# Patient Record
Sex: Female | Born: 1965
Health system: Southern US, Community
[De-identification: ages and names within clinical notes are randomized; demographics above are authoritative.]

## PROBLEM LIST (undated history)

## (undated) DIAGNOSIS — G709 Myoneural disorder, unspecified: Secondary | ICD-10-CM

## (undated) DIAGNOSIS — R87619 Unspecified abnormal cytological findings in specimens from cervix uteri: Secondary | ICD-10-CM

## (undated) DIAGNOSIS — IMO0002 Reserved for concepts with insufficient information to code with codable children: Secondary | ICD-10-CM

## (undated) DIAGNOSIS — Z8619 Personal history of other infectious and parasitic diseases: Secondary | ICD-10-CM

## (undated) DIAGNOSIS — B379 Candidiasis, unspecified: Secondary | ICD-10-CM

## (undated) DIAGNOSIS — A749 Chlamydial infection, unspecified: Secondary | ICD-10-CM

## (undated) DIAGNOSIS — B009 Herpesviral infection, unspecified: Secondary | ICD-10-CM

## (undated) DIAGNOSIS — M1990 Unspecified osteoarthritis, unspecified site: Secondary | ICD-10-CM

## (undated) DIAGNOSIS — G35 Multiple sclerosis: Secondary | ICD-10-CM

## (undated) HISTORY — DX: Myoneural disorder, unspecified: G70.9

## (undated) HISTORY — PX: INCISION AND DRAINAGE ABSCESS: SHX5864

## (undated) HISTORY — PX: TUBAL LIGATION: SHX77

## (undated) HISTORY — DX: Personal history of other infectious and parasitic diseases: Z86.19

## (undated) HISTORY — PX: CARPAL TUNNEL RELEASE: SHX101

## (undated) HISTORY — DX: Herpesviral infection, unspecified: B00.9

## (undated) HISTORY — DX: Candidiasis, unspecified: B37.9

## (undated) HISTORY — DX: Reserved for concepts with insufficient information to code with codable children: IMO0002

## (undated) HISTORY — DX: Chlamydial infection, unspecified: A74.9

## (undated) HISTORY — DX: Multiple sclerosis: G35

## (undated) HISTORY — DX: Unspecified abnormal cytological findings in specimens from cervix uteri: R87.619

---

## 1997-07-19 ENCOUNTER — Ambulatory Visit (HOSPITAL_COMMUNITY): Admission: RE | Admit: 1997-07-19 | Discharge: 1997-07-19 | Payer: Self-pay | Admitting: *Deleted

## 1998-01-11 ENCOUNTER — Ambulatory Visit (HOSPITAL_COMMUNITY): Admission: RE | Admit: 1998-01-11 | Discharge: 1998-01-11 | Payer: Self-pay | Admitting: *Deleted

## 1998-08-18 ENCOUNTER — Other Ambulatory Visit: Admission: RE | Admit: 1998-08-18 | Discharge: 1998-08-18 | Payer: Self-pay | Admitting: *Deleted

## 1999-08-21 ENCOUNTER — Other Ambulatory Visit: Admission: RE | Admit: 1999-08-21 | Discharge: 1999-08-21 | Payer: Self-pay | Admitting: *Deleted

## 2000-09-16 ENCOUNTER — Other Ambulatory Visit: Admission: RE | Admit: 2000-09-16 | Discharge: 2000-09-16 | Payer: Self-pay | Admitting: *Deleted

## 2000-11-07 ENCOUNTER — Other Ambulatory Visit: Admission: RE | Admit: 2000-11-07 | Discharge: 2000-11-07 | Payer: Self-pay | Admitting: *Deleted

## 2000-12-18 ENCOUNTER — Other Ambulatory Visit: Admission: RE | Admit: 2000-12-18 | Discharge: 2000-12-18 | Payer: Self-pay | Admitting: *Deleted

## 2003-10-27 ENCOUNTER — Inpatient Hospital Stay (HOSPITAL_COMMUNITY): Admission: RE | Admit: 2003-10-27 | Discharge: 2003-10-27 | Payer: Self-pay | Admitting: Obstetrics and Gynecology

## 2003-11-18 ENCOUNTER — Ambulatory Visit (HOSPITAL_COMMUNITY): Admission: RE | Admit: 2003-11-18 | Discharge: 2003-11-18 | Payer: Self-pay | Admitting: Obstetrics and Gynecology

## 2004-01-04 ENCOUNTER — Ambulatory Visit (HOSPITAL_COMMUNITY): Admission: RE | Admit: 2004-01-04 | Discharge: 2004-01-04 | Payer: Self-pay | Admitting: Obstetrics and Gynecology

## 2004-01-21 ENCOUNTER — Inpatient Hospital Stay (HOSPITAL_COMMUNITY): Admission: RE | Admit: 2004-01-21 | Discharge: 2004-01-21 | Payer: Self-pay | Admitting: Obstetrics and Gynecology

## 2004-01-23 ENCOUNTER — Inpatient Hospital Stay (HOSPITAL_COMMUNITY): Admission: RE | Admit: 2004-01-23 | Discharge: 2004-01-27 | Payer: Self-pay | Admitting: Obstetrics and Gynecology

## 2004-02-17 ENCOUNTER — Ambulatory Visit (HOSPITAL_COMMUNITY): Admission: RE | Admit: 2004-02-17 | Discharge: 2004-02-17 | Payer: Self-pay | Admitting: Obstetrics and Gynecology

## 2004-02-24 ENCOUNTER — Ambulatory Visit (HOSPITAL_COMMUNITY): Admission: RE | Admit: 2004-02-24 | Discharge: 2004-02-24 | Payer: Self-pay | Admitting: Obstetrics and Gynecology

## 2004-03-02 ENCOUNTER — Ambulatory Visit (HOSPITAL_COMMUNITY): Admission: RE | Admit: 2004-03-02 | Discharge: 2004-03-02 | Payer: Self-pay | Admitting: Obstetrics and Gynecology

## 2004-03-16 ENCOUNTER — Ambulatory Visit (HOSPITAL_COMMUNITY): Admission: RE | Admit: 2004-03-16 | Discharge: 2004-03-16 | Payer: Self-pay | Admitting: Obstetrics and Gynecology

## 2004-04-01 ENCOUNTER — Inpatient Hospital Stay (HOSPITAL_COMMUNITY): Admission: AD | Admit: 2004-04-01 | Discharge: 2004-04-04 | Payer: Self-pay | Admitting: Obstetrics and Gynecology

## 2004-05-15 ENCOUNTER — Other Ambulatory Visit: Admission: RE | Admit: 2004-05-15 | Discharge: 2004-05-15 | Payer: Self-pay | Admitting: Obstetrics and Gynecology

## 2004-08-06 ENCOUNTER — Encounter: Admission: RE | Admit: 2004-08-06 | Discharge: 2004-08-06 | Payer: Self-pay | Admitting: Neurology

## 2004-11-11 ENCOUNTER — Emergency Department (HOSPITAL_COMMUNITY): Admission: EM | Admit: 2004-11-11 | Discharge: 2004-11-11 | Payer: Self-pay | Admitting: Emergency Medicine

## 2004-11-24 ENCOUNTER — Encounter: Admission: RE | Admit: 2004-11-24 | Discharge: 2004-12-19 | Payer: Self-pay | Admitting: Orthopedic Surgery

## 2004-12-26 ENCOUNTER — Encounter: Admission: RE | Admit: 2004-12-26 | Discharge: 2005-02-09 | Payer: Self-pay | Admitting: Orthopedic Surgery

## 2005-05-29 ENCOUNTER — Other Ambulatory Visit: Admission: RE | Admit: 2005-05-29 | Discharge: 2005-05-29 | Payer: Self-pay | Admitting: Obstetrics and Gynecology

## 2005-06-12 ENCOUNTER — Encounter: Admission: RE | Admit: 2005-06-12 | Discharge: 2005-06-12 | Payer: Self-pay | Admitting: Internal Medicine

## 2005-10-10 IMAGING — US US FETAL BPP W/O NONSTRESS
1 series · 13 of 28 positions shown · non-contrast
Comparison: none

CLINICAL DATA: Assess growth and fetal well-being.

[Series 1: us fetal bpp w/o nonstress · 0.33mm/px · 13 of 55 slices shown]
[im 3/55]
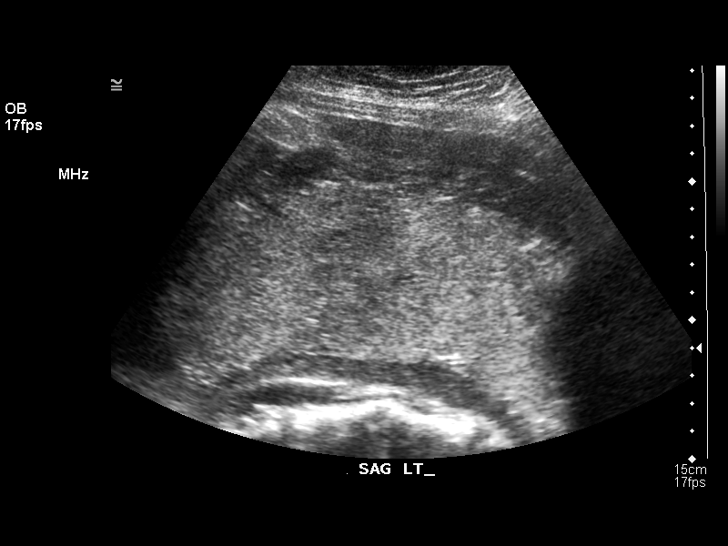
[im 7/55]
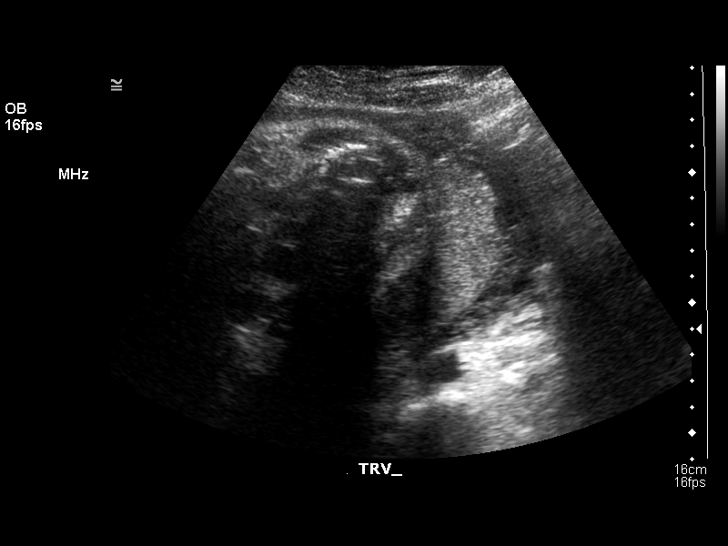
[im 11/55]
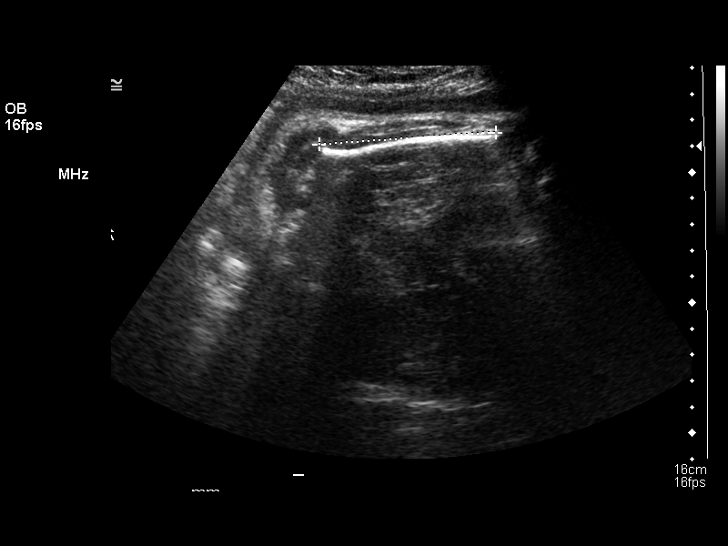
[im 15/55]
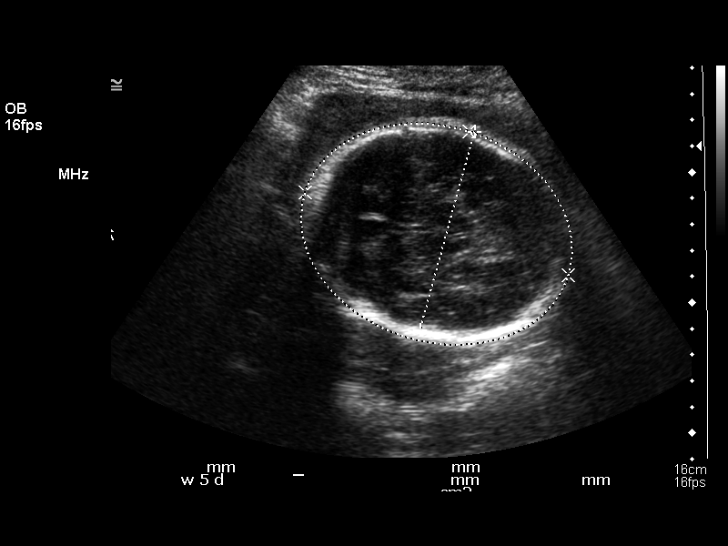
[im 19/55]
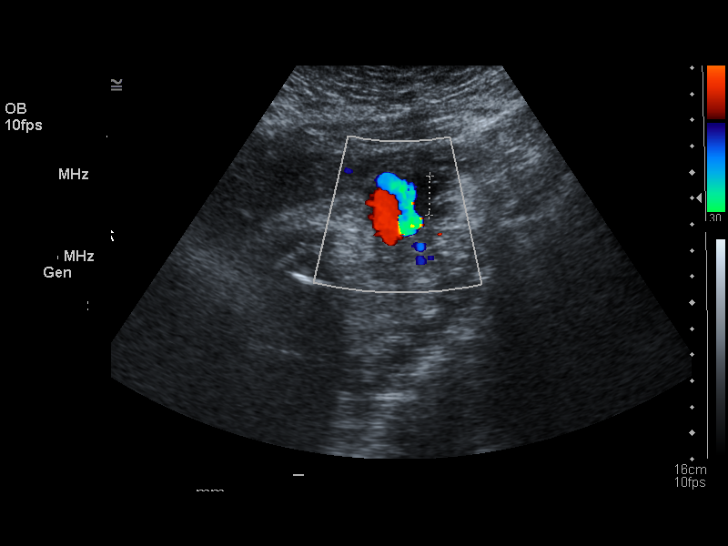
[im 23/55]
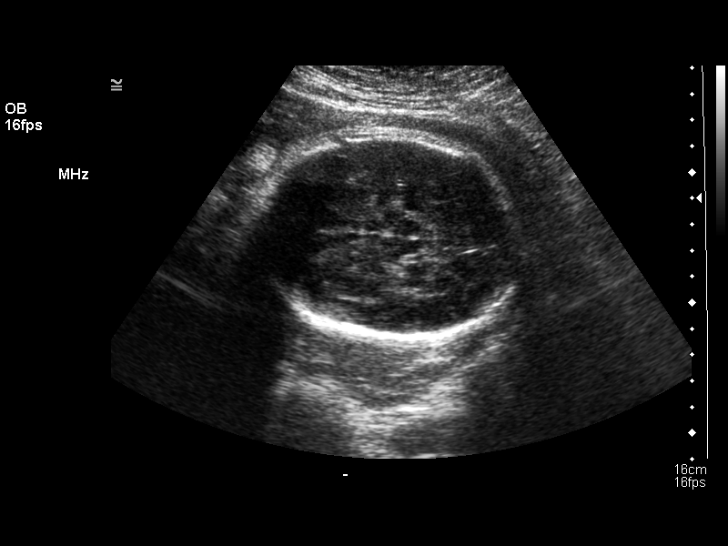
[im 29/55]
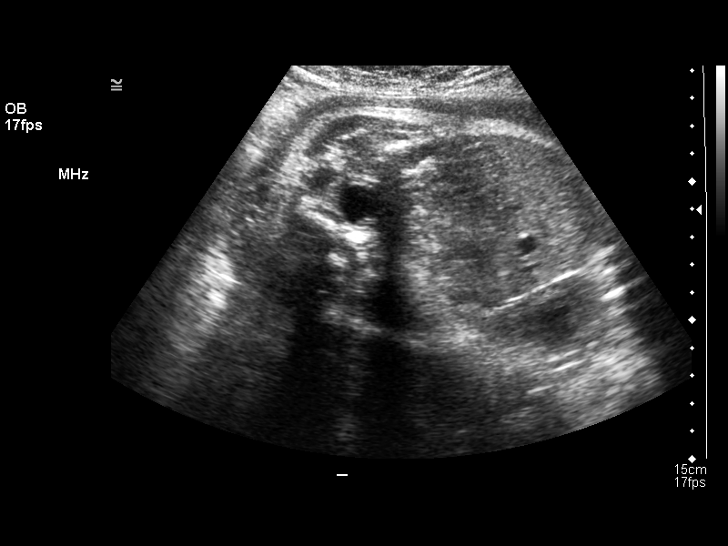
[im 33/55]
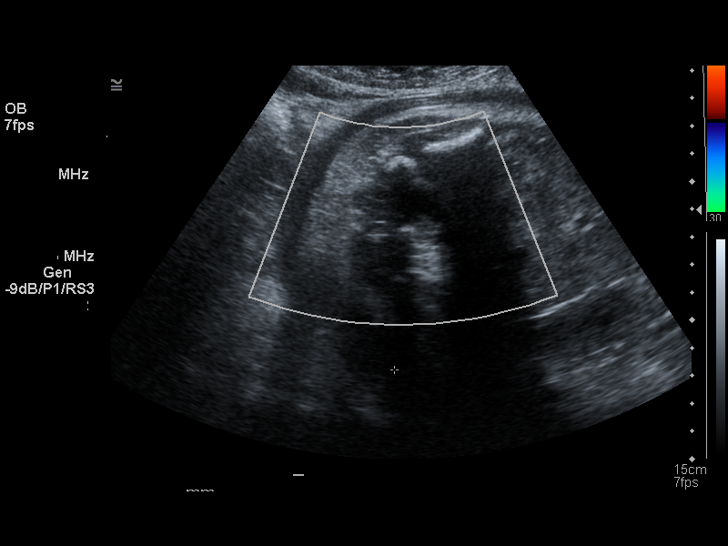
[im 37/55]
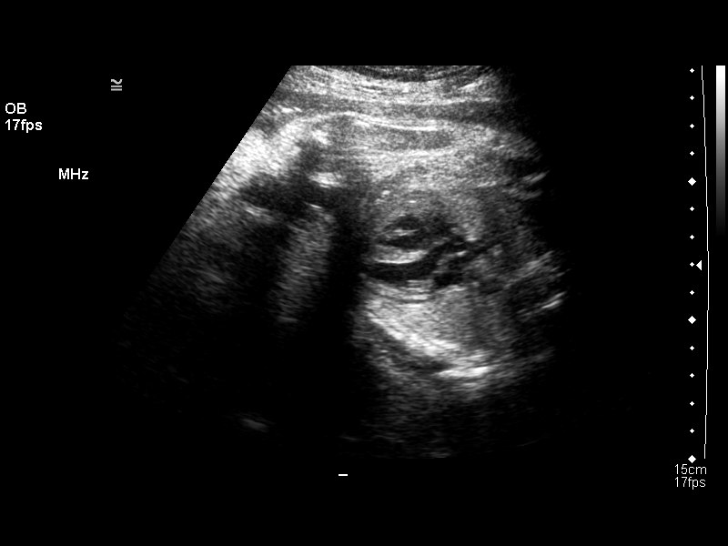
[im 41/55]
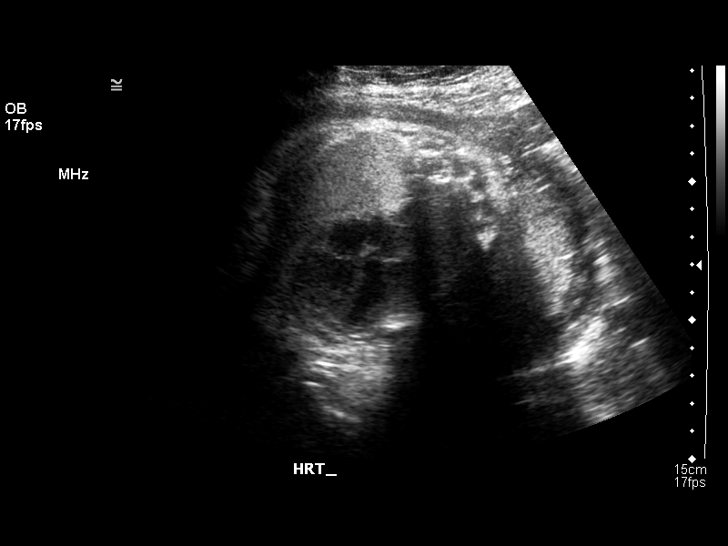
[im 45/55]
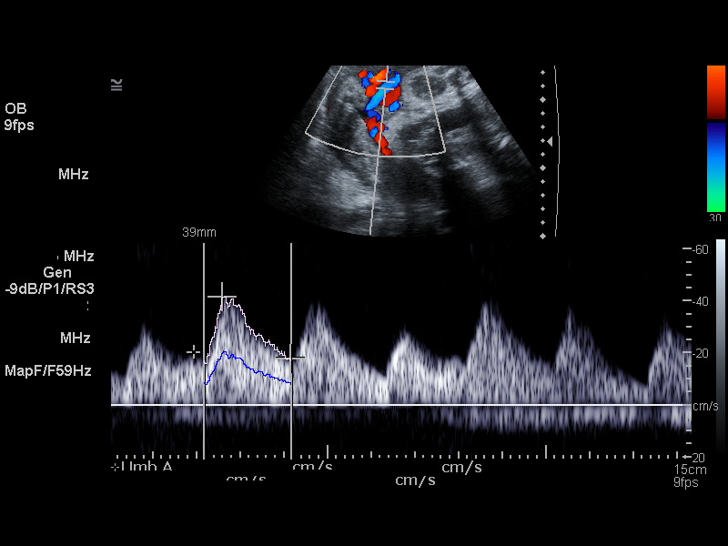
[im 49/55]
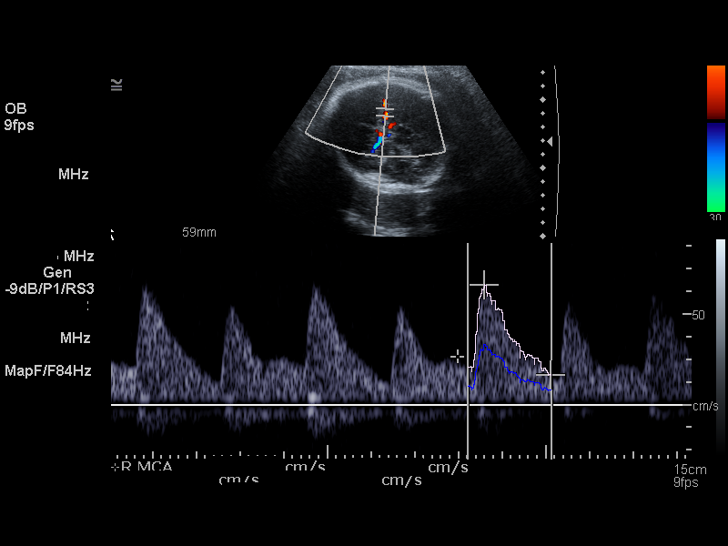
[im 53/55]
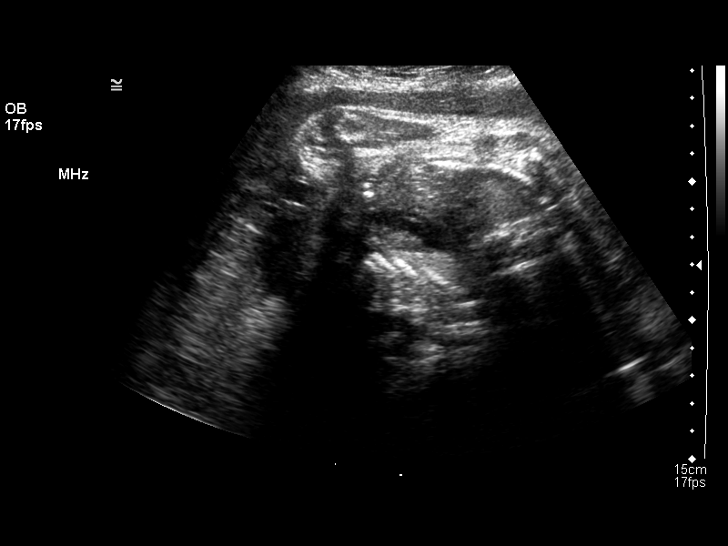

[13 of 28 positions shown; findings below may reference images not displayed]

OBSTETRICAL ULTRASOUND RE-EVALUATION:
Number of Fetuses: 1
Heart Rate:  141
Movement:  Yes
Breathing:  Yes
Presentation:  Cephalic
Placental Location:  Posterior
Grade:  II
Previa:  No
Amniotic Fluid (subjective):  Decreased
Amniotic Fluid (objective):  4.2 cm AFI (5th -95th%ile = 8.5 ? 24.5 cm for 33 wks)

FETAL BIOMETRY
BPD:  7.8 cm   31 w 4 d
HC:  29.6 cm   32 w 6 d
AC:  28.9 cm  32 w 6 d
FL:  6.7 cm   34 w 6 d

Mean GA:  33 w 0 d
Assigned GA:  32 w 4 d
Fetal indices are within normal limits.
EFW:  2262 g (H) 75th ? 90th%ile (7673 ? 4147 g) for 33 wks

FETAL ANATOMY
Lateral Ventricles:  Visualized 
Thalami/CSP:  Visualized 
Posterior Fossa:  Visualized 
Nuchal Region:  Previously seen  
Spine:  Previously seen 
4 Chamber Heart on Left:  Visualized 
Stomach on Left:  Visualized 
3 Vessel Cord:  Previously seen 
Cord Insertion Site:  Previously seen 
Kidneys:  Visualized 
Bladder:  Visualized 
Extremities:  Previously seen 

MATERNAL UTERINE AND ADNEXAL FINDINGS
Cervix:  3.5 cm Transabdominally

BIOPHYSICAL PROFILE

Movement:  2    Time:  25 minutes
Breathing:  2
Tone:  
Amniotic Fluid:  2

Total Score:  8

DOPPLER ULTRASOUND OF FETUS:

Umbilical Artery S/D Ratio:  2.26    (NL< 3.70)

Middle Cerebral Artery PI: 1.61     (NL> 1.40)
IMPRESSION: 1.  Single intrauterine pregnancy demonstrating an estimated gestational age by ultrasound 33 weeks 0 days.  Correlation with expected estimated gestational age by LMP of 32 weeks and 4 days suggests appropriate growth.  Currently the estimated fetal weight is between the 75th and 90th percentile for a 33 week gestation.
2.  Subjectively and quantitatively decreased amniotic fluid volume with an amniotic fluid index today measured at 4.2 cm (8.5 ? 24.5 cm).  Because of the low amniotic fluid volume with no clinical suspicion of ruptured membranes, Doppler evaluation was performed.
3.  Biophysical profile score [DATE].  
4.  Normal umbilical artery and middle cerebral artery Doppler assessment.  No absence or reversal of end diastolic flow was noted on any of the interrogated strips.  
5.  Normal cervical length.
6.    No late developing fetal anatomic abnormalities are seen associated with the lateral ventricles, four chamber heart, stomach, kidneys or bladder.

## 2005-10-17 IMAGING — US US FETAL BPP W/O NONSTRESS
1 series · 13 of 21 positions shown · non-contrast
Comparison: none

CLINICAL DATA: Oligohydramnios of unknown etiology.  Assess fetal well being and amniotic fluid volume.

[Series 1: us fetal bpp w/o nonstress · 0.47mm/px · 13 of 21 slices shown]
[im 1/21]
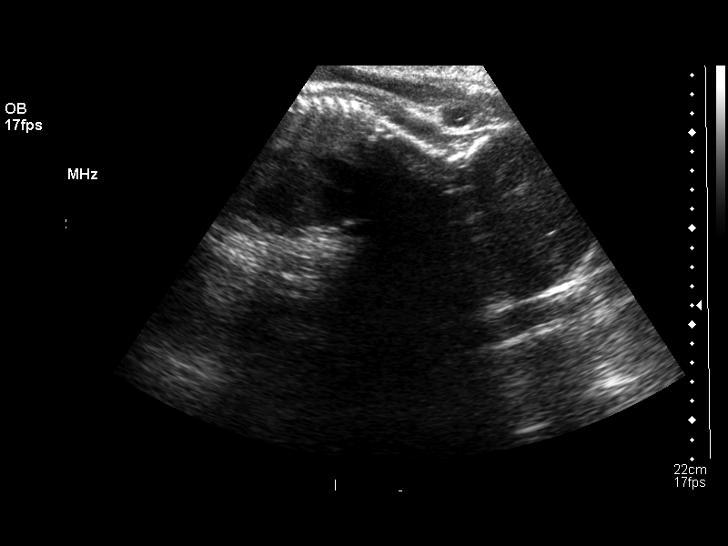
[im 3/21]
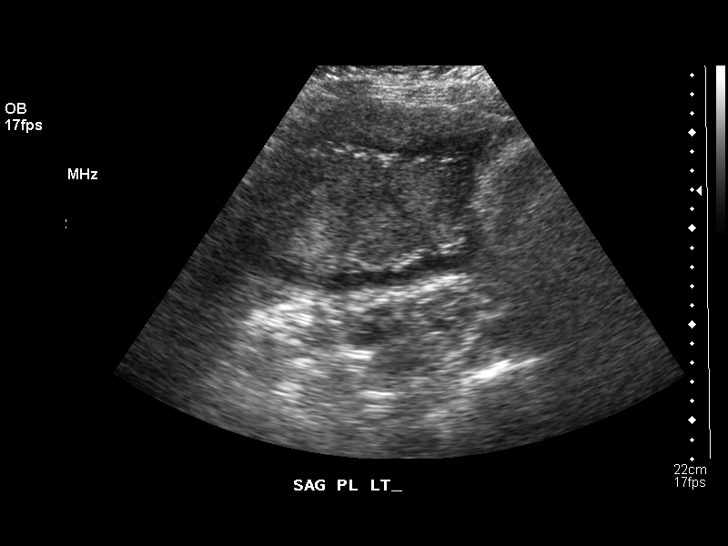
[im 5/21]
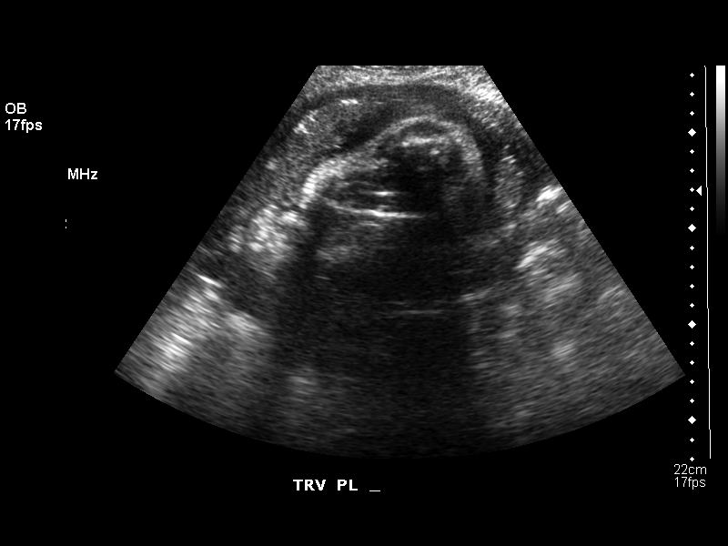
[im 6/21]
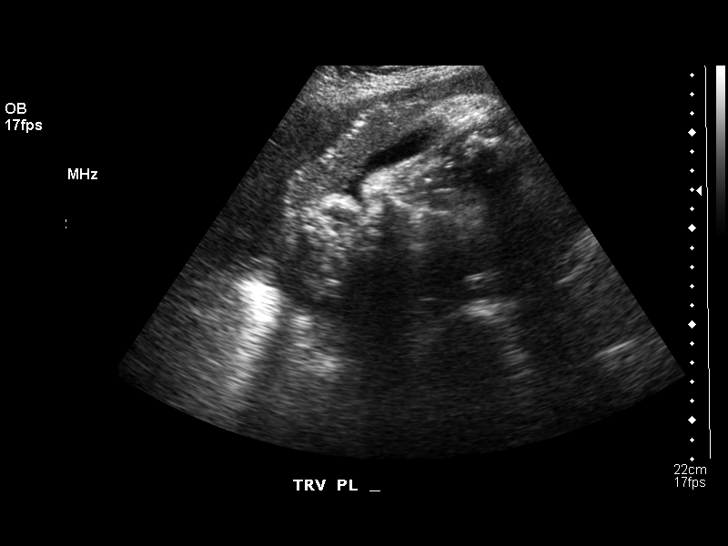
[im 8/21]
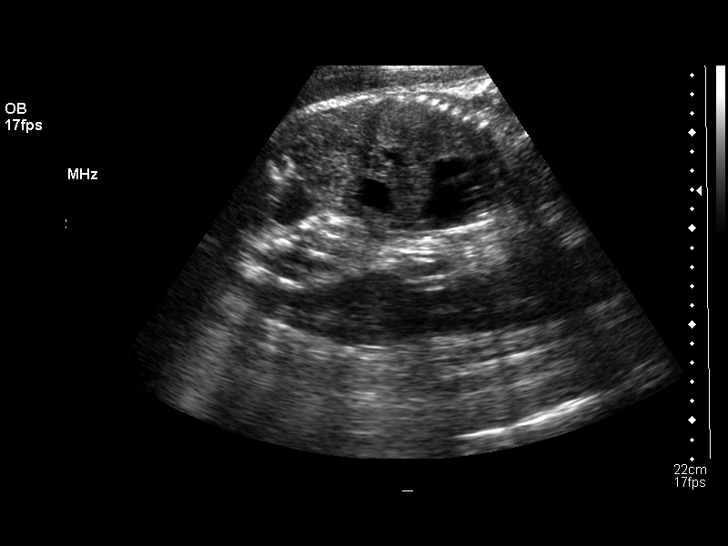
[im 9/21]
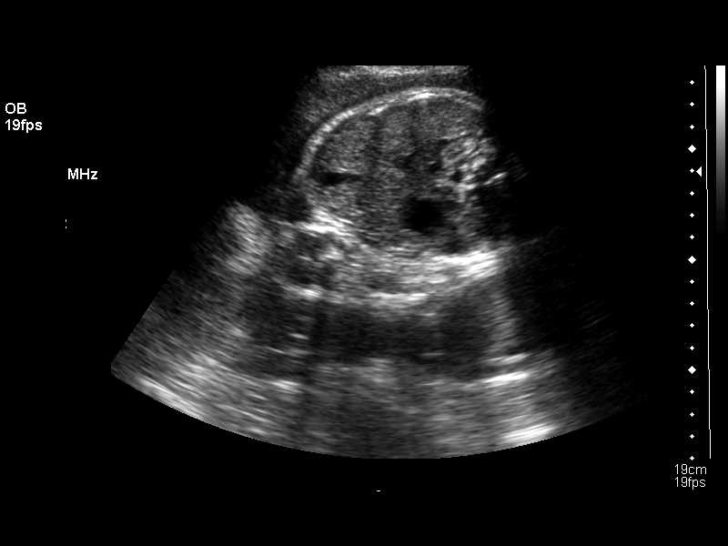
[im 11/21]
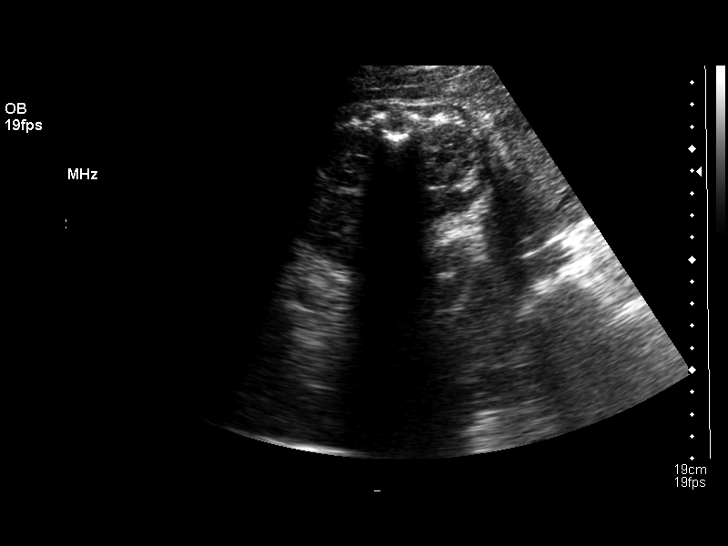
[im 13/21]
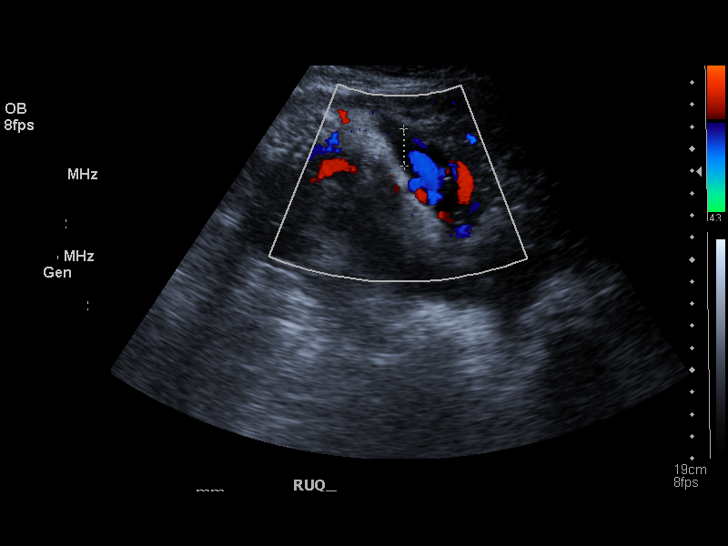
[im 14/21]
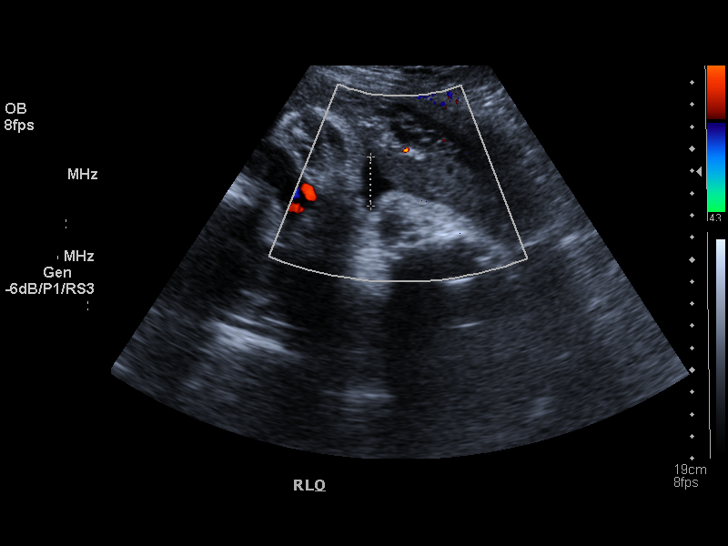
[im 16/21]
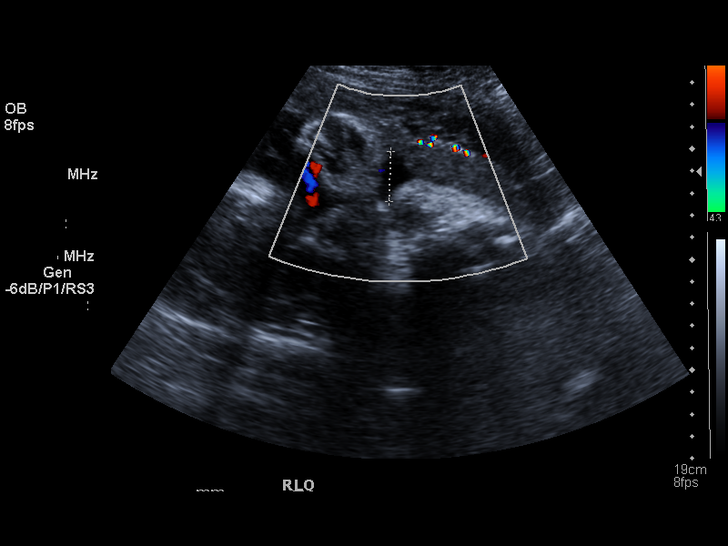
[im 17/21]
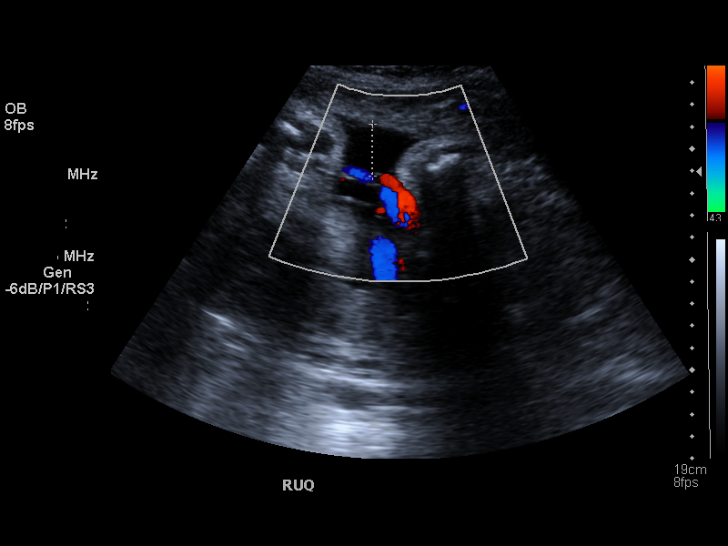
[im 19/21]
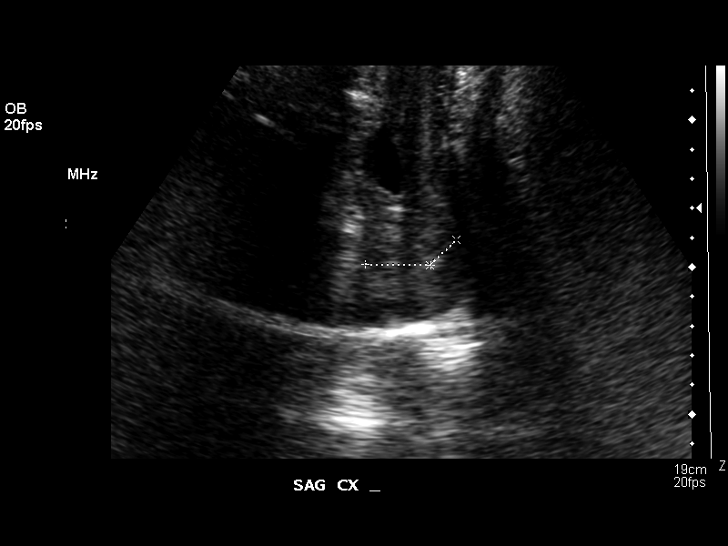
[im 21/21]
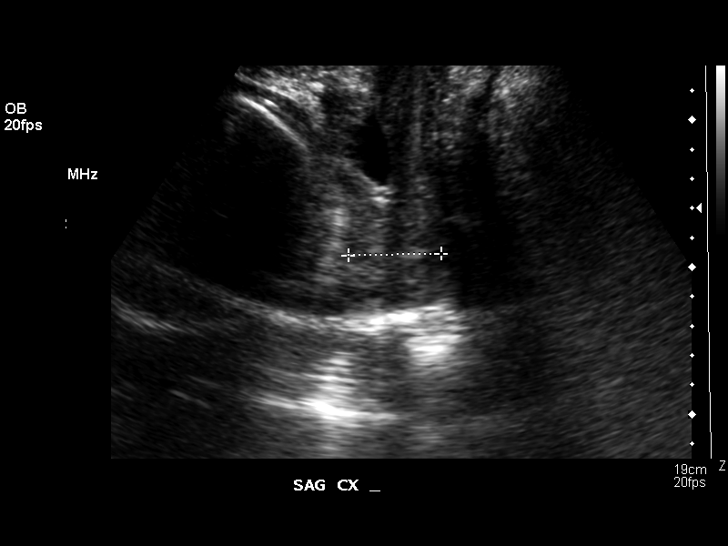

[13 of 21 positions shown; findings below may reference images not displayed]

BIOPHYSICAL PROFILE

Number of Fetuses: 1
Heart rate: 153 bpm
Movement: yes
Breathing: yes
Presentation: vertex
Placental Location: anterior, posterior. Noted is an anterior succurinate lobe.
Grade: II
Previa: no
Amniotic Fluid (Subjective): decreased
Amniotic Fluid (Objective): AFI 7.0 cm (2th-72th %ile = 7.9 to 24.9 cm for 35 weeks) 

Fetal measurements and complete anatomic evaluation were not requested.  The following fetal anatomy was visualized on this exam: stomach, kidneys, bladder, and diaphragm.

BPP SCORING
Movements:  2  Time:  15 minutes
Breathing:  2
Tone:  2
Amniotic Fluid:  2
Total Score:  8

MATERNAL UTERINE AND ADNEXAL FINDINGS
Cervix: 3.3 cm translabially
IMPRESSION: 1.  Subjectively and quantitatively decreased amniotic fluid volume.  Today, the amniotic fluid index measures 7 cm and this represents an interval improvement in overall fluid volume.  On the previous exam of 02/24/04, the amniotic fluid volume measured 4.2 cm.  
2.  Biophysical profile score [DATE].
3.  Normal cervical length.
4.  No late-developing fetal anatomic abnormalities are seen associated with the stomach, kidneys, or bladder.  Four chamber heart view and lateral ventricles could not be assessed due to positioning.

5. Anterior succurinate placental lobe.

## 2005-10-31 IMAGING — US US OB FOLLOW-UP
1 series · 13 of 21 positions shown · non-contrast
Comparison: none

CLINICAL DATA: 36 week 6 day assigned gestational age.  Follow-up oligohydramnios.  Evaluate fetal growth and biophysical profile.

[Series 1: us ob follow-up · 0.43mm/px · 13 of 21 slices shown]
[im 1/21]
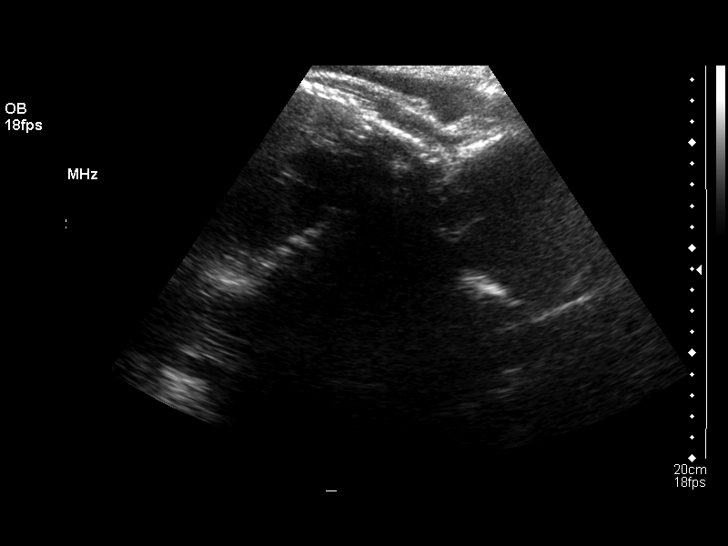
[im 3/21]
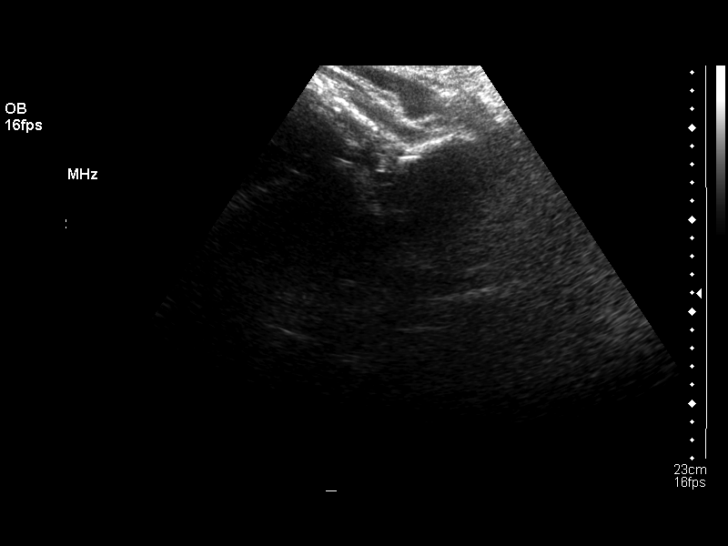
[im 5/21]
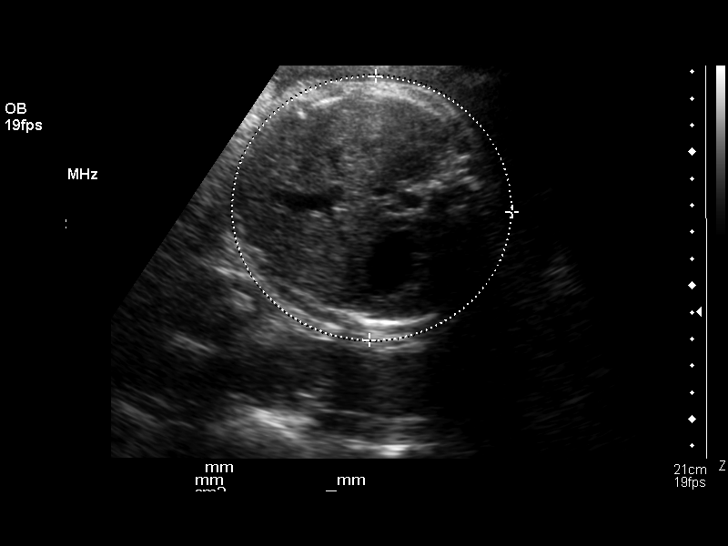
[im 6/21]
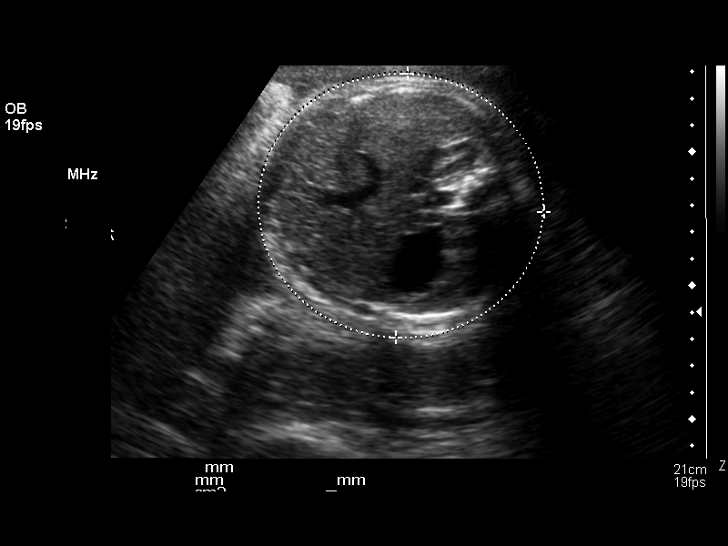
[im 8/21]
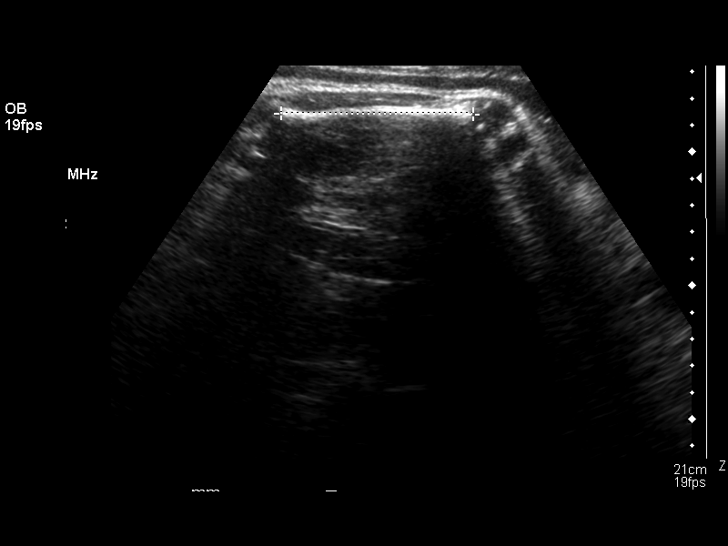
[im 9/21]
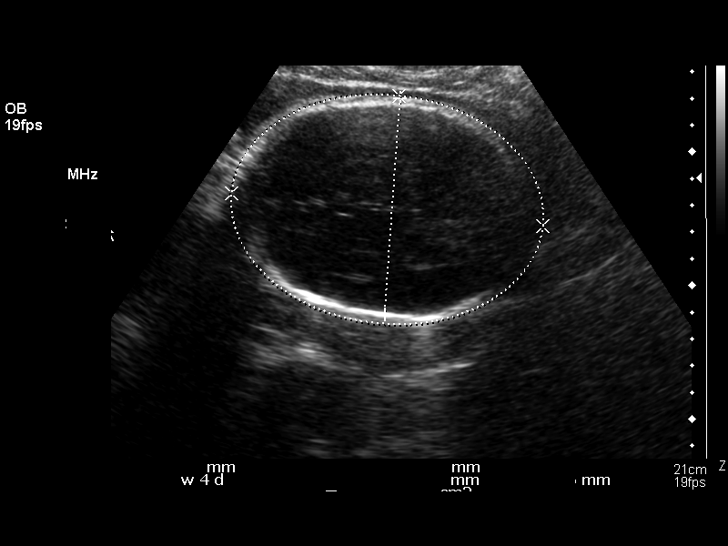
[im 11/21]
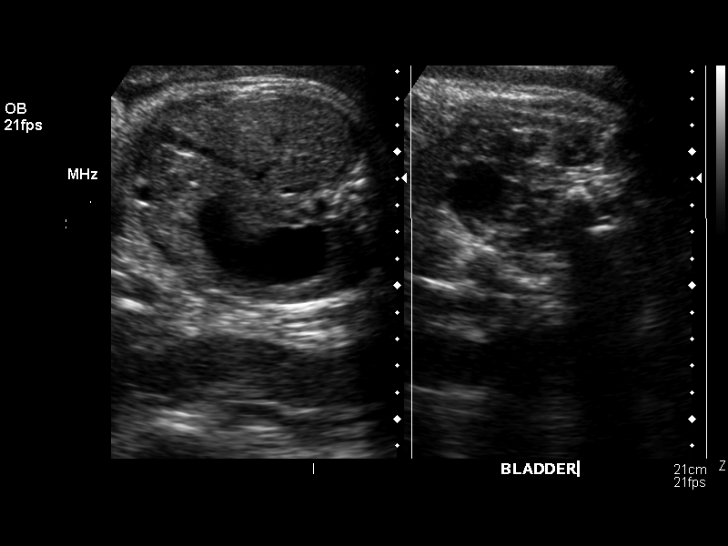
[im 13/21]
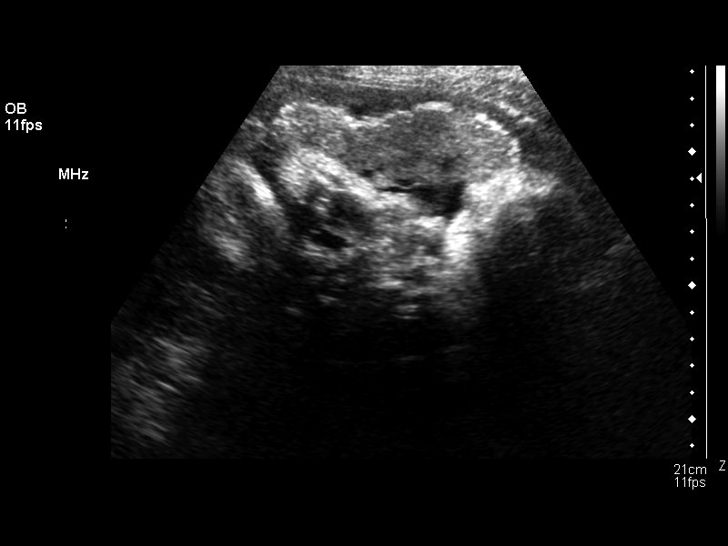
[im 14/21]
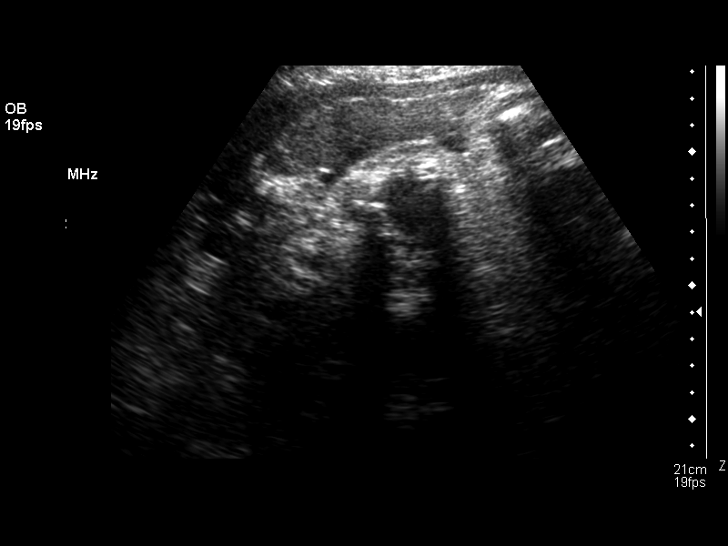
[im 16/21]
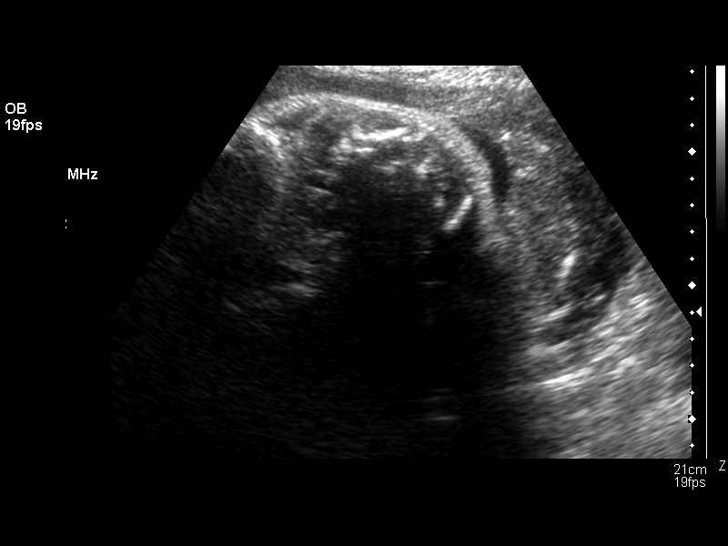
[im 17/21]
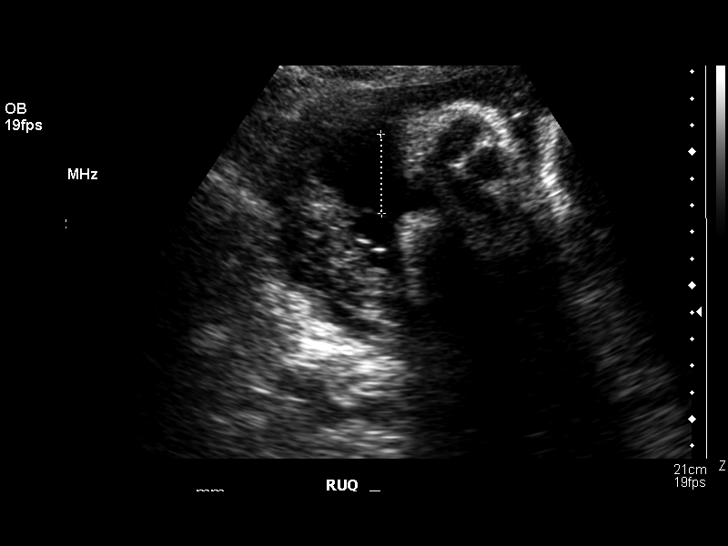
[im 19/21]
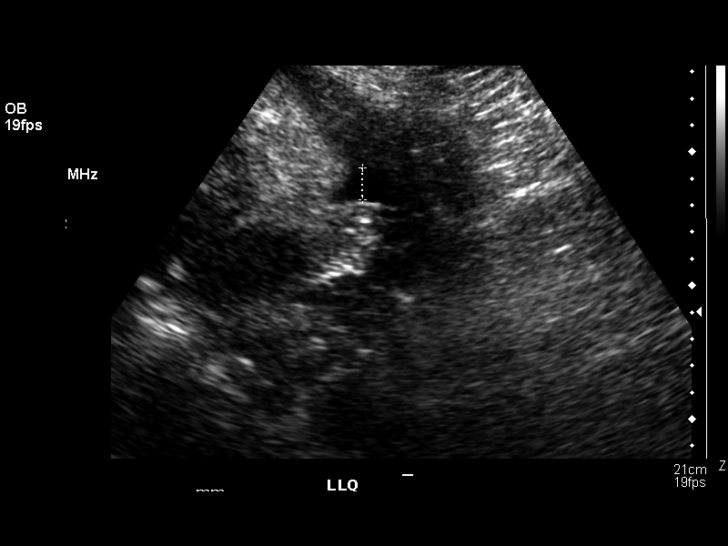
[im 21/21]
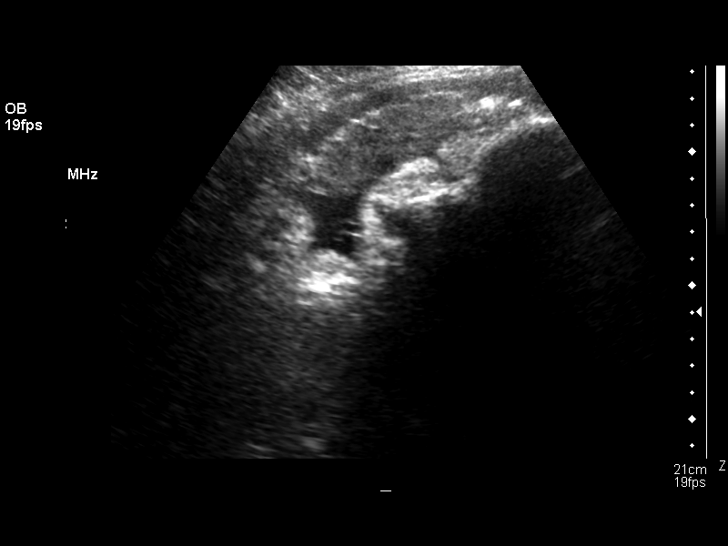

[13 of 21 positions shown; findings below may reference images not displayed]

OBSTETRICAL ULTRASOUND RE-EVALUATION:
 Number of Fetuses: 1
 Heart Rate:  153
 Movement:  Yes
 Breathing:  Yes
 Presentation:  Cephalic
 Placental Location:  Anterior/posterior
 Grade:  II
 Previa:  No
 Amniotic Fluid (subjective):  Decreased
 Amniotic Fluid (objective):  8.8 cm AFI (5th -95th%ile =   7.5 – 24.4 cm for 37 wks)

 FETAL BIOMETRY
 BPD:  8.1 cm   32 w 3 d
 HC:  32.0 cm   36 w 1 d
 AC:  32.4 cm   36 w 2 d
 FL:  7.2 cm   36 w 5 d

 Mean GA:  35 w 3 d
 Assigned GA:  36 w 6 d

 EFW:  3393 g (H) 50th – 75th%ile (9645 = 0544 g) For 37 wks

 FETAL ANATOMY
 Lateral Ventricles:  Previously seen 
 Thalami/CSP:  Previously seen 
 Posterior Fossa:  Previously seen 
 Nuchal Region:  Previously seen 
 Spine:  Previously seen 
 4 Chamber Heart on Left:  Previously seen 
 Stomach on Left:  Visualized 
 3 Vessel Cord:  Previously seen 
 Cord Insertion Site:  Previously seen 
 Kidneys:  Visualized 
 Bladder:  Visualized 
 Extremities:  Previously seen 

 MATERNAL UTERINE AND ADNEXAL FINDINGS
 Cervix:  Not evaluated 
 Again noted is an succenturiate lobe of the placenta along the anterior uterine wall.
IMPRESSION: 1.  Assigned gestational age is currently 36 weeks 6 days.  Appropriate fetal growth, with EFW slightly greater than 50th percentile.  
 2. Amniotic fluid volume remains subjectively decreased, with AFI of 8.8 cm on today’s study.  This shows little change since prior study on 03/02/04.
 3.  Anterior succenturiate lobe of the placenta again noted.

 BIOPHYSICAL PROFILE

 Movement:  2    Time:  15 minutes
 Breathing:  2
 Tone:  2
 Amniotic Fluid:  2

 Total Score:  8
IMPRESSION: Biophysical profile score [DATE].

## 2005-11-17 ENCOUNTER — Emergency Department (HOSPITAL_COMMUNITY): Admission: EM | Admit: 2005-11-17 | Discharge: 2005-11-17 | Payer: Self-pay | Admitting: Emergency Medicine

## 2010-02-11 ENCOUNTER — Encounter: Payer: Self-pay | Admitting: Family Medicine

## 2010-02-11 ENCOUNTER — Encounter: Payer: Self-pay | Admitting: Obstetrics and Gynecology

## 2010-06-26 ENCOUNTER — Other Ambulatory Visit: Payer: Self-pay | Admitting: Internal Medicine

## 2010-06-26 ENCOUNTER — Ambulatory Visit
Admission: RE | Admit: 2010-06-26 | Discharge: 2010-06-26 | Disposition: A | Discharge status: Home / Self Care | Payer: Medicaid Other | Source: Ambulatory Visit | Attending: Internal Medicine | Admitting: Internal Medicine

## 2010-06-26 DIAGNOSIS — R52 Pain, unspecified: Secondary | ICD-10-CM

## 2011-06-22 DIAGNOSIS — IMO0002 Reserved for concepts with insufficient information to code with codable children: Secondary | ICD-10-CM

## 2011-06-22 DIAGNOSIS — B009 Herpesviral infection, unspecified: Secondary | ICD-10-CM | POA: Insufficient documentation

## 2011-06-22 DIAGNOSIS — R87619 Unspecified abnormal cytological findings in specimens from cervix uteri: Secondary | ICD-10-CM | POA: Insufficient documentation

## 2011-06-25 ENCOUNTER — Encounter: Payer: Self-pay | Admitting: Obstetrics and Gynecology

## 2011-06-25 ENCOUNTER — Ambulatory Visit (INDEPENDENT_AMBULATORY_CARE_PROVIDER_SITE_OTHER): Payer: Medicaid Other | Admitting: Obstetrics and Gynecology

## 2011-06-25 VITALS — BP 118/64 | Ht 64.0 in | Wt 139.0 lb

## 2011-06-25 DIAGNOSIS — Z0000 Encounter for general adult medical examination without abnormal findings: Secondary | ICD-10-CM

## 2011-06-25 DIAGNOSIS — Z01419 Encounter for gynecological examination (general) (routine) without abnormal findings: Secondary | ICD-10-CM

## 2011-06-25 DIAGNOSIS — Z124 Encounter for screening for malignant neoplasm of cervix: Secondary | ICD-10-CM

## 2011-06-25 DIAGNOSIS — R350 Frequency of micturition: Secondary | ICD-10-CM

## 2011-06-25 MED ORDER — MEDROXYPROGESTERONE ACETATE 10 MG PO TABS: 10.0000 mg | ORAL_TABLET | Freq: Every day | ORAL | Status: DC

## 2011-06-25 MED ORDER — VALACYCLOVIR HCL 500 MG PO TABS: 500.0000 mg | ORAL_TABLET | Freq: Every day | ORAL | Status: DC

## 2011-06-25 MED ORDER — ESTRADIOL 0.1 MG/GM VA CREA: 1.0000 g | TOPICAL_CREAM | Freq: Every day | VAGINAL | Status: DC

## 2011-06-25 NOTE — Progress Notes (Signed)
The patient is not taking hormone replacement therapy The patient  is taking a Calcium supplement. Post-menopausal bleeding:no  Last Pap: approximate date 05/2010 and was normal  Last mammogram: approximate date 2013 and was normal  Last DEXA scan : N/A   Urinary symptoms: none Normal bowel movements: Yes Reports abuse at home: No:   C/o urinary urgency and frequency.  Also complains of vaginal dryness.  She liked the estrogen vaginal cream when she used it previously.  Filed Vitals:   06/25/11 1501  BP: 118/64   ROS: noncontributory  Physical Examination: General appearance - alert, well appearing, and in no distress Neck - supple, no significant adenopathy Chest - clear to auscultation, no wheezes, rales or rhonchi, symmetric air entry Heart - normal rate and regular rhythm Abdomen - soft, nontender, nondistended, no masses or organomegaly Breasts - breasts appear normal, no suspicious masses, no skin or nipple changes or axillary nodes Pelvic - normal external genitalia, vulva, vagina, cervix, uterus and adnexa Back exam - no CVAT Extremities - no edema, redness or tenderness in the calves or thighs  Assessment and plan Clean-catch urine culture Vaginal estrogen cream and Provera (q63mths) Kegel Exercises Pap today Return to office in 6 weeks

## 2011-06-28 LAB — PAP IG W/ RFLX HPV ASCU

## 2011-06-28 LAB — URINE CULTURE: Colony Count: >=100000

## 2011-07-17 ENCOUNTER — Telehealth: Payer: Self-pay

## 2011-07-17 DIAGNOSIS — N390 Urinary tract infection, site not specified: Secondary | ICD-10-CM | POA: Insufficient documentation

## 2011-07-17 MED ORDER — CIPROFLOXACIN HCL 250 MG PO TABS: 250.0000 mg | ORAL_TABLET | Freq: Two times a day (BID) | ORAL | Status: AC

## 2011-07-17 NOTE — Telephone Encounter (Signed)
LM for pt to cb re: test results. Naijah Lacek, Jacqueline A  

## 2011-07-17 NOTE — Telephone Encounter (Signed)
Spoke to pt to let her know about wnl pap and UTI +. Needs treatment with Ciprofloxacin 250 mg q 12 hrs x 3 days. Rx will be E- prescribed to [facility] [address]. Pt voiced understanding.  Melody Comas A

## 2011-08-06 ENCOUNTER — Ambulatory Visit (INDEPENDENT_AMBULATORY_CARE_PROVIDER_SITE_OTHER): Payer: Medicaid Other | Admitting: Obstetrics and Gynecology

## 2011-08-06 ENCOUNTER — Encounter: Payer: Self-pay | Admitting: Obstetrics and Gynecology

## 2011-08-06 VITALS — BP 120/70 | Resp 14 | Ht 65.0 in | Wt 137.0 lb

## 2011-08-06 DIAGNOSIS — R6889 Other general symptoms and signs: Secondary | ICD-10-CM

## 2011-08-06 DIAGNOSIS — IMO0002 Reserved for concepts with insufficient information to code with codable children: Secondary | ICD-10-CM

## 2011-08-06 DIAGNOSIS — N390 Urinary tract infection, site not specified: Secondary | ICD-10-CM

## 2011-08-06 LAB — POCT URINALYSIS DIPSTICK
Bilirubin, UA: NEGATIVE
Blood, UA: NEGATIVE
Glucose, UA: NEGATIVE
Leukocytes, UA: NEGATIVE
Nitrite, UA: NEGATIVE
Spec Grav, UA: 1.02
Urobilinogen, UA: 1
pH, UA: 5

## 2011-08-06 NOTE — Progress Notes (Signed)
Pt doing better since tx for UTI Wants to cont using vaginal estrogen and progesterone q18mths Will also cont Kegel exercises Will cont to observe H/o CIN I with ASC-H - neg pap 06/2011 - repeat pap at [facility]

## 2012-03-04 ENCOUNTER — Emergency Department (HOSPITAL_COMMUNITY): Payer: Worker's Compensation

## 2012-03-04 ENCOUNTER — Encounter (HOSPITAL_COMMUNITY): Payer: Self-pay | Admitting: Emergency Medicine

## 2012-03-04 ENCOUNTER — Emergency Department (HOSPITAL_COMMUNITY)
Admission: EM | Admit: 2012-03-04 | Discharge: 2012-03-05 | Disposition: A | Discharge status: Home / Self Care | Payer: Worker's Compensation | Attending: Emergency Medicine | Admitting: Emergency Medicine

## 2012-03-04 DIAGNOSIS — S8990XA Unspecified injury of unspecified lower leg, initial encounter: Secondary | ICD-10-CM | POA: Insufficient documentation

## 2012-03-04 DIAGNOSIS — Y990 Civilian activity done for income or pay: Secondary | ICD-10-CM | POA: Insufficient documentation

## 2012-03-04 DIAGNOSIS — S8992XA Unspecified injury of left lower leg, initial encounter: Secondary | ICD-10-CM

## 2012-03-04 DIAGNOSIS — Z8619 Personal history of other infectious and parasitic diseases: Secondary | ICD-10-CM | POA: Insufficient documentation

## 2012-03-04 DIAGNOSIS — S0180XA Unspecified open wound of other part of head, initial encounter: Secondary | ICD-10-CM | POA: Insufficient documentation

## 2012-03-04 DIAGNOSIS — Y9269 Other specified industrial and construction area as the place of occurrence of the external cause: Secondary | ICD-10-CM | POA: Insufficient documentation

## 2012-03-04 DIAGNOSIS — S0181XA Laceration without foreign body of other part of head, initial encounter: Secondary | ICD-10-CM

## 2012-03-04 DIAGNOSIS — W010XXA Fall on same level from slipping, tripping and stumbling without subsequent striking against object, initial encounter: Secondary | ICD-10-CM | POA: Insufficient documentation

## 2012-03-04 DIAGNOSIS — Z79899 Other long term (current) drug therapy: Secondary | ICD-10-CM | POA: Insufficient documentation

## 2012-03-04 DIAGNOSIS — S99929A Unspecified injury of unspecified foot, initial encounter: Secondary | ICD-10-CM | POA: Insufficient documentation

## 2012-03-04 MED ORDER — HYDROCODONE-ACETAMINOPHEN 5-325 MG PO TABS
1.0000 | ORAL_TABLET | Freq: Once | ORAL | Status: AC
  Administered 2012-03-04: 1 via ORAL
  Filled 2012-03-04: qty 1

## 2012-03-04 MED ORDER — LIDOCAINE-EPINEPHRINE 1 %-1:100000 IJ SOLN: 20.0000 mL | Freq: Once | INTRAMUSCULAR | Status: DC

## 2012-03-04 MED ORDER — TETANUS-DIPHTH-ACELL PERTUSSIS 5-2.5-18.5 LF-MCG/0.5 IM SUSP
0.5000 mL | Freq: Once | INTRAMUSCULAR | Status: AC
  Administered 2012-03-04: 0.5 mL via INTRAMUSCULAR
  Filled 2012-03-04: qty 0.5

## 2012-03-04 NOTE — ED Provider Notes (Signed)
History     CSN: [id number]  Arrival date & time 03/04/12  2146   First MD Initiated Contact with Patient 03/04/12 2248      Chief Complaint  Patient presents with  . Fall  . Laceration    (Consider location/radiation/quality/duration/timing/severity/associated sxs/prior treatment) HPI  47 year old female presents for evaluations of facial injury. 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 She denies any loss of consciousness but complaining of a laceration of the right side of her face, and pain in her left knee. Describe pain as a sharp throbbing sensation, 8/10, nonradiating. Incident happens 2 hours ago. She was able to ambulate afterward. She denies double vision, change in vision, pain with eye movement, neck pain, numbness or weakness. She does not recall her last tetanus shot. She is not on any blood thinner medication. She denies any other injury except of face and the left knee. No specific treatment tried.  Past Medical History  Diagnosis Date  . Abnormal Pap smear     ASC-H  . HSV infection   . Yeast infection   . Chlamydia   . History of chicken pox     Past Surgical History  Procedure Laterality Date  . Tubal ligation    . Cesarean section    . Carpal tunnel release      No family history on file.  History  Substance Use Topics  . Smoking status: Never Smoker   . Smokeless tobacco: Never Used  . Alcohol Use: Yes     Comment: occasional    OB History   Grav Para Term Preterm Abortions TAB SAB Ect Mult Living   3 2              Review of Systems  Constitutional:       10 Systems reviewed and all are negative for acute change except as noted in the HPI.     Allergies  Macrobid  Home Medications   Current Outpatient Rx  Name  Route  Sig  Dispense  Refill  . estradiol (ESTRACE VAGINAL) 0.1 MG/GM vaginal cream   Vaginal   Place 0.25  Applicatorfuls vaginally daily. For 2wks then 3x/wk for 2wks then 1x/wk.   42.5 g   3   . Fingolimod HCl (GILENYA PO)   Oral   Take by mouth.         . medroxyPROGESTERone (PROVERA) 10 MG tablet   Oral   Take 1 tablet (10 mg total) by mouth daily. For 2wks every .  May start every 1st day of the month every .   14 tablet   3   . Multiple Vitamin (MULTIVITAMIN) tablet   Oral   Take 1 tablet by mouth daily.         . valACYclovir (VALTREX) 500 MG tablet   Oral   Take 1 tablet (500 mg total) by mouth daily.   30 tablet   12     BP 126/73  Pulse 88  Temp(Src) 98.3 F (36.8 C) (Oral)  Resp 16  SpO2 96%  Physical Exam  Nursing note and vitals reviewed. Constitutional: She is oriented to person, place, and time. She appears well-developed and well-nourished. No distress.  HENT:  Head: Normocephalic.  Right Ear: External ear normal.  Left Ear: External ear normal.  Nose: Nose normal.  Tenderness to right zygomatic arch with mild edema without deformity noted. A 2  cm superficial laceration to right upper eyelid laterally without any eye involvement. No foreign object noted.  No racoon eyes, no hemotympanum, no septal hematoma, no malocclusion.  Eyes: Conjunctivae and EOM are normal. Pupils are equal, round, and reactive to light.  Extraocular movement is intact. No ecchymosis, no injection.  Neck: Normal range of motion. Neck supple.  Pulmonary/Chest: She exhibits no tenderness.  Abdominal: There is no tenderness.  Musculoskeletal: She exhibits tenderness (Tenderness to medial aspects of left knee on palpation with mild bruising noted. Normal flexion and extension. No deformity noted.). She exhibits no edema.  Neurological: She is alert and oriented to person, place, and time.  Skin: Skin is warm. No rash noted.  Psychiatric: She has a normal mood and affect.    ED Course  Procedures (including critical care time)  Results for orders placed in visit on  08/06/11  POCT URINALYSIS DIPSTICK      Result Value Range   Color, UA yellow     Clarity, UA clear     Glucose, UA neg     Bilirubin, UA neg     Ketones, UA trace     Spec Grav, UA 1.020     Blood, UA neg     pH, UA 5.0     Protein, UA trace     Urobilinogen, UA 1.0     Nitrite, UA neg     Leukocytes, UA Negative     Dg Knee Complete 4 Views Left  03/04/2012  *RADIOLOGY REPORT*  Clinical Data: Status post fall; medial left knee pain.  LEFT KNEE - COMPLETE 4+ VIEW  Comparison: None.  Findings: There is no evidence of fracture or dislocation.  The joint spaces are preserved.  No significant degenerative change is seen; the patellofemoral joint is grossly unremarkable in appearance.  No significant joint effusion is seen.  The visualized soft tissues are normal in appearance.  IMPRESSION: No evidence of fracture or dislocation.   Original Report Authenticated By: Tonia Ghent, M.D.    Ct Maxillofacial Wo Cm  03/04/2012  *RADIOLOGY REPORT*  Clinical Data: Status post fall; laceration above right eye. Concern for facial injury.  CT MAXILLOFACIAL WITHOUT CONTRAST  Technique:  Multidetector CT imaging of the maxillofacial structures was performed. Multiplanar CT image reconstructions were also generated.  Comparison: CT of the head performed 11/11/2004, and MRI of the brain performed 08/06/2004  Findings: There is no evidence of fracture or dislocation.  The maxilla and mandible appear intact.  The nasal bone is unremarkable in appearance.  The visualized dentition demonstrates no acute abnormality.  The orbits are intact bilaterally.  The visualized paranasal sinuses and mastoid air cells are well-aerated.  Soft tissue swelling is noted overlying the right maxilla, with a soft tissue laceration lateral to the right orbit.  The parapharyngeal fat planes are preserved.  The nasopharynx, oropharynx and hypopharynx are unremarkable in appearance.  The visualized portions of the valleculae and piriform  sinuses are grossly unremarkable.  The parotid and submandibular glands are within normal limits.  No cervical lymphadenopathy is seen.  IMPRESSION:  1.  No evidence of fracture or dislocation. 2.  Soft tissue swelling overlying the right maxilla, with a soft tissue laceration seen lateral to the right orbit.   Original Report Authenticated By: Tonia Ghent, M.D.    LACERATION REPAIR Performed by: Fayrene Helper Authorized by: Fayrene Helper Consent: Verbal consent obtained. Risks and benefits: risks, benefits and alternatives were discussed Consent given by: patient Patient identity confirmed: provided demographic data  Prepped and Draped in normal sterile fashion Wound explored  Laceration Location: R upper eyelid  Laceration Length: 2cm  No Foreign Bodies seen or palpated  Anesthesia: local infiltration  Local anesthetic: lidocaine 2% w epinephrine  Anesthetic total: 2 ml  Irrigation method: syringe Amount of cleaning: standard  Skin closure: 6.0  Number of sutures: 4  Technique: simple interrupted  Patient tolerance: Patient tolerated the procedure well with no immediate complications.   11:28 PM Patient had a mechanical fall when she tripped over a power cord. It appears that she has a superficial laceration overlying her right upper eyelid amenable for suture. She also had a small abrasion noted to her left knee. X-ray L knee was unremarkable. Patient able to ambulate. Tdap update here in ER.  Pain medication given.    12:17 AM Maxillofacial CT shows no evidence of fracture or dislocation. Laceration overlying right orbit has been successfully sutured by me. Patient recommended to return in 5 days for suture removal. Wound care instruction discussed. Otherwise patient stable for discharge. Work note offered as pt requested.    BP 126/73  Pulse 88  Temp(Src) 98.3 F (36.8 C) (Oral)  Resp 16  SpO2 96%  I have reviewed nursing notes and vital signs. I personally reviewed  the imaging tests through PACS system  I reviewed available ER/hospitalization records thought the EMR\  1. Fall 2. Facial laceration 3. Left knee injury MDM          Fayrene Helper, PA-C 03/05/12 0020

## 2012-03-04 NOTE — ED Notes (Signed)
PT. TRIPPED ON A CORD WHILE AT WORK ( [employer] )  AND FELL THIS EVENING , NO LOC , PRESENTS WITH LACERATION APPROX. 1 INCH AT RIGHT EYELID , NO LOC , AMBULATORY , PAIN AT RIGHT SIDE OF FACE AND LEFT KNEE PAIN .

## 2012-03-05 MED ORDER — HYDROCODONE-ACETAMINOPHEN 5-325 MG PO TABS: 1.0000 | ORAL_TABLET | ORAL | Status: DC | PRN

## 2012-03-05 NOTE — ED Provider Notes (Signed)
Medical screening examination/treatment/procedure(s) were performed by non-physician practitioner and as supervising physician I was immediately available for consultation/collaboration.  Hamda Klutts, MD 03/05/12 0527 

## 2012-03-09 ENCOUNTER — Emergency Department (HOSPITAL_COMMUNITY)
Admission: EM | Admit: 2012-03-09 | Discharge: 2012-03-09 | Disposition: A | Discharge status: Home / Self Care | Payer: Worker's Compensation | Attending: Emergency Medicine | Admitting: Emergency Medicine

## 2012-03-09 ENCOUNTER — Encounter (HOSPITAL_COMMUNITY): Payer: Self-pay | Admitting: Nurse Practitioner

## 2012-03-09 DIAGNOSIS — Z79899 Other long term (current) drug therapy: Secondary | ICD-10-CM | POA: Insufficient documentation

## 2012-03-09 DIAGNOSIS — Z4802 Encounter for removal of sutures: Secondary | ICD-10-CM

## 2012-03-09 DIAGNOSIS — Z8619 Personal history of other infectious and parasitic diseases: Secondary | ICD-10-CM | POA: Insufficient documentation

## 2012-03-09 NOTE — ED Provider Notes (Signed)
Medical screening examination/treatment/procedure(s) were performed by non-physician practitioner and as supervising physician I was immediately available for consultation/collaboration.  Vaiden Adames T Damariz Paganelli, MD 03/09/12 1511 

## 2012-03-09 NOTE — ED Notes (Signed)
Pt here for removal of 4 sutures placed to R eyebrow here Tuesday. States healing well and no complaints.

## 2012-03-09 NOTE — ED Notes (Signed)
Patient here for stable removal.  Right facial laceration.  Bruised otherwise CDI.

## 2012-03-09 NOTE — ED Provider Notes (Signed)
History     CSN: [id number]  Arrival date & time 03/09/12  1125   First MD Initiated Contact with Patient 03/09/12 1137      Chief Complaint  Patient presents with  . Suture / Staple Removal    (Consider location/radiation/quality/duration/timing/severity/associated sxs/prior treatment) Patient is a 47 y.o. female presenting with suture removal. The history is provided by the patient.  Suture / Staple Removal  The sutures were placed 3 to 6 days ago. Treatments since wound repair include antibiotic ointment use. There has been no drainage from the wound. There is no redness present. There is no swelling present. The pain has no pain.    Past Medical History  Diagnosis Date  . Abnormal Pap smear     ASC-H  . HSV infection   . Yeast infection   . Chlamydia   . History of chicken pox     Past Surgical History  Procedure Laterality Date  . Tubal ligation    . Cesarean section    . Carpal tunnel release      History reviewed. No pertinent family history.  History  Substance Use Topics  . Smoking status: Never Smoker   . Smokeless tobacco: Never Used  . Alcohol Use: Yes     Comment: occasional    OB History   Grav Para Term Preterm Abortions TAB SAB Ect Mult Living   3 2              Review of Systems  Constitutional: Negative for fever and chills.  HENT: Negative for facial swelling.   Respiratory: Negative for shortness of breath and wheezing.   Skin: Positive for wound.  Neurological: Negative for numbness.    Allergies  Macrobid  Home Medications   Current Outpatient Rx  Name  Route  Sig  Dispense  Refill  . Calcium Carbonate-Vitamin D (CALCIUM 600 + D PO)   Oral   Take 1 tablet by mouth daily.         Marland Kitchen estradiol (ESTRACE) 0.1 MG/GM vaginal cream   Vaginal   Place 0.25 g vaginally once a week. Saturday or Sunday         . Fingolimod HCl (GILENYA) 0.5 MG CAPS   Oral   Take 0.5 mg by mouth at bedtime.         Marland Kitchen  HYDROcodone-acetaminophen (NORCO/VICODIN) 5-325 MG per tablet   Oral   Take 1 tablet by mouth every 4 (four) hours as needed for pain.   10 tablet   0   . lidocaine (LIDODERM) 5 %   Transdermal   Place 1 patch onto the skin daily as needed. Remove & Discard patch within 12 hours or as directed by MD           BP 130/83  Pulse 73  Temp(Src) 98.2 F (36.8 C) (Oral)  Resp 15  SpO2 100%  Physical Exam  Constitutional: She is oriented to person, place, and time. She appears well-developed and well-nourished.  HENT:  Head: Normocephalic.  Cardiovascular: Normal rate.   Pulmonary/Chest: Effort normal.  Neurological: She is alert and oriented to person, place, and time. No sensory deficit.  Skin: Laceration noted.  Well healing laceration over eyebrow right.  Right subconjunctival hematoma.    ED Course  Procedures (including critical care time)  Labs Reviewed - No data to display No results found.   1. Visit for suture removal       MDM  Prn f/u anticipated.   SUTURE  REMOVAL Performed by: Burgess Amor  Consent: Verbal consent obtained. Patient identity confirmed: provided demographic data Time out: Immediately prior to procedure a "time out" was called to verify the correct patient, procedure, equipment, support staff and site/side marked as required.  Location details: Right eyebrow  Wound Appearance: clean  Sutures/Staples Removed: #4 sutures  Facility: sutures placed in this facility Patient tolerance: Patient tolerated the procedure well with no immediate complications.           Burgess Amor, [place] 03/09/12 1207

## 2012-04-25 ENCOUNTER — Other Ambulatory Visit: Payer: Self-pay

## 2012-04-25 ENCOUNTER — Telehealth: Payer: Self-pay

## 2012-05-08 ENCOUNTER — Encounter: Payer: Self-pay | Admitting: Nurse Practitioner

## 2012-05-08 ENCOUNTER — Ambulatory Visit (INDEPENDENT_AMBULATORY_CARE_PROVIDER_SITE_OTHER): Payer: Medicaid Other | Admitting: Nurse Practitioner

## 2012-05-08 VITALS — BP 112/66 | HR 71 | Ht 64.5 in | Wt 152.0 lb

## 2012-05-08 DIAGNOSIS — G35 Multiple sclerosis: Secondary | ICD-10-CM

## 2012-05-08 LAB — CBC WITH DIFFERENTIAL/PLATELET
Basophils Absolute: 0 10*3/uL (ref 0.0–0.2)
Basos: 0 % (ref 0–3)
Eos: 1 % (ref 0–5)
Eosinophils Absolute: 0 10*3/uL (ref 0.0–0.4)
HCT: 38.5 % (ref 34.0–46.6)
Hemoglobin: 13.4 g/dL (ref 11.1–15.9)
Lymphocytes Absolute: 0.6 10*3/uL — ABNORMAL LOW (ref 0.7–3.1)
Lymphs: 17 % (ref 14–46)
MCH: 32.7 pg (ref 26.6–33.0)
MCHC: 34.8 g/dL (ref 31.5–35.7)
MCV: 94 fL (ref 79–97)
Monocytes Absolute: 0.4 10*3/uL (ref 0.1–0.9)
Monocytes: 14 % — ABNORMAL HIGH (ref 4–12)
Neutrophils Absolute: 2.2 10*3/uL (ref 1.4–7.0)
Neutrophils Relative %: 68 % (ref 40–74)
RBC: 4.1 x10E6/uL (ref 3.77–5.28)
RDW: 13.2 % (ref 12.3–15.4)
WBC: 3.2 10*3/uL — ABNORMAL LOW (ref 3.4–10.8)

## 2012-05-08 NOTE — Patient Instructions (Addendum)
Continue Gilenya as ordered  CBC today will have results back tomorrow Followup with pharmacy for discrepancy in refills Followup in 6 months

## 2012-05-08 NOTE — Progress Notes (Signed)
HPI: Patient returns for followup after last visit 07/06/2011. She has a history of multiple sclerosis relapsing remitting. Initial symptoms were right optic neuritis in the 1980s then in May 2006 a left optic neuritis. MRI of the brain at that time showed typical MS lesions. She is currently on Gilenya  tolerating the medication without difficulty. She denies  spasms, focal weakness, sensory changes, visual changes, speech or swallowing problems, no problems with bowel or bladder function. No exacerbation since last seen, and no new neurologic complaints   ROS:  Negative  Physical Exam General: well developed, well nourished, seated, in no evident distress Head: head normocephalic and atraumatic. Oropharynx benign Neck: supple with no carotid or supraclavicular bruits Cardiovascular: regular rate and rhythm, no murmurs  Neurologic Exam Mental Status: Awake and fully alert. Oriented to place and time. Recent and remote memory intact. Attention span, concentration and fund of knowledge appropriate. Mood and affect appropriate.  Cranial Nerves: Pupils equal, briskly reactive to light. Extraocular movements full without nystagmus. Visual fields full to confrontation. Hearing intact and symmetric to finger snap. Facial sensation intact. Face, tongue, palate move normally and symmetrically. Neck flexion and extension normal.  Motor: Normal bulk and tone. Normal strength in all tested extremity muscles. Sensory.: intact to touch and pinprick and vibratory.  Coordination: Rapid alternating movements normal in all extremities. Finger-to-nose and heel-to-shin performed accurately bilaterally. Gait and Station: Arises from chair without difficulty. Stance is normal. Gait demonstrates normal stride length and balance . Able to heel, toe and tandem walk without difficulty.  Reflexes: 2+ and symmetric. Toes downgoing.     ASSESSMENT: Relapsing remitting multiple sclerosis currently on Gilenya without side  effects. MRI of the brain 08/29/2009 with numerous chronic demyelinating plaques nothing acute, no significant interval change from 08/06/2004     PLAN: Continue Gilenya, get CBC today. Followup in 6 months Continue walking for exercise and overall general health  Nilda Riggs, GNP-BC APRN

## 2012-06-20 ENCOUNTER — Telehealth: Payer: Self-pay | Admitting: Nurse Practitioner

## 2012-06-20 MED ORDER — FINGOLIMOD HCL 0.5 MG PO CAPS: 0.5000 mg | ORAL_CAPSULE | Freq: Every day | ORAL | Status: DC

## 2012-06-20 NOTE — Telephone Encounter (Signed)
Rx has been sent  

## 2012-07-28 ENCOUNTER — Telehealth: Payer: Self-pay | Admitting: Neurology

## 2012-07-28 NOTE — Telephone Encounter (Signed)
Gilenya is not covered under the patients insurance plan.  We have completed paperwork trying to get an exception.  Pending their response.  Called patient back, got no answer.

## 2012-07-30 ENCOUNTER — Telehealth: Payer: Self-pay | Admitting: Nurse Practitioner

## 2012-07-30 NOTE — Telephone Encounter (Signed)
I called.  Spoke with patient.  Advised her we are pending ins response.  I will leave samples at the front desk for her to pick up.  Dispensed Gilenya 0.5mg  #14 Lot [id number] Exp 09/21/2012.

## 2012-08-25 ENCOUNTER — Other Ambulatory Visit: Payer: Self-pay | Admitting: Obstetrics and Gynecology

## 2012-11-07 ENCOUNTER — Encounter: Payer: Self-pay | Admitting: Nurse Practitioner

## 2012-11-07 ENCOUNTER — Encounter (INDEPENDENT_AMBULATORY_CARE_PROVIDER_SITE_OTHER): Payer: Self-pay

## 2012-11-07 ENCOUNTER — Ambulatory Visit (INDEPENDENT_AMBULATORY_CARE_PROVIDER_SITE_OTHER): Payer: Medicaid Other | Admitting: Nurse Practitioner

## 2012-11-07 VITALS — BP 110/70 | HR 66 | Ht 65.0 in | Wt 155.0 lb

## 2012-11-07 DIAGNOSIS — Z79899 Other long term (current) drug therapy: Secondary | ICD-10-CM

## 2012-11-07 DIAGNOSIS — G35 Multiple sclerosis: Secondary | ICD-10-CM

## 2012-11-07 MED ORDER — FINGOLIMOD HCL 0.5 MG PO CAPS: 0.5000 mg | ORAL_CAPSULE | Freq: Every day | ORAL | Status: DC

## 2012-11-07 NOTE — Patient Instructions (Signed)
Patient to continue gilenya Check labs today MRI of the brain Followup in 6 months

## 2012-11-07 NOTE — Progress Notes (Signed)
[facility]  PATIENT: Kelly Grant DOB: 1965/11/12   REASON FOR VISIT: Followup for multiple sclerosis   HISTORY OF PRESENT ILLNESS: Kelly Grant, 47 year old female returns for followup. She was last seen 05/08/2012. She has a history of multiple sclerosis relapsing remitting. Initial symptoms were right optic neuritis in the 1980s then in May 2006 a left optic neuritis. MRI of the brain at that time showed typical Kelly lesions. She is currently on Gilenya tolerating the medication without difficulty. She denies spasms, focal weakness, sensory changes, visual changes, speech or swallowing problems, no problems with bowel or bladder function. No exacerbation since last seen, and no new neurologic complaints.    REVIEW OF SYSTEMS: Full 14 system review of systems performed and notable only for:  Constitutional: N/A  Cardiovascular: N/A  Ear/Nose/Throat: N/A  Skin: N/A  Eyes: N/A  Respiratory: N/A  Gastroitestinal: N/A  Hematology/Lymphatic: N/A  Endocrine: N/A Musculoskeletal:N/A  Allergy/Immunology: N/A  Neurological: N/A Psychiatric: N/A   ALLERGIES: Allergies  Allergen Reactions  . Macrobid [Nitrofurantoin Macrocrystal] Hives    HOME MEDICATIONS: Outpatient Prescriptions Prior to Visit  Medication Sig Dispense Refill  . Calcium Carbonate-Vitamin D (CALCIUM 600 + D PO) Take 1 tablet by mouth daily.      . Fingolimod HCl (GILENYA) 0.5 MG CAPS Take 1 capsule (0.5 mg total) by mouth daily.  30 capsule  6  . lidocaine (LIDODERM) 5 % Place 1 patch onto the skin daily as needed. Remove & Discard patch within 12 hours or as directed by MD      . ValACYclovir HCl (VALTREX PO) Take by mouth daily.      Marland Kitchen estradiol (ESTRACE) 0.1 MG/GM vaginal cream Place 0.25 g vaginally once a week. Saturday or Sunday      . PROGESTERONE MICRONIZED PO Take by mouth.       No facility-administered medications prior to visit.    PAST MEDICAL HISTORY: Past Medical History    Diagnosis Date  . Abnormal Pap smear     ASC-H  . HSV infection   . Yeast infection   . Chlamydia   . History of chicken pox   . Multiple sclerosis     PAST SURGICAL HISTORY: Past Surgical History  Procedure Laterality Date  . Tubal ligation    . Cesarean section    . Carpal tunnel release      FAMILY HISTORY: Family History  Problem Relation Age of Onset  . Kidney failure Mother   . Diabetes Mother     SOCIAL HISTORY: History   Social History  . Marital Status: Single    Spouse Name: N/A    Number of Children: N/A  . Years of Education: N/A   Occupational History  . Not on file.   Social History Main Topics  . Smoking status: Former Smoker    Quit date: 01/23/2000  . Smokeless tobacco: Never Used  . Alcohol Use: Yes     Comment: occasional  . Drug Use: No  . Sexual Activity: Not on file   Other Topics Concern  . Not on file   Social History Narrative  . No narrative on file     PHYSICAL EXAM  Filed Vitals:   11/07/12 0944  BP: 110/70  Pulse: 66  Height: 5\' 5"  (1.651 m)  Weight: 155 lb (70.308 kg)   Body mass index is 25.79 kg/(m^2).  Generalized: Well developed, in no acute distress  Head: normocephalic and atraumatic,. Oropharynx benign  Neck: Supple, no  carotid bruits  Cardiac: Regular rate rhythm, no murmur    Neurological examination   Mentation: Alert oriented to time, place, history taking. Follows all commands speech and language fluent  Cranial nerve II-XII: Pupils were equal round reactive to light extraocular movements were full, visual field were full on confrontational test. Visual acuity 20/70 right 20/100 left.  Facial sensation and strength were normal. hearing was intact to finger rubbing bilaterally. Uvula tongue midline. head turning and shoulder shrug and were normal and symmetric.Tongue protrusion into cheek strength was normal. Motor: normal bulk and tone, full strength in the BUE, BLE, fine finger movements normal,  no pronator drift. No focal weakness Sensory: normal and symmetric to light touch, pinprick, and  vibration  Coordination: finger-nose-finger, heel-to-shin bilaterally, no dysmetria Reflexes: Brachioradialis 2/2, biceps 2/2, triceps 2/2, patellar 2/2, Achilles 2/2, plantar responses were flexor bilaterally. Gait and Station: Rising up from seated position without assistance, normal stance, without trunk ataxia, moderate stride, good arm swing, smooth turning, able to perform tiptoe, and heel walking without difficulty.   DIAGNOSTIC DATA (LABS, IMAGING, TESTING) - I reviewed patient records, labs, notes, testing and imaging myself where available.  Lab Results  Component Value Date   WBC 3.2* 05/08/2012   HGB 13.4 05/08/2012   HCT 38.5 05/08/2012   MCV 94 05/08/2012     ASSESSMENT AND PLAN  47 y.o. year old female  has a past medical history of Multiple sclerosis. here for followup. Patient is currently on gilenya tolerating the medication without side effects.  Patient to continue gilenya Check labs today, CBC, CMP MRI of the brain and compare to 2011 Followup in 6 months Nilda Riggs, [facility], [facility], APRN  [facility] [address] [place], [place] [address] [phone]

## 2012-11-08 LAB — CBC WITH DIFFERENTIAL/PLATELET
Basophils Absolute: 0 10*3/uL (ref 0.0–0.2)
Basos: 0 %
Eos: 1 %
Eosinophils Absolute: 0 10*3/uL (ref 0.0–0.4)
HCT: 38.6 % (ref 34.0–46.6)
Hemoglobin: 13.5 g/dL (ref 11.1–15.9)
Lymphocytes Absolute: 0.4 10*3/uL — ABNORMAL LOW (ref 0.7–3.1)
Lymphs: 14 %
MCH: 32.8 pg (ref 26.6–33.0)
MCHC: 35 g/dL (ref 31.5–35.7)
MCV: 94 fL (ref 79–97)
Monocytes Absolute: 0.4 10*3/uL (ref 0.1–0.9)
Monocytes: 14 %
Neutrophils Absolute: 2.1 10*3/uL (ref 1.4–7.0)
Neutrophils Relative %: 71 %
RBC: 4.12 x10E6/uL (ref 3.77–5.28)
RDW: 13.2 % (ref 12.3–15.4)
WBC: 3 10*3/uL — ABNORMAL LOW (ref 3.4–10.8)

## 2012-11-08 LAB — COMPREHENSIVE METABOLIC PANEL
ALT: 30 IU/L (ref 0–32)
AST: 31 IU/L (ref 0–40)
Albumin/Globulin Ratio: 1.9 (ref 1.1–2.5)
Albumin: 4.2 g/dL (ref 3.5–5.5)
Alkaline Phosphatase: 76 IU/L (ref 39–117)
BUN/Creatinine Ratio: 15 (ref 9–23)
BUN: 12 mg/dL (ref 6–24)
CO2: 31 mmol/L — ABNORMAL HIGH (ref 18–29)
Calcium: 10.4 mg/dL — ABNORMAL HIGH (ref 8.7–10.2)
Chloride: 106 mmol/L (ref 96–108)
Creatinine, Ser: 0.82 mg/dL (ref 0.57–1.00)
GFR calc Af Amer: 98 mL/min/{1.73_m2} (ref >59)
GFR calc non Af Amer: 85 mL/min/{1.73_m2} (ref >59)
Globulin, Total: 2.2 g/dL (ref 1.5–4.5)
Glucose: 91 mg/dL (ref 65–99)
Potassium: 4.6 mmol/L (ref 3.5–5.2)
Sodium: 144 mmol/L (ref 134–144)
Total Bilirubin: 0.4 mg/dL (ref 0.0–1.2)
Total Protein: 6.4 g/dL (ref 6.0–8.5)

## 2012-11-10 NOTE — Progress Notes (Signed)
Quick Note:  I called and LMVM for pt that labs stable. She is to call back if questions. ______

## 2012-11-27 ENCOUNTER — Ambulatory Visit
Admission: RE | Admit: 2012-11-27 | Discharge: 2012-11-27 | Disposition: A | Discharge status: Home / Self Care | Payer: Medicaid Other | Source: Ambulatory Visit | Attending: Nurse Practitioner | Admitting: Nurse Practitioner

## 2012-11-27 DIAGNOSIS — G35 Multiple sclerosis: Secondary | ICD-10-CM

## 2012-11-27 MED ORDER — GADOBENATE DIMEGLUMINE 529 MG/ML IV SOLN
14.0000 mL | Freq: Once | INTRAVENOUS | Status: AC | PRN
  Administered 2012-11-27: 14 mL via INTRAVENOUS

## 2013-02-05 ENCOUNTER — Other Ambulatory Visit: Payer: Self-pay | Admitting: Neurology

## 2013-05-08 ENCOUNTER — Encounter (INDEPENDENT_AMBULATORY_CARE_PROVIDER_SITE_OTHER): Payer: Self-pay

## 2013-05-08 ENCOUNTER — Ambulatory Visit (INDEPENDENT_AMBULATORY_CARE_PROVIDER_SITE_OTHER): Payer: Medicaid Other | Admitting: Nurse Practitioner

## 2013-05-08 ENCOUNTER — Encounter: Payer: Self-pay | Admitting: Nurse Practitioner

## 2013-05-08 VITALS — BP 119/72 | HR 64 | Ht 65.0 in | Wt 150.0 lb

## 2013-05-08 DIAGNOSIS — Z5181 Encounter for therapeutic drug level monitoring: Secondary | ICD-10-CM

## 2013-05-08 DIAGNOSIS — G35 Multiple sclerosis: Secondary | ICD-10-CM

## 2013-05-08 LAB — CBC WITH DIFFERENTIAL/PLATELET
Basophils Absolute: 0 10*3/uL (ref 0.0–0.2)
Basos: 0 %
Eos: 1 %
Eosinophils Absolute: 0 10*3/uL (ref 0.0–0.4)
HCT: 37.9 % (ref 34.0–46.6)
Hemoglobin: 13.4 g/dL (ref 11.1–15.9)
Lymphocytes Absolute: 0.6 10*3/uL — ABNORMAL LOW (ref 0.7–3.1)
Lymphs: 20 %
MCH: 32.7 pg (ref 26.6–33.0)
MCHC: 35.4 g/dL (ref 31.5–35.7)
MCV: 92 fL (ref 79–97)
Monocytes Absolute: 0.4 10*3/uL (ref 0.1–0.9)
Monocytes: 16 %
Neutrophils Absolute: 1.8 10*3/uL (ref 1.4–7.0)
Neutrophils Relative %: 63 %
RBC: 4.1 x10E6/uL (ref 3.77–5.28)
RDW: 13.2 % (ref 12.3–15.4)
WBC: 2.8 10*3/uL — ABNORMAL LOW (ref 3.4–10.8)

## 2013-05-08 LAB — COMPREHENSIVE METABOLIC PANEL
ALT: 28 IU/L (ref 0–32)
AST: 34 IU/L (ref 0–40)
Albumin/Globulin Ratio: 2 (ref 1.1–2.5)
Albumin: 4.1 g/dL (ref 3.5–5.5)
Alkaline Phosphatase: 76 IU/L (ref 39–117)
BUN/Creatinine Ratio: 20 (ref 9–23)
BUN: 14 mg/dL (ref 6–24)
CO2: 30 mmol/L — ABNORMAL HIGH (ref 18–29)
Calcium: 9.9 mg/dL (ref 8.7–10.2)
Chloride: 105 mmol/L (ref 96–108)
Creatinine, Ser: 0.71 mg/dL (ref 0.57–1.00)
GFR calc Af Amer: 116 mL/min/{1.73_m2} (ref >59)
GFR calc non Af Amer: 100 mL/min/{1.73_m2} (ref >59)
Globulin, Total: 2.1 g/dL (ref 1.5–4.5)
Glucose: 87 mg/dL (ref 65–99)
Potassium: 4.3 mmol/L (ref 3.5–5.2)
Sodium: 141 mmol/L (ref 134–144)
Total Bilirubin: 0.2 mg/dL (ref 0.0–1.2)
Total Protein: 6.2 g/dL (ref 6.0–8.5)

## 2013-05-08 NOTE — Patient Instructions (Signed)
Continue gilenya at current dose Will check labs today Moderate exercise Followup in 6 months

## 2013-05-08 NOTE — Progress Notes (Signed)
[facility]  PATIENT: Kelly Grant DOB: February 05, 1965   REASON FOR VISIT: Followup for relapsing remitting multiple sclerosis.    HISTORY OF PRESENT ILLNESS: Ms. Heinemann, 48 year old female returns for followup. She was last seen in this office in 10/17/ 2014.  She has a history of multiple sclerosis relapsing remitting. Initial symptoms were right optic neuritis in the 1980s then in May 2006 a left optic neuritis. MRI of the brain at that time showed typical MS lesions. She is currently on Gilenya tolerating the medication without difficulty. She denies spasms, focal weakness, sensory changes, visual changes, speech or swallowing problems, no problems with bowel or bladder function. No exacerbation since last seen, and no new neurologic complaints. Most recent MRI of the brain performed 11/27/2012 was abnormal showing extensive bilateral subcortical and ventricular white matter hyperintensities compatible with chronic demyelinating plaques. No enhancing lesions are seen, presence of atrophy of the corpus colossal and multiple T1 black holes indicate chronic disease. Compared to the MRI July 2006 white matter disease burden is slightly progressed while atrophy and T1 black holes are much more advanced. She returns for reevaluation.     REVIEW OF SYSTEMS: Full 14 system review of systems performed and notable only for those listed, all others are neg:  Constitutional: N/A  Cardiovascular: N/A  Ear/Nose/Throat: N/A  Skin: N/A  Eyes: N/A  Respiratory: N/A  Gastroitestinal: N/A  Hematology/Lymphatic: N/A  Endocrine: N/A Musculoskeletal:N/A  Allergy/Immunology: N/A  Neurological: N/A Psychiatric: N/A   ALLERGIES: Allergies  Allergen Reactions  . Macrobid [Nitrofurantoin Macrocrystal] Hives    HOME MEDICATIONS: Outpatient Prescriptions Prior to Visit  Medication Sig Dispense Refill  . Calcium Carbonate-Vitamin D (CALCIUM 600 + D PO) Take 1 tablet by mouth daily.       Marland Kitchen GILENYA 0.5 MG CAPS TAKE ONE CAPSULE (0.5 MG) BY MOUTH EACH DAY. MAY TAKE WITH OR WITHOUTFOOD.  30 capsule  6  . lidocaine (LIDODERM) 5 % Place 1 patch onto the skin daily as needed (as needed). Remove & Discard patch within 12 hours or as directed by MD      . ValACYclovir HCl (VALTREX PO) Take by mouth daily.       No facility-administered medications prior to visit.    PAST MEDICAL HISTORY: Past Medical History  Diagnosis Date  . Abnormal Pap smear     ASC-H  . HSV infection   . Yeast infection   . Chlamydia   . History of chicken pox   . Multiple sclerosis     PAST SURGICAL HISTORY: Past Surgical History  Procedure Laterality Date  . Tubal ligation    . Cesarean section    . Carpal tunnel release      FAMILY HISTORY: Family History  Problem Relation Age of Onset  . Kidney failure Mother   . Diabetes Mother     SOCIAL HISTORY: History   Social History  . Marital Status: Single    Spouse Name: N/A    Number of Children: 2  . Years of Education: College    Occupational History  . HOUSEKEEPING    Social History Main Topics  . Smoking status: Former Smoker    Quit date: 01/23/2000  . Smokeless tobacco: Never Used  . Alcohol Use: Yes     Comment: occasional  . Drug Use: No  . Sexual Activity: Not on file   Other Topics Concern  . Not on file   Social History Narrative   Patient lives at home with  children.    Patient has 2 children.    Patient has a college degree.    Patient is single.    Patient is right handed.    Patient is currently employed.      PHYSICAL EXAM  Filed Vitals:   05/08/13 0959  BP: 119/72  Pulse: 64  Height: 5\' 5"  (1.651 m)  Weight: 150 lb (68.04 kg)   Body mass index is 24.96 kg/(m^2).  Generalized: Well developed, in no acute distress  Head: normocephalic and atraumatic,. Oropharynx benign  Neck: Supple, no carotid bruits  Cardiac: Regular rate rhythm, no murmur  Musculoskeletal: No deformity    Neurological examination   Mentation: Alert oriented to time, place, history taking. Follows all commands speech and language fluent  Cranial nerve II-XII: Visual acuity 20/70 right 20/50 left. Pupils were equal round reactive to light extraocular movements were full, visual field were full on confrontational test. Facial sensation and strength were normal. hearing was intact to finger rubbing bilaterally. Uvula tongue midline. head turning and shoulder shrug were normal and symmetric.Tongue protrusion into cheek strength was normal. Motor: normal bulk and tone, full strength in the BUE, BLE, fine finger movements normal, no pronator drift. No focal weakness Sensory: normal and symmetric to light touch, pinprick, and  vibration  Coordination: finger-nose-finger, heel-to-shin bilaterally, no dysmetria Reflexes: Brachioradialis 2/2, biceps 2/2, triceps 2/2, patellar 2/2, Achilles 2/2, plantar responses were flexor bilaterally. Gait and Station: Rising up from seated position without assistance, normal stance,  moderate stride, good arm swing, smooth turning, able to perform tiptoe, and heel walking without difficulty. Tandem gait is steady  DIAGNOSTIC DATA (LABS, IMAGING, TESTING) - I reviewed patient records, labs, notes, testing and imaging myself where available.  Lab Results  Component Value Date   WBC 3.0* 11/07/2012   HGB 13.5 11/07/2012   HCT 38.6 11/07/2012   MCV 94 11/07/2012      Component Value Date/Time   NA 144 11/07/2012 1023   K 4.6 11/07/2012 1023   CL 106 11/07/2012 1023   CO2 31* 11/07/2012 1023   GLUCOSE 91 11/07/2012 1023   BUN 12 11/07/2012 1023   CREATININE 0.82 11/07/2012 1023   CALCIUM 10.4* 11/07/2012 1023   PROT 6.4 11/07/2012 1023   AST 31 11/07/2012 1023   ALT 30 11/07/2012 1023   ALKPHOS 76 11/07/2012 1023   BILITOT 0.4 11/07/2012 1023   GFRNONAA 85 11/07/2012 1023   GFRAA 98 11/07/2012 1023    ASSESSMENT AND PLAN  48 y.o. year old female  has  a past medical history of Multiple sclerosis here for followup. She is currently on gilenya without side effects  Continue gilenya at current dose Will check labs today Moderate exercise Followup in 6 months Dennie Bible, [facility], [facility], APRN  [facility] [address] [place], [place] [address] [phone]

## 2013-07-23 ENCOUNTER — Other Ambulatory Visit: Payer: Self-pay | Admitting: Neurology

## 2013-07-29 ENCOUNTER — Telehealth: Payer: Self-pay | Admitting: Neurology

## 2013-07-29 NOTE — Telephone Encounter (Signed)
This Rx was already sent to [facility] on 07/02.  I called back.  Spoke with Deanne.  She transferred me to [facility].  They will fill Rx and contact patient to schedule shipment.

## 2013-07-29 NOTE — Telephone Encounter (Signed)
Sonia Baller from [facility] calling to get a verbal order for patient's Gilenya, please return call and advise.

## 2013-07-29 NOTE — Telephone Encounter (Signed)
Please advise 

## 2013-08-05 ENCOUNTER — Encounter: Payer: Self-pay | Admitting: Family Medicine

## 2013-08-05 ENCOUNTER — Ambulatory Visit (INDEPENDENT_AMBULATORY_CARE_PROVIDER_SITE_OTHER): Payer: Medicaid Other | Admitting: Family Medicine

## 2013-08-05 VITALS — BP 141/77 | HR 64 | Temp 98.3°F | Ht 64.0 in | Wt 147.0 lb

## 2013-08-05 DIAGNOSIS — Z1231 Encounter for screening mammogram for malignant neoplasm of breast: Secondary | ICD-10-CM

## 2013-08-05 DIAGNOSIS — B009 Herpesviral infection, unspecified: Secondary | ICD-10-CM

## 2013-08-05 DIAGNOSIS — R85611 Atypical squamous cells cannot exclude high grade squamous intraepithelial lesion on cytologic smear of anus (ASC-H): Secondary | ICD-10-CM

## 2013-08-05 DIAGNOSIS — G35 Multiple sclerosis: Secondary | ICD-10-CM

## 2013-08-05 DIAGNOSIS — Z23 Encounter for immunization: Secondary | ICD-10-CM

## 2013-08-05 DIAGNOSIS — R87629 Unspecified abnormal cytological findings in specimens from vagina: Secondary | ICD-10-CM

## 2013-08-05 NOTE — Progress Notes (Signed)
   Subjective:    Patient ID: Kelly Grant, female    DOB: [birth year], 48 y.o.   MRN: [id number]  HPI  Patient presents to establish care. She states that she was previously followed by Dr. Riley Churches for all primary needs.   Patient has a history of multiple sclerosis. Patient was originally diagnosed in 2006 following symptoms of ataxic gain and slurred speech . She is followed by [facility]. She was last seen in April and  to return for evaluation in October. Symptoms suggestive of MS have been mild since starting Gilenya. She denies numbness, tingling, fatigue, pain, or muscle spasms. Symptoms are currently of mild severity. Previous evaluation has included MRI on last April.   Review of Systems  Constitutional: Negative for fatigue.  Eyes: Negative.   Cardiovascular: Negative.   Endocrine: Negative.   Genitourinary: Negative.  Negative for vaginal bleeding, vaginal discharge and vaginal pain.  Skin: Negative.   Allergic/Immunologic: Negative.   Neurological: Negative.  Negative for seizures, light-headedness, numbness and headaches.  Hematological: Negative.   Psychiatric/Behavioral: Negative.        Objective:   Physical Exam  Vitals reviewed. Constitutional: She is oriented to person, place, and time. She appears well-developed and well-nourished.  HENT:  Head: Normocephalic and atraumatic.  Right Ear: External ear normal.  Eyes: Conjunctivae are normal. Pupils are equal, round, and reactive to light.  Neck: Normal range of motion. Neck supple.  Cardiovascular: Normal rate, regular rhythm and normal heart sounds.   Pulmonary/Chest: Effort normal and breath sounds normal.  Abdominal: Soft. Bowel sounds are normal.  Musculoskeletal: Normal range of motion.  Lymphadenopathy:       Head (right side): No submental and no submandibular adenopathy present.       Head (left side): No submental and no submandibular adenopathy present.    She has no  cervical adenopathy.  Neurological: She is alert and oriented to person, place, and time. She has normal strength and normal reflexes. No cranial nerve deficit or sensory deficit.  Skin: Skin is warm and dry.  Psychiatric: She has a normal mood and affect. Her behavior is normal. Judgment and thought content normal.     BP 141/77  Pulse 64  Temp(Src) 98.3 F (36.8 C) (Oral)  Ht 5\' 4"  (1.626 m)  Wt 147 lb (66.679 kg)  BMI 25.22 kg/m2      Assessment & Plan:  1. Multiple sclerosis: Patient is followed by Dr. Evelena Leyden at [facility]. She states that she is seen every 6 months   2. Abnormal pap smear: She reports a history of abnormal pap smears. Her last menstrual period was in 2010. She reports periodic hotflashes and vaginal dryness. She states that she is followed by Dr. Everett Graff at [facility]. Will send a referral.   3. HSV: Controlled on Valtrex.    Referral for gynecology: [facility]; Everett Graff Mammogram: Greater than 1 year/will send a referral  Tdap: Given without complication RTC: Return in 6 months for a complete physical examination  with Dr. Zigmund Daniel Will review medical records as they become available  Dorena Dew, FNP

## 2013-10-23 ENCOUNTER — Other Ambulatory Visit: Payer: Self-pay

## 2013-10-26 ENCOUNTER — Other Ambulatory Visit: Payer: Self-pay | Admitting: Family Medicine

## 2013-10-26 ENCOUNTER — Telehealth: Payer: Self-pay

## 2013-10-26 DIAGNOSIS — B009 Herpesviral infection, unspecified: Secondary | ICD-10-CM

## 2013-10-26 NOTE — Telephone Encounter (Signed)
GAVE PATIENT APPT TIME WITH DR. HARPER 12/09/13 AT 2:30PM

## 2013-10-26 NOTE — Telephone Encounter (Signed)
[place], Patient is requesting a refill on valtrex, This was put in as a historical medication, can you refill this for patient? She is down to 2 tablets. She was suppose to be referred to [facility] in July 2015, I called their office and they do not accept her insurance so she was never scheduled. I am working on finding a different ob/gyn to refer her to. Please advise. Thanks!

## 2013-10-30 MED ORDER — VALACYCLOVIR HCL 500 MG PO TABS: 500.0000 mg | ORAL_TABLET | Freq: Every day | ORAL | Status: DC

## 2013-11-05 ENCOUNTER — Ambulatory Visit (INDEPENDENT_AMBULATORY_CARE_PROVIDER_SITE_OTHER): Payer: Medicaid Other

## 2013-11-05 DIAGNOSIS — Z23 Encounter for immunization: Secondary | ICD-10-CM

## 2013-11-09 ENCOUNTER — Encounter: Payer: Self-pay | Admitting: Nurse Practitioner

## 2013-11-09 ENCOUNTER — Ambulatory Visit (INDEPENDENT_AMBULATORY_CARE_PROVIDER_SITE_OTHER): Payer: Medicaid Other | Admitting: Nurse Practitioner

## 2013-11-09 ENCOUNTER — Encounter (INDEPENDENT_AMBULATORY_CARE_PROVIDER_SITE_OTHER): Payer: Self-pay

## 2013-11-09 VITALS — BP 115/67 | HR 69 | Ht 65.0 in | Wt 148.8 lb

## 2013-11-09 DIAGNOSIS — G35 Multiple sclerosis: Secondary | ICD-10-CM

## 2013-11-09 DIAGNOSIS — Z5181 Encounter for therapeutic drug level monitoring: Secondary | ICD-10-CM

## 2013-11-09 LAB — CBC WITH DIFFERENTIAL/PLATELET
Basophils Absolute: 0 10*3/uL (ref 0.0–0.2)
Basos: 0 %
Eos: 1 %
Eosinophils Absolute: 0 10*3/uL (ref 0.0–0.4)
HCT: 37.6 % (ref 34.0–46.6)
Hemoglobin: 13.4 g/dL (ref 11.1–15.9)
Lymphocytes Absolute: 0.5 10*3/uL — ABNORMAL LOW (ref 0.7–3.1)
Lymphs: 18 %
MCH: 32.4 pg (ref 26.6–33.0)
MCHC: 35.6 g/dL (ref 31.5–35.7)
MCV: 91 fL (ref 79–97)
Monocytes Absolute: 0.4 10*3/uL (ref 0.1–0.9)
Monocytes: 14 %
Neutrophils Absolute: 2 10*3/uL (ref 1.4–7.0)
Neutrophils Relative %: 67 %
RBC: 4.14 x10E6/uL (ref 3.77–5.28)
RDW: 13.7 % (ref 12.3–15.4)
WBC: 3 10*3/uL — ABNORMAL LOW (ref 3.4–10.8)

## 2013-11-09 LAB — COMPREHENSIVE METABOLIC PANEL
ALT: 30 IU/L (ref 0–32)
AST: 29 IU/L (ref 0–40)
Albumin/Globulin Ratio: 2 (ref 1.1–2.5)
Albumin: 4.1 g/dL (ref 3.5–5.5)
Alkaline Phosphatase: 79 IU/L (ref 39–117)
BUN/Creatinine Ratio: 15 (ref 9–23)
BUN: 11 mg/dL (ref 6–24)
CO2: 29 mmol/L (ref 18–29)
Calcium: 9.6 mg/dL (ref 8.7–10.2)
Chloride: 106 mmol/L (ref 96–108)
Creatinine, Ser: 0.73 mg/dL (ref 0.57–1.00)
GFR calc Af Amer: 112 mL/min/{1.73_m2} (ref >59)
GFR calc non Af Amer: 97 mL/min/{1.73_m2} (ref >59)
Globulin, Total: 2.1 g/dL (ref 1.5–4.5)
Glucose: 93 mg/dL (ref 65–99)
Potassium: 4.3 mmol/L (ref 3.5–5.2)
Sodium: 142 mmol/L (ref 134–144)
Total Bilirubin: 0.4 mg/dL (ref 0.0–1.2)
Total Protein: 6.2 g/dL (ref 6.0–8.5)

## 2013-11-09 NOTE — Progress Notes (Signed)
[facility]  PATIENT: Kelly Grant DOB: June 28, 1965   REASON FOR VISIT: for multiple sclerosis   HISTORY OF PRESENT ILLNESS:Kelly Grant, 48 year old female returns for followup. She was last seen in this office in 05/08/13. She has a history of multiple sclerosis relapsing remitting. Initial symptoms were right optic neuritis in the 1980s then in May 2006 a left optic neuritis. MRI of the brain at that time showed typical MS lesions. She is currently on Gilenya tolerating the medication without difficulty. Today she  denies spasms, focal weakness, sensory changes, visual changes, speech or swallowing problems, no problems with bowel or bladder function. No exacerbation since last seen, and no new neurologic complaints. Most recent MRI of the brain performed 11/27/2012 was abnormal showing extensive bilateral subcortical and ventricular white matter hyperintensities compatible with chronic demyelinating plaques. No enhancing lesions are seen, presence of atrophy of the corpus colossal and multiple T1 black holes indicate chronic disease. Compared to the MRI July 2006 white matter disease burden is slightly progressed while atrophy and T1 black holes are much more advanced. She returns for reevaluation.      REVIEW OF SYSTEMS: Full 14 system review of systems performed and notable only for those listed, all others are neg:  Constitutional: N/A  Cardiovascular: N/A  Ear/Nose/Throat: N/A  Skin: N/A  Eyes: N/A  Respiratory: N/A  Gastroitestinal: N/A  Hematology/Lymphatic: N/A  Endocrine: N/A Musculoskeletal:N/A  Allergy/Immunology: N/A  Neurological: N/A Psychiatric: N/A Sleep : NA   ALLERGIES: Allergies  Allergen Reactions  . Macrobid [Nitrofurantoin Macrocrystal] Hives    HOME MEDICATIONS: Outpatient Prescriptions Prior to Visit  Medication Sig Dispense Refill  . Calcium Carbonate-Vitamin D (CALCIUM 600 + D PO) Take 1 tablet by mouth daily.      .  diclofenac sodium (VOLTAREN) 1 % GEL Apply topically as needed.      Marland Kitchen GILENYA 0.5 MG CAPS TAKE ONE CAPSULE BY MOUTH DAILY.  30 capsule  6  . valACYclovir (VALTREX) 500 MG tablet Take 1 tablet (500 mg total) by mouth daily.  90 tablet  2  . lidocaine (LIDODERM) 5 % Place 1 patch onto the skin daily as needed (as needed). Remove & Discard patch within 12 hours or as directed by MD       No facility-administered medications prior to visit.    PAST MEDICAL HISTORY: Past Medical History  Diagnosis Date  . Abnormal Pap smear     ASC-H  . HSV infection   . Yeast infection   . Chlamydia   . History of chicken pox   . Multiple sclerosis     PAST SURGICAL HISTORY: Past Surgical History  Procedure Laterality Date  . Tubal ligation    . Cesarean section    . Carpal tunnel release      FAMILY HISTORY: Family History  Problem Relation Age of Onset  . Kidney failure Mother   . Diabetes Mother     SOCIAL HISTORY: History   Social History  . Marital Status: Single    Spouse Name: N/A    Number of Children: 2  . Years of Education: College    Occupational History  . HOUSEKEEPING    Social History Main Topics  . Smoking status: Former Smoker    Quit date: 01/23/2000  . Smokeless tobacco: Never Used  . Alcohol Use: Yes     Comment: occasional  . Drug Use: No  . Sexual Activity: Not on file   Other Topics Concern  . Not  on file   Social History Narrative   Patient lives at home with children.    Patient has 2 children.    Patient has a college degree.    Patient is single.    Patient is right handed.    Patient is currently employed.      PHYSICAL EXAM  Filed Vitals:   11/09/13 1339  BP: 115/67  Pulse: 69  Height: 5\' 5"  (1.651 m)  Weight: 148 lb 12.8 oz (67.495 kg)   Body mass index is 24.76 kg/(m^2). Generalized: Well developed, in no acute distress  Head: normocephalic and atraumatic,. Oropharynx benign  Neck: Supple, no carotid bruits  Cardiac: Regular  rate rhythm, no murmur  Musculoskeletal: No deformity  Neurological examination  Mentation: Alert oriented to time, place, history taking. Follows all commands speech and language fluent  Cranial nerve II-XII: Visual acuity 20/50 right 20/40 left. Pupils were equal round reactive to light extraocular movements were full, visual field were full on confrontational test. Facial sensation and strength were normal. hearing was intact to finger rubbing bilaterally. Uvula tongue midline. head turning and shoulder shrug were normal and symmetric.Tongue protrusion into cheek strength was normal.  Motor: normal bulk and tone, full strength in the BUE, BLE, fine finger movements normal, no pronator drift. No focal weakness  Sensory: normal and symmetric to light touch, pinprick, and vibration  Coordination: finger-nose-finger, heel-to-shin bilaterally, no dysmetria  Reflexes: Brachioradialis 2/2, biceps 2/2, triceps 2/2, patellar 2/2, Achilles 2/2, plantar responses were flexor bilaterally.  Gait and Station: Rising up from seated position without assistance, normal stance, moderate stride, good arm swing, smooth turning, able to perform tiptoe, and heel walking without difficulty. Tandem gait is steady  DIAGNOSTIC DATA (LABS, IMAGING, TESTING) - I reviewed patient records, labs, notes, testing and imaging myself where available.  Lab Results  Component Value Date   WBC 2.8* 05/08/2013   HGB 13.4 05/08/2013   HCT 37.9 05/08/2013   MCV 92 05/08/2013      Component Value Date/Time   NA 141 05/08/2013 1033   K 4.3 05/08/2013 1033   CL 105 05/08/2013 1033   CO2 30* 05/08/2013 1033   GLUCOSE 87 05/08/2013 1033   BUN 14 05/08/2013 1033   CREATININE 0.71 05/08/2013 1033   CALCIUM 9.9 05/08/2013 1033   PROT 6.2 05/08/2013 1033   AST 34 05/08/2013 1033   ALT 28 05/08/2013 1033   ALKPHOS 76 05/08/2013 1033   BILITOT 0.2 05/08/2013 1033   GFRNONAA 100 05/08/2013 1033   GFRAA 116 05/08/2013 1033       ASSESSMENT  AND PLAN  48 y.o. year old female  has a past medical history of Multiple sclerosis. here to followup. She is currently on  gilenya without side effects  Continue gilenya at current dose Check labs today Continue moderate exercise daily by walking Followup in 6 months Dennie Bible, [facility], [facility], APRN  [facility] [address], [place], [place] [address] [phone]

## 2013-11-09 NOTE — Patient Instructions (Signed)
Continue gilenya at current dose Check labs today Continue moderate exercise daily by walking Followup in 6 months

## 2013-11-23 ENCOUNTER — Encounter: Payer: Self-pay | Admitting: Nurse Practitioner

## 2013-12-09 ENCOUNTER — Ambulatory Visit: Payer: Medicaid Other | Admitting: Obstetrics

## 2013-12-11 ENCOUNTER — Encounter: Payer: Self-pay | Admitting: Obstetrics

## 2013-12-11 ENCOUNTER — Ambulatory Visit (INDEPENDENT_AMBULATORY_CARE_PROVIDER_SITE_OTHER): Payer: Medicaid Other | Admitting: Obstetrics

## 2013-12-11 VITALS — BP 135/88 | HR 85 | Temp 98.5°F | Ht 64.0 in | Wt 147.0 lb

## 2013-12-11 DIAGNOSIS — Z780 Asymptomatic menopausal state: Secondary | ICD-10-CM

## 2013-12-11 DIAGNOSIS — Z0000 Encounter for general adult medical examination without abnormal findings: Secondary | ICD-10-CM

## 2013-12-11 NOTE — Patient Instructions (Addendum)
Menopause Menopause is the normal time of life when menstrual periods stop completely. Menopause is complete when you have missed 12 consecutive menstrual periods. It usually occurs between the ages of 48 years and 55 years. Very rarely does a woman develop menopause before the age of 40 years. At menopause, your ovaries stop producing the female hormones estrogen and progesterone. This can cause undesirable symptoms and also affect your health. Sometimes the symptoms may occur 4-5 years before the menopause begins. There is no relationship between menopause and:  Oral contraceptives.  Number of children you had.  Race.  The age your menstrual periods started (menarche). Heavy smokers and very thin women may develop menopause earlier in life. CAUSES  The ovaries stop producing the female hormones estrogen and progesterone.  Other causes include:  Surgery to remove both ovaries.  The ovaries stop functioning for no known reason.  Tumors of the pituitary gland in the brain.  Medical disease that affects the ovaries and hormone production.  Radiation treatment to the abdomen or pelvis.  Chemotherapy that affects the ovaries. SYMPTOMS   Hot flashes.  Night sweats.  Decrease in sex drive.  Vaginal dryness and thinning of the vagina causing painful intercourse.  Dryness of the skin and developing wrinkles.  Headaches.  Tiredness.  Irritability.  Memory problems.  Weight gain.  Bladder infections.  Hair growth of the face and chest.  Infertility. More serious symptoms include:  Loss of bone (osteoporosis) causing breaks (fractures).  Depression.  Hardening and narrowing of the arteries (atherosclerosis) causing heart attacks and strokes. DIAGNOSIS   When the menstrual periods have stopped for 12 straight months.  Physical exam.  Hormone studies of the blood. TREATMENT  There are many treatment choices and nearly as many questions about them. The  decisions to treat or not to treat menopausal changes is an individual choice made with your health care provider. Your health care provider can discuss the treatments with you. Together, you can decide which treatment will work best for you. Your treatment choices may include:   Hormone therapy (estrogen and progesterone).  Non-hormonal medicines.  Treating the individual symptoms with medicine (for example antidepressants for depression).  Herbal medicines that may help specific symptoms.  Counseling by a psychiatrist or psychologist.  Group therapy.  Lifestyle changes including:  Eating healthy.  Regular exercise.  Limiting caffeine and alcohol.  Stress management and meditation.  No treatment. HOME CARE INSTRUCTIONS   Take the medicine your health care provider gives you as directed.  Get plenty of sleep and rest.  Exercise regularly.  Eat a diet that contains calcium (good for the bones) and soy products (acts like estrogen hormone).  Avoid alcoholic beverages.  Do not smoke.  If you have hot flashes, dress in layers.  Take supplements, calcium, and vitamin D to strengthen bones.  You can use over-the-counter lubricants or moisturizers for vaginal dryness.  Group therapy is sometimes very helpful.  Acupuncture may be helpful in some cases. SEEK MEDICAL CARE IF:   You are not sure you are in menopause.  You are having menopausal symptoms and need advice and treatment.  You are still having menstrual periods after age 55 years.  You have pain with intercourse.  Menopause is complete (no menstrual period for 12 months) and you develop vaginal bleeding.  You need a referral to a specialist (gynecologist, psychiatrist, or psychologist) for treatment. SEEK IMMEDIATE MEDICAL CARE IF:   You have severe depression.  You have excessive vaginal bleeding.    You fell and think you have a broken bone.  You have pain when you urinate.  You develop leg or  chest pain.  You have a fast pounding heart beat (palpitations).  You have severe headaches.  You develop vision problems.  You feel a lump in your breast.  You have abdominal pain or severe indigestion. Document Released: 03/31/2003 Document Revised: 09/10/2012 Document Reviewed: 08/07/2012 [facility] Patient Information 2015 [place], [place]. This information is not intended to replace advice given to you by your health care provider. Make sure you discuss any questions you have with your health care provider. Menopause and Herbal Products Menopause is the normal time of life when menstrual periods stop completely. Menopause is complete when you have missed 12 consecutive menstrual periods. It usually occurs between the ages of 39 to 70, with an average age of 56. Very rarely does a woman develop menopause before 49 years old. At menopause, your ovaries stop producing the female hormones, estrogen and progesterone. This can cause undesirable symptoms and also affect your health. Sometimes the symptoms can occur 4 to 5 years before the menopause begins. There is no relationship between menopause and:  Oral contraceptives.  Number of children you had.  Race.  The age your menstrual periods started (menarche). Heavy smokers and very thin women may develop menopause earlier in life. Estrogen and progesterone hormone treatment is the usual method of treating menopausal symptoms. However, there are women who should not take hormone treatment. This is true of:   Women that have breast or uterine cancer.  Women who prefer not to take hormones because of certain side effects (abnormal uterine bleeding).  Women who are afraid that hormones may cause breast cancer.  Women who have a history of liver disease, heart disease, stroke, or blood clots. For these women, there are other medications that may help treat their menopausal symptoms. These medications are found in plants and botanical  products. They can be found in the form of herbs, teas, oils, tinctures, and pills.  CAUSES:  The ovaries stop producing the female hormones estrogen and progesterone.  Other causes include:  Surgery to remove both ovaries.  The ovaries stop functioning for no know reason.  Tumors of the pituitary gland in the brain.  Medical disease that affects the ovaries and hormone production.  Radiation treatment to the abdomen or pelvis.  Chemotherapy that affects the ovaries. PHYTOESTROGENS: Phytoestrogens occur naturally in plants and plant products. They act like estrogen in the body. Herbal medications are made from these plants and botanical steroids. There are 3 types of phytoestrogens:  Isoflavones (genistein and daidzein) are found in soy, garbanzo beans, miso and tofu foods.  Ligins are found in the shell of seeds. They are used to make oils like flaxseed oil. The bacteria in your intestine act on these foods to produce the estrogen-like hormones.  Coumestans are estrogen-like. Some of the foods they are found in include sunflower seeds and bean sprouts. CONDITIONS AND THEIR POSSIBLE HERBAL TREATMENT:  Hot flashes and night sweats.  Soy, black cohosh and evening primrose.  Irritability, insomnia, depression and memory problems.  Chasteberry, ginseng, and soy.  St. John's wort may be helpful for depression. However, there is a concern of it causing cataracts of the eye and may have bad effects on other medications. St. John's wort should not be taken for long time and without your caregiver's advice.  Loss of libido and vaginal and skin dryness.  Wild yam and soy.  Prevention of  coronary heart disease and osteoporosis.  Soy and Isoflavones. Several studies have shown that some women benefit from herbal medications, but most of the studies have not consistently shown that these supplements are much better than placebo. Other forms of treatment to help women with menopausal  symptoms include a balanced diet, rest, exercise, vitamin and calcium (with vitamin D) supplements, acupuncture, and group therapy when necessary. THOSE WHO SHOULD NOT TAKE HERBAL MEDICATIONS INCLUDE:  Women who are planning on getting pregnant unless told by your caregiver.  Women who are breastfeeding unless told by your caregiver.  Women who are taking other prescription medications unless told by your caregiver.  Infants, children, and elderly women unless told by your caregiver. Different herbal medications have different and unmeasured amounts of the herbal ingredients. There are no regulations, quality control, and standardization of the ingredients in herbal medications. Therefore, the amount of the ingredient in the medication may vary from one herb, pill, tea, oil or tincture to another. Many herbal medications can cause serious problems and can even have poisonous effects if taken too much or too long. If problems develop, the medication should be stopped and recorded by your caregiver. HOME CARE INSTRUCTIONS  Do not take or give children herbal medications without your caregiver's advice.  Let your caregiver know all the medications you are taking. This includes prescription, over-the-counter, eye drops, and creams.  Do not take herbal medications longer or more than recommended.  Tell your caregiver about any side effects from the medication. SEEK MEDICAL CARE IF:  You develop a fever of 102 F (38.9 C), or as directed by your caregiver.  You feel sick to your stomach (nauseous), vomit, or have diarrhea.  You develop a rash.  You develop abdominal pain.  You develop severe headaches.  You start to have vision problems.  You feel dizzy or faint.  You start to feel numbness in any part of your body.  You start shaking (have convulsions). Document Released: 06/27/2007 Document Revised: 12/26/2011 Document Reviewed: 01/24/2010 [facility] Patient Information 2015  [place], [place]. This information is not intended to replace advice given to you by your health care provider. Make sure you discuss any questions you have with your health care provider.

## 2013-12-11 NOTE — Progress Notes (Signed)
Subjective:     Kelly Grant is a 48 y.o. female here for a routine exam.  Current complaints: none.    Personal health questionnaire:  Is [name], have a family history of breast and/or ovarian cancer: no Is there a family history of uterine cancer diagnosed at age < 71, gastrointestinal cancer, urinary tract cancer, family member who is a Field seismologist syndrome-associated carrier: no Is the patient overweight and hypertensive, family history of diabetes, personal history of gestational diabetes or PCOS: no Is patient over 75, have PCOS,  family history of premature CHD under age 27, diabetes, smoke, have hypertension or peripheral artery disease:  no At any time, has a partner hit, kicked or otherwise hurt or frightened you?: no Over the past 2 weeks, have you felt down, depressed or hopeless?: no Over the past 2 weeks, have you felt little interest or pleasure in doing things?:no   Gynecologic History No LMP recorded. Patient is not currently having periods (Reason: Perimenopausal). Contraception: post menopausal status Last Pap: 2014. Results were: normal Last mammogram: 2015. Results were: normal  Obstetric History OB History  Gravida Para Term Preterm AB SAB TAB Ectopic Multiple Living  3 2            # Outcome Date GA Lbr Len/2nd Weight Sex Delivery Anes PTL Lv  3 Para           2 Para           1 Gravida               Past Medical History  Diagnosis Date  . Abnormal Pap smear     ASC-H  . HSV infection   . Yeast infection   . Chlamydia   . History of chicken pox   . Multiple sclerosis     Past Surgical History  Procedure Laterality Date  . Tubal ligation    . Cesarean section    . Carpal tunnel release      Current outpatient prescriptions: Calcium Carbonate-Vitamin D (CALCIUM 600 + D PO), Take 1 tablet by mouth daily., Disp: , Rfl: ;  diclofenac sodium (VOLTAREN) 1 % GEL, Apply topically as needed., Disp: , Rfl: ;  GILENYA 0.5 MG CAPS, TAKE ONE  CAPSULE BY MOUTH DAILY., Disp: 30 capsule, Rfl: 6;  valACYclovir (VALTREX) 500 MG tablet, Take 1 tablet (500 mg total) by mouth daily., Disp: 90 tablet, Rfl: 2 Allergies  Allergen Reactions  . Macrobid [Nitrofurantoin Macrocrystal] Hives    History  Substance Use Topics  . Smoking status: Former Smoker    Quit date: 01/23/2000  . Smokeless tobacco: Never Used  . Alcohol Use: 0.0 oz/week    0 Not specified per week     Comment: occasional    Family History  Problem Relation Age of Onset  . Kidney failure Mother   . Diabetes Mother       Review of Systems  Constitutional: negative for fatigue and weight loss Respiratory: negative for cough and wheezing Cardiovascular: negative for chest pain, fatigue and palpitations Gastrointestinal: negative for abdominal pain and change in bowel habits Musculoskeletal:negative for myalgias Neurological: negative for gait problems and tremors Behavioral/Psych: negative for abusive relationship, depression Endocrine: negative for temperature intolerance   Genitourinary:negative for abnormal menstrual periods, genital lesions, hot flashes, sexual problems and vaginal discharge Integument/breast: negative for breast lump, breast tenderness, nipple discharge and skin lesion(s)    Objective:       BP 135/88 mmHg  Pulse 85  Temp(Src) 98.5 F (36.9 C)  Ht 5\' 4"  (1.626 m)  Wt 147 lb (66.679 kg)  BMI 25.22 kg/m2 General:   alert  Skin:   no rash or abnormalities  Lungs:   clear to auscultation bilaterally  Heart:   regular rate and rhythm, S1, S2 normal, no murmur, click, rub or gallop  Breasts:   normal without suspicious masses, skin or nipple changes or axillary nodes  Abdomen:  normal findings: no organomegaly, soft, non-tender and no hernia  Pelvis:  External genitalia: normal general appearance Urinary system: urethral meatus normal and bladder without fullness, nontender Vaginal: normal without tenderness, induration or  masses Cervix: normal appearance Adnexa: normal bimanual exam Uterus: anteverted and non-tender, normal size   Lab Review Urine pregnancy test Labs reviewed yes Radiologic studies reviewed yes   Assessment:    Healthy female exam.    Postmenopausal.  Doing well.   Plan:    Education reviewed: calcium supplements, low fat, low cholesterol diet, self breast exams and weight bearing exercise. Follow up in: 1 year.   No orders of the defined types were placed in this encounter.   Orders Placed This Encounter  Procedures  . WET PREP BY MOLECULAR PROBE

## 2013-12-12 LAB — WET PREP BY MOLECULAR PROBE
Candida species: NEGATIVE
Gardnerella vaginalis: POSITIVE — AB
Trichomonas vaginosis: NEGATIVE

## 2013-12-13 ENCOUNTER — Other Ambulatory Visit: Payer: Self-pay | Admitting: Obstetrics

## 2013-12-13 DIAGNOSIS — N760 Acute vaginitis: Principal | ICD-10-CM

## 2013-12-13 DIAGNOSIS — B9689 Other specified bacterial agents as the cause of diseases classified elsewhere: Secondary | ICD-10-CM

## 2013-12-13 MED ORDER — TINIDAZOLE 500 MG PO TABS: 1000.0000 mg | ORAL_TABLET | Freq: Every day | ORAL | Status: DC

## 2013-12-15 ENCOUNTER — Other Ambulatory Visit: Payer: Self-pay | Admitting: *Deleted

## 2013-12-15 DIAGNOSIS — B9689 Other specified bacterial agents as the cause of diseases classified elsewhere: Secondary | ICD-10-CM

## 2013-12-15 DIAGNOSIS — N760 Acute vaginitis: Principal | ICD-10-CM

## 2013-12-15 LAB — PAP IG AND HPV HIGH-RISK: HPV DNA High Risk: DETECTED — AB

## 2013-12-15 MED ORDER — METRONIDAZOLE 500 MG PO TABS: 500.0000 mg | ORAL_TABLET | Freq: Two times a day (BID) | ORAL | Status: DC

## 2013-12-15 NOTE — Progress Notes (Signed)
Pt made aware of lab results and Metronidazole sent to pharmacy.

## 2013-12-24 ENCOUNTER — Telehealth: Payer: Self-pay | Admitting: Nurse Practitioner

## 2013-12-24 NOTE — Telephone Encounter (Signed)
OK with Gilenya if she has side effects needs to call OB

## 2013-12-24 NOTE — Telephone Encounter (Signed)
Pt states that her OB GYN prescribed 2 new medications metroNIDAZOLE (FLAGYL) 500 MG tablet and valACYclovir (VALTREX) 500 MG tablet.  She needs to know if she should stop the Gilenya before taking this medicine because of the drug interaction.  Please call and advise.

## 2013-12-24 NOTE — Telephone Encounter (Signed)
I called the patient back.  Relayed Carolyn's message.  She verbalized understanding.

## 2013-12-24 NOTE — Telephone Encounter (Signed)
Patient's OBGYN prescribed Valtrex and Flagyl.  She wants to verify Kelly Grant is okay with her continuing to take Gilenya while on these medications.  It does not appear there are any direct drug interactions, but side effects may include headache, nausea/vomitting and diarrhea.  Please advise.  Thank you.  (I will be happy to call her back)

## 2014-01-22 HISTORY — PX: POLYPECTOMY: SHX149

## 2014-01-22 HISTORY — PX: COLONOSCOPY: SHX174

## 2014-02-01 ENCOUNTER — Other Ambulatory Visit: Payer: Medicaid Other

## 2014-02-01 ENCOUNTER — Other Ambulatory Visit: Payer: Self-pay | Admitting: Family Medicine

## 2014-02-01 DIAGNOSIS — Z0000 Encounter for general adult medical examination without abnormal findings: Secondary | ICD-10-CM

## 2014-02-02 LAB — LIPID PANEL
Cholesterol: 241 mg/dL — ABNORMAL HIGH (ref 0–200)
HDL: 65 mg/dL (ref >39)
LDL Cholesterol: 152 mg/dL — ABNORMAL HIGH (ref 0–99)
Total CHOL/HDL Ratio: 3.7 Ratio
Triglycerides: 118 mg/dL (ref <150)
VLDL: 24 mg/dL (ref 0–40)

## 2014-02-02 LAB — COMPLETE METABOLIC PANEL WITH GFR
ALT: 26 U/L (ref 0–35)
AST: 24 U/L (ref 0–37)
Albumin: 4 g/dL (ref 3.5–5.2)
Alkaline Phosphatase: 69 U/L (ref 39–117)
BUN: 12 mg/dL (ref 6–23)
CO2: 28 mEq/L (ref 19–32)
Calcium: 9.3 mg/dL (ref 8.4–10.5)
Chloride: 105 mEq/L (ref 96–112)
Creat: 0.72 mg/dL (ref 0.50–1.10)
GFR, Est African American: >89 mL/min
GFR, Est Non African American: >89 mL/min
Glucose, Bld: 93 mg/dL (ref 70–99)
Potassium: 3.8 mEq/L (ref 3.5–5.3)
Sodium: 143 mEq/L (ref 135–145)
Total Bilirubin: 0.6 mg/dL (ref 0.2–1.2)
Total Protein: 6.2 g/dL (ref 6.0–8.3)

## 2014-02-02 LAB — HEMOGLOBIN A1C
Hgb A1c MFr Bld: 5.9 % — ABNORMAL HIGH (ref <5.7)
Mean Plasma Glucose: 123 mg/dL — ABNORMAL HIGH (ref <117)

## 2014-02-02 LAB — CBC WITH DIFFERENTIAL/PLATELET
Basophils Absolute: 0 10*3/uL (ref 0.0–0.1)
Basophils Relative: 0 % (ref 0–1)
Eosinophils Absolute: 0 10*3/uL (ref 0.0–0.7)
Eosinophils Relative: 1 % (ref 0–5)
HCT: 39.8 % (ref 36.0–46.0)
Hemoglobin: 13.4 g/dL (ref 12.0–15.0)
Lymphocytes Relative: 27 % (ref 12–46)
Lymphs Abs: 0.7 10*3/uL (ref 0.7–4.0)
MCH: 32.2 pg (ref 26.0–34.0)
MCHC: 33.7 g/dL (ref 30.0–36.0)
MCV: 95.7 fL (ref 78.0–100.0)
MPV: 9.2 fL (ref 8.6–12.4)
Monocytes Absolute: 0.4 10*3/uL (ref 0.1–1.0)
Monocytes Relative: 17 % — ABNORMAL HIGH (ref 3–12)
Neutro Abs: 1.4 10*3/uL — ABNORMAL LOW (ref 1.7–7.7)
Neutrophils Relative %: 55 % (ref 43–77)
Platelets: 288 10*3/uL (ref 150–400)
RBC: 4.16 MIL/uL (ref 3.87–5.11)
RDW: 14.5 % (ref 11.5–15.5)
WBC: 2.5 10*3/uL — ABNORMAL LOW (ref 4.0–10.5)

## 2014-02-02 LAB — VITAMIN D 25 HYDROXY (VIT D DEFICIENCY, FRACTURES): Vit D, 25-Hydroxy: 27 ng/mL — ABNORMAL LOW (ref 30–100)

## 2014-02-08 ENCOUNTER — Ambulatory Visit (INDEPENDENT_AMBULATORY_CARE_PROVIDER_SITE_OTHER): Payer: Medicaid Other | Admitting: Family Medicine

## 2014-02-08 VITALS — BP 134/89 | HR 78 | Temp 98.1°F | Resp 16 | Ht 64.0 in | Wt 146.0 lb

## 2014-02-08 DIAGNOSIS — Z0000 Encounter for general adult medical examination without abnormal findings: Secondary | ICD-10-CM

## 2014-02-08 NOTE — Progress Notes (Signed)
ANNUAL PREVENTATIVE VISIT AND CPE  Subjective:  Kelly Grant is a 49 y.o. female who presents for Annual Wellness Visit and complete physical.  Date of last wellness visit is unknown.  She does not workout. She denies chest pain, shortness of breath, dizziness.   She is not on cholesterol medication and denies myalgias. Her cholesterol is not at goal. The cholesterol last visit was:   4.6% risk factor, patient is borderline. We will need to evaluate in 6 months.  Lab Results  Component Value Date   CHOL 241* 02/01/2014   HDL 65 02/01/2014   LDLCALC 152* 02/01/2014   TRIG 118 02/01/2014   CHOLHDL 3.7 02/01/2014    She has not been working on diet and exercise for prediabetes, and denies increased appetite, paresthesia of the feet, polydipsia, polyuria and visual disturbances. Last A1C in the office was:  Lab Results  Component Value Date   HGBA1C 5.9* 02/01/2014   Patient is on Vitamin D supplement.   Lab Results  Component Value Date   VD25OH 27* 02/01/2014       Names of Other Physician/Practitioners you currently use: 1. [facility] here for primary care 2. Eye doctor, last visit  December 2013 3. Dental visit 6 months ago Patient Care Team: Leana Gamer, MD as PCP - General (Internal Medicine)   Medication Review: Current Outpatient Prescriptions on File Prior to Visit  Medication Sig Dispense Refill  . Calcium Carbonate-Vitamin D (CALCIUM 600 + D PO) Take 1 tablet by mouth daily.    . diclofenac sodium (VOLTAREN) 1 % GEL Apply topically as needed.    Marland Kitchen GILENYA 0.5 MG CAPS TAKE ONE CAPSULE BY MOUTH DAILY. 30 capsule 6  . valACYclovir (VALTREX) 500 MG tablet Take 1 tablet (500 mg total) by mouth daily. 90 tablet 2   No current facility-administered medications on file prior to visit.    Current Problems (verified) Patient Active Problem List   Diagnosis Date Noted  . Multiple sclerosis 05/08/2012  . Urinary tract infection, site not  specified 07/17/2011  . Pap smear abnormality of cervix 06/22/2011  . HSV infection 06/22/2011    Screening Tests Health Maintenance  Topic Date Due  . INFLUENZA VACCINE  08/23/2014  . PAP SMEAR  12/11/2016  . TETANUS/TDAP  08/06/2023    Immunization History  Administered Date(s) Administered  . Influenza,inj,Quad PF,36+ Mos 11/05/2013  . Tdap 03/04/2012, 08/05/2013    Preventative care: Last colonoscopy: Never Last mammogram: October 2015 Last pap smear/pelvic exam: 12/11/2013 Prior vaccinations: TD or Tdap: July 2015      Medication List       This list is accurate as of: 02/08/14  3:06 PM.  Always use your most recent med list.               CALCIUM 600 + D PO  Take 1 tablet by mouth daily.     GILENYA 0.5 MG Caps  Generic drug:  Fingolimod HCl  TAKE ONE CAPSULE BY MOUTH DAILY.     valACYclovir 500 MG tablet  Commonly known as:  VALTREX  Take 1 tablet (500 mg total) by mouth daily.     VOLTAREN 1 % Gel  Generic drug:  diclofenac sodium  Apply topically as needed.        Past Surgical History  Procedure Laterality Date  . Tubal ligation    . Cesarean section    . Carpal tunnel release     Family History  Problem  Relation Age of Onset  . Kidney failure Mother   . Diabetes Mother     History reviewed: allergies, current medications, past family history, past medical history, past social history, past surgical history and problem list   Risk Factors: Osteoporosis/FallRisk: In the past year have you fallen or had a near fall?:No History of fracture in the past year: No  Tobacco History  Substance Use Topics  . Smoking status: Former Smoker    Quit date: 01/23/2000  . Smokeless tobacco: Never Used  . Alcohol Use: 0.0 oz/week    0 Not specified per week     Comment: occasional   She does not smoke.  Patient is not a former smoker. Are there smokers in your home (other than you)?  Yes; her mother smokes outside  Alcohol Current  alcohol use: social drinker  Caffeine Current caffeine use: denies use  Exercise Current exercise: walking  Nutrition/Diet Current diet: in general, an "unhealthy" diet  Cardiac risk factors: Patient has a history of dyslipidemia with an ASCVD score of 4.6%. Patient will make lifestyle changes for 6 months. Will re-evaluate dyslipidemia in 6 months.  Depression Screen Q1: Over the past two weeks, have you felt down, depressed or hopeless?No Over the past two weeks, have you felt little interest or pleasure in doing things? No Have you lost interest or pleasure in daily life? No Do you often feel hopeless? No Do you cry easily over simple problems? No  Activities of Daily Living In your present state of health, do you have any difficulty performing the following activities?:  Driving? No Managing money? No Getting from bed to chair? No Climbing a flight of stairs?No Preparing food and eating?: No Bathing or showering? No Getting dressed: No Getting to the toilet? No Using the toilet:No Moving around from place to place: No In the past year have you fallen or had a near fall?:No  Are you sexually active?  Yes  Do you have more than one partner? No Vision Difficulties: No  Hearing Difficulties: No Do you often ask people to speak up or repeat themselves? Sometimes on the phone Do you experience ringing or noises in your ears? No  Do you have difficulty understanding soft or whispered voice? At times  Cognition  Do you feel that you have a problem with memory?No Do you often misplace items? No  Do you feel safe at home? Yes  Advanced directives Does patient have a [place]? No Does patient have a Living Will? No  Objective:     Blood pressure 134/89, pulse 78, temperature 98.1 F (36.7 C), temperature source Oral, resp. rate 16, height 5\' 4"  (1.626 m), weight 146 lb (66.225 kg), SpO2 99 %. Body mass index is 25.05 kg/(m^2).  General  appearance: alert, no distress, WD/WN, female Cognitive Testing  Alert? Yes  Normal Appearance?Yes  Oriented to person? Yes  Place? Yes   Time? Yes  Recall of three objects?  Yes  Can perform simple calculations? Yes  Displays appropriate judgment?Yes  Can read the correct time from a watch face?Yes  HEENT: normocephalic, sclerae anicteric, TMs pearly, nares patent, no discharge or erythema, pharynx normal Oral cavity: MMM, no lesions Neck: supple, no lymphadenopathy, no thyromegaly, no masses Heart: RRR, normal S1, S2, no murmurs Lungs: CTA bilaterally, no wheezes, rhonchi, or rales Abdomen: +bs, soft, non tender, non distended, no masses, no hepatomegaly, no splenomegaly Musculoskeletal: nontender, no swelling, no obvious deformity Extremities: no edema, no cyanosis, no  clubbing Pulses: 2+ symmetric, upper and lower extremities, normal cap refill Neurological: alert, oriented x 3, CN2-12 intact, strength normal upper extremities and lower extremities, sensation normal throughout, DTRs 2+ throughout, no cerebellar signs, gait normal Vaginal:   Psychiatric: normal affect, behavior normal, pleasant   Assessment:  Patient denies any difficulties at home. No trouble with ADLs, depression or falls. No recent changes to vision or hearing. Is UTD with immunizations. Is UTD with screening.  Encouraged heart healthy diet, exercise as tolerated and adequate sleep. Declines flu shot. Pap smear done previously.     Plan:   During the course of the visit the patient was educated and counseled about appropriate screening and preventive services including:    Pneumococcal vaccine   Influenza vaccine  Td vaccine  Screening electrocardiogram  Colorectal cancer screening  Diabetes screening  Nutrition counseling   Screening recommendations, referrals: Vaccinations: Please see documentation below and orders this visit.  Nutrition assessed and recommended  Colonoscopy requested.  Will need colonoscopy in April Recommended yearly ophthalmology/optometry visit  Recommended yearly dental visit for hygiene and checkup Advanced directives - requested  Conditions/risks identified: BMI: Discussed weight loss, diet, and increase physical activity.  Increase physical activity: AHA recommends 150 minutes of physical activity a week.  Medications reviewed Pre-diabetes is not at goal.     Lifestyle modifications recommended for 6 months.  Fall risk: Low risk- discussed PT, home fall assessment, medications.    Neriyah Cercone Jerilynn Mages, FNP   02/08/2014   Annual physical exam - Visual acuity screening (in office) - EKG 12-Lead - Ambulatory referral to Gastroenterology Reviewed labs

## 2014-02-08 NOTE — Patient Instructions (Signed)
Health Maintenance Adopting a healthy lifestyle and getting preventive care can go a long way to promote health and wellness. Talk with your health care provider about what schedule of regular examinations is right for you. This is a good chance for you to check in with your provider about disease prevention and staying healthy. In between checkups, there are plenty of things you can do on your own. Experts have done a lot of research about which lifestyle changes and preventive measures are most likely to keep you healthy. Ask your health care provider for more information. WEIGHT AND DIET  Eat a healthy diet 1. Be sure to include plenty of vegetables, fruits, low-fat dairy products, and lean protein. 2. Do not eat a lot of foods high in solid fats, added sugars, or salt. 3. Get regular exercise. This is one of the most important things you can do for your health. 1. Most adults should exercise for at least 150 minutes each week. The exercise should increase your heart rate and make you sweat (moderate-intensity exercise). 2. Most adults should also do strengthening exercises at least twice a week. This is in addition to the moderate-intensity exercise.  Maintain a healthy weight 1. Body mass index (BMI) is a measurement that can be used to identify possible weight problems. It estimates body fat based on height and weight. Your health care provider can help determine your BMI and help you achieve or maintain a healthy weight. 2. For females 6 years of age and older:  1. A BMI below 18.5 is considered underweight. 2. A BMI of 18.5 to 24.9 is normal. 3. A BMI of 25 to 29.9 is considered overweight. 4. A BMI of 30 and above is considered obese.  Watch levels of cholesterol and blood lipids 1. You should start having your blood tested for lipids and cholesterol at 50 years of age, then have this test every 5 years. 2. You may need to have your cholesterol levels checked more often if: 1. Your  lipid or cholesterol levels are high. 2. You are older than 50 years of age. 3. You are at high risk for heart disease.  CANCER SCREENING   Lung Cancer 1. Lung cancer screening is recommended for adults 7-87 years old who are at high risk for lung cancer because of a history of smoking. 2. A yearly low-dose CT scan of the lungs is recommended for people who: 1. Currently smoke. 2. Have quit within the past 15 years. 3. Have at least a 30-pack-year history of smoking. A pack year is smoking an average of one pack of cigarettes a day for 1 year. 3. Yearly screening should continue until it has been 15 years since you quit. 4. Yearly screening should stop if you develop a health problem that would prevent you from having lung cancer treatment.  Breast Cancer  Practice breast self-awareness. This means understanding how your breasts normally appear and feel.  It also means doing regular breast self-exams. Let your health care provider know about any changes, no matter how small.  If you are in your 20s or 30s, you should have a clinical breast exam (CBE) by a health care provider every 1-3 years as part of a regular health exam.  If you are 30 or older, have a CBE every year. Also consider having a breast X-Madlock (mammogram) every year.  If you have a family history of breast cancer, talk to your health care provider about genetic screening.  If you are  at high risk for breast cancer, talk to your health care provider about having an MRI and a mammogram every year.  Breast cancer gene (BRCA) assessment is recommended for women who have family members with BRCA-related cancers. BRCA-related cancers include:  Breast.  Ovarian.  Tubal.  Peritoneal cancers.  Results of the assessment will determine the need for genetic counseling and BRCA1 and BRCA2 testing. Cervical Cancer Routine pelvic examinations to screen for cervical cancer are no longer recommended for nonpregnant women who  are considered low risk for cancer of the pelvic organs (ovaries, uterus, and vagina) and who do not have symptoms. A pelvic examination may be necessary if you have symptoms including those associated with pelvic infections. Ask your health care provider if a screening pelvic exam is right for you.   The Pap test is the screening test for cervical cancer for women who are considered at risk.  If you had a hysterectomy for a problem that was not cancer or a condition that could lead to cancer, then you no longer need Pap tests.  If you are older than 65 years, and you have had normal Pap tests for the past 10 years, you no longer need to have Pap tests.  If you have had past treatment for cervical cancer or a condition that could lead to cancer, you need Pap tests and screening for cancer for at least 20 years after your treatment.  If you no longer get a Pap test, assess your risk factors if they change (such as having a new sexual partner). This can affect whether you should start being screened again.  Some women have medical problems that increase their chance of getting cervical cancer. If this is the case for you, your health care provider may recommend more frequent screening and Pap tests.  The human papillomavirus (HPV) test is another test that may be used for cervical cancer screening. The HPV test looks for the virus that can cause cell changes in the cervix. The cells collected during the Pap test can be tested for HPV.  The HPV test can be used to screen women 2 years of age and older. Getting tested for HPV can extend the interval between normal Pap tests from three to five years.  An HPV test also should be used to screen women of any age who have unclear Pap test results.  After 50 years of age, women should have HPV testing as often as Pap tests.  Colorectal Cancer  This type of cancer can be detected and often prevented.  Routine colorectal cancer screening usually  begins at 50 years of age and continues through 50 years of age.  Your health care provider may recommend screening at an earlier age if you have risk factors for colon cancer.  Your health care provider may also recommend using home test kits to check for hidden blood in the stool.  A small camera at the end of a tube can be used to examine your colon directly (sigmoidoscopy or colonoscopy). This is done to check for the earliest forms of colorectal cancer.  Routine screening usually begins at age 57.  Direct examination of the colon should be repeated every 5-10 years through 50 years of age. However, you may need to be screened more often if early forms of precancerous polyps or small growths are found. Skin Cancer  Check your skin from head to toe regularly.  Tell your health care provider about any new moles or changes in  moles, especially if there is a change in a mole's shape or color.  Also tell your health care provider if you have a mole that is larger than the size of a pencil eraser.  Always use sunscreen. Apply sunscreen liberally and repeatedly throughout the day.  Protect yourself by wearing long sleeves, pants, a wide-brimmed hat, and sunglasses whenever you are outside. HEART DISEASE, DIABETES, AND HIGH BLOOD PRESSURE   Have your blood pressure checked at least every 1-2 years. High blood pressure causes heart disease and increases the risk of stroke.  If you are between 32 years and 30 years old, ask your health care provider if you should take aspirin to prevent strokes.  Have regular diabetes screenings. This involves taking a blood sample to check your fasting blood sugar level.  If you are at a normal weight and have a low risk for diabetes, have this test once every three years after 50 years of age.  If you are overweight and have a high risk for diabetes, consider being tested at a younger age or more often. PREVENTING INFECTION  Hepatitis B  If you have a  higher risk for hepatitis B, you should be screened for this virus. You are considered at high risk for hepatitis B if:  You were born in a country where hepatitis B is common. Ask your health care provider which countries are considered high risk.  Your parents were born in a high-risk country, and you have not been immunized against hepatitis B (hepatitis B vaccine).  You have HIV or AIDS.  You use needles to inject street drugs.  You live with someone who has hepatitis B.  You have had sex with someone who has hepatitis B.  You get hemodialysis treatment.  You take certain medicines for conditions, including cancer, organ transplantation, and autoimmune conditions. Hepatitis C  Blood testing is recommended for:  Everyone born from [birth year] through 1966.  Anyone with known risk factors for hepatitis C. Sexually transmitted infections (STIs)  You should be screened for sexually transmitted infections (STIs) including gonorrhea and chlamydia if:  You are sexually active and are younger than 50 years of age.  You are older than 50 years of age and your health care provider tells you that you are at risk for this type of infection.  Your sexual activity has changed since you were last screened and you are at an increased risk for chlamydia or gonorrhea. Ask your health care provider if you are at risk.  If you do not have HIV, but are at risk, it may be recommended that you take a prescription medicine daily to prevent HIV infection. This is called pre-exposure prophylaxis (PrEP). You are considered at risk if:  You are sexually active and do not regularly use condoms or know the HIV status of your partner(s).  You take drugs by injection.  You are sexually active with a partner who has HIV. Talk with your health care provider about whether you are at high risk of being infected with HIV. If you choose to begin PrEP, you should first be tested for HIV. You should then be tested  every 3 months for as long as you are taking PrEP.  PREGNANCY   If you are premenopausal and you may become pregnant, ask your health care provider about preconception counseling.  If you may become pregnant, take 400 to 800 micrograms (mcg) of folic acid every day.  If you want to prevent pregnancy, talk to your  health care provider about birth control (contraception). OSTEOPOROSIS AND MENOPAUSE   Osteoporosis is a disease in which the bones lose minerals and strength with aging. This can result in serious bone fractures. Your risk for osteoporosis can be identified using a bone density scan.  If you are 44 years of age or older, or if you are at risk for osteoporosis and fractures, ask your health care provider if you should be screened.  Ask your health care provider whether you should take a calcium or vitamin D supplement to lower your risk for osteoporosis.  Menopause may have certain physical symptoms and risks.  Hormone replacement therapy may reduce some of these symptoms and risks. Talk to your health care provider about whether hormone replacement therapy is right for you.  HOME CARE INSTRUCTIONS   Schedule regular health, dental, and eye exams.  Stay current with your immunizations.   Do not use any tobacco products including cigarettes, chewing tobacco, or electronic cigarettes.  If you are pregnant, do not drink alcohol.  If you are breastfeeding, limit how much and how often you drink alcohol.  Limit alcohol intake to no more than 1 drink per day for nonpregnant women. One drink equals 12 ounces of beer, 5 ounces of wine, or 1 ounces of hard liquor.  Do not use street drugs.  Do not share needles.  Ask your health care provider for help if you need support or information about quitting drugs.  Tell your health care provider if you often feel depressed.  Tell your health care provider if you have ever been abused or do not feel safe at home. Document  Released: 07/24/2010 Document Revised: 05/25/2013 Document Reviewed: 12/10/2012 [facility] Patient Information 2015 [place], [place]. This information is not intended to replace advice given to you by your health care provider. Make sure you discuss any questions you have with your health care provider.  Colonoscopy A colonoscopy is an exam to look at the entire large intestine (colon). This exam can help find problems such as tumors, polyps, inflammation, and areas of bleeding. The exam takes about 1 hour.  LET [facility] CARE PROVIDER KNOW ABOUT:  4. Any allergies you have. 5. All medicines you are taking, including vitamins, herbs, eye drops, creams, and over-the-counter medicines. 6. Previous problems you or members of your family have had with the use of anesthetics. 7. Any blood disorders you have. 8. Previous surgeries you have had. 9. Medical conditions you have. RISKS AND COMPLICATIONS  Generally, this is a safe procedure. However, as with any procedure, complications can occur. Possible complications include: 3. Bleeding. 4. Tearing or rupture of the colon wall. 5. Reaction to medicines given during the exam. 6. Infection (rare). BEFORE THE PROCEDURE  3. Ask your health care provider about changing or stopping your regular medicines. 4. You may be prescribed an oral bowel prep. This involves drinking a large amount of medicated liquid, starting the day before your procedure. The liquid will cause you to have multiple loose stools until your stool is almost clear or light green. This cleans out your colon in preparation for the procedure. 5. Do not eat or drink anything else once you have started the bowel prep, unless your health care provider tells you it is safe to do so. 6. Arrange for someone to drive you home after the procedure. PROCEDURE  5. You will be given medicine to help you relax (sedative). 6. You will lie on your side with your knees bent. 7. A  long, flexible tube  with a light and camera on the end (colonoscope) will be inserted through the rectum and into the colon. The camera sends video back to a computer screen as it moves through the colon. The colonoscope also releases carbon dioxide gas to inflate the colon. This helps your health care provider see the area better. 8. During the exam, your health care provider may take a small tissue sample (biopsy) to be examined under a microscope if any abnormalities are found. 9. The exam is finished when the entire colon has been viewed. AFTER THE PROCEDURE   Do not drive for 24 hours after the exam.  You may have a small amount of blood in your stool.  You may pass moderate amounts of gas and have mild abdominal cramping or bloating. This is caused by the gas used to inflate your colon during the exam.  Ask when your test results will be ready and how you will get your results. Make sure you get your test results. Document Released: 01/06/2000 Document Revised: 10/29/2012 Document Reviewed: 09/15/2012 [facility] Patient Information 2015 [place], [place]. This information is not intended to replace advice given to you by your health care provider. Make sure you discuss any questions you have with your health care provider.  Fat and Cholesterol Control Diet Fat and cholesterol levels in your blood and organs are influenced by your diet. High levels of fat and cholesterol may lead to diseases of the heart, small and large blood vessels, gallbladder, liver, and pancreas. CONTROLLING FAT AND CHOLESTEROL WITH DIET Although exercise and lifestyle factors are important, your diet is key. That is because certain foods are known to raise cholesterol and others to lower it. The goal is to balance foods for their effect on cholesterol and more importantly, to replace saturated and trans fat with other types of fat, such as monounsaturated fat, polyunsaturated fat, and omega-3 fatty acids. On average, a person should consume  no more than 15 to 17 g of saturated fat daily. Saturated and trans fats are considered "bad" fats, and they will raise LDL cholesterol. Saturated fats are primarily found in animal products such as meats, butter, and cream. However, that does not mean you need to give up all your favorite foods. Today, there are good tasting, low-fat, low-cholesterol substitutes for most of the things you like to eat. Choose low-fat or nonfat alternatives. Choose round or loin cuts of red meat. These types of cuts are lowest in fat and cholesterol. Chicken (without the skin), fish, veal, and ground [place] breast are great choices. Eliminate fatty meats, such as hot dogs and salami. Even shellfish have little or no saturated fat. Have a 3 oz (85 g) portion when you eat lean meat, poultry, or fish. Trans fats are also called "partially hydrogenated oils." They are oils that have been scientifically manipulated so that they are solid at room temperature resulting in a longer shelf life and improved taste and texture of foods in which they are added. Trans fats are found in stick margarine, some tub margarines, cookies, crackers, and baked goods.  When baking and cooking, oils are a great substitute for butter. The monounsaturated oils are especially beneficial since it is believed they lower LDL and raise HDL. The oils you should avoid entirely are saturated tropical oils, such as coconut and palm.  Remember to eat a lot from food groups that are naturally free of saturated and trans fat, including fish, fruit, vegetables, beans, grains (barley, rice, couscous,  bulgur wheat), and pasta (without cream sauces).  IDENTIFYING FOODS THAT LOWER FAT AND CHOLESTEROL  Soluble fiber may lower your cholesterol. This type of fiber is found in fruits such as apples, vegetables such as broccoli, potatoes, and carrots, legumes such as beans, peas, and lentils, and grains such as barley. Foods fortified with plant sterols (phytosterol) may also  lower cholesterol. You should eat at least 2 g per day of these foods for a cholesterol lowering effect.  Read package labels to identify low-saturated fats, trans fat free, and low-fat foods at the supermarket. Select cheeses that have only 2 to 3 g saturated fat per ounce. Use a heart-healthy tub margarine that is free of trans fats or partially hydrogenated oil. When buying baked goods (cookies, crackers), avoid partially hydrogenated oils. Breads and muffins should be made from whole grains (whole-wheat or whole oat flour, instead of "flour" or "enriched flour"). Buy non-creamy canned soups with reduced salt and no added fats.  FOOD PREPARATION TECHNIQUES  Never deep-fry. If you must fry, either stir-fry, which uses very little fat, or use non-stick cooking sprays. When possible, broil, bake, or roast meats, and steam vegetables. Instead of putting butter or margarine on vegetables, use lemon and herbs, applesauce, and cinnamon (for squash and sweet potatoes). Use nonfat yogurt, salsa, and low-fat dressings for salads.  LOW-SATURATED FAT / LOW-FAT FOOD SUBSTITUTES Meats / Saturated Fat (g) 10. Avoid: Steak, marbled (3 oz/85 g) / 11 g 11. Choose: Steak, lean (3 oz/85 g) / 4 g 7. Avoid: Hamburger (3 oz/85 g) / 7 g 8. Choose: Hamburger, lean (3 oz/85 g) / 5 g 7. Avoid: Ham (3 oz/85 g) / 6 g 8. Choose: Ham, lean cut (3 oz/85 g) / 2.4 g 10. Avoid: Chicken, with skin, dark meat (3 oz/85 g) / 4 g 11. Choose: Chicken, skin removed, dark meat (3 oz/85 g) / 2 g  Avoid: Chicken, with skin, light meat (3 oz/85 g) / 2.5 g  Choose: Chicken, skin removed, light meat (3 oz/85 g) / 1 g Dairy / Saturated Fat (g)  Avoid: Whole milk (1 cup) / 5 g  Choose: Low-fat milk, 2% (1 cup) / 3 g  Choose: Low-fat milk, 1% (1 cup) / 1.5 g  Choose: Skim milk (1 cup) / 0.3 g  Avoid: Hard cheese (1 oz/28 g) / 6 g  Choose: Skim milk cheese (1 oz/28 g) / 2 to 3 g  Avoid: Cottage cheese, 4% fat (1 cup) / 6.5  g  Choose: Low-fat cottage cheese, 1% fat (1 cup) / 1.5 g  Avoid: Ice cream (1 cup) / 9 g  Choose: Sherbet (1 cup) / 2.5 g  Choose: Nonfat frozen yogurt (1 cup) / 0.3 g  Choose: Frozen fruit bar / trace  Avoid: Whipped cream (1 tbs) / 3.5 g  Choose: Nondairy whipped topping (1 tbs) / 1 g Condiments / Saturated Fat (g)  Avoid: Mayonnaise (1 tbs) / 2 g  Choose: Low-fat mayonnaise (1 tbs) / 1 g  Avoid: Butter (1 tbs) / 7 g  Choose: Extra light margarine (1 tbs) / 1 g  Avoid: Coconut oil (1 tbs) / 11.8 g  Choose: Olive oil (1 tbs) / 1.8 g  Choose: Corn oil (1 tbs) / 1.7 g  Choose: Safflower oil (1 tbs) / 1.2 g  Choose: Sunflower oil (1 tbs) / 1.4 g  Choose: Soybean oil (1 tbs) / 2.4 g  Choose: Canola oil (1 tbs) / 1 g Document Released: 01/08/2005  Document Revised: 05/05/2012 Document Reviewed: 04/08/2013 [facility] Patient Information 2015 [place], [place]. This information is not intended to replace advice given to you by your health care provider. Make sure you discuss any questions you have with your health care provider.  American Heart Association (AHA) Exercise Recommendation  Being physically active is important to prevent heart disease and stroke, the nation's No. 1and No. 5killers. To improve overall cardiovascular health, we suggest at least 150 minutes per week of moderate exercise or 75 minutes per week of vigorous exercise (or a combination of moderate and vigorous activity). Thirty minutes a day, five times a week is an easy goal to remember. You will also experience benefits even if you divide your time into two or three segments of 10 to 15 minutes per day.  For people who would benefit from lowering their blood pressure or cholesterol, we recommend 40 minutes of aerobic exercise of moderate to vigorous intensity three to four times a week to lower the risk for heart attack and stroke.  Physical activity is anything that makes you move your body and burn  calories.  This includes things like climbing stairs or playing sports. Aerobic exercises benefit your heart, and include walking, jogging, swimming or biking. Strength and stretching exercises are best for overall stamina and flexibility.  The simplest, positive change you can make to effectively improve your heart health is to start walking. It's enjoyable, free, easy, social and great exercise. A walking program is flexible and boasts high success rates because people can stick with it. It's easy for walking to become a regular and satisfying part of life.   For Overall Cardiovascular Health:  At least 30 minutes of moderate-intensity aerobic activity at least 5 days per week for a total of 150  OR   At least 25 minutes of vigorous aerobic activity at least 3 days per week for a total of 75 minutes; or a combination of moderate- and vigorous-intensity aerobic activity  AND   Moderate- to high-intensity muscle-strengthening activity at least 2 days per week for additional health benefits.  For Lowering Blood Pressure and Cholesterol  An average 40 minutes of moderate- to vigorous-intensity aerobic activity 3 or 4 times per week  What if I can't make it to the time goal? Something is always better than nothing! And everyone has to start somewhere. Even if you've been sedentary for years, today is the day you can begin to make healthy changes in your life. If you don't think you'll make it for 30 or 40 minutes, set a reachable goal for today. You can work up toward your overall goal by increasing your time as you get stronger. Don't let all-or-nothing thinking rob you of doing what you can every day.  Source:http://www.heart.org

## 2014-02-09 ENCOUNTER — Encounter: Payer: Self-pay | Admitting: Family Medicine

## 2014-02-25 ENCOUNTER — Other Ambulatory Visit: Payer: Self-pay | Admitting: Neurology

## 2014-03-02 ENCOUNTER — Telehealth: Payer: Self-pay | Admitting: Neurology

## 2014-03-02 ENCOUNTER — Telehealth: Payer: Self-pay | Admitting: Nurse Practitioner

## 2014-03-02 NOTE — Telephone Encounter (Signed)
Rx has already been sent.  I called the pharmacy.  Spoke with Gershon Mussel.  He was not able to assist me.  Asked that I call a different branch at [phone].  I called this line.  Spoke with [employer].  She verified they do have the Rx, and are currently processing it.  They wil contact patient regarding shipment once processing is complete.  I called the patient back.  Got no answer.

## 2014-03-02 NOTE — Telephone Encounter (Signed)
Pt is calling stating that [facility] is saying they have not received Rx for GILENYA 0.5 MG CAPS.  Please call and advise.

## 2014-03-02 NOTE — Telephone Encounter (Signed)
Albina Billet with [facility], [place], [place] is calling in regard to patient's Rx for Geleya 0.5 mg caps. Fax to [phone].

## 2014-03-02 NOTE — Telephone Encounter (Signed)
This has already been taken care of.  Please see previous note.

## 2014-03-15 ENCOUNTER — Encounter: Payer: Self-pay | Admitting: Gastroenterology

## 2014-04-30 ENCOUNTER — Ambulatory Visit: Payer: Medicaid Other | Admitting: *Deleted

## 2014-04-30 VITALS — Ht 64.0 in | Wt 139.0 lb

## 2014-04-30 DIAGNOSIS — Z1211 Encounter for screening for malignant neoplasm of colon: Secondary | ICD-10-CM

## 2014-04-30 MED ORDER — MOVIPREP 100 G PO SOLR: 1.0000 | Freq: Once | ORAL | Status: DC

## 2014-04-30 NOTE — Progress Notes (Signed)
No allergies to eggs or soy No home O2 No diet pills  No problems with sedation in the past

## 2014-05-12 ENCOUNTER — Ambulatory Visit (INDEPENDENT_AMBULATORY_CARE_PROVIDER_SITE_OTHER): Payer: Medicaid Other | Admitting: Nurse Practitioner

## 2014-05-12 ENCOUNTER — Encounter: Payer: Self-pay | Admitting: Nurse Practitioner

## 2014-05-12 VITALS — BP 114/73 | HR 69 | Ht 64.0 in | Wt 137.2 lb

## 2014-05-12 DIAGNOSIS — G35 Multiple sclerosis: Secondary | ICD-10-CM

## 2014-05-12 NOTE — Patient Instructions (Signed)
Continue Gilenya at current dose Labs reviewed from January Moderate exercise daily for overall health and well-being Follow-up in 6-8 months

## 2014-05-12 NOTE — Progress Notes (Signed)
[facility]  PATIENT: Kelly Grant DOB: December 01, 1965   REASON FOR VISIT: Follow-up for relapsing remitting multiple sclerosis  HISTORY FROM: Patient    HISTORY OF PRESENT ILLNESS:Kelly Grant,49 year old female returns for followup. She was last seen in this office in 11/09/13.She has a history of multiple sclerosis relapsing remitting. Initial symptoms were right optic neuritis in the 1980s then in May 2006 a left optic neuritis. MRI of the brain at that time showed typical MS lesions. She is currently on Gilenya tolerating the medication without difficulty. Today she denies spasms, focal weakness, sensory changes, visual changes, speech or swallowing problems, no problems with bowel or bladder function. No exacerbation since last seen, and no new neurologic complaints. Most recent MRI of the brain performed 11/27/2012 was abnormal showing extensive bilateral subcortical and ventricular white matter hyperintensities compatible with chronic demyelinating plaques. No enhancing lesions are seen, presence of atrophy of the corpus colossal and multiple T1 black holes indicate chronic disease. Compared to the MRI July 2006 white matter disease burden is slightly progressed while atrophy and T1 black holes are much more advanced. She returns for reevaluation.   REVIEW OF SYSTEMS: Full 14 system review of systems performed and notable only for those listed, all others are neg:  Constitutional: neg  Cardiovascular: neg Ear/Nose/Throat: neg  Skin: neg Eyes: neg Respiratory: neg Gastroitestinal: neg  Hematology/Lymphatic: neg  Endocrine: neg Musculoskeletal:neg Allergy/Immunology: neg Neurological: neg Psychiatric: neg Sleep : neg   ALLERGIES: Allergies  Allergen Reactions  . Macrobid [Nitrofurantoin Macrocrystal] Hives    HOME MEDICATIONS: Outpatient Prescriptions Prior to Visit  Medication Sig Dispense Refill  . Calcium Carbonate-Vitamin D (CALCIUM 600 + D PO)  Take 1 tablet by mouth daily.    . diclofenac sodium (VOLTAREN) 1 % GEL Apply topically as needed.    Marland Kitchen GILENYA 0.5 MG CAPS TAKE 1 CAPSULE BY MOUTH EVERY DAY 30 capsule 6  . MOVIPREP 100 G SOLR Take 1 kit (200 g total) by mouth once. moviprep as directed. No substitutions 1 kit 0  . valACYclovir (VALTREX) 500 MG tablet Take 1 tablet (500 mg total) by mouth daily. 90 tablet 2   No facility-administered medications prior to visit.    PAST MEDICAL HISTORY: Past Medical History  Diagnosis Date  . Abnormal Pap smear     ASC-H  . HSV infection   . Yeast infection   . Chlamydia   . History of chicken pox   . Multiple sclerosis   . Neuromuscular disorder     MS    PAST SURGICAL HISTORY: Past Surgical History  Procedure Laterality Date  . Tubal ligation    . Cesarean section      x2  . Carpal tunnel release    . Incision and drainage abscess      left buttock    FAMILY HISTORY: Family History  Problem Relation Age of Onset  . Kidney failure Mother   . Diabetes Mother   . Colon cancer Neg Hx     SOCIAL HISTORY: History   Social History  . Marital Status: Single    Spouse Name: N/A  . Number of Children: 2  . Years of Education: College    Occupational History  . HOUSEKEEPING    Social History Main Topics  . Smoking status: Former Smoker    Quit date: 01/23/2000  . Smokeless tobacco: Never Used  . Alcohol Use: 0.0 oz/week    0 Standard drinks or equivalent per week  Comment: occasional  . Drug Use: No  . Sexual Activity:    Partners: Male    Birth Control/ Protection: None, Surgical   Other Topics Concern  . Not on file   Social History Narrative   Patient lives at home with children.    Patient has 2 children.    Patient has a college degree.    Patient is single.    Patient is right handed.    Patient is currently employed.      PHYSICAL EXAM  Filed Vitals:   05/12/14 0928  BP: 114/73  Pulse: 69  Height: 5' 4" (1.626 m)  Weight: 137 lb  3.2 oz (62.234 kg)   Body mass index is 23.54 kg/(m^2). Generalized: Well developed, in no acute distress  Head: normocephalic and atraumatic,. Oropharynx benign  Neck: Supple, no carotid bruits  Cardiac: Regular rate rhythm, no murmur  Musculoskeletal: No deformity  Neurological examination  Mentation: Alert oriented to time, place, history taking. Follows all commands speech and language fluent  Cranial nerve II-XII: Visual acuity 20/50 right 20/50 left. Pupils were equal round reactive to light extraocular movements were full, visual field were full on confrontational test. Facial sensation and strength were normal. hearing was intact to finger rubbing bilaterally. Uvula tongue midline. head turning and shoulder shrug were normal and symmetric.Tongue protrusion into cheek strength was normal.  Motor: normal bulk and tone, full strength in the BUE, BLE, fine finger movements normal, no pronator drift. No focal weakness  Sensory: normal and symmetric to light touch, pinprick, and vibration  Coordination: finger-nose-finger, heel-to-shin bilaterally, no dysmetria  Reflexes: Brachioradialis 2/2, biceps 2/2, triceps 2/2, patellar 2/2, Achilles 2/2, plantar responses were flexor bilaterally.  Gait and Station: Rising up from seated position without assistance, normal stance, moderate stride, good arm swing, smooth turning, able to perform tiptoe, and heel walking without difficulty. Tandem gait is steady   DIAGNOSTIC DATA (LABS, IMAGING, TESTING) - I reviewed patient records, labs, notes, testing and imaging myself where available.  Lab Results  Component Value Date   WBC 2.5* 02/01/2014   HGB 13.4 02/01/2014   HCT 39.8 02/01/2014   MCV 95.7 02/01/2014   PLT 288 02/01/2014      Component Value Date/Time   NA 143 02/01/2014 0957   NA 142 11/09/2013 1414   K 3.8 02/01/2014 0957   CL 105 02/01/2014 0957   CO2 28 02/01/2014 0957   GLUCOSE 93 02/01/2014 0957   GLUCOSE 93  11/09/2013 1414   BUN 12 02/01/2014 0957   BUN 11 11/09/2013 1414   CREATININE 0.72 02/01/2014 0957   CREATININE 0.73 11/09/2013 1414   CALCIUM 9.3 02/01/2014 0957   PROT 6.2 02/01/2014 0957   PROT 6.2 11/09/2013 1414   ALBUMIN 4.0 02/01/2014 0957   AST 24 02/01/2014 0957   ALT 26 02/01/2014 0957   ALKPHOS 69 02/01/2014 0957   BILITOT 0.6 02/01/2014 0957   GFRNONAA >89 02/01/2014 0957   GFRNONAA 97 11/09/2013 1414   GFRAA >89 02/01/2014 0957   GFRAA 112 11/09/2013 1414   Lab Results  Component Value Date   CHOL 241* 02/01/2014   HDL 65 02/01/2014   LDLCALC 152* 02/01/2014   TRIG 118 02/01/2014   CHOLHDL 3.7 02/01/2014   Lab Results  Component Value Date   HGBA1C 5.9* 02/01/2014    ASSESSMENT AND PLAN  49 y.o. year old female  has a past medical history of relapsing remitting multiple sclerosis here to follow-up. She is currently on Gilenya   without side effects, lymphocyte count is stable.  Continue Gilenya at current dose Labs reviewed from January, lymphocyte count stable Moderate exercise daily for overall health and well-being Follow-up in 6-8 months Nancy Carolyn Martin, GNP, BC, APRN  [facility] [address], [address] [place], [place] [address] [phone]  

## 2014-05-13 NOTE — Progress Notes (Signed)
I have reviewed and agreed above plan. 

## 2014-05-14 ENCOUNTER — Ambulatory Visit (AMBULATORY_SURGERY_CENTER): Payer: Medicaid Other | Admitting: Gastroenterology

## 2014-05-14 ENCOUNTER — Encounter: Payer: Self-pay | Admitting: Gastroenterology

## 2014-05-14 VITALS — BP 113/68 | HR 57 | Temp 98.5°F | Resp 18 | Ht 64.0 in | Wt 139.0 lb

## 2014-05-14 DIAGNOSIS — Z1211 Encounter for screening for malignant neoplasm of colon: Secondary | ICD-10-CM

## 2014-05-14 DIAGNOSIS — D125 Benign neoplasm of sigmoid colon: Secondary | ICD-10-CM

## 2014-05-14 DIAGNOSIS — D122 Benign neoplasm of ascending colon: Secondary | ICD-10-CM

## 2014-05-14 MED ORDER — SODIUM CHLORIDE 0.9 % IV SOLN: 500.0000 mL | INTRAVENOUS | Status: DC

## 2014-05-14 NOTE — Patient Instructions (Signed)
YOU HAD AN ENDOSCOPIC PROCEDURE TODAY AT [facility]:   Refer to the procedure report that was given to you for any specific questions about what was found during the examination.  If the procedure report does not answer your questions, please call your gastroenterologist to clarify.  If you requested that your care partner not be given the details of your procedure findings, then the procedure report has been included in a sealed envelope for you to review at your convenience later.  YOU SHOULD EXPECT: Some feelings of bloating in the abdomen. Passage of more gas than usual.  Walking can help get rid of the air that was put into your GI tract during the procedure and reduce the bloating. If you had a lower endoscopy (such as a colonoscopy or flexible sigmoidoscopy) you may notice spotting of blood in your stool or on the toilet paper. If you underwent a bowel prep for your procedure, you may not have a normal bowel movement for a few days.  Please Note:  You might notice some irritation and congestion in your nose or some drainage.  This is from the oxygen used during your procedure.  There is no need for concern and it should clear up in a day or so.  SYMPTOMS TO REPORT IMMEDIATELY:   Following lower endoscopy (colonoscopy or flexible sigmoidoscopy):  Excessive amounts of blood in the stool  Significant tenderness or worsening of abdominal pains  Swelling of the abdomen that is new, acute  Fever of 100F or higher   For urgent or emergent issues, a gastroenterologist can be reached at any hour by calling [phone].   DIET: Your first meal following the procedure should be a small meal and then it is ok to progress to your normal diet. Heavy or fried foods are harder to digest and may make you feel nauseous or bloated.  Likewise, meals heavy in dairy and vegetables can increase bloating.  Drink plenty of fluids but you should avoid alcoholic beverages for 24 hours. Try to  increase the fiber in your diet.  ACTIVITY:  You should plan to take it easy for the rest of today and you should NOT DRIVE or use heavy machinery until tomorrow (because of the sedation medicines used during the test).    FOLLOW UP: Our staff will call the number listed on your records the next business day following your procedure to check on you and address any questions or concerns that you may have regarding the information given to you following your procedure. If we do not reach you, we will leave a message.  However, if you are feeling well and you are not experiencing any problems, there is no need to return our call.  We will assume that you have returned to your regular daily activities without incident.  If any biopsies were taken you will be contacted by phone or by letter within the next 1-3 weeks.  Please call us at [phone] if you have not heard about the biopsies in 3 weeks.    SIGNATURES/CONFIDENTIALITY: You and/or your care partner have signed paperwork which will be entered into your electronic medical record.  These signatures attest to the fact that that the information above on your After Visit Summary has been reviewed and is understood.  Full responsibility of the confidentiality of this discharge information lies with you and/or your care-partner.  Read all of the handouts given to you by your recovery room nurse.

## 2014-05-14 NOTE — Op Note (Signed)
[place]  [address]. [place], [id number]   COLONOSCOPY PROCEDURE REPORT  PATIENT: Kelly Grant, Kelly Grant  MR#: [id number] BIRTHDATE: 1965/12/07 , 49  yrs. old GENDER: female ENDOSCOPIST: Milus Banister, MD REFERRED BY: Liston Alba, MD PROCEDURE DATE:  05/14/2014 PROCEDURE:   Colonoscopy with snare polypectomy and Colonoscopy, screening First Screening Colonoscopy - Avg.  risk and is 50 yrs.  old or older Yes.  Prior Negative Screening - Now for repeat screening. N/A  History of Adenoma - Now for follow-up colonoscopy & has been > or = to 3 yrs.  N/A ASA CLASS:   Class II INDICATIONS:Screening for colonic neoplasia and Colorectal Neoplasm Risk Assessment for this procedure is average risk. MEDICATIONS: Monitored anesthesia care and Propofol 200 mg IV  DESCRIPTION OF PROCEDURE:   After the risks benefits and alternatives of the procedure were thoroughly explained, informed consent was obtained.  The digital rectal exam revealed no abnormalities of the rectum.   The LB [id number]  endoscope was introduced through the anus and advanced to the cecum, which was identified by both the appendix and ileocecal valve. No adverse events experienced.   The quality of the prep was excellent.  The instrument was then slowly withdrawn as the colon was fully examined.  COLON FINDINGS: Two sessile polyps ranging between 3-9mm in size were found in the sigmoid colon and ascending colon.  Polypectomies were performed with a cold snare.  The resection was complete, the polyp tissue was partially retrieved and sent to histology.   A sessile polyp measuring 8 mm in size was found in the ascending colon.  A polypectomy was performed using snare cautery.  The resection was complete, the polyp tissue was completely retrieved and sent to histology.   There was mild diverticulosis noted throughout the entire examined colon.   The examination was otherwise normal.   Retroflexed views revealed no abnormalities. The time to cecum = 3.2 Withdrawal time = 11.0   The scope was withdrawn and the procedure completed. COMPLICATIONS: There were no immediate complications.  ENDOSCOPIC IMPRESSION: 1.   Two sessile polyps ranging between 3-68mm in size were found in the sigmoid colon and ascending colon; polypectomies were performed with a cold snare 2.   Sessile polyp was found in the ascending colon; polypectomy was performed using snare cautery 3.   Mild diverticulosis was noted throughout the entire examined colon 4.   The examination was otherwise normal  RECOMMENDATIONS: If the polyp(s) removed today are proven to be adenomatous (pre-cancerous) polyps, you will need a repeat colonoscopy in 5 years.  Otherwise you should continue to follow colorectal cancer screening guidelines for "routine risk" patients with colonoscopy in 10 years.  You will receive a letter within 1-2 weeks with the results of your biopsy as well as final recommendations.  Please call my office if you have not received a letter after 3 weeks.  eSigned:  Milus Banister, MD 05/14/2014 10:32 AM revise  PATIENT NAME:  Kelly Grant, Kelly Grant MR#: [id number]

## 2014-05-14 NOTE — Progress Notes (Signed)
Called to room to assist during endoscopic procedure.  Patient ID and intended procedure confirmed with present staff. Received instructions for my participation in the procedure from the performing physician.  

## 2014-05-14 NOTE — Progress Notes (Signed)
Report to PACU, RN, vss, BBS= Clear.  

## 2014-05-17 ENCOUNTER — Telehealth: Payer: Self-pay | Admitting: *Deleted

## 2014-05-17 NOTE — Telephone Encounter (Signed)
  Follow up Call-  Call back number 05/14/2014  Post procedure Call Back phone  # [phone]  Permission to leave phone message Yes     Patient questions:  Do you have a fever, pain , or abdominal swelling? No. Pain Score  0 *  Have you tolerated food without any problems? Yes.    Have you been able to return to your normal activities? Yes.    Do you have any questions about your discharge instructions: Diet   No. Medications  No. Follow up visit  No.  Do you have questions or concerns about your Care? No.  Actions: * If pain score is 4 or above: No action needed, pain <4.

## 2014-05-19 ENCOUNTER — Encounter: Payer: Self-pay | Admitting: Gastroenterology

## 2014-07-28 ENCOUNTER — Other Ambulatory Visit: Payer: Self-pay | Admitting: Family Medicine

## 2014-07-28 DIAGNOSIS — B009 Herpesviral infection, unspecified: Secondary | ICD-10-CM

## 2014-07-28 MED ORDER — VALACYCLOVIR HCL 500 MG PO TABS: 500.0000 mg | ORAL_TABLET | Freq: Every day | ORAL | Status: DC

## 2014-07-28 NOTE — Telephone Encounter (Signed)
Refill for valtrex sent into pharmacy. Thanks!  

## 2014-07-29 ENCOUNTER — Ambulatory Visit (INDEPENDENT_AMBULATORY_CARE_PROVIDER_SITE_OTHER): Payer: Medicaid Other | Admitting: Family Medicine

## 2014-07-29 VITALS — BP 129/76 | HR 76 | Temp 98.4°F | Resp 16 | Ht 64.0 in | Wt 135.0 lb

## 2014-07-29 DIAGNOSIS — R300 Dysuria: Secondary | ICD-10-CM | POA: Diagnosis not present

## 2014-07-29 LAB — POCT URINALYSIS DIP (DEVICE)
Bilirubin Urine: NEGATIVE
Glucose, UA: NEGATIVE mg/dL
Ketones, ur: NEGATIVE mg/dL
Leukocytes, UA: NEGATIVE
Nitrite: POSITIVE — AB
Protein, ur: NEGATIVE mg/dL
Specific Gravity, Urine: 1.02 (ref 1.005–1.030)
Urobilinogen, UA: 1 mg/dL (ref 0.0–1.0)
pH: 8.5 — ABNORMAL HIGH (ref 5.0–8.0)

## 2014-07-29 MED ORDER — SULFAMETHOXAZOLE-TRIMETHOPRIM 800-160 MG PO TABS: 1.0000 | ORAL_TABLET | Freq: Two times a day (BID) | ORAL | Status: DC

## 2014-07-29 NOTE — Progress Notes (Signed)
Subjective:     Patient ID: Kelly Grant, female   DOB: 1965/02/28, 49 y.o.   MRN: [id number]  HPI   Pattient presents today with a 1-2 day history of dysuria and frequency. She has a history of UTI about 2 years ago. She has MS.   Review of Systems   Negative for fever chills Negative for hematuria Negative for flank pain Negative for chest pain or shortness of breath.     Objective:   Physical Exam   Alert, oriented, appropriate, in no distress Skin is warm and dry. Lungs are clear HS are regular There is no flank pain or suprapubic tenderness  There is a trace of blood and nitrates are positive, No leukocytes Assessment:    UTI      Plan:     Based on symptoms, nitrates and trace of blood, will start on Septra DS, #10, one po bid for 5 days Will send urine specimen for culture. Will call if antibiotic needs changing Instructed to follow-up in 3 days if symptoms not resolving.  Micheline Chapman, FNP [facility]

## 2014-07-29 NOTE — Patient Instructions (Signed)
Drink lots of water Take Septra DS every 12 hours for 5 days We are sending for a culture to make sure we have the correct antibiotic.

## 2014-08-02 ENCOUNTER — Other Ambulatory Visit (INDEPENDENT_AMBULATORY_CARE_PROVIDER_SITE_OTHER): Payer: Medicaid Other

## 2014-08-02 DIAGNOSIS — Z0000 Encounter for general adult medical examination without abnormal findings: Secondary | ICD-10-CM

## 2014-08-02 LAB — TSH: TSH: 2.152 u[IU]/mL (ref 0.350–4.500)

## 2014-08-03 ENCOUNTER — Telehealth: Payer: Self-pay

## 2014-08-03 ENCOUNTER — Other Ambulatory Visit: Payer: Self-pay | Admitting: Family Medicine

## 2014-08-03 LAB — URINE CULTURE: Colony Count: >=100000

## 2014-08-03 MED ORDER — CIPROFLOXACIN HCL 500 MG PO TABS: 500.0000 mg | ORAL_TABLET | Freq: Two times a day (BID) | ORAL | Status: DC

## 2014-08-03 NOTE — Telephone Encounter (Signed)
-----   Message from Micheline Chapman, NP sent at 08/03/2014  8:25 AM EDT ----- We do need to change your antibiotic for UTI. Will send in RX for Cipro for 3 days.

## 2014-08-03 NOTE — Telephone Encounter (Signed)
Called and advised patient of need to pick up need rx for cipro. Patient verbalized understanding. Thanks!

## 2014-08-09 ENCOUNTER — Ambulatory Visit (INDEPENDENT_AMBULATORY_CARE_PROVIDER_SITE_OTHER): Payer: Medicaid Other | Admitting: Family Medicine

## 2014-08-09 ENCOUNTER — Encounter: Payer: Self-pay | Admitting: Family Medicine

## 2014-08-09 VITALS — BP 132/71 | HR 77 | Temp 98.6°F | Resp 20 | Ht 64.0 in | Wt 134.0 lb

## 2014-08-09 DIAGNOSIS — E785 Hyperlipidemia, unspecified: Secondary | ICD-10-CM | POA: Diagnosis not present

## 2014-08-09 DIAGNOSIS — R7309 Other abnormal glucose: Secondary | ICD-10-CM

## 2014-08-09 DIAGNOSIS — R7303 Prediabetes: Secondary | ICD-10-CM

## 2014-08-09 LAB — HEMOGLOBIN A1C
Hgb A1c MFr Bld: 5.8 % — ABNORMAL HIGH (ref <5.7)
Mean Plasma Glucose: 120 mg/dL — ABNORMAL HIGH (ref <117)

## 2014-08-09 NOTE — Progress Notes (Signed)
Patient ID: Kelly Grant, female   DOB: 1965-12-27, 49 y.o.   MRN: [id number]   Kelly Grant, is a 49 y.o. female  ZGY:[id number]  [id number]:[id number]  DOB - May 10, 1965  CC: No chief complaint on file.      HPI: Kelly Grant is a 49 y.o. female here for follow-up of hyperlipidemia and prediabetes Her last visit for health maintenance was in January and she was instructed to follow-up on these issues in 6 months. She has a history of HSV infection and is on acyclovir prophalactally. She has a history of MS and is followed by neuro. Mammogram is still needed. She denies any c/o today. She had a TSH ordered by Mrs. Hollis and drawn on 7/11. This was within normal limits.   Allergies  Allergen Reactions  . Macrobid [Nitrofurantoin Macrocrystal] Hives   Past Medical History  Diagnosis Date  . Abnormal Pap smear     ASC-H  . HSV infection   . Yeast infection   . Chlamydia   . History of chicken pox   . Multiple sclerosis   . Neuromuscular disorder     MS   Current Outpatient Prescriptions on File Prior to Visit  Medication Sig Dispense Refill  . Calcium Carbonate-Vitamin D (CALCIUM 600 + D PO) Take 1 tablet by mouth daily.    . ciprofloxacin (CIPRO) 500 MG tablet Take 1 tablet (500 mg total) by mouth 2 (two) times daily. 6 tablet 0  . diclofenac sodium (VOLTAREN) 1 % GEL Apply topically as needed.    Marland Kitchen GILENYA 0.5 MG CAPS TAKE 1 CAPSULE BY MOUTH EVERY DAY 30 capsule 6  . sulfamethoxazole-trimethoprim (BACTRIM DS,SEPTRA DS) 800-160 MG per tablet Take 1 tablet by mouth 2 (two) times daily. 10 tablet 0  . valACYclovir (VALTREX) 500 MG tablet Take 1 tablet (500 mg total) by mouth daily. 90 tablet 2   No current facility-administered medications on file prior to visit.   Family History  Problem Relation Age of Onset  . Kidney failure Mother   . Diabetes Mother   . Colon cancer Neg Hx    History   Social History  . Marital Status: Single    Spouse Name: N/A  . Number of Children:  2  . Years of Education: College    Occupational History  . HOUSEKEEPING    Social History Main Topics  . Smoking status: Former Smoker    Quit date: 01/23/2000  . Smokeless tobacco: Never Used  . Alcohol Use: 0.0 oz/week    0 Standard drinks or equivalent per week     Comment: occasional  . Drug Use: No  . Sexual Activity:    Partners: Male    Patent examiner Protection: None, Surgical   Other Topics Concern  . Not on file   Social History Narrative   Patient lives at home with children.    Patient has 2 children.    Patient has a college degree.    Patient is single.    Patient is right handed.    Patient is currently employed.     Review of Systems: Constitutional: Negative for fever, chills, appetite change, weight loss,  fatigue. HENT: Negative for ear pain, ear discharge.nose bleeds Eyes: Negative for pain, discharge, redness, itching and visual disturbance. Neck: Negative for pain, stiffness Respiratory: Negative for cough, shortness of breath,   Cardiovascular: Negative for chest pain, palpitations and leg swelling. Gastrointestinal: Negative for abdominal distention, abdominal pain, nausea, vomiting, diarrhea, constipations Genitourinary: Negative for dysuria,  urgency, frequency, hematuria, flank pain,  Musculoskeletal: Negative for back pain, joint pain, joint  swelling, arthralgia and gait problem.Negative for weakness. Neurological: Negative for dizziness, tremors, seizures, syncope,   light-headedness, numbness and headaches.  Hematological: Negative for easy bruising or bleeding Psychiatric/Behavioral: Negative for depression, anxiety, decreased concentration, confusion    Objective:  There were no vitals filed for this visit.  Physical Exam: Constitutional: Patient appears well-developed and well-nourished. No distress. HENT: Normocephalic, atraumatic, External right and left ear normal. Oropharynx is clear and moist.  Eyes: Conjunctivae and EOM are  normal. PERRLA, no scleral icterus. Neck: Normal ROM. Neck supple. No lymphadenopathy, No thyromegaly. CVS: RRR, S1/S2 +, no murmurs, no gallops, no rubs Pulmonary: Effort and breath sounds normal, no stridor, rhonchi, wheezes, rales.  Abdominal: Soft. Normoactive BS,, no distension, tenderness, rebound or guarding.  Musculoskeletal: Normal range of motion. No edema and no tenderness.  Neuro: Alert.Normal muscle tone coordination. Non-focal Skin: Skin is warm and dry. No rash noted. Not diaphoretic. No erythema. No pallor. Psychiatric: Normal mood and affect. Behavior, judgment, thought content normal.  Lab Results  Component Value Date   WBC 2.5* 02/01/2014   HGB 13.4 02/01/2014   HCT 39.8 02/01/2014   MCV 95.7 02/01/2014   PLT 288 02/01/2014   Lab Results  Component Value Date   CREATININE 0.72 02/01/2014   BUN 12 02/01/2014   NA 143 02/01/2014   K 3.8 02/01/2014   CL 105 02/01/2014   CO2 28 02/01/2014    Lab Results  Component Value Date   HGBA1C 5.9* 02/01/2014   Lipid Panel     Component Value Date/Time   CHOL 241* 02/01/2014 0957   TRIG 118 02/01/2014 0957   HDL 65 02/01/2014 0957   CHOLHDL 3.7 02/01/2014 0957   VLDL 24 02/01/2014 0957   LDLCALC 152* 02/01/2014 0957       Assessment and plan:   Hyperlipidemia -Repeat lipid panel as she has been watching fats and cholesterol and carbs in diet -continue healthy diet -will inform her if she needs treatment  -Prediabetes -Repeat A1C -will inform if treatment needed. Hopefully with her change in diet this will not be necessary.   6 months for routine follow-up chronic conditions.  The patient was given clear instructions to go to ER or return to medical center if symptoms don't improve, worsen or new problems develop. The patient verbalized understanding. The patient was told to call to get lab results if they haven't heard anything in the next week.       Micheline Chapman, MSN, FNP-BC   08/09/2014,  3:03 PM

## 2014-08-09 NOTE — Patient Instructions (Signed)
Continue to be careful of carbs, fats and cholesterol. We will call to let you know if you need medication Hopefully since you have been careful with your diet you will not.

## 2014-08-10 LAB — LIPID PANEL
Cholesterol: 208 mg/dL — ABNORMAL HIGH (ref 0–200)
HDL: 59 mg/dL (ref >=46)
LDL Cholesterol: 104 mg/dL — ABNORMAL HIGH (ref 0–99)
Total CHOL/HDL Ratio: 3.5 Ratio
Triglycerides: 226 mg/dL — ABNORMAL HIGH (ref <150)
VLDL: 45 mg/dL — ABNORMAL HIGH (ref 0–40)

## 2014-09-16 ENCOUNTER — Other Ambulatory Visit: Payer: Self-pay | Admitting: Neurology

## 2014-10-04 ENCOUNTER — Telehealth: Payer: Self-pay | Admitting: Family Medicine

## 2014-10-04 ENCOUNTER — Other Ambulatory Visit: Payer: Self-pay | Admitting: Family Medicine

## 2014-10-04 DIAGNOSIS — Z0000 Encounter for general adult medical examination without abnormal findings: Secondary | ICD-10-CM

## 2014-10-04 NOTE — Telephone Encounter (Signed)
Patient would like referral for a mammogram to [facility] [address].

## 2014-10-04 NOTE — Telephone Encounter (Signed)
Kelly Grant,  Can you please put in a order for patient to have a mammogram? Please advise. Thanks!

## 2014-10-08 ENCOUNTER — Ambulatory Visit (INDEPENDENT_AMBULATORY_CARE_PROVIDER_SITE_OTHER): Payer: Medicaid Other

## 2014-10-08 DIAGNOSIS — Z23 Encounter for immunization: Secondary | ICD-10-CM | POA: Diagnosis not present

## 2014-11-01 ENCOUNTER — Other Ambulatory Visit: Payer: Self-pay | Admitting: Family Medicine

## 2014-11-01 DIAGNOSIS — B009 Herpesviral infection, unspecified: Secondary | ICD-10-CM

## 2014-11-01 MED ORDER — VALACYCLOVIR HCL 500 MG PO TABS: 500.0000 mg | ORAL_TABLET | Freq: Every day | ORAL | Status: DC

## 2014-11-01 NOTE — Telephone Encounter (Signed)
Refill sent into pharmacy. Thanks!  

## 2014-11-03 ENCOUNTER — Other Ambulatory Visit: Payer: Self-pay

## 2014-11-03 DIAGNOSIS — B009 Herpesviral infection, unspecified: Secondary | ICD-10-CM

## 2014-11-03 MED ORDER — VALACYCLOVIR HCL 500 MG PO TABS: 500.0000 mg | ORAL_TABLET | Freq: Every day | ORAL | Status: DC

## 2014-12-13 ENCOUNTER — Ambulatory Visit (INDEPENDENT_AMBULATORY_CARE_PROVIDER_SITE_OTHER): Payer: Medicaid Other | Admitting: Nurse Practitioner

## 2014-12-13 ENCOUNTER — Encounter: Payer: Self-pay | Admitting: Nurse Practitioner

## 2014-12-13 VITALS — BP 116/66 | HR 64 | Ht 64.0 in | Wt 135.0 lb

## 2014-12-13 DIAGNOSIS — G35 Multiple sclerosis: Secondary | ICD-10-CM | POA: Diagnosis not present

## 2014-12-13 DIAGNOSIS — Z5181 Encounter for therapeutic drug level monitoring: Secondary | ICD-10-CM

## 2014-12-13 MED ORDER — FINGOLIMOD HCL 0.5 MG PO CAPS: 1.0000 | ORAL_CAPSULE | Freq: Every day | ORAL | Status: DC

## 2014-12-13 NOTE — Patient Instructions (Signed)
Will obtain CBC today Continue Gilenya at current dose Exercise by walking for overall health and well-being Follow-up in 6 months

## 2014-12-13 NOTE — Progress Notes (Signed)
[facility]  PATIENT: Kelly Grant DOB: [birth year]   REASON FOR VISIT: Follow-up for multiple sclerosis HISTORY FROM: Patient    HISTORY OF PRESENT ILLNESS:Kelly Grant,49 year old female returns for followup. She was last seen in this office in 05/12/14. She has a history of multiple sclerosis relapsing remitting. Initial symptoms were right optic neuritis in the 1980s then in May 2006 a left optic neuritis. MRI of the brain at that time showed typical MS lesions. She is currently on Gilenya tolerating the medication without difficulty. Today she denies spasms, focal weakness, sensory changes, visual changes, speech or swallowing problems, no problems with bowel or bladder function. No exacerbation since last seen, and no new neurologic complaints. Most recent MRI of the brain performed 11/27/2012 was abnormal showing extensive bilateral subcortical and ventricular white matter hyperintensities compatible with chronic demyelinating plaques. No enhancing lesions are seen, presence of atrophy of the corpus colossal and multiple T1 black holes indicate chronic disease. Compared to the MRI July 2006 white matter disease burden is slightly progressed while atrophy and T1 black holes are much more advanced. She denies any recent falls. She returns for reevaluation.    REVIEW OF SYSTEMS: Full 14 system review of systems performed and notable only for those listed, all others are neg:  Constitutional: neg  Cardiovascular: neg Ear/Nose/Throat: neg  Skin: neg Eyes: neg Respiratory: neg Gastroitestinal: neg  Hematology/Lymphatic: neg  Endocrine: neg Musculoskeletal:neg Allergy/Immunology: neg Neurological: neg Psychiatric: neg Sleep : neg   ALLERGIES: Allergies  Allergen Reactions  . Macrobid [Nitrofurantoin Macrocrystal] Hives    HOME MEDICATIONS: Outpatient Prescriptions Prior to Visit  Medication Sig Dispense Refill  . diclofenac sodium (VOLTAREN) 1 % GEL Apply  topically as needed.    Marland Kitchen GILENYA 0.5 MG CAPS TAKE 1 CAPSULE BY MOUTH DAILY 30 capsule 3  . valACYclovir (VALTREX) 500 MG tablet Take 1 tablet (500 mg total) by mouth daily. 90 tablet 2  . Calcium Carbonate-Vitamin D (CALCIUM 600 + D PO) Take 1 tablet by mouth daily.    . ciprofloxacin (CIPRO) 500 MG tablet Take 1 tablet (500 mg total) by mouth 2 (two) times daily. (Patient not taking: Reported on 12/13/2014) 6 tablet 0  . sulfamethoxazole-trimethoprim (BACTRIM DS,SEPTRA DS) 800-160 MG per tablet Take 1 tablet by mouth 2 (two) times daily. (Patient not taking: Reported on 12/13/2014) 10 tablet 0   No facility-administered medications prior to visit.    PAST MEDICAL HISTORY: Past Medical History  Diagnosis Date  . Abnormal Pap smear     ASC-H  . HSV infection   . Yeast infection   . Chlamydia   . History of chicken pox   . Multiple sclerosis ([place])   . Neuromuscular disorder ([place])     MS    PAST SURGICAL HISTORY: Past Surgical History  Procedure Laterality Date  . Tubal ligation    . Cesarean section      x2  . Carpal tunnel release    . Incision and drainage abscess      left buttock    FAMILY HISTORY: Family History  Problem Relation Age of Onset  . Kidney failure Mother   . Diabetes Mother   . Colon cancer Neg Hx     SOCIAL HISTORY: Social History   Social History  . Marital Status: Single    Spouse Name: N/A  . Number of Children: 2  . Years of Education: College    Occupational History  . HOUSEKEEPING    Social History Main  Topics  . Smoking status: Former Smoker    Quit date: 01/23/2000  . Smokeless tobacco: Never Used  . Alcohol Use: 0.0 oz/week    0 Standard drinks or equivalent per week     Comment: occasional  . Drug Use: No  . Sexual Activity:    Partners: Male    Patent examiner Protection: None, Surgical   Other Topics Concern  . Not on file   Social History Narrative   Patient lives at home with children.    Patient has 2 children.      Patient has a college degree.    Patient is single.    Patient is right handed.    Patient is currently employed.      PHYSICAL EXAM  Filed Vitals:   12/13/14 0944  BP: 116/66  Pulse: 64  Height: 5\' 4"  (1.626 m)  Weight: 135 lb (61.236 kg)   Body mass index is 23.16 kg/(m^2). Generalized: Well developed, in no acute distress  Head: normocephalic and atraumatic,. Oropharynx benign  Neck: Supple, no carotid bruits  Cardiac: Regular rate rhythm, no murmur  Musculoskeletal: No deformity  Neurological examination  Mentation: Alert oriented to time, place, history taking. Follows all commands speech and language fluent  Cranial nerve II-XII: Visual acuity 20/50 right 20/50 left. Pupils were equal round reactive to light extraocular movements were full, visual field were full on confrontational test. Facial sensation and strength were normal. hearing was intact to finger rubbing bilaterally. Uvula tongue midline. head turning and shoulder shrug were normal and symmetric.Tongue protrusion into cheek strength was normal.  Motor: normal bulk and tone, full strength in the BUE, BLE, fine finger movements normal, no pronator drift. No focal weakness  Sensory: normal and symmetric to light touch, pinprick, and vibration  Coordination: finger-nose-finger, heel-to-shin bilaterally, no dysmetria  Reflexes: Brachioradialis 2/2, biceps 2/2, triceps 2/2, patellar 2/2, Achilles 2/2, plantar responses were flexor bilaterally.  Gait and Station: Rising up from seated position without assistance, normal stance, moderate stride, good arm swing, smooth turning, able to perform tiptoe, and heel walking without difficulty. Tandem gait is steady  DIAGNOSTIC DATA (LABS, IMAGING, TESTING) - I reviewed patient records, labs, notes, testing and imaging myself where available.  Lab Results  Component Value Date   WBC 2.5* 02/01/2014   HGB 13.4 02/01/2014   HCT 39.8 02/01/2014   MCV 95.7  02/01/2014   PLT 288 02/01/2014      Component Value Date/Time   NA 143 02/01/2014 0957   NA 142 11/09/2013 1414   K 3.8 02/01/2014 0957   CL 105 02/01/2014 0957   CO2 28 02/01/2014 0957   GLUCOSE 93 02/01/2014 0957   GLUCOSE 93 11/09/2013 1414   BUN 12 02/01/2014 0957   BUN 11 11/09/2013 1414   CREATININE 0.72 02/01/2014 0957   CREATININE 0.73 11/09/2013 1414   CALCIUM 9.3 02/01/2014 0957   PROT 6.2 02/01/2014 0957   PROT 6.2 11/09/2013 1414   ALBUMIN 4.0 02/01/2014 0957   ALBUMIN 4.1 11/09/2013 1414   AST 24 02/01/2014 0957   ALT 26 02/01/2014 0957   ALKPHOS 69 02/01/2014 0957   BILITOT 0.6 02/01/2014 0957   GFRNONAA >89 02/01/2014 0957   GFRNONAA 97 11/09/2013 1414   GFRAA >89 02/01/2014 0957   GFRAA 112 11/09/2013 1414   Lab Results  Component Value Date   CHOL 208* 08/09/2014   HDL 59 08/09/2014   LDLCALC 104* 08/09/2014   TRIG 226* 08/09/2014   CHOLHDL 3.5 08/09/2014  Lab Results  Component Value Date   HGBA1C 5.8* 08/09/2014   No results found for: [id number] Lab Results  Component Value Date   TSH 2.152 08/02/2014      ASSESSMENT AND PLAN  49 y.o. year old female  has a past medical history of  Multiple sclerosis ([place]); and Neuromuscular disorder ([place]). here to follow-up. She has not had an exacerbation of her MS symptoms since last seen. She is currently on Gilenya without side effects. Most recent CBC in January 2016  Will obtain CBC today to monitor for adverse effects of Gilenya Continue Gilenya at current dose Exercise by walking for overall health and well-being Follow-up in 6 months Dennie Bible, [facility], [facility], [facility] [address], [place], [place] [address] [phone]

## 2014-12-14 ENCOUNTER — Telehealth: Payer: Self-pay | Admitting: *Deleted

## 2014-12-14 LAB — CBC WITH DIFFERENTIAL/PLATELET
Basophils Absolute: 0 10*3/uL (ref 0.0–0.2)
Basos: 0 %
EOS (ABSOLUTE): 0 10*3/uL (ref 0.0–0.4)
Eos: 1 %
Hematocrit: 38.7 % (ref 34.0–46.6)
Hemoglobin: 12.7 g/dL (ref 11.1–15.9)
Immature Grans (Abs): 0 10*3/uL (ref 0.0–0.1)
Immature Granulocytes: 0 %
Lymphocytes Absolute: 0.6 10*3/uL — ABNORMAL LOW (ref 0.7–3.1)
Lymphs: 14 %
MCH: 33 pg (ref 26.6–33.0)
MCHC: 32.8 g/dL (ref 31.5–35.7)
MCV: 101 fL — ABNORMAL HIGH (ref 79–97)
Monocytes Absolute: 0.4 10*3/uL (ref 0.1–0.9)
Monocytes: 10 %
Neutrophils Absolute: 3.1 10*3/uL (ref 1.4–7.0)
Neutrophils: 75 %
Platelets: 262 10*3/uL (ref 150–379)
RBC: 3.85 x10E6/uL (ref 3.77–5.28)
RDW: 14.2 % (ref 12.3–15.4)
WBC: 4.1 10*3/uL (ref 3.4–10.8)

## 2014-12-14 NOTE — Telephone Encounter (Signed)
-----   Message from Dennie Bible, NP sent at 12/14/2014  7:53 AM EST ----- Labs okay please call the patient

## 2014-12-14 NOTE — Telephone Encounter (Signed)
I spoke to pt and let her know that her lab results looked ok.  She verbalized understanding.

## 2014-12-15 NOTE — Progress Notes (Signed)
I have reviewed and agreed above plan. 

## 2014-12-28 ENCOUNTER — Ambulatory Visit (INDEPENDENT_AMBULATORY_CARE_PROVIDER_SITE_OTHER): Payer: Medicaid Other | Admitting: Obstetrics

## 2014-12-28 ENCOUNTER — Encounter: Payer: Self-pay | Admitting: Obstetrics

## 2014-12-28 DIAGNOSIS — Z0000 Encounter for general adult medical examination without abnormal findings: Secondary | ICD-10-CM

## 2014-12-28 DIAGNOSIS — Z780 Asymptomatic menopausal state: Secondary | ICD-10-CM

## 2014-12-28 DIAGNOSIS — Z01419 Encounter for gynecological examination (general) (routine) without abnormal findings: Secondary | ICD-10-CM

## 2014-12-28 NOTE — Progress Notes (Signed)
Subjective:        Kelly Grant is a 49 y.o. female here for a routine exam.  Current complaints: none.    Personal health questionnaire:  Is patient Kelly Grant, have a family history of breast and/or ovarian cancer: no Is there a family history of uterine cancer diagnosed at age < 69, gastrointestinal cancer, urinary tract cancer, family member who is a Field seismologist syndrome-associated carrier: no Is the patient overweight and hypertensive, family history of diabetes, personal history of gestational diabetes, preeclampsia or PCOS: no Is patient over 31, have PCOS,  family history of premature CHD under age 12, diabetes, smoke, have hypertension or peripheral artery disease:  no At any time, has a partner hit, kicked or otherwise hurt or frightened you?: no Over the past 2 weeks, have you felt down, depressed or hopeless?: no Over the past 2 weeks, have you felt little interest or pleasure in doing things?:no   Gynecologic History No LMP recorded. Patient is not currently having periods (Reason: Perimenopausal). Contraception: post menopausal status Last Pap: 2015. Results were: normal Last mammogram: 2016. Results were: normal  Obstetric History OB History  Gravida Para Term Preterm AB SAB TAB Ectopic Multiple Living  3 2            # Outcome Date GA Lbr Len/2nd Weight Sex Delivery Anes PTL Lv  3 Para           2 Para           1 Gravida               Past Medical History  Diagnosis Date  . Abnormal Pap smear     ASC-H  . HSV infection   . Yeast infection   . Chlamydia   . History of chicken pox   . Multiple sclerosis ([place])   . Neuromuscular disorder [facility])     MS    Past Surgical History  Procedure Laterality Date  . Tubal ligation    . Cesarean section      x2  . Carpal tunnel release    . Incision and drainage abscess      left buttock     Current outpatient prescriptions:  .  diclofenac sodium (VOLTAREN) 1 % GEL, Apply topically as needed., Disp: ,  Rfl:  .  Fingolimod HCl (GILENYA) 0.5 MG CAPS, Take 1 capsule (0.5 mg total) by mouth daily., Disp: 30 capsule, Rfl: 6 .  Multiple Vitamin (MULTIVITAMIN) tablet, Take 1 tablet by mouth daily., Disp: , Rfl:  .  valACYclovir (VALTREX) 500 MG tablet, Take 1 tablet (500 mg total) by mouth daily., Disp: 90 tablet, Rfl: 2 .  Wheat Dextrin (BENEFIBER DRINK MIX PO), Take by mouth. 2 tsp TID, Disp: , Rfl:  Allergies  Allergen Reactions  . Macrobid [Nitrofurantoin Macrocrystal] Hives    Social History  Substance Use Topics  . Smoking status: Former Smoker    Quit date: 01/23/2000  . Smokeless tobacco: Never Used  . Alcohol Use: 0.0 oz/week    0 Standard drinks or equivalent per week     Comment: occasional    Family History  Problem Relation Age of Onset  . Kidney failure Mother   . Diabetes Mother   . Colon cancer Neg Hx       Review of Systems  Constitutional: negative for fatigue and weight loss Respiratory: negative for cough and wheezing Cardiovascular: negative for chest pain, fatigue and palpitations Gastrointestinal: negative for abdominal pain and  change in bowel habits Musculoskeletal:negative for myalgias Neurological: negative for gait problems and tremors Behavioral/Psych: negative for abusive relationship, depression Endocrine: negative for temperature intolerance   Genitourinary:negative for abnormal menstrual periods, genital lesions, hot flashes, sexual problems and vaginal discharge Integument/breast: negative for breast lump, breast tenderness, nipple discharge and skin lesion(s)    Objective:       BP 123/78 mmHg  Pulse 78  Temp(Src) 98.2 F (36.8 C)  Wt 135 lb (61.236 kg) General:   alert  Skin:   no rash or abnormalities  Lungs:   clear to auscultation bilaterally  Heart:   regular rate and rhythm, S1, S2 normal, no murmur, click, rub or gallop  Breasts:   normal without suspicious masses, skin or nipple changes or axillary nodes  Abdomen:  normal  findings: no organomegaly, soft, non-tender and no hernia  Pelvis:  External genitalia: normal general appearance Urinary system: urethral meatus normal and bladder without fullness, nontender Vaginal: normal without tenderness, induration or masses Cervix: normal appearance Adnexa: normal bimanual exam Uterus: anteverted and non-tender, normal size   Lab Review Urine pregnancy test Labs reviewed yes Radiologic studies reviewed yes    Assessment:    Healthy female exam.    Postmenopausal.  Doing well.   Plan:    Education reviewed: calcium supplements, low fat, low cholesterol diet, self breast exams and weight bearing exercise. Follow up in: 3 years. Bone Density Study ordered.   No orders of the defined types were placed in this encounter.   Orders Placed This Encounter  Procedures  . SureSwab, Vaginosis/Vaginitis Plus  . DG Bone Density    Standing Status: Future     Number of Occurrences:      Standing Expiration Date: 02/28/2016    Order Specific Question:  Reason for Exam (SYMPTOM  OR DIAGNOSIS REQUIRED)    Answer:  Estrogen Deficiency    Order Specific Question:  Is the patient pregnant?    Answer:  No    Order Specific Question:  Preferred imaging location?    Answer:  [facility]

## 2014-12-30 LAB — PAP, TP IMAGING W/ HPV RNA, RFLX HPV TYPE 16,18/45: HPV mRNA, High Risk: DETECTED — AB

## 2015-01-01 ENCOUNTER — Other Ambulatory Visit: Payer: Self-pay | Admitting: Obstetrics

## 2015-01-01 DIAGNOSIS — N760 Acute vaginitis: Principal | ICD-10-CM

## 2015-01-01 DIAGNOSIS — B9689 Other specified bacterial agents as the cause of diseases classified elsewhere: Secondary | ICD-10-CM

## 2015-01-01 LAB — SURESWAB, VAGINOSIS/VAGINITIS PLUS
Atopobium vaginae: NOT DETECTED Log (cells/mL)
BV CATEGORY: UNDETERMINED — AB
C. albicans, DNA: NOT DETECTED
C. glabrata, DNA: NOT DETECTED
C. parapsilosis, DNA: NOT DETECTED
C. trachomatis RNA, TMA: NOT DETECTED
C. tropicalis, DNA: NOT DETECTED
Gardnerella vaginalis: 7.4 Log (cells/mL)
LACTOBACILLUS SPECIES: 7.4 Log (cells/mL)
MEGASPHAERA SPECIES: NOT DETECTED Log (cells/mL)
N. gonorrhoeae RNA, TMA: NOT DETECTED
T. vaginalis RNA, QL TMA: NOT DETECTED

## 2015-01-01 MED ORDER — METRONIDAZOLE 500 MG PO TABS: 500.0000 mg | ORAL_TABLET | Freq: Two times a day (BID) | ORAL | Status: DC

## 2015-01-05 LAB — HPV TYPE 16 AND 18/45 RNA
HPV Type 16 RNA: NOT DETECTED
HPV Type 18/45 RNA: NOT DETECTED

## 2015-01-10 ENCOUNTER — Encounter: Payer: Self-pay | Admitting: Internal Medicine

## 2015-01-31 ENCOUNTER — Other Ambulatory Visit: Payer: Medicaid Other

## 2015-02-01 ENCOUNTER — Ambulatory Visit
Admission: RE | Admit: 2015-02-01 | Discharge: 2015-02-01 | Disposition: A | Discharge status: Home / Self Care | Payer: Medicaid Other | Source: Ambulatory Visit | Attending: Obstetrics | Admitting: Obstetrics

## 2015-02-01 DIAGNOSIS — Z780 Asymptomatic menopausal state: Secondary | ICD-10-CM

## 2015-02-09 ENCOUNTER — Ambulatory Visit: Payer: Medicaid Other | Admitting: Family Medicine

## 2015-02-17 ENCOUNTER — Ambulatory Visit (INDEPENDENT_AMBULATORY_CARE_PROVIDER_SITE_OTHER): Payer: Medicaid Other | Admitting: Family Medicine

## 2015-02-17 ENCOUNTER — Encounter: Payer: Self-pay | Admitting: Family Medicine

## 2015-02-17 VITALS — BP 109/70 | HR 70 | Temp 98.4°F | Ht 64.0 in | Wt 136.0 lb

## 2015-02-17 DIAGNOSIS — R7303 Prediabetes: Secondary | ICD-10-CM

## 2015-02-17 DIAGNOSIS — B009 Herpesviral infection, unspecified: Secondary | ICD-10-CM

## 2015-02-17 LAB — LIPID PANEL
Cholesterol: 216 mg/dL — ABNORMAL HIGH (ref 125–200)
HDL: 59 mg/dL (ref >=46)
LDL Cholesterol: 131 mg/dL — ABNORMAL HIGH (ref <130)
Total CHOL/HDL Ratio: 3.7 Ratio (ref <=5.0)
Triglycerides: 131 mg/dL (ref <150)
VLDL: 26 mg/dL (ref <30)

## 2015-02-17 LAB — COMPLETE METABOLIC PANEL WITH GFR
ALT: 30 U/L — ABNORMAL HIGH (ref 6–29)
AST: 27 U/L (ref 10–35)
Albumin: 3.9 g/dL (ref 3.6–5.1)
Alkaline Phosphatase: 58 U/L (ref 33–130)
BUN: 11 mg/dL (ref 7–25)
CO2: 29 mmol/L (ref 20–31)
Calcium: 8.9 mg/dL (ref 8.6–10.4)
Chloride: 106 mmol/L (ref 98–110)
Creat: 0.77 mg/dL (ref 0.50–1.05)
GFR, Est African American: >89 mL/min (ref >=60)
GFR, Est Non African American: >89 mL/min (ref >=60)
Glucose, Bld: 80 mg/dL (ref 65–99)
Potassium: 4 mmol/L (ref 3.5–5.3)
Sodium: 144 mmol/L (ref 135–146)
Total Bilirubin: 0.5 mg/dL (ref 0.2–1.2)
Total Protein: 5.9 g/dL — ABNORMAL LOW (ref 6.1–8.1)

## 2015-02-17 LAB — HEMOGLOBIN A1C
Hgb A1c MFr Bld: 5.7 % — ABNORMAL HIGH (ref <5.7)
Mean Plasma Glucose: 117 mg/dL — ABNORMAL HIGH (ref <117)

## 2015-02-17 MED ORDER — VALACYCLOVIR HCL 500 MG PO TABS: 500.0000 mg | ORAL_TABLET | Freq: Every day | ORAL | Status: DC

## 2015-02-17 NOTE — Progress Notes (Signed)
Patient ID: Kelly Grant, female   Kelly: [birth year], 50 y.o.   MRN: Kelly Grant   Kelly Grant, is a 50 y.o. female  Kelly Grant  Kelly Grant  Kelly Grant  CC:  Chief Complaint  Patient presents with  . Follow-up    doing well, no medication problems.       HPI: Kelly Grant is a 50 y.o. female here for follow-up.  I saw her about 6 months ago and this is a routine follow-up of that visit. At that time she was diagnosed with pre-diabetes and her cholesterol was elevated. I advised her on diet and exercise and requested follow-up for repeat blood work in 6 months. She reports she is doing well and has no specific complaints. Her review of systems is negative and is recorded below. She does need a refill on her valtrex for HSV.  Allergies  Allergen Reactions  . Macrobid [Nitrofurantoin Macrocrystal] Hives   Past Medical History  Diagnosis Date  . Abnormal Pap smear     ASC-H  . HSV infection   . Yeast infection   . Chlamydia   . History of chicken pox   . Multiple sclerosis ([place])   . Neuromuscular disorder [facility])     MS   Current Outpatient Prescriptions on File Prior to Visit  Medication Sig Dispense Refill  . diclofenac sodium (VOLTAREN) 1 % GEL Apply topically as needed.    . Fingolimod HCl (GILENYA) 0.5 MG CAPS Take 1 capsule (0.5 mg total) by mouth daily. 30 capsule 6  . Multiple Vitamin (MULTIVITAMIN) tablet Take 1 tablet by mouth daily.    . valACYclovir (VALTREX) 500 MG tablet Take 1 tablet (500 mg total) by mouth daily. 90 tablet 2  . Wheat Dextrin (BENEFIBER DRINK MIX PO) Take by mouth. 2 tsp TID    . metroNIDAZOLE (FLAGYL) 500 MG tablet Take 1 tablet (500 mg total) by mouth 2 (two) times daily. (Patient not taking: Reported on 02/17/2015) 14 tablet 2   No current facility-administered medications on file prior to visit.   Family History  Problem Relation Age of Onset  . Kidney failure Mother   . Diabetes Mother   . Colon cancer Neg Hx    Social  History   Social History  . Marital Status: Single    Spouse Name: N/A  . Number of Children: 2  . Years of Education: College    Occupational History  . HOUSEKEEPING    Social History Main Topics  . Smoking status: Former Smoker    Quit date: 01/23/2000  . Smokeless tobacco: Never Used  . Alcohol Use: 0.0 oz/week    0 Standard drinks or equivalent per week     Comment: occasional  . Drug Use: No  . Sexual Activity:    Partners: Male    Patent examiner Protection: None, Surgical   Other Topics Concern  . Not on file   Social History Narrative   Patient lives at home with children.    Patient has 2 children.    Patient has a college degree.    Patient is single.    Patient is right handed.    Patient is currently employed.     Review of Systems: Constitutional: Negative for fever, chills, appetite change, weight loss,  Fatigue. Skin: Negative for rashes or lesions of concern. HENT: Negative for ear pain, ear discharge.nose bleeds Eyes: Negative for pain, discharge, redness, itching and visual disturbance. Neck: Negative for pain, stiffness Respiratory: Negative for cough, shortness  of breath,   Cardiovascular: Negative for chest pain, palpitations and leg swelling. Gastrointestinal: Negative for abdominal pain, nausea, vomiting, diarrhea, constipations Genitourinary: Negative for dysuria, urgency, frequency, hematuria,  Musculoskeletal: Negative for back pain, joint pain, joint  swelling, and gait problem.Negative for weakness. Neurological: Negative for dizziness, tremors, seizures, syncope,   light-headedness, numbness and headaches.  Hematological: Negative for easy bruising or bleeding Psychiatric/Behavioral: Negative for depression, anxiety, decreased concentration, confusion   Objective:   Filed Vitals:   02/17/15 1325  BP: 109/70  Pulse: 70  Temp: 98.4 F (36.9 C)    Physical Exam: Constitutional: Patient appears well-developed and well-nourished. No  distress. HENT: Normocephalic, atraumatic, External right and left ear normal. Oropharynx is clear and moist.  Eyes: Conjunctivae and EOM are normal. PERRLA, no scleral icterus. Neck: Normal ROM. Neck supple. No lymphadenopathy, No thyromegaly. CVS: RRR, S1/S2 +, no murmurs, no gallops, no rubs Pulmonary: Effort and breath sounds normal, no stridor, rhonchi, wheezes, rales.  Abdominal: Soft. Normoactive BS,, no distension, tenderness, rebound or guarding.  Musculoskeletal: Normal range of motion. No edema and no tenderness.  Neuro: Alert.Normal muscle tone coordination. Non-focal Skin: Skin is warm and dry. No rash noted. Not diaphoretic. No erythema. No pallor. Psychiatric: Normal mood and affect. Behavior, judgment, thought content normal.  Lab Results  Component Value Date   WBC 4.1 12/13/2014   HGB 13.4 02/01/2014   HCT 38.7 12/13/2014   MCV 101* 12/13/2014   PLT 262 12/13/2014   Lab Results  Component Value Date   CREATININE 0.72 02/01/2014   BUN 12 02/01/2014   NA 143 02/01/2014   K 3.8 02/01/2014   CL 105 02/01/2014   CO2 28 02/01/2014    Lab Results  Component Value Date   HGBA1C 5.8* 08/09/2014   Lipid Panel     Component Value Date/Time   CHOL 208* 08/09/2014 1525   TRIG 226* 08/09/2014 1525   HDL 59 08/09/2014 1525   CHOLHDL 3.5 08/09/2014 1525   VLDL 45* 08/09/2014 1525   LDLCALC 104* 08/09/2014 1525       Assessment and plan:   1. Prediabetes  - COMPLETE METABOLIC PANEL WITH GFR - Lipid panel - Hemoglobin A1c -Will let her know of the results.   Return in about 1 year (around 02/17/2016) for A1C.  The patient was given clear instructions to go to ER or return to medical center if symptoms don't improve, worsen or new problems develop. The patient verbalized understanding.    Micheline Chapman FNP  02/17/2015, 3:20 PM

## 2015-02-17 NOTE — Patient Instructions (Signed)
Continue be careful with diet and exercise regularly [facility] back in one year and sooner if you needed.

## 2015-02-21 ENCOUNTER — Telehealth: Payer: Self-pay

## 2015-02-21 NOTE — Telephone Encounter (Signed)
Patient called back and I spoke with her regarding her lab results. I advised of results and to modify diet, to eat low fat/ low carb/ low cholesterol. Asked patient to try and exercise weekly as tolerated and to drink 6 to 8 glasses of water daily. Thanks!

## 2015-02-21 NOTE — Progress Notes (Signed)
LM to [facility] 

## 2015-04-29 ENCOUNTER — Telehealth: Payer: Self-pay

## 2015-04-29 MED ORDER — ONE-DAILY MULTI VITAMINS PO TABS: 1.0000 | ORAL_TABLET | Freq: Every day | ORAL | Status: AC

## 2015-04-29 NOTE — Telephone Encounter (Signed)
Refill for valtrex sent into pharmacy. Thanks!  

## 2015-04-29 NOTE — Telephone Encounter (Signed)
Pt called requesting a medication refill for valtrex. Thanks!

## 2015-06-13 ENCOUNTER — Ambulatory Visit: Payer: Medicaid Other | Admitting: Nurse Practitioner

## 2015-06-21 ENCOUNTER — Ambulatory Visit (INDEPENDENT_AMBULATORY_CARE_PROVIDER_SITE_OTHER): Payer: Medicaid Other | Admitting: Nurse Practitioner

## 2015-06-21 ENCOUNTER — Encounter: Payer: Self-pay | Admitting: Nurse Practitioner

## 2015-06-21 VITALS — BP 116/74 | HR 80 | Ht 64.0 in | Wt 134.4 lb

## 2015-06-21 DIAGNOSIS — G35 Multiple sclerosis: Secondary | ICD-10-CM

## 2015-06-21 DIAGNOSIS — Z5181 Encounter for therapeutic drug level monitoring: Secondary | ICD-10-CM | POA: Diagnosis not present

## 2015-06-21 NOTE — Patient Instructions (Signed)
Will obtain CBC today to monitor for adverse effects of Gilenya Continue Gilenya at current dose Exercise by walking for overall health and well-being Follow-up in 6 months

## 2015-06-21 NOTE — Progress Notes (Signed)
I have reviewed and agreed above plan. 

## 2015-06-21 NOTE — Progress Notes (Signed)
[facility]  PATIENT: Kelly Grant DOB: 1965-11-08   REASON FOR VISIT: Follow-up for multiple sclerosis, relapsing remitting HISTORY FROM: Patient    HISTORY OF PRESENT ILLNESS: Kelly Grant,50 year old female returns for followup. She was last seen in this office in 12/13/14.  She has a history of multiple sclerosis relapsing remitting. Initial symptoms were right optic neuritis in the 1980s then in May 2006 a left optic neuritis. MRI of the brain at that time showed typical MS lesions. She is currently on Gilenya tolerating the medication without difficulty. Today she denies spasms, focal weakness, sensory changes, visual changes, speech or swallowing problems, no problems with bowel or bladder function. No exacerbation since last seen, and no new neurologic complaints. Most recent MRI of the brain performed 11/27/2012 was abnormal showing extensive bilateral subcortical and ventricular white matter hyperintensities compatible with chronic demyelinating plaques. No enhancing lesions are seen, presence of atrophy of the corpus colossal and multiple T1 black holes indicate chronic disease. Compared to the MRI July 2006 white matter disease burden is slightly progressed while atrophy and T1 black holes are much more advanced. She denies any recent falls. She continues to work. She returns for reevaluation.   REVIEW OF SYSTEMS: Full 14 system review of systems performed and notable only for those listed, all others are neg:  Constitutional: neg  Cardiovascular: neg Ear/Nose/Throat: neg  Skin: neg Eyes: neg Respiratory: neg Gastroitestinal: neg  Hematology/Lymphatic: neg  Endocrine: neg Musculoskeletal:neg Allergy/Immunology: neg Neurological: neg Psychiatric: neg Sleep : neg   ALLERGIES: Allergies  Allergen Reactions  . Macrobid [Nitrofurantoin Macrocrystal] Hives    HOME MEDICATIONS: Outpatient Prescriptions Prior to Visit  Medication Sig Dispense Refill    . calcium-vitamin D (OSCAL WITH D) 500-200 MG-UNIT tablet Take 1 tablet by mouth.    . diclofenac sodium (VOLTAREN) 1 % GEL Apply topically as needed.    . Fingolimod HCl (GILENYA) 0.5 MG CAPS Take 1 capsule (0.5 mg total) by mouth daily. 30 capsule 6  . Multiple Vitamin (MULTIVITAMIN) tablet Take 1 tablet by mouth daily. 30 tablet 3  . valACYclovir (VALTREX) 500 MG tablet Take 1 tablet (500 mg total) by mouth daily. 90 tablet 2  . Wheat Dextrin (BENEFIBER DRINK MIX PO) Take by mouth. 2 tsp TID    . metroNIDAZOLE (FLAGYL) 500 MG tablet Take 1 tablet (500 mg total) by mouth 2 (two) times daily. (Patient not taking: Reported on 02/17/2015) 14 tablet 2   No facility-administered medications prior to visit.    PAST MEDICAL HISTORY: Past Medical History  Diagnosis Date  . Abnormal Pap smear     ASC-H  . HSV infection   . Yeast infection   . Chlamydia   . History of chicken pox   . Multiple sclerosis ([place])   . Neuromuscular disorder ([place])     MS    PAST SURGICAL HISTORY: Past Surgical History  Procedure Laterality Date  . Tubal ligation    . Cesarean section      x2  . Carpal tunnel release    . Incision and drainage abscess      left buttock    FAMILY HISTORY: Family History  Problem Relation Age of Onset  . Kidney failure Mother   . Diabetes Mother   . Colon cancer Neg Hx     SOCIAL HISTORY: Social History   Social History  . Marital Status: Single    Spouse Name: N/A  . Number of Children: 2  . Years of Education:  College    Occupational History  . HOUSEKEEPING    Social History Main Topics  . Smoking status: Former Smoker    Quit date: 01/23/2000  . Smokeless tobacco: Never Used  . Alcohol Use: 0.0 oz/week    0 Standard drinks or equivalent per week     Comment: occasional  . Drug Use: No  . Sexual Activity:    Partners: Male    Patent examiner Protection: None, Surgical   Other Topics Concern  . Not on file   Social History Narrative   Patient  lives at home with children.    Patient has 2 children.    Patient has a college degree.    Patient is single.    Patient is right handed.    Patient is currently employed.      PHYSICAL EXAM  Filed Vitals:   06/21/15 1324  BP: 116/74  Pulse: 80  Height: 5\' 4"  (1.626 m)  Weight: 134 lb 6.4 oz (60.963 kg)   Body mass index is 23.06 kg/(m^2). Generalized: Well developed, in no acute distress  Head: normocephalic and atraumatic,. Oropharynx benign  Neck: Supple, no carotid bruits  Cardiac: Regular rate rhythm, no murmur  Musculoskeletal: No deformity  Neurological examination  Mentation: Alert oriented to time, place, history taking. Follows all commands speech and language fluent  Cranial nerve II-XII: Visual acuity 20/30 right 20/20 left. Pupils were equal round reactive to light extraocular movements were full, visual field were full on confrontational test. Facial sensation and strength were normal. hearing was intact to finger rubbing bilaterally. Uvula tongue midline. head turning and shoulder shrug were normal and symmetric.Tongue protrusion into cheek strength was normal.  Motor: normal bulk and tone, full strength in the BUE, BLE, fine finger movements normal, no pronator drift. No focal weakness  Sensory: normal and symmetric to light touch, in the upper and lower extremities Coordination: finger-nose-finger, heel-to-shin bilaterally, no dysmetria  Reflexes: Brachioradialis 2/2, biceps 2/2, triceps 2/2, patellar 2/2, Achilles 2/2, plantar responses were flexor bilaterally.  Gait and Station: Rising up from seated position without assistance, normal stance, moderate stride, good arm swing, smooth turning, able to perform tiptoe, and heel walking without difficulty. Tandem gait is steady   DIAGNOSTIC DATA (LABS, IMAGING, TESTING) - I reviewed patient records, labs, notes, testing and imaging myself where available.  Lab Results  Component Value Date   WBC 4.1  12/13/2014   HGB 13.4 02/01/2014   HCT 38.7 12/13/2014   MCV 101* 12/13/2014   PLT 262 12/13/2014      Component Value Date/Time   NA 144 02/17/2015 1353   NA 142 11/09/2013 1414   K 4.0 02/17/2015 1353   CL 106 02/17/2015 1353   CO2 29 02/17/2015 1353   GLUCOSE 80 02/17/2015 1353   GLUCOSE 93 11/09/2013 1414   BUN 11 02/17/2015 1353   BUN 11 11/09/2013 1414   CREATININE 0.77 02/17/2015 1353   CREATININE 0.73 11/09/2013 1414   CALCIUM 8.9 02/17/2015 1353   PROT 5.9* 02/17/2015 1353   PROT 6.2 11/09/2013 1414   ALBUMIN 3.9 02/17/2015 1353   ALBUMIN 4.1 11/09/2013 1414   AST 27 02/17/2015 1353   ALT 30* 02/17/2015 1353   ALKPHOS 58 02/17/2015 1353   BILITOT 0.5 02/17/2015 1353   GFRNONAA >89 02/17/2015 1353   GFRNONAA 97 11/09/2013 1414   GFRAA >89 02/17/2015 1353   GFRAA 112 11/09/2013 1414   Lab Results  Component Value Date   CHOL 216* 02/17/2015  HDL 59 02/17/2015   LDLCALC 131* 02/17/2015   TRIG 131 02/17/2015   CHOLHDL 3.7 02/17/2015   Lab Results  Component Value Date   HGBA1C 5.7* 02/17/2015    ASSESSMENT AND PLAN 50 y.o. year old female has a past medical history of Multiple sclerosis ([place]); and Neuromuscular disorder ([place]). here to follow-up. She has not had an exacerbation of her MS symptoms since last seen. She is currently on Gilenya without side effects. Most recent CBC in  November  2016. CMP 02/17/2015 reviewed.   Will obtain CBC today to monitor for adverse effects of Gilenya Continue Gilenya at current dose, patient's pharmacy has changed and she will call us with an update Exercise by walking for overall health and well-being Stay well-hydrated Follow-up in 6 months Dennie Bible, [facility], [facility], [facility] [address], [place], [place] [address] [phone]

## 2015-06-22 ENCOUNTER — Telehealth: Payer: Self-pay | Admitting: Nurse Practitioner

## 2015-06-22 LAB — CBC WITH DIFFERENTIAL/PLATELET
Basophils Absolute: 0 10*3/uL (ref 0.0–0.2)
Basos: 0 %
EOS (ABSOLUTE): 0 10*3/uL (ref 0.0–0.4)
Eos: 0 %
Hematocrit: 37.7 % (ref 34.0–46.6)
Hemoglobin: 12.7 g/dL (ref 11.1–15.9)
Immature Grans (Abs): 0 10*3/uL (ref 0.0–0.1)
Immature Granulocytes: 0 %
Lymphocytes Absolute: 0.4 10*3/uL — ABNORMAL LOW (ref 0.7–3.1)
Lymphs: 14 %
MCH: 32.5 pg (ref 26.6–33.0)
MCHC: 33.7 g/dL (ref 31.5–35.7)
MCV: 96 fL (ref 79–97)
Monocytes Absolute: 0.6 10*3/uL (ref 0.1–0.9)
Monocytes: 21 %
Neutrophils Absolute: 1.7 10*3/uL (ref 1.4–7.0)
Neutrophils: 65 %
Platelets: 253 10*3/uL (ref 150–379)
RBC: 3.91 x10E6/uL (ref 3.77–5.28)
RDW: 14.3 % (ref 12.3–15.4)
WBC: 2.7 10*3/uL — ABNORMAL LOW (ref 3.4–10.8)

## 2015-06-22 NOTE — Telephone Encounter (Signed)
Called and spoke to patient and relayed labs were stable. Patient understood.

## 2015-06-22 NOTE — Progress Notes (Signed)
Quick Note: ° °Called and spoke to patient relayed labs were stable. Patient understood. °______ °

## 2015-06-22 NOTE — Telephone Encounter (Signed)
-----   Message from Dennie Bible, NP sent at 06/22/2015  7:51 AM EDT ----- Labs ok please call

## 2015-06-27 ENCOUNTER — Encounter: Payer: Self-pay | Admitting: Nurse Practitioner

## 2015-06-27 ENCOUNTER — Telehealth: Payer: Self-pay

## 2015-06-27 MED ORDER — FINGOLIMOD HCL 0.5 MG PO CAPS: 1.0000 | ORAL_CAPSULE | Freq: Every day | ORAL | Status: DC

## 2015-06-27 NOTE — Telephone Encounter (Signed)
[facility]

## 2015-08-01 ENCOUNTER — Telehealth: Payer: Self-pay

## 2015-08-01 NOTE — Telephone Encounter (Signed)
Error

## 2015-12-22 ENCOUNTER — Encounter: Payer: Self-pay | Admitting: Nurse Practitioner

## 2015-12-22 ENCOUNTER — Ambulatory Visit (INDEPENDENT_AMBULATORY_CARE_PROVIDER_SITE_OTHER): Payer: Medicaid Other | Admitting: Nurse Practitioner

## 2015-12-22 VITALS — BP 134/77 | HR 70 | Ht 64.0 in | Wt 134.6 lb

## 2015-12-22 DIAGNOSIS — G35 Multiple sclerosis: Secondary | ICD-10-CM

## 2015-12-22 DIAGNOSIS — Z5181 Encounter for therapeutic drug level monitoring: Secondary | ICD-10-CM | POA: Diagnosis not present

## 2015-12-22 MED ORDER — FINGOLIMOD HCL 0.5 MG PO CAPS: 1.0000 | ORAL_CAPSULE | Freq: Every day | ORAL | 6 refills | Status: DC

## 2015-12-22 NOTE — Patient Instructions (Signed)
Will obtain CBC today to monitor for adverse effects of Gilenya Continue Gilenya at current dose, will refill Exercise by walking for overall health and well-being Stay well-hydrated Follow-up in 6 months

## 2015-12-22 NOTE — Progress Notes (Signed)
Minutes  [facility]  PATIENT: Kelly Grant DOB: 1965/02/29   REASON FOR VISIT: Follow-up for multiple sclerosis, relapsing remitting HISTORY FROM: Patient    HISTORY OF PRESENT ILLNESS: Kelly Grant,50 year old female returns for followup. She was last seen in this office in 06/21/15  She has a history of multiple sclerosis relapsing remitting. Initial symptoms were right optic neuritis in the 1980s then in May 2006 a left optic neuritis. MRI of the brain at that time showed typical MS lesions. She is currently on Gilenya tolerating the medication without difficulty. Today she denies spasms, focal weakness, sensory changes, visual changes, speech or swallowing problems, no problems with bowel or bladder function. No exacerbation since last seen, and no new neurologic complaints. Most recent MRI of the brain performed 11/27/2012 was abnormal showing extensive bilateral subcortical and ventricular white matter hyperintensities compatible with chronic demyelinating plaques. No enhancing lesions are seen, presence of atrophy of the corpus colossal and multiple T1 black holes indicate chronic disease. Compared to the MRI July 2006 white matter disease burden is slightly progressed while atrophy and T1 black holes are much more advanced. She denies any recent falls. She is looking for work . She continues to drive without difficulty. She denies any memory issues. She returns for reevaluation.   REVIEW OF SYSTEMS: Full 14 system review of systems performed and notable only for those listed, all others are neg:  Constitutional: neg  Cardiovascular: neg Ear/Nose/Throat: neg  Skin: neg Eyes: neg Respiratory: neg Gastroitestinal: neg  Hematology/Lymphatic: neg  Endocrine: neg Musculoskeletal:neg Allergy/Immunology: neg Neurological: neg Psychiatric: neg Sleep : neg   ALLERGIES: Allergies  Allergen Reactions  . Macrobid [Nitrofurantoin Macrocrystal] Hives    HOME  MEDICATIONS: Outpatient Medications Prior to Visit  Medication Sig Dispense Refill  . calcium-vitamin D (OSCAL WITH D) 500-200 MG-UNIT tablet Take 2 tablets by mouth.     . carboxymethylcellulose (REFRESH PLUS) 0.5 % SOLN 1 drop daily as needed.    . CycloSPORINE (RESTASIS OP) Apply to eye.    . diclofenac sodium (VOLTAREN) 1 % GEL Apply topically as needed.    . Fingolimod HCl (GILENYA) 0.5 MG CAPS Take 1 capsule (0.5 mg total) by mouth daily. 30 capsule 6  . Multiple Vitamin (MULTIVITAMIN) tablet Take 1 tablet by mouth daily. 30 tablet 3  . RESTASIS 0.05 % ophthalmic emulsion   0  . valACYclovir (VALTREX) 500 MG tablet Take 1 tablet (500 mg total) by mouth daily. 90 tablet 2  . Wheat Dextrin (BENEFIBER DRINK MIX PO) Take by mouth. 2 tsp TID     No facility-administered medications prior to visit.     PAST MEDICAL HISTORY: Past Medical History:  Diagnosis Date  . Abnormal Pap smear    ASC-H  . Chlamydia   . History of chicken pox   . HSV infection   . Multiple sclerosis ([place])   . Neuromuscular disorder ([place])    MS  . Yeast infection     PAST SURGICAL HISTORY: Past Surgical History:  Procedure Laterality Date  . CARPAL TUNNEL RELEASE    . CESAREAN SECTION     x2  . INCISION AND DRAINAGE ABSCESS     left buttock  . TUBAL LIGATION      FAMILY HISTORY: Family History  Problem Relation Age of Onset  . Kidney failure Mother   . Diabetes Mother   . Colon cancer Neg Hx     SOCIAL HISTORY: Social History   Social History  . Marital status:  Single    Spouse name: N/A  . Number of children: 2  . 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 Smoking status: Former Smoker    Quit date: 01/23/2000  . Smokeless tobacco: Never Used  . Alcohol use 0.0 oz/week     Comment: occasional  . Drug use: No  . Sexual activity: Yes    Partners: Male    Birth control/ protection: None, Surgical   Other  Topics Concern  . Not on file   Social History Narrative   Patient lives at home with children.    Patient has 2 children.    Patient has a college degree.    Patient is single.    Patient is right handed.    Patient is currently employed.      PHYSICAL EXAM  Vitals:   12/22/15 1011  BP: 134/77  Pulse: 70  Weight: 134 lb 9.6 oz (61.1 kg)  Height: 5\' 4"  (1.626 m)   Body mass index is 23.1 kg/m. Generalized: Well developed, in no acute distress  Head: normocephalic and atraumatic,. Oropharynx benign  Neck: Supple, no carotid bruits  Cardiac: Regular rate rhythm, no murmur  Musculoskeletal: No deformity  Neurological examination  Mentation: Alert oriented to time, place, history taking. Follows all commands speech and language fluent  Cranial nerve II-XII: Visual acuity 20/30 bil. Pupils were equal round reactive to light extraocular movements were full, visual field were full on confrontational test. Facial sensation and strength were normal. hearing was intact to finger rubbing bilaterally. Uvula tongue midline. head turning and shoulder shrug were normal and symmetric.Tongue protrusion into cheek strength was normal.  Motor: normal bulk and tone, full strength in the BUE, BLE, fine finger movements normal, no pronator drift. No focal weakness  Sensory: normal and symmetric to light touch, in the upper and lower extremities Coordination: finger-nose-finger, heel-to-shin bilaterally, no dysmetria  Reflexes: Brachioradialis 2/2, biceps 2/2, triceps 2/2, patellar 2/2, Achilles 2/2, plantar responses were flexor bilaterally.  Gait and Station: Rising up from seated position without assistance, normal stance, moderate stride, good arm swing, smooth turning, able to perform tiptoe, and heel walking without difficulty. Tandem gait is steady. No assistive device   DIAGNOSTIC DATA (LABS, IMAGING, TESTING) - I reviewed patient records, labs, notes, testing and imaging myself  where available.  Lab Results  Component Value Date   WBC 2.7 (L) 06/21/2015   HGB 13.4 02/01/2014   HCT 37.7 06/21/2015   MCV 96 06/21/2015   PLT 253 06/21/2015      Component Value Date/Time   NA 144 02/17/2015 1353   NA 142 11/09/2013 1414   K 4.0 02/17/2015 1353   CL 106 02/17/2015 1353   CO2 29 02/17/2015 1353   GLUCOSE 80 02/17/2015 1353   BUN 11 02/17/2015 1353   BUN 11 11/09/2013 1414   CREATININE 0.77 02/17/2015 1353   CALCIUM 8.9 02/17/2015 1353   PROT 5.9 (L) 02/17/2015 1353   PROT 6.2 11/09/2013 1414   ALBUMIN 3.9 02/17/2015 1353   ALBUMIN 4.1 11/09/2013 1414   AST 27 02/17/2015 1353   ALT 30 (H) 02/17/2015 1353   ALKPHOS 58 02/17/2015 1353   BILITOT 0.5 02/17/2015 1353   GFRNONAA >89 02/17/2015 1353   GFRAA >89 02/17/2015 1353   Lab Results  Component Value Date   CHOL 216 (H) 02/17/2015   HDL 59 02/17/2015   LDLCALC 131 (H) 02/17/2015  TRIG 131 02/17/2015   CHOLHDL 3.7 02/17/2015   Lab Results  Component Value Date   HGBA1C 5.7 (H) 02/17/2015    ASSESSMENT AND PLAN 50 y.o. year old female has a past medical history of Multiple sclerosis ([place]); and Neuromuscular disorder ([place]). here to follow-up. She has not had an exacerbation of her MS symptoms since last seen. She is currently on Gilenya without side effects.   PLAN: Will obtain CBC today to monitor for adverse effects of Gilenya Continue Gilenya at current dose, will refill Exercise by walking for overall health and well-being Stay well-hydrated Will obtain MRI of the brain at next visit Follow-up in 6 months Dennie Bible, [facility], [facility], [facility] [address] [place], [place] [address] [phone]

## 2015-12-22 NOTE — Progress Notes (Signed)
I have reviewed and agreed above plan. 

## 2015-12-23 ENCOUNTER — Telehealth: Payer: Self-pay | Admitting: *Deleted

## 2015-12-23 LAB — CBC WITH DIFFERENTIAL/PLATELET
Basophils Absolute: 0 10*3/uL (ref 0.0–0.2)
Basos: 0 %
EOS (ABSOLUTE): 0 10*3/uL (ref 0.0–0.4)
Eos: 1 %
Hematocrit: 38.3 % (ref 34.0–46.6)
Hemoglobin: 13.1 g/dL (ref 11.1–15.9)
Immature Grans (Abs): 0 10*3/uL (ref 0.0–0.1)
Immature Granulocytes: 0 %
Lymphocytes Absolute: 0.5 10*3/uL — ABNORMAL LOW (ref 0.7–3.1)
Lymphs: 20 %
MCH: 33.3 pg — ABNORMAL HIGH (ref 26.6–33.0)
MCHC: 34.2 g/dL (ref 31.5–35.7)
MCV: 98 fL — ABNORMAL HIGH (ref 79–97)
Monocytes Absolute: 0.5 10*3/uL (ref 0.1–0.9)
Monocytes: 20 %
Neutrophils Absolute: 1.4 10*3/uL (ref 1.4–7.0)
Neutrophils: 59 %
Platelets: 264 10*3/uL (ref 150–379)
RBC: 3.93 x10E6/uL (ref 3.77–5.28)
RDW: 14 % (ref 12.3–15.4)
WBC: 2.4 10*3/uL — CL (ref 3.4–10.8)

## 2015-12-23 NOTE — Telephone Encounter (Signed)
-----   Message from Dennie Bible, NP sent at 12/23/2015  7:57 AM EST ----- Labs stable please call the patient

## 2015-12-23 NOTE — Telephone Encounter (Signed)
LMVM for pt to call back for lab results   (if she calls back you may let her know that they are stable).

## 2015-12-26 NOTE — Telephone Encounter (Signed)
Advised patient of stable lab results as in previous message.

## 2016-01-27 ENCOUNTER — Telehealth: Payer: Self-pay

## 2016-01-27 DIAGNOSIS — B009 Herpesviral infection, unspecified: Secondary | ICD-10-CM

## 2016-01-27 MED ORDER — VALACYCLOVIR HCL 500 MG PO TABS: 500.0000 mg | ORAL_TABLET | Freq: Every day | ORAL | 0 refills | Status: DC

## 2016-01-27 NOTE — Telephone Encounter (Signed)
Refill sent into pharmacy. Thanks!  

## 2016-01-30 ENCOUNTER — Other Ambulatory Visit: Payer: Self-pay | Admitting: Family Medicine

## 2016-01-30 DIAGNOSIS — B009 Herpesviral infection, unspecified: Secondary | ICD-10-CM

## 2016-02-17 ENCOUNTER — Ambulatory Visit (INDEPENDENT_AMBULATORY_CARE_PROVIDER_SITE_OTHER): Payer: Medicaid Other | Admitting: Family Medicine

## 2016-02-17 ENCOUNTER — Encounter: Payer: Self-pay | Admitting: Family Medicine

## 2016-02-17 VITALS — BP 137/77 | HR 79 | Temp 98.4°F | Resp 16 | Ht 64.0 in | Wt 136.0 lb

## 2016-02-17 DIAGNOSIS — Z0000 Encounter for general adult medical examination without abnormal findings: Secondary | ICD-10-CM | POA: Diagnosis not present

## 2016-02-17 DIAGNOSIS — Z114 Encounter for screening for human immunodeficiency virus [HIV]: Secondary | ICD-10-CM | POA: Diagnosis not present

## 2016-02-17 DIAGNOSIS — D229 Melanocytic nevi, unspecified: Secondary | ICD-10-CM

## 2016-02-17 DIAGNOSIS — R7303 Prediabetes: Secondary | ICD-10-CM

## 2016-02-17 LAB — POCT GLYCOSYLATED HEMOGLOBIN (HGB A1C): Hemoglobin A1C: 5.2

## 2016-02-17 NOTE — Progress Notes (Signed)
Subjective:    Patient ID: Kelly Grant, female    DOB: Mar 02, 1965, 51 y.o.   MRN: [id number]  HPI Annual Exam-Premenopausal:   Ms. Kelly Grant, a 51 year old female with a history of musculosclerosis and pre diabetes that presents for a complete physical exam. Ms. Kelly Grant says that she feels well and is without complaint. She maintains that she follows a low fat, low sodium diet and exercises periodically. The patient is followed by [facility] for multiple sclerosis and states that she is doing well on current medication regimen. She is sexually active without barrier protection. She wears seat belts. Her last pap smear was 2 years ago with Dr. Jodi Mourning. She says that she will schedule a follow up with gynecology. She is up to date with vaccinations. She has never been tattooed. She denies current domestic violence.  Past Medical History:  Diagnosis Date  . Abnormal Pap smear    ASC-H  . Chlamydia   . History of chicken pox   . HSV infection   . Multiple sclerosis ([place])   . Neuromuscular disorder ([place])    MS  . Yeast infection    Social History   Social History  . Marital status: Single    Spouse name: N/A  . Number of children: 2  . 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 Smoking status: Former Smoker    Quit date: 01/23/2000  . Smokeless tobacco: Never Used  . Alcohol use 0.0 oz/week     Comment: occasional  . Drug use: No  . Sexual activity: Yes    Partners: Male    Birth control/ protection: None, Surgical   Other Topics Concern  . Not on file   Social History Narrative   Patient lives at home with children.    Patient has 2 children.    Patient has a college degree.    Patient is single.    Patient is right handed.    Patient is currently employed.    Allergies  Allergen Reactions  . Macrobid [Nitrofurantoin Macrocrystal] Hives   Immunization History  Administered Date(s)  Administered  . Influenza,inj,Quad PF,36+ Mos 11/05/2013, 10/08/2014  . Tdap 03/04/2012, 08/05/2013   Current Outpatient Prescriptions on File Prior to Visit  Medication Sig Dispense Refill  . calcium-vitamin D (OSCAL WITH D) 500-200 MG-UNIT tablet Take 2 tablets by mouth.     . carboxymethylcellulose (REFRESH PLUS) 0.5 % SOLN 1 drop daily as needed.    . CycloSPORINE (RESTASIS OP) Apply to eye.    . diclofenac sodium (VOLTAREN) 1 % GEL Apply topically as needed.    . Fingolimod HCl (GILENYA) 0.5 MG CAPS Take 1 capsule (0.5 mg total) by mouth daily. 30 capsule 6  . Multiple Vitamin (MULTIVITAMIN) tablet Take 1 tablet by mouth daily. 30 tablet 3  . Wheat Dextrin (BENEFIBER DRINK MIX PO) Take by mouth. 2 tsp TID    . valACYclovir (VALTREX) 500 MG tablet Take 1 tablet (500 mg total) by mouth daily. 90 tablet 0   No current facility-administered medications on file prior to visit.    Depression screen [facility] 2/9 02/17/2016 08/09/2014 07/29/2014 02/08/2014 08/05/2013  Decreased Interest 0 0 0 0 -  Down, Depressed, Hopeless 0 0 0 0 0  PHQ - 2 Score 0 0 0 0 0     Review of Systems  Constitutional: Negative.  Negative for fatigue and fever.  HENT: Negative.   Eyes: Negative.   Respiratory: Negative.  Negative for shortness of breath.   Cardiovascular: Negative.  Negative for chest pain, palpitations and leg swelling.  Endocrine: Negative.  Negative for polydipsia, polyphagia and polyuria.  Genitourinary: Negative.   Musculoskeletal: Negative.   Skin: Negative.   Allergic/Immunologic: Negative.  Negative for food allergies and immunocompromised state.  Neurological: Negative.  Negative for dizziness, facial asymmetry, weakness, light-headedness, numbness and headaches.  Hematological: Negative.   Psychiatric/Behavioral: Negative.        Objective:   Physical Exam  Constitutional: She is oriented to person, place, and time. She appears well-developed and well-nourished.  HENT:  Head:  Normocephalic and atraumatic.  Right Ear: External ear normal.  Left Ear: External ear normal.  Nose: Nose normal.  Mouth/Throat: Oropharynx is clear and moist.  Eyes: Conjunctivae and EOM are normal. Pupils are equal, round, and reactive to light.  Neck: Normal range of motion. Neck supple.  Cardiovascular: Normal rate, regular rhythm and intact distal pulses.   Pulmonary/Chest: Effort normal and breath sounds normal.  Abdominal: Soft. Bowel sounds are normal.  Musculoskeletal: Normal range of motion.  Neurological: She is alert and oriented to person, place, and time. She has normal reflexes.  Reflex Scores:      Tricep reflexes are 2+ on the right side and 2+ on the left side.      Bicep reflexes are 2+ on the right side and 2+ on the left side.      Brachioradialis reflexes are 2+ on the right side and 2+ on the left side.      Patellar reflexes are 2+ on the right side and 2+ on the left side.      Achilles reflexes are 2+ on the right side and 2+ on the left side. Skin: Skin is warm and dry.  Mole to left lateral thigh, rough to palpation, irregular borders, dome shaped, 0.5 cm  Psychiatric: She has a normal mood and affect. Her behavior is normal. Judgment and thought content normal.      BP 137/77 (BP Location: Right Arm, Patient Position: Sitting, Cuff Size: Normal)   Pulse 79   Temp 98.4 F (36.9 C) (Oral)   Resp 16   Ht 5\' 4"  (1.626 m)   Wt 136 lb (61.7 kg)   SpO2 99%   BMI 23.34 kg/m  Assessment & Plan:  1. Annual physical exam Recommend a lowfat, low carbohydrate diet divided over 5-6 small meals, increase water intake to 6-8 glasses, and 150 minutes per week of cardiovascular exercise.  Recommend a follow up with Dr. Jodi Mourning for pap smear Reviewed previous labs and mammogram A high fiber diet with plenty of fluids (up to 8 glasses of water daily) is suggested to relieve occasional constipation.   Recommend sunscreen SPF 30 during extended periods of sun  exposure Immunization History  Administered Date(s) Administered  . Influenza,inj,Quad PF,36+ Mos 11/05/2013, 10/08/2014  . Tdap 03/04/2012, 08/05/2013   - HgB A1c - Lipid Panel - HIV antibody (with reflex)  2. Screening for HIV (human immunodeficiency virus) - HIV antibody (with reflex)  3. Atypical mole - Ambulatory referral to Dermatology   RTC: 1 year for annual physical examination   Dorena Dew, FNP

## 2016-02-18 LAB — LIPID PANEL
Cholesterol: 227 mg/dL — ABNORMAL HIGH (ref <200)
HDL: 78 mg/dL (ref >50)
LDL Cholesterol: 131 mg/dL — ABNORMAL HIGH (ref <100)
Total CHOL/HDL Ratio: 2.9 Ratio (ref <5.0)
Triglycerides: 89 mg/dL (ref <150)
VLDL: 18 mg/dL (ref <30)

## 2016-02-18 LAB — HIV ANTIBODY (ROUTINE TESTING W REFLEX): HIV 1&2 Ab, 4th Generation: NONREACTIVE

## 2016-02-21 ENCOUNTER — Telehealth: Payer: Self-pay

## 2016-02-21 NOTE — Telephone Encounter (Signed)
Called, no answer on either line. Left message on home phone. Thanks!

## 2016-02-21 NOTE — Telephone Encounter (Signed)
-----   Message from Dorena Dew, [place] sent at 02/20/2016  4:56 PM EST ----- Regarding: lab results Please inform patient that cholesterol is mildly elevated. Recommend OTC Fish Oil capsules every evening with dinner. Recommend a lowfat, low carbohydrate diet divided over 5-6 small meals, increase water intake to 6-8 glasses, and 150 minutes per week of cardiovascular exercise. Will re-check cholesterol in 6 months.   Thanks ----- Message ----- From: Adelina Mings, LPN Sent: [id number]   1:46 PM To: Dorena Dew, FNP

## 2016-02-21 NOTE — Telephone Encounter (Signed)
Patient called back, advised of elevated cholesterol. Advised to take 1 fish oil capsule otc with dinner daily. Advised to eat low fat/low carb diet over 5 to 6 small meals daily. Advised to drink 6 to 8 glasses of water daily and exercise 150 minutes weekly of cardio. Encouraged to keep 6 month follow up and we will follow up appointment. Thanks!~

## 2016-06-20 ENCOUNTER — Ambulatory Visit (INDEPENDENT_AMBULATORY_CARE_PROVIDER_SITE_OTHER): Payer: Medicaid Other | Admitting: Nurse Practitioner

## 2016-06-20 ENCOUNTER — Encounter: Payer: Self-pay | Admitting: Nurse Practitioner

## 2016-06-20 VITALS — BP 119/75 | HR 77 | Ht 64.0 in | Wt 135.0 lb

## 2016-06-20 DIAGNOSIS — Z5181 Encounter for therapeutic drug level monitoring: Secondary | ICD-10-CM | POA: Diagnosis not present

## 2016-06-20 DIAGNOSIS — G35 Multiple sclerosis: Secondary | ICD-10-CM

## 2016-06-20 MED ORDER — FINGOLIMOD HCL 0.5 MG PO CAPS: 1.0000 | ORAL_CAPSULE | Freq: Every day | ORAL | 1 refills | Status: DC

## 2016-06-20 NOTE — Patient Instructions (Signed)
Will obtain CBC, CMP  today to monitor for adverse effects of Gilenya Continue Gilenya at current dose, will refill MRI of the brain with and without to follow progression of multiple sclerosis last in 2014 Exercise by walking for overall health and well-being Stay well-hydrated Follow-up in 6 months, next with Dr. Krista Blue

## 2016-06-20 NOTE — Progress Notes (Signed)
Minutes  [facility]  PATIENT: Kelly Grant DOB: [birth year]   REASON FOR VISIT: Follow-up for multiple sclerosis, relapsing remitting HISTORY FROM: Patient    HISTORY OF PRESENT ILLNESS:UPDATE 05/30/2018CM Kelly Grant, 51 year old female returns for follow-up with a history of relapsing remitting multiple sclerosis. Her initial symptoms were optic neuritis in the 80s. Last MRI of the brain performed on 11/27/2012 was abnormal showing extensive bilateral subcortical and ventricular white matter hyperintensities compatible with chronic demyelinating plaques. No enhancing lesions are seen, presence of atrophy of the corpus colossal and multiple T1 black holes indicate chronic disease. She is currently on Gilenya without side effects and no exacerbation of her symptoms. She denies any visual changes each or swallowing problems new  weakness bowel or bladder issues, or muscle spasms. She has not had any falls. She was recently placed on fish oil  for elevated cholesterol . She returns for reevaluation   12/22/15 Kelly Kelly Grant,52 year old female returns for followup. She was last seen in this office in 06/21/15  She has a history of multiple sclerosis relapsing remitting. Initial symptoms were right optic neuritis in the 1980s then in May 2006 a left optic neuritis. MRI of the brain at that time showed typical Kelly lesions. She is currently on Gilenya tolerating the medication without difficulty. Today she denies spasms, focal weakness, sensory changes, visual changes, speech or swallowing problems, no problems with bowel or bladder function. No exacerbation since last seen, and no new neurologic complaints. Most recent MRI of the brain performed 11/27/2012 was abnormal showing extensive bilateral subcortical and ventricular white matter hyperintensities compatible with chronic demyelinating plaques. No enhancing lesions are seen, presence of atrophy of the corpus colossal and multiple T1  black holes indicate chronic disease. Compared to the MRI July 2006 white matter disease burden is slightly progressed while atrophy and T1 black holes are much more advanced. She denies any recent falls. She is looking for work . She continues to drive without difficulty. She denies any memory issues. She returns for reevaluation.   REVIEW OF SYSTEMS: Full 14 system review of systems performed and notable only for those listed, all others are neg:  Constitutional: neg  Cardiovascular: neg Ear/Nose/Throat: neg  Skin: neg Eyes: neg Respiratory: neg Gastroitestinal: neg  Hematology/Lymphatic: neg  Endocrine: neg Musculoskeletal:neg Allergy/Immunology: neg Neurological: neg Psychiatric: neg Sleep : neg   ALLERGIES: Allergies  Allergen Reactions  . Macrobid [Nitrofurantoin Macrocrystal] Hives    HOME MEDICATIONS: Outpatient Medications Prior to Visit  Medication Sig Dispense Refill  . calcium-vitamin D (OSCAL WITH D) 500-200 MG-UNIT tablet Take 2 tablets by mouth.     . carboxymethylcellulose (REFRESH PLUS) 0.5 % SOLN 1 drop daily as needed.    . CycloSPORINE (RESTASIS OP) Apply to eye.    . diclofenac sodium (VOLTAREN) 1 % GEL Apply topically as needed.    . Fingolimod HCl (GILENYA) 0.5 MG CAPS Take 1 capsule (0.5 mg total) by mouth daily. 30 capsule 6  . Multiple Vitamin (MULTIVITAMIN) tablet Take 1 tablet by mouth daily. 30 tablet 3  . valACYclovir (VALTREX) 500 MG tablet Take 1 tablet (500 mg total) by mouth daily. 90 tablet 0  . Wheat Dextrin (BENEFIBER DRINK MIX PO) Take by mouth. 2 tsp TID     No facility-administered medications prior to visit.     PAST MEDICAL HISTORY: Past Medical History:  Diagnosis Date  . Abnormal Pap smear    ASC-H  . Chlamydia   . History of chicken pox   .  HSV infection   . Multiple sclerosis ([place])   . Neuromuscular disorder ([place])    Kelly  . Yeast infection     PAST SURGICAL HISTORY: Past Surgical History:  Procedure Laterality Date    . CARPAL TUNNEL RELEASE    . CESAREAN SECTION     x2  . INCISION AND DRAINAGE ABSCESS     left buttock  . TUBAL LIGATION      FAMILY HISTORY: Family History  Problem Relation Age of Onset  . Kidney failure Mother   . Diabetes Mother   . Colon cancer Neg Hx     SOCIAL HISTORY: Social History   Social History  . Marital status: Single    Spouse name: N/A  . Number of children: 2  . 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 Smoking status: Former Smoker    Quit date: 01/23/2000  . Smokeless tobacco: Never Used  . Alcohol use 0.0 oz/week     Comment: occasional  . Drug use: No  . Sexual activity: Yes    Partners: Male    Birth control/ protection: None, Surgical   Other Topics Concern  . Not on file   Social History Narrative   Patient lives at home with children.    Patient has 2 children.    Patient has a college degree.    Patient is single.    Patient is right handed.    Patient is currently employed.      PHYSICAL EXAM  Vitals:   06/20/16 1015  BP: 119/75  Pulse: 77  Weight: 135 lb (61.2 kg)  Height: 5\' 4"  (1.626 m)   Body mass index is 23.17 kg/m. Generalized: Well developed, in no acute distress ,Well-groomed Head: normocephalic and atraumatic,. Oropharynx benign  Neck: Supple,  Cardiac: Regular rate rhythm, no murmur  Musculoskeletal: No deformity  Neurological examination  Mentation: Alert oriented to time, place, history taking. Follows all commands speech and language fluent  Cranial nerve II-XII: Visual acuity 20/30 right, 20/20 left. Pupils were equal round reactive to light extraocular movements were full, visual field were full on confrontational test. Facial sensation and strength were normal. hearing was intact to finger rubbing bilaterally. Uvula tongue midline. head turning and shoulder shrug were normal and symmetric.Tongue protrusion into cheek strength  was normal.  Motor: normal bulk and tone, full strength in the BUE, BLE, fine finger movements normal, no pronator drift.   Sensory: normal and symmetric to light touch, in the upper and lower extremities Coordination: finger-nose-finger, heel-to-shin bilaterally, no dysmetria  Reflexes: Symmetric upper and lower plantar responses were flexor bilaterally.  Gait and Station: Rising up from seated position without assistance, normal stance, moderate stride, good arm swing, smooth turning, able to perform tiptoe, and heel walking without difficulty. Tandem gait is mildly unsteady. No assistive device   DIAGNOSTIC DATA (LABS, IMAGING, TESTING) - I reviewed patient records, labs, notes, testing and imaging myself where available.  Lab Results  Component Value Date   WBC 2.4 (LL) 12/22/2015   HGB 13.4 02/01/2014   HCT 38.3 12/22/2015   MCV 98 (H) 12/22/2015   PLT 264 12/22/2015      Component Value Date/Time   NA 144 02/17/2015 1353   NA 142 11/09/2013 1414   K 4.0 02/17/2015 1353   CL 106 02/17/2015 1353   CO2 29 02/17/2015 1353   GLUCOSE 80 02/17/2015 1353   BUN 11  02/17/2015 1353   BUN 11 11/09/2013 1414   CREATININE 0.77 02/17/2015 1353   CALCIUM 8.9 02/17/2015 1353   PROT 5.9 (L) 02/17/2015 1353   PROT 6.2 11/09/2013 1414   ALBUMIN 3.9 02/17/2015 1353   ALBUMIN 4.1 11/09/2013 1414   AST 27 02/17/2015 1353   ALT 30 (H) 02/17/2015 1353   ALKPHOS 58 02/17/2015 1353   BILITOT 0.5 02/17/2015 1353   GFRNONAA >89 02/17/2015 1353   GFRAA >89 02/17/2015 1353   Lab Results  Component Value Date   CHOL 227 (H) 02/17/2016   HDL 78 02/17/2016   LDLCALC 131 (H) 02/17/2016   TRIG 89 02/17/2016   CHOLHDL 2.9 02/17/2016   Lab Results  Component Value Date   HGBA1C 5.2 02/17/2016    ASSESSMENT AND PLAN 51 y.o. year old female has a past medical history of Multiple sclerosis ([place]); and Neuromuscular disorder ([place]). here to follow-up. She has not had an exacerbation of her Kelly  symptoms since last seen. She is currently on Gilenya without side effects.   PLAN: Will obtain CBC, CMP  today to monitor for adverse effects of Gilenya Continue Gilenya at current dose, will refill MRI of the brain with and without to follow progression of multiple sclerosis last in 2014 Exercise by walking for overall health and well-being Stay well-hydrated Follow-up in 6 months, next with Dr. Krista Blue I spent 25 minutes in total face to face time with the patient more than 50% of which was spent counseling and coordination of care, reviewing test results reviewing medications and discussing and reviewing the diagnosis of relapsing remitting multiple sclerosis and importance of following MRI to make certain she is on the best treatment Dennie Bible, [facility], [facility], [facility] [address], [place], [place] [address] [phone]

## 2016-06-21 LAB — COMPREHENSIVE METABOLIC PANEL
ALT: 20 IU/L (ref 0–32)
AST: 23 IU/L (ref 0–40)
Albumin/Globulin Ratio: 1.8 (ref 1.2–2.2)
Albumin: 4.1 g/dL (ref 3.5–5.5)
Alkaline Phosphatase: 63 IU/L (ref 39–117)
BUN/Creatinine Ratio: 14 (ref 9–23)
BUN: 11 mg/dL (ref 6–24)
Bilirubin Total: 0.4 mg/dL (ref 0.0–1.2)
CO2: 26 mmol/L (ref 18–29)
Calcium: 9.6 mg/dL (ref 8.7–10.2)
Chloride: 106 mmol/L (ref 96–106)
Creatinine, Ser: 0.76 mg/dL (ref 0.57–1.00)
GFR calc Af Amer: 104 mL/min/{1.73_m2} (ref >59)
GFR calc non Af Amer: 90 mL/min/{1.73_m2} (ref >59)
Globulin, Total: 2.3 g/dL (ref 1.5–4.5)
Glucose: 94 mg/dL (ref 65–99)
Potassium: 4.8 mmol/L (ref 3.5–5.2)
Sodium: 146 mmol/L — ABNORMAL HIGH (ref 134–144)
Total Protein: 6.4 g/dL (ref 6.0–8.5)

## 2016-06-21 LAB — CBC WITH DIFFERENTIAL/PLATELET
Basophils Absolute: 0 10*3/uL (ref 0.0–0.2)
Basos: 0 %
EOS (ABSOLUTE): 0 10*3/uL (ref 0.0–0.4)
Eos: 1 %
Hematocrit: 39.5 % (ref 34.0–46.6)
Hemoglobin: 12.8 g/dL (ref 11.1–15.9)
Immature Grans (Abs): 0 10*3/uL (ref 0.0–0.1)
Immature Granulocytes: 0 %
Lymphocytes Absolute: 0.5 10*3/uL — ABNORMAL LOW (ref 0.7–3.1)
Lymphs: 15 %
MCH: 31.9 pg (ref 26.6–33.0)
MCHC: 32.4 g/dL (ref 31.5–35.7)
MCV: 99 fL — ABNORMAL HIGH (ref 79–97)
Monocytes Absolute: 0.5 10*3/uL (ref 0.1–0.9)
Monocytes: 14 %
Neutrophils Absolute: 2.3 10*3/uL (ref 1.4–7.0)
Neutrophils: 70 %
Platelets: 264 10*3/uL (ref 150–379)
RBC: 4.01 x10E6/uL (ref 3.77–5.28)
RDW: 14 % (ref 12.3–15.4)
WBC: 3.4 10*3/uL (ref 3.4–10.8)

## 2016-06-22 ENCOUNTER — Telehealth: Payer: Self-pay | Admitting: *Deleted

## 2016-06-22 NOTE — Telephone Encounter (Signed)
-----   Message from Dennie Bible, NP sent at 06/22/2016  8:20 AM EDT ----- Labs stable please call

## 2016-06-26 NOTE — Telephone Encounter (Signed)
I spoke to pt and relayed that her lab results were stable per CM/NP.  She verbalized understanding.

## 2016-06-27 NOTE — Progress Notes (Signed)
I have reviewed and agreed above plan. 

## 2016-07-01 ENCOUNTER — Ambulatory Visit
Admission: RE | Admit: 2016-07-01 | Discharge: 2016-07-01 | Disposition: A | Discharge status: Home / Self Care | Payer: Medicaid Other | Source: Ambulatory Visit | Attending: Nurse Practitioner | Admitting: Nurse Practitioner

## 2016-07-01 DIAGNOSIS — G35 Multiple sclerosis: Secondary | ICD-10-CM | POA: Diagnosis not present

## 2016-07-01 MED ORDER — GADOBENATE DIMEGLUMINE 529 MG/ML IV SOLN
13.0000 mL | Freq: Once | INTRAVENOUS | Status: AC | PRN
  Administered 2016-07-01: 13 mL via INTRAVENOUS

## 2016-07-03 ENCOUNTER — Telehealth: Payer: Self-pay | Admitting: *Deleted

## 2016-07-03 NOTE — Telephone Encounter (Signed)
Called and spoke with patient about MRI results per CM,NP results. She verbalized understanding.

## 2016-07-03 NOTE — Telephone Encounter (Signed)
-----   Message from Dennie Bible, NP sent at 07/02/2016 11:54 AM EDT ----- MRI of the brain is stable when compared to MRI dated November 2014 there has been no change. Please call the patient

## 2016-10-25 DIAGNOSIS — Z1231 Encounter for screening mammogram for malignant neoplasm of breast: Secondary | ICD-10-CM | POA: Diagnosis not present

## 2016-10-30 ENCOUNTER — Telehealth: Payer: Self-pay

## 2016-10-30 DIAGNOSIS — B009 Herpesviral infection, unspecified: Secondary | ICD-10-CM

## 2016-10-30 MED ORDER — VALACYCLOVIR HCL 500 MG PO TABS: 500.0000 mg | ORAL_TABLET | Freq: Every day | ORAL | 0 refills | Status: DC

## 2016-10-30 NOTE — Telephone Encounter (Signed)
Refill sent into pharmacy. Thanks!  

## 2016-12-17 ENCOUNTER — Other Ambulatory Visit: Payer: Self-pay | Admitting: Nurse Practitioner

## 2016-12-19 ENCOUNTER — Encounter: Payer: Self-pay | Admitting: Neurology

## 2016-12-19 ENCOUNTER — Ambulatory Visit: Payer: Medicaid Other | Admitting: Neurology

## 2016-12-19 VITALS — BP 115/72 | HR 65 | Ht 64.0 in | Wt 146.0 lb

## 2016-12-19 DIAGNOSIS — G35 Multiple sclerosis: Secondary | ICD-10-CM

## 2016-12-19 NOTE — Progress Notes (Signed)
Minutes  [facility]  PATIENT: Kelly Grant DOB: 17-Mar-1965  HISTORY OF PRESENT ILLNESS: She has a history of multiple sclerosis relapsing remitting.   Initial symptoms were right optic neuritis in the 1980s then in May 2006 a left optic neuritis. MRI of the brain at that time showed typical MS lesions.   She is currently on Gilenya tolerating the medication without difficulty. Today she denies spasms, focal weakness, sensory changes, visual changes, speech or swallowing problems, no problems with bowel or bladder function. No exacerbation since last seen, and no new neurologic complaints.  MRI of the brain performed 11/27/2012 was abnormal showing extensive bilateral subcortical and ventricular white matter hyperintensities compatible with chronic demyelinating plaques. No enhancing lesions are seen, presence of atrophy of the corpus colossal and multiple T1 black holes indicate chronic disease. Compared to the MRI July 2006 white matter disease burden is slightly progressed while atrophy and T1 black holes are much more advanced. She denies any recent falls. She is looking for work . She continues to drive without difficulty. She denies any memory issues. She returns for reevaluation.   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 None of the foci appears to be acute. When compared to the MRI dated 11/27/2012, there is no interval change.   REVIEW OF SYSTEMS: Full 14 system review of systems performed and notable only for those listed, all others are neg:  As above  ALLERGIES: Allergies    Allergen Reactions  . Macrobid [Nitrofurantoin Macrocrystal] Hives    HOME MEDICATIONS: Outpatient Medications Prior to Visit  Medication Sig Dispense Refill  . calcium-vitamin D (OSCAL WITH D) 500-200 MG-UNIT tablet Take 2 tablets by mouth.     . carboxymethylcellulose (REFRESH PLUS) 0.5 % SOLN 1 drop daily as needed.    . CycloSPORINE (RESTASIS OP) Apply to eye.    . diclofenac sodium (VOLTAREN) 1 % GEL Apply topically as needed.    Marland Kitchen GILENYA 0.5 MG CAPS TAKE ONE CAPSULE (0.5 MG) BY MOUTH ONCE DAILY. MAY TAKE WITH OR WITHOUT FOOD. STORE AT ROOM TEMPERATURE. 90 capsule 0  . Multiple Vitamin (MULTIVITAMIN) tablet Take 1 tablet by mouth daily. 30 tablet 3  . Omega-3 Fatty Acids (FISH OIL PO) Take by mouth daily.    . valACYclovir (VALTREX) 500 MG tablet Take 1 tablet (500 mg total) by mouth daily. 90 tablet 0  . Wheat Dextrin (BENEFIBER DRINK MIX PO) Take by mouth. 2 tsp TID    . Omega-3 Fatty Acids (FISH OIL) 500 MG CAPS Take by mouth. Takes one daily     No facility-administered medications prior to visit.     PAST MEDICAL HISTORY: Past Medical History:  Diagnosis Date  . Abnormal Pap smear    ASC-H  . Chlamydia   . History of chicken pox   . HSV infection   . Multiple sclerosis ([place])   . Neuromuscular disorder ([place])    MS  . Yeast infection     PAST SURGICAL HISTORY: Past Surgical History:  Procedure Laterality Date  . CARPAL TUNNEL RELEASE    . CESAREAN SECTION     x2  . INCISION AND DRAINAGE  ABSCESS     left buttock  . TUBAL LIGATION      FAMILY HISTORY: Family History  Problem Relation Age of Onset  . Kidney failure Mother   . Diabetes Mother   . Colon cancer Neg Hx     SOCIAL HISTORY: Social History   Socioeconomic History  . Marital status: Single    Spouse name: Not on file  . Number of children: 2  . Years of education: College   . Highest education level: Not on file  Social Needs  . Financial resource strain: Not on file  . Food insecurity  - worry: Not on file  . Food insecurity - inability: Not on file  . Transportation needs - medical: Not on file  . Transportation needs - non-medical: Not on file  Occupational History  . Occupation: HOUSEKEEPING    Employer: [employer]  Tobacco Use  . Smoking status: Former Smoker    Last attempt to quit: 01/23/2000    Years since quitting: 16.9  . Smokeless tobacco: Never Used  Substance and Sexual Activity  . Alcohol use: Yes    Alcohol/week: 0.0 oz    Comment: occasional  . Drug use: No  . Sexual activity: Yes    Partners: Male    Birth control/protection: None, Surgical  Other Topics Concern  . Not on file  Social History Narrative   Patient lives at home with children.    Patient has 2 children.    Patient has a college degree.    Patient is single.    Patient is right handed.    Patient is currently employed.      PHYSICAL EXAM  Vitals:   12/19/16 1105  BP: 115/72  Pulse: 65  Weight: 146 lb (66.2 kg)  Height: 5\' 4"  (1.626 m)   Body mass index is 25.06 kg/m. Generalized: Well developed, in no acute distress ,Well-groomed Head: normocephalic and atraumatic,. Oropharynx benign  Neck: Supple,  Cardiac: Regular rate rhythm, no murmur  Musculoskeletal: No deformity  Neurological examination  Mentation: Alert oriented to time, place, history taking. Follows all commands speech and language fluent  Cranial nerve II-XII: Visual acuity 20/30 right, 20/20 left. Pupils were equal round reactive to light extraocular movements were full, visual field were full on confrontational test. Facial sensation and strength were normal. hearing was intact to finger rubbing bilaterally. Uvula tongue midline. head turning and shoulder shrug were normal and symmetric.Tongue protrusion into cheek strength was normal.  Motor: normal bulk and tone, full strength in the BUE, BLE, fine finger movements normal, no pronator drift.   Sensory: normal and symmetric to light touch, in  the upper and lower extremities Coordination: finger-nose-finger, heel-to-shin bilaterally, no dysmetria  Reflexes: Symmetric upper and lower plantar responses were flexor bilaterally.  Gait and Station: Rising up from seated position without assistance, normal stance, moderate stride, good arm swing, smooth turning, able to perform tiptoe, and heel walking without difficulty. Tandem gait is mildly unsteady. No assistive device   DIAGNOSTIC DATA (LABS, IMAGING, TESTING) - I reviewed patient records, labs, notes, testing and imaging myself where available.  Lab Results  Component Value Date   WBC 3.4 06/20/2016   HGB 12.8 06/20/2016   HCT 39.5 06/20/2016   MCV 99 (H) 06/20/2016   PLT 264 06/20/2016      Component Value Date/Time   NA 146 (H) 06/20/2016 1043   K 4.8 06/20/2016 1043   CL 106 06/20/2016 1043   CO2 26 06/20/2016 1043  GLUCOSE 94 06/20/2016 1043   GLUCOSE 80 02/17/2015 1353   BUN 11 06/20/2016 1043   CREATININE 0.76 06/20/2016 1043   CREATININE 0.77 02/17/2015 1353   CALCIUM 9.6 06/20/2016 1043   PROT 6.4 06/20/2016 1043   ALBUMIN 4.1 06/20/2016 1043   AST 23 06/20/2016 1043   ALT 20 06/20/2016 1043   ALKPHOS 63 06/20/2016 1043   BILITOT 0.4 06/20/2016 1043   GFRNONAA 90 06/20/2016 1043   GFRNONAA >89 02/17/2015 1353   GFRAA 104 06/20/2016 1043   GFRAA >89 02/17/2015 1353   Lab Results  Component Value Date   CHOL 227 (H) 02/17/2016   HDL 78 02/17/2016   LDLCALC 131 (H) 02/17/2016   TRIG 89 02/17/2016   CHOLHDL 2.9 02/17/2016   Lab Results  Component Value Date   HGBA1C 5.2 02/17/2016    ASSESSMENT AND PLAN 51 y.o. year old female Relapsing remitting multiple sclerosis  Repeat MRI of the brain showed no significant change, clinically she is stable  Continue Gilenya  Laboratory evaluations,  Return to clinic in 6 months  Marcial Pacas, M.D. Ph.D.  [facility] [place], [place] [address] Phone: [phone] Fax:       [phone]

## 2016-12-20 LAB — CBC WITH DIFFERENTIAL/PLATELET
Basophils Absolute: 0 10*3/uL (ref 0.0–0.2)
Basos: 0 %
EOS (ABSOLUTE): 0 10*3/uL (ref 0.0–0.4)
Eos: 0 %
Hematocrit: 40.3 % (ref 34.0–46.6)
Hemoglobin: 13.2 g/dL (ref 11.1–15.9)
Immature Grans (Abs): 0 10*3/uL (ref 0.0–0.1)
Immature Granulocytes: 0 %
Lymphocytes Absolute: 0.5 10*3/uL — ABNORMAL LOW (ref 0.7–3.1)
Lymphs: 14 %
MCH: 32.4 pg (ref 26.6–33.0)
MCHC: 32.8 g/dL (ref 31.5–35.7)
MCV: 99 fL — ABNORMAL HIGH (ref 79–97)
Monocytes Absolute: 0.4 10*3/uL (ref 0.1–0.9)
Monocytes: 9 %
Neutrophils Absolute: 2.9 10*3/uL (ref 1.4–7.0)
Neutrophils: 77 %
Platelets: 285 10*3/uL (ref 150–379)
RBC: 4.07 x10E6/uL (ref 3.77–5.28)
RDW: 14.3 % (ref 12.3–15.4)
WBC: 3.9 10*3/uL (ref 3.4–10.8)

## 2016-12-20 LAB — COMPREHENSIVE METABOLIC PANEL
ALT: 16 IU/L (ref 0–32)
AST: 30 IU/L (ref 0–40)
Albumin/Globulin Ratio: 1.9 (ref 1.2–2.2)
Albumin: 4.3 g/dL (ref 3.5–5.5)
Alkaline Phosphatase: 64 IU/L (ref 39–117)
BUN/Creatinine Ratio: 19 (ref 9–23)
BUN: 13 mg/dL (ref 6–24)
Bilirubin Total: 0.4 mg/dL (ref 0.0–1.2)
CO2: 29 mmol/L (ref 20–29)
Calcium: 10 mg/dL (ref 8.7–10.2)
Chloride: 103 mmol/L (ref 96–106)
Creatinine, Ser: 0.7 mg/dL (ref 0.57–1.00)
GFR calc Af Amer: 115 mL/min/{1.73_m2} (ref >59)
GFR calc non Af Amer: 100 mL/min/{1.73_m2} (ref >59)
Globulin, Total: 2.3 g/dL (ref 1.5–4.5)
Glucose: 75 mg/dL (ref 65–99)
Potassium: 5.1 mmol/L (ref 3.5–5.2)
Sodium: 144 mmol/L (ref 134–144)
Total Protein: 6.6 g/dL (ref 6.0–8.5)

## 2016-12-20 LAB — VITAMIN D 25 HYDROXY (VIT D DEFICIENCY, FRACTURES): Vit D, 25-Hydroxy: 36.5 ng/mL (ref 30.0–100.0)

## 2016-12-20 LAB — TSH: TSH: 0.927 u[IU]/mL (ref 0.450–4.500)

## 2016-12-27 ENCOUNTER — Telehealth: Payer: Self-pay | Admitting: *Deleted

## 2016-12-27 NOTE — Telephone Encounter (Signed)
Labs collected 12/19/16:  JCV 0.32 H  Inderterminate

## 2017-01-30 ENCOUNTER — Telehealth: Payer: Self-pay

## 2017-01-30 DIAGNOSIS — B009 Herpesviral infection, unspecified: Secondary | ICD-10-CM

## 2017-01-30 MED ORDER — VALACYCLOVIR HCL 500 MG PO TABS: 500.0000 mg | ORAL_TABLET | Freq: Every day | ORAL | 0 refills | Status: DC

## 2017-01-30 NOTE — Telephone Encounter (Signed)
Sent into pharmacy. Thanks!  

## 2017-02-14 ENCOUNTER — Other Ambulatory Visit (HOSPITAL_COMMUNITY)
Admission: RE | Admit: 2017-02-14 | Discharge: 2017-02-14 | Disposition: A | Discharge status: Home / Self Care | Payer: Medicaid Other | Source: Ambulatory Visit | Attending: Obstetrics | Admitting: Obstetrics

## 2017-02-14 ENCOUNTER — Ambulatory Visit: Payer: Medicaid Other | Admitting: Obstetrics

## 2017-02-14 ENCOUNTER — Encounter: Payer: Self-pay | Admitting: Obstetrics

## 2017-02-14 ENCOUNTER — Other Ambulatory Visit: Payer: Self-pay

## 2017-02-14 VITALS — BP 121/78 | HR 83 | Ht 64.0 in | Wt 138.0 lb

## 2017-02-14 DIAGNOSIS — R87810 Cervical high risk human papillomavirus (HPV) DNA test positive: Secondary | ICD-10-CM

## 2017-02-14 DIAGNOSIS — N898 Other specified noninflammatory disorders of vagina: Secondary | ICD-10-CM | POA: Diagnosis present

## 2017-02-14 DIAGNOSIS — Z01419 Encounter for gynecological examination (general) (routine) without abnormal findings: Secondary | ICD-10-CM

## 2017-02-14 DIAGNOSIS — Z0000 Encounter for general adult medical examination without abnormal findings: Secondary | ICD-10-CM

## 2017-02-14 DIAGNOSIS — Z780 Asymptomatic menopausal state: Secondary | ICD-10-CM

## 2017-02-14 NOTE — Progress Notes (Signed)
Presents for AEX/PAP, wants STD testing.

## 2017-02-14 NOTE — Progress Notes (Signed)
Subjective:        Kelly Grant is a 52 y.o. female here for a routine exam.  Current complaints: None.    Personal health questionnaire:  Is [name], have a family history of breast and/or ovarian cancer: no Is there a family history of uterine cancer diagnosed at age < 39, gastrointestinal cancer, urinary tract cancer, family member who is a Field seismologist syndrome-associated carrier: no Is the patient overweight and hypertensive, family history of diabetes, personal history of gestational diabetes, preeclampsia or PCOS: no Is patient over 44, have PCOS,  family history of premature CHD under age 25, diabetes, smoke, have hypertension or peripheral artery disease:  no At any time, has a partner hit, kicked or otherwise hurt or frightened you?: no Over the past 2 weeks, have you felt down, depressed or hopeless?: no Over the past 2 weeks, have you felt little interest or pleasure in doing things?:no   Gynecologic History No LMP recorded. Patient is not currently having periods (Reason: Perimenopausal). Contraception: tubal ligation Last Pap: 2015. Results were: Negative for dysplasia.  Positive High Risk HPV Last mammogram: 2018. Results were: normal  Obstetric History OB History  Gravida Para Term Preterm AB Living  3 2          SAB TAB Ectopic Multiple Live Births               # Outcome Date GA Lbr Len/2nd Weight Sex Delivery Anes PTL Lv  3 Para           2 Para           1 Gravida               Past Medical History:  Diagnosis Date  . Abnormal Pap smear    ASC-H  . Chlamydia   . History of chicken pox   . HSV infection   . Multiple sclerosis ([place])   . Neuromuscular disorder ([place])    MS  . Yeast infection     Past Surgical History:  Procedure Laterality Date  . CARPAL TUNNEL RELEASE    . CESAREAN SECTION     x2  . INCISION AND DRAINAGE ABSCESS     left buttock  . TUBAL LIGATION       Current Outpatient Medications:  .  calcium-vitamin D  (OSCAL WITH D) 500-200 MG-UNIT tablet, Take 2 tablets by mouth. , Disp: , Rfl:  .  carboxymethylcellulose (REFRESH PLUS) 0.5 % SOLN, 1 drop daily as needed., Disp: , Rfl:  .  CycloSPORINE (RESTASIS OP), Apply to eye., Disp: , Rfl:  .  diclofenac sodium (VOLTAREN) 1 % GEL, Apply topically as needed., Disp: , Rfl:  .  GILENYA 0.5 MG CAPS, TAKE ONE CAPSULE (0.5 MG) BY MOUTH ONCE DAILY. MAY TAKE WITH OR WITHOUT FOOD. STORE AT ROOM TEMPERATURE., Disp: 90 capsule, Rfl: 0 .  Multiple Vitamin (MULTIVITAMIN) tablet, Take 1 tablet by mouth daily., Disp: 30 tablet, Rfl: 3 .  Omega-3 Fatty Acids (FISH OIL PO), Take by mouth daily., Disp: , Rfl:  .  valACYclovir (VALTREX) 500 MG tablet, Take 1 tablet (500 mg total) by mouth daily., Disp: 90 tablet, Rfl: 0 .  Wheat Dextrin (BENEFIBER DRINK MIX PO), Take by mouth. 2 tsp TID, Disp: , Rfl:  Allergies  Allergen Reactions  . Macrobid [Nitrofurantoin Macrocrystal] Hives    Social History   Tobacco Use  . Smoking status: Former Smoker    Last attempt to quit: 01/23/2000  Years since quitting: 17.0  . Smokeless tobacco: Never Used  Substance Use Topics  . Alcohol use: Yes    Alcohol/week: 0.0 oz    Comment: occasional    Family History  Problem Relation Age of Onset  . Kidney failure Mother   . Diabetes Mother   . Colon cancer Neg Hx       Review of Systems  Constitutional: negative for fatigue and weight loss Respiratory: negative for cough and wheezing Cardiovascular: negative for chest pain, fatigue and palpitations Gastrointestinal: negative for abdominal pain and change in bowel habits Musculoskeletal:negative for myalgias Neurological: negative for gait problems and tremors Behavioral/Psych: negative for abusive relationship, depression Endocrine: negative for temperature intolerance    Genitourinary:negative for abnormal menstrual periods, genital lesions, hot flashes, sexual problems and vaginal discharge Integument/breast: negative for  breast lump, breast tenderness, nipple discharge and skin lesion(s)    Objective:       BP 121/78   Pulse 83   Ht 5\' 4"  (1.626 m)   Wt 138 lb (62.6 kg)   BMI 23.69 kg/m  General:   alert  Skin:   no rash or abnormalities  Lungs:   clear to auscultation bilaterally  Heart:   regular rate and rhythm, S1, S2 normal, no murmur, click, rub or gallop  Breasts:   normal without suspicious masses, skin or nipple changes or axillary nodes  Abdomen:  normal findings: no organomegaly, soft, non-tender and no hernia  Pelvis:  External genitalia: normal general appearance Urinary system: urethral meatus normal and bladder without fullness, nontender Vaginal: normal without tenderness, induration or masses Cervix: normal appearance Adnexa: normal bimanual exam Uterus: anteverted and non-tender, normal size   Lab Review Urine pregnancy test Labs reviewed yes Radiologic studies reviewed yes  50% of 20 min visit spent on counseling and coordination of care.   Assessment:     1. Encounter for routine gynecological examination with Papanicolaou smear of cervix - doing well  2. Postmenopause - no problems  3. Cervical high risk HPV (human papillomavirus) test positive - yearly pap smears    Plan:    Education reviewed: calcium supplements, depression evaluation, low fat, low cholesterol diet, safe sex/STD prevention, self breast exams, skin cancer screening and weight bearing exercise. Contraception: post menopausal status. Follow up in: 1 year.   No orders of the defined types were placed in this encounter.  No orders of the defined types were placed in this encounter.   Shelly Bombard MD

## 2017-02-15 ENCOUNTER — Encounter: Payer: Self-pay | Admitting: Family Medicine

## 2017-02-15 ENCOUNTER — Ambulatory Visit (INDEPENDENT_AMBULATORY_CARE_PROVIDER_SITE_OTHER): Payer: Medicaid Other | Admitting: Family Medicine

## 2017-02-15 VITALS — BP 129/83 | HR 68 | Temp 97.9°F | Resp 16 | Ht 64.0 in | Wt 134.0 lb

## 2017-02-15 DIAGNOSIS — E785 Hyperlipidemia, unspecified: Secondary | ICD-10-CM | POA: Diagnosis not present

## 2017-02-15 LAB — CYTOLOGY - PAP
Diagnosis: NEGATIVE
HPV: DETECTED — AB

## 2017-02-15 LAB — CERVICOVAGINAL ANCILLARY ONLY
Chlamydia: NEGATIVE
Neisseria Gonorrhea: NEGATIVE

## 2017-02-15 MED ORDER — ASPIRIN EC 81 MG PO TBEC: 81.0000 mg | DELAYED_RELEASE_TABLET | Freq: Every day | ORAL | 11 refills | Status: AC

## 2017-02-15 NOTE — Progress Notes (Signed)
Subjective:    Kelly Grant is a 52 y.o. female with a history of multiple sclerosis presents for a 6 month follow up of dyslipidemia. The patient does not use medications that may worsen dyslipidemias (corticosteroids, progestins, anabolic steroids, diuretics, beta-blockers, amiodarone, cyclosporine, olanzapine). The patient exercises occasionally. The patient is not known to have coexisting coronary artery disease.  Past Medical History:  Diagnosis Date  . Abnormal Pap smear    ASC-H  . Chlamydia   . History of chicken pox   . HSV infection   . Multiple sclerosis ([place])   . Neuromuscular disorder ([place])    MS  . Yeast infection    Social History   Socioeconomic History  . Marital status: Single    Spouse name: Not on file  . Number of children: 2  . Years of education: College   . Highest education level: Not on file  Social Needs  . Financial resource strain: Not on file  . Food insecurity - worry: Not on file  . Food insecurity - inability: Not on file  . Transportation needs - medical: Not on file  . Transportation needs - non-medical: Not on file  Occupational History  . Occupation: HOUSEKEEPING    Employer: [employer]  Tobacco Use  . Smoking status: Former Smoker    Last attempt to quit: 01/23/2000    Years since quitting: 17.0  . Smokeless tobacco: Never Used  Substance and Sexual Activity  . Alcohol use: Yes    Alcohol/week: 0.0 oz    Comment: occasional  . Drug use: No  . Sexual activity: Yes    Partners: Male    Birth control/protection: None, Surgical  Other Topics Concern  . Not on file  Social History Narrative   Patient lives at home with children.    Patient has 2 children.    Patient has a college degree.    Patient is single.    Patient is right handed.    Patient is currently employed.    Immunization History  Administered Date(s) Administered  . Influenza,inj,Quad PF,6+ Mos 11/05/2013, 10/08/2014  . Tdap 03/04/2012, 08/05/2013   Review  of Systems  Constitutional: Negative.   HENT: Negative.   Eyes: Negative.   Respiratory: Negative.   Gastrointestinal: Negative.   Genitourinary: Negative.   Musculoskeletal: Positive for back pain (occasional).  Skin: Negative.   Neurological: Negative.     Objective:  BP 129/83 (BP Location: Left Arm, Patient Position: Sitting, Cuff Size: Normal)   Pulse 68   Temp 97.9 F (36.6 C) (Oral)   Resp 16   Ht 5\' 4"  (1.626 m)   Wt 134 lb (60.8 kg)   SpO2 98%   BMI 23.00 kg/m   General Appearance:    Alert, cooperative, no distress, appears stated age  Head:    Normocephalic, without obvious abnormality, atraumatic  Eyes:    PERRL, conjunctiva/corneas clear, EOM's intact, fundi    benign, both eyes  Ears:    Normal TM's and external ear canals, both ears  Nose:   Nares normal, septum midline, mucosa normal, no drainage    or sinus tenderness  Throat:   Lips, mucosa, and tongue normal; teeth and gums normal  Neck:   Supple, symmetrical, trachea midline, no adenopathy;    thyroid:  no enlargement/tenderness/nodules; no carotid   bruit or JVD  Back:     Symmetric, no curvature, ROM normal, no CVA tenderness  Lungs:     Clear to auscultation bilaterally, respirations  unlabored  Chest Wall:    No tenderness or deformity   Heart:    Regular rate and rhythm, S1 and S2 normal, no murmur, rub   or gallop  Abdomen:     Soft, non-tender, bowel sounds active all four quadrants,    no masses, no organomegaly  Extremities:   Extremities normal, atraumatic, no cyanosis or edema  Pulses:   2+ and symmetric all extremities  Skin:   Skin color, texture, turgor normal, no rashes or lesions  Lymph nodes:   Cervical, supraclavicular, and axillary nodes normal  Neurologic:   CNII-XII intact, normal strength, sensation and reflexes    throughout   Lab Review Lab Results  Component Value Date   CHOL 227 (H) 02/17/2016   CHOL 216 (H) 02/17/2015   CHOL 208 (H) 08/09/2014   HDL 78 02/17/2016    HDL 59 02/17/2015   HDL 59 08/09/2014      Assessment:    Dyslipidemia as detailed above with several CHD risk factors using NCEP scheme above.  Target levels for LDL are: < 100 mg/dl (CHD or "CHD risk equivalent" is present)  Explained to the patient the respective contributions of genetics, diet, and exercise to lipid levels and the use of medication in severe cases which do not respond to lifestyle alteration. The patient's interest and motivation in making lifestyle changes seems good.    Plan:    The following changes are planned for the next 6 months, at which time the patient will return for repeat fasting lipids:   Dietary changes: Reduce saturated fat, "trans" monounsaturated fatty acids, and cholesterol Lipid-lowering medications: None indicated unless lipids or risk profile worsen in the future.  (Recommended by NCEP after 3-6 mos of dietary therapy & lifestyle modification,  except if CHD is present or LDL well above 190.)  Note: The majority of the visit was spent in counseling on the pathophysiology and treatment of dyslipidemias. The total face-to-face time was in excess of  15 minutes.    RTC: Fasting lipid panel in 1 month, follow up in office in 6 months   [place]  MSN, FNP-C [facility] [address] [place], [place] [address] [phone]

## 2017-02-15 NOTE — Patient Instructions (Addendum)
Your EKG is within normal limits. Will follow up in 1 month for fasting labs.  Recommend a lowfat, low carbohydrate diet divided over 5-6 small meals, increase water intake to 6-8 glasses, and 150 minutes per week of cardiovascular exercise.    Back Exercises If you have pain in your back, do these exercises 2-3 times each day or as told by your doctor. When the pain goes away, do the exercises once each day, but repeat the steps more times for each exercise (do more repetitions). If you do not have pain in your back, do these exercises once each day or as told by your doctor. Exercises Single Knee to Chest  Do these steps 3-5 times in a row for each leg: 1. Lie on your back on a firm bed or the floor with your legs stretched out. 2. Bring one knee to your chest. 3. Hold your knee to your chest by grabbing your knee or thigh. 4. Pull on your knee until you feel a gentle stretch in your lower back. 5. Keep doing the stretch for 10-30 seconds. 6. Slowly let go of your leg and straighten it.  Pelvic Tilt  Do these steps 5-10 times in a row: 1. Lie on your back on a firm bed or the floor with your legs stretched out. 2. Bend your knees so they point up to the ceiling. Your feet should be flat on the floor. 3. Tighten your lower belly (abdomen) muscles to press your lower back against the floor. This will make your tailbone point up to the ceiling instead of pointing down to your feet or the floor. 4. Stay in this position for 5-10 seconds while you gently tighten your muscles and breathe evenly.  Cat-Cow  Do these steps until your lower back bends more easily: 1. Get on your hands and knees on a firm surface. Keep your hands under your shoulders, and keep your knees under your hips. You may put padding under your knees. 2. Let your head hang down, and make your tailbone point down to the floor so your lower back is round like the back of a cat. 3. Stay in this position for 5  seconds. 4. Slowly lift your head and make your tailbone point up to the ceiling so your back hangs low (sags) like the back of a cow. 5. Stay in this position for 5 seconds.  Press-Ups  Do these steps 5-10 times in a row: 1. Lie on your belly (face-down) on the floor. 2. Place your hands near your head, about shoulder-width apart. 3. While you keep your back relaxed and keep your hips on the floor, slowly straighten your arms to raise the top half of your body and lift your shoulders. Do not use your back muscles. To make yourself more comfortable, you may change where you place your hands. 4. Stay in this position for 5 seconds. 5. Slowly return to lying flat on the floor.  Bridges  Do these steps 10 times in a row: 1. Lie on your back on a firm surface. 2. Bend your knees so they point up to the ceiling. Your feet should be flat on the floor. 3. Tighten your butt muscles and lift your butt off of the floor until your waist is almost as high as your knees. If you do not feel the muscles working in your butt and the back of your thighs, slide your feet 1-2 inches farther away from your butt. 4. Stay in this  position for 3-5 seconds. 5. Slowly lower your butt to the floor, and let your butt muscles relax.  If this exercise is too easy, try doing it with your arms crossed over your chest. Belly Crunches  Do these steps 5-10 times in a row: 1. Lie on your back on a firm bed or the floor with your legs stretched out. 2. Bend your knees so they point up to the ceiling. Your feet should be flat on the floor. 3. Cross your arms over your chest. 4. Tip your chin a little bit toward your chest but do not bend your neck. 5. Tighten your belly muscles and slowly raise your chest just enough to lift your shoulder blades a tiny bit off of the floor. 6. Slowly lower your chest and your head to the floor.  Back Lifts Do these steps 5-10 times in a row: 1. Lie on your belly (face-down) with your  arms at your sides, and rest your forehead on the floor. 2. Tighten the muscles in your legs and your butt. 3. Slowly lift your chest off of the floor while you keep your hips on the floor. Keep the back of your head in line with the curve in your back. Look at the floor while you do this. 4. Stay in this position for 3-5 seconds. 5. Slowly lower your chest and your face to the floor.  Contact a doctor if:  Your back pain gets a lot worse when you do an exercise.  Your back pain does not lessen 2 hours after you exercise. If you have any of these problems, stop doing the exercises. Do not do them again unless your doctor says it is okay. Get help right away if:  You have sudden, very bad back pain. If this happens, stop doing the exercises. Do not do them again unless your doctor says it is okay. This information is not intended to replace advice given to you by your health care provider. Make sure you discuss any questions you have with your health care provider. Document Released: 02/10/2010 Document Revised: 06/16/2015 Document Reviewed: 03/04/2014 Elsevier Interactive Patient Education  2018 [employer].  Cholesterol Cholesterol is a fat. Your body needs a small amount of cholesterol. Cholesterol (plaque) may build up in your blood vessels (arteries). That makes you more likely to have a heart attack or stroke. You cannot feel your cholesterol level. Having a blood test is the only way to find out if your level is high. Keep your test results. Work with your doctor to keep your cholesterol at a good level. What do the results mean?  Total cholesterol is how much cholesterol is in your blood.  LDL is bad cholesterol. This is the type that can build up. Try to have low LDL.  HDL is good cholesterol. It cleans your blood vessels and carries LDL away. Try to have high HDL.  Triglycerides are fat that the body can store or burn for energy. What are good levels of cholesterol?  Total  cholesterol below 200.  LDL below 100 is good for people who have health risks. LDL below 70 is good for people who have very high risks.  HDL above 40 is good. It is best to have HDL of 60 or higher.  Triglycerides below 150. How can I lower my cholesterol? Diet Follow your diet program as told by your doctor.  Choose fish, white meat chicken, or [place] that is roasted or baked. Try not to eat red  meat, fried foods, sausage, or lunch meats.  Eat lots of fresh fruits and vegetables.  Choose whole grains, beans, pasta, potatoes, and cereals.  Choose olive oil, corn oil, or canola oil. Only use small amounts.  Try not to eat butter, mayonnaise, shortening, or palm kernel oils.  Try not to eat foods with trans fats.  Choose low-fat or nonfat dairy foods. ? Drink skim or nonfat milk. ? Eat low-fat or nonfat yogurt and cheeses. ? Try not to drink whole milk or cream. ? Try not to eat ice cream, egg yolks, or full-fat cheeses.  Healthy desserts include angel food cake, ginger snaps, animal crackers, hard candy, popsicles, and low-fat or nonfat frozen yogurt. Try not to eat pastries, cakes, pies, and cookies.  Exercise Follow your exercise program as told by your doctor.  Be more active. Try gardening, walking, and taking the stairs.  Ask your doctor about ways that you can be more active.  Medicine  Take over-the-counter and prescription medicines only as told by your doctor. This information is not intended to replace advice given to you by your health care provider. Make sure you discuss any questions you have with your health care provider. Document Released: 04/06/2008 Document Revised: 08/10/2015 Document Reviewed: 07/21/2015 Elsevier Interactive Patient Education  [employer].

## 2017-02-26 ENCOUNTER — Other Ambulatory Visit: Payer: Self-pay | Admitting: Nurse Practitioner

## 2017-02-27 ENCOUNTER — Other Ambulatory Visit: Payer: Self-pay

## 2017-02-27 MED ORDER — FINGOLIMOD HCL 0.5 MG PO CAPS: ORAL_CAPSULE | ORAL | 1 refills | Status: DC

## 2017-03-18 ENCOUNTER — Other Ambulatory Visit: Payer: Medicaid Other

## 2017-03-18 DIAGNOSIS — E785 Hyperlipidemia, unspecified: Secondary | ICD-10-CM

## 2017-03-19 LAB — LIPID PANEL
Chol/HDL Ratio: 3 ratio (ref 0.0–4.4)
Cholesterol, Total: 213 mg/dL — ABNORMAL HIGH (ref 100–199)
HDL: 71 mg/dL (ref >39)
LDL Calculated: 121 mg/dL — ABNORMAL HIGH (ref 0–99)
Triglycerides: 103 mg/dL (ref 0–149)
VLDL Cholesterol Cal: 21 mg/dL (ref 5–40)

## 2017-03-20 ENCOUNTER — Other Ambulatory Visit: Payer: Self-pay | Admitting: Family Medicine

## 2017-03-20 DIAGNOSIS — E785 Hyperlipidemia, unspecified: Secondary | ICD-10-CM

## 2017-03-20 MED ORDER — OMEGA-3-ACID ETHYL ESTERS 1 G PO CAPS: 1.0000 g | ORAL_CAPSULE | Freq: Every day | ORAL | 5 refills | Status: DC

## 2017-03-20 NOTE — Progress Notes (Signed)
Meds ordered this encounter  Medications  . omega-3 acid ethyl esters (LOVAZA) 1 g capsule    Sig: Take 1 capsule (1 g total) by mouth daily.    Dispense:  30 capsule    Refill:  [place]  MSN, FNP-C [facility] [address] [place], [place] [address] [phone]

## 2017-03-21 ENCOUNTER — Telehealth: Payer: Self-pay

## 2017-03-21 NOTE — Telephone Encounter (Signed)
-----   Message from Dorena Dew, [place] sent at 03/20/2017  5:12 PM EST ----- Regarding: lab results Please inform patient that cholesterol is elevated. LDL is 121, goal is < 100. Sent Lovaza (fish oil) to pharmacy. Take every evening with dinner.   Recommend a lowfat, low carbohydrate diet divided over 5-6 small meals, increase water intake to 6-8 glasses, and 150 minutes per week of cardiovascular exercise.   Thanks

## 2017-03-21 NOTE — Telephone Encounter (Signed)
Called and spoke with patient, advised that cholesterol is elevated, and LDL is 121 which is above goal. Advised to take Fish oil that was sent to pharmacy every evening with dinner. Advised to eat a lowfat/ low carb diet over 5 to 6 small meals daily, drink 6 to 8 glasses of water daily, and exercise 150 minutes weekly of cardio. Asked that she keep next scheduled appointment. Thanks!

## 2017-05-04 ENCOUNTER — Other Ambulatory Visit: Payer: Self-pay | Admitting: Family Medicine

## 2017-05-04 DIAGNOSIS — B009 Herpesviral infection, unspecified: Secondary | ICD-10-CM

## 2017-06-18 NOTE — Progress Notes (Signed)
Minutes  [facility]  PATIENT: Kelly Grant DOB: 1965-07-05   REASON FOR VISIT: Follow-up for multiple sclerosis, relapsing remitting HISTORY FROM: Patient    HISTORY OF PRESENT ILLNESS:UPDATE 5/29/2019CM [name], 52 year old female returns for follow-up with a history of relapsing remitting multiple sclerosis.  She is currently on Gilenya tolerating the medication without side effect.  MRI of the brain June 2018 with chronic demyelinating plaques associated with multiple sclerosis none of these were acute, there is no interval change.  She she denies relation of MS symptoms.  No visual changes , swallowing problems, bowel or bladder issues.  She denies any falls.  She continues to drive without difficulty.  She returns for reevaluation.  She still works part-time.  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 None of the foci appears to be acute. When compared to the MRI dated 11/27/2012, there is no interval change.    UPDATE 05/30/2018CM [name], 52 year old female returns for follow-up with a history of relapsing remitting multiple sclerosis. Her initial symptoms were optic neuritis in the 80s. Last MRI of the brain performed on 11/27/2012 was abnormal showing extensive bilateral subcortical and ventricular white matter hyperintensities compatible with chronic demyelinating plaques. No enhancing lesions are seen, presence of atrophy of the corpus colossal and multiple T1 black holes indicate chronic disease. She is currently on Gilenya without side effects and no exacerbation of her  symptoms. She denies any visual changes each or swallowing problems new  weakness bowel or bladder issues, or muscle spasms. She has not had any falls. She was recently placed on fish oil  for elevated cholesterol . She returns for reevaluation   12/22/15 CM [name],52 year old female returns for followup. She was last seen in this office in 06/21/15  She has a history of multiple sclerosis relapsing remitting. Initial symptoms were right optic neuritis in the 1980s then in May 2006 a left optic neuritis. MRI of the brain at that time showed typical MS lesions. She is currently on Gilenya tolerating the medication without difficulty. Today she denies spasms, focal weakness, sensory changes, visual changes, speech or swallowing problems, no problems with bowel or bladder function. No exacerbation since last seen, and no new neurologic complaints. Most recent MRI of the brain performed 11/27/2012 was abnormal showing extensive bilateral subcortical and ventricular white matter hyperintensities compatible with chronic demyelinating plaques. No enhancing lesions are seen, presence of atrophy of the corpus colossal and multiple T1 black holes indicate chronic disease. Compared to the MRI July 2006 white matter disease burden is slightly progressed while atrophy and T1 black holes are much more advanced. She denies any recent falls. She is looking for work . She continues to drive without difficulty. She denies any memory issues. She returns for reevaluation.   REVIEW OF SYSTEMS: Full 14 system review of systems performed and notable only for those listed, all others are neg:  Constitutional: neg  Cardiovascular: neg Ear/Nose/Throat: neg  Skin: neg Eyes: neg Respiratory: neg Gastroitestinal: neg  Hematology/Lymphatic: neg  Endocrine: neg Musculoskeletal:neg Allergy/Immunology: neg Neurological: neg Psychiatric: neg Sleep : neg   ALLERGIES: Allergies  Allergen Reactions  . Macrobid  [  Nitrofurantoin Macrocrystal] Hives    HOME MEDICATIONS: Outpatient Medications Prior to Visit  Medication Sig Dispense Refill  . aspirin EC 81 MG tablet Take 1 tablet (81 mg total) by mouth daily. 30 tablet 11  . calcium-vitamin D (OSCAL WITH D) 500-200 MG-UNIT tablet Take 2 tablets by mouth.     . carboxymethylcellulose (REFRESH PLUS) 0.5 % SOLN 1 drop daily as needed.    . CycloSPORINE (RESTASIS OP) Apply to eye.    . diclofenac sodium (VOLTAREN) 1 % GEL Apply topically as needed.    . Fingolimod HCl (GILENYA) 0.5 MG CAPS TAKE ONE CAPSULE (0.5 MG) BY MOUTH ONCE DAILY. MAY TAKE WITH OR WITHOUT FOOD. STORE AT ROOM TEMPERATURE. 90 capsule 1  . Multiple Vitamin (MULTIVITAMIN) tablet Take 1 tablet by mouth daily. 30 tablet 3  . omega-3 acid ethyl esters (LOVAZA) 1 g capsule Take 1 capsule (1 g total) by mouth daily. 30 capsule 5  . valACYclovir (VALTREX) 500 MG tablet TAKE 1 TABLET BY MOUTH ONCE DAILY 90 tablet 0  . Wheat Dextrin (BENEFIBER DRINK MIX PO) Take by mouth. 2 tsp TID    . Omega-3 Fatty Acids (FISH OIL PO) Take by mouth daily.     No facility-administered medications prior to visit.     PAST MEDICAL HISTORY: Past Medical History:  Diagnosis Date  . Abnormal Pap smear    ASC-H  . Chlamydia   . History of chicken pox   . HSV infection   . Multiple sclerosis ([place])   . Neuromuscular disorder ([place])    MS  . Yeast infection     PAST SURGICAL HISTORY: Past Surgical History:  Procedure Laterality Date  . CARPAL TUNNEL RELEASE    . CESAREAN SECTION     x2  . INCISION AND DRAINAGE ABSCESS     left buttock  . TUBAL LIGATION      FAMILY HISTORY: Family History  Problem Relation Age of Onset  . Kidney failure Mother   . Diabetes Mother   . Colon cancer Neg Hx     SOCIAL HISTORY: Social History   Socioeconomic History  . Marital status: Single    Spouse name: Not on file  . Number of children: 2  . Years of education: College   . Highest education level: Not  on file  Occupational History  . Occupation: HOUSEKEEPING    Employer: [employer]  . Financial resource strain: Not on file  . Food insecurity:    Worry: Not on file    Inability: Not on file  . Transportation needs:    Medical: Not on file    Non-medical: Not on file  Tobacco Use  . Smoking status: Former Smoker    Last attempt to quit: 01/23/2000    Years since quitting: 17.4  . Smokeless tobacco: Never Used  Substance and Sexual Activity  . Alcohol use: Yes    Alcohol/week: 0.0 oz    Comment: occasional  . Drug use: No  . Sexual activity: Yes    Partners: Male    Birth control/protection: None, Surgical  Lifestyle  . Physical activity:    Days per week: Not on file    Minutes per session: Not on file  . Stress: Not on file  Relationships  . Social connections:    Talks on phone: Not on file    Gets together: Not on file    Attends religious service: Not on file    Active member of club  or organization: Not on file    Attends meetings of clubs or organizations: Not on file    Relationship status: Not on file  . Intimate partner violence:    Fear of current or ex partner: Not on file    Emotionally abused: Not on file    Physically abused: Not on file    Forced sexual activity: Not on file  Other Topics Concern  . Not on file  Social History Narrative   Patient lives at home with children.    Patient has 2 children.    Patient has a college degree.    Patient is single.    Patient is right handed.    Patient is currently employed.      PHYSICAL EXAM  Vitals:   06/19/17 1029  BP: 126/69  Pulse: 76  Weight: 144 lb (65.3 kg)  Height: 5\' 4"  (1.626 m)   Body mass index is 24.72 kg/m. Generalized: Well developed, in no acute distress ,Well-groomed Head: normocephalic and atraumatic,. Oropharynx benign  Neck: Supple,  Cardiac: Regular rate rhythm, no murmur  Musculoskeletal: No deformity  Neurological examination  Mentation: Alert  oriented to time, place, history taking. Follows all commands speech and language fluent  Cranial nerve II-XII: Visual acuity 20/30 bilateral Pupils were equal round reactive to light extraocular movements were full, visual field were full on confrontational test. Facial sensation and strength were normal. hearing was intact to finger rubbing bilaterally. Uvula tongue midline. head turning and shoulder shrug were normal and symmetric.Tongue protrusion into cheek strength was normal.  Motor: normal bulk and tone, full strength in the BUE, BLE, fine finger movements normal, no pronator drift.   Sensory: normal and symmetric to light touch, pinprick and vibratory in the upper and lower extremities Coordination: finger-nose-finger, heel-to-shin bilaterally, no dysmetria  Reflexes: Symmetric upper and lower plantar responses were flexor bilaterally.  Gait and Station: Rising up from seated position without assistance, normal stance, moderate stride, good arm swing, smooth turning, able to perform tiptoe, and heel walking without difficulty. Tandem gait is mildly unsteady. No assistive device   DIAGNOSTIC DATA (LABS, IMAGING, TESTING) - I reviewed patient records, labs, notes, testing and imaging myself where available.  Lab Results  Component Value Date   WBC 3.9 12/19/2016   HGB 13.2 12/19/2016   HCT 40.3 12/19/2016   MCV 99 (H) 12/19/2016   PLT 285 12/19/2016      Component Value Date/Time   NA 144 12/19/2016 1150   K 5.1 12/19/2016 1150   CL 103 12/19/2016 1150   CO2 29 12/19/2016 1150   GLUCOSE 75 12/19/2016 1150   GLUCOSE 80 02/17/2015 1353   BUN 13 12/19/2016 1150   CREATININE 0.70 12/19/2016 1150   CREATININE 0.77 02/17/2015 1353   CALCIUM 10.0 12/19/2016 1150   PROT 6.6 12/19/2016 1150   ALBUMIN 4.3 12/19/2016 1150   AST 30 12/19/2016 1150   ALT 16 12/19/2016 1150   ALKPHOS 64 12/19/2016 1150   BILITOT 0.4 12/19/2016 1150   GFRNONAA 100 12/19/2016 1150   GFRNONAA >89  02/17/2015 1353   GFRAA 115 12/19/2016 1150   GFRAA >89 02/17/2015 1353   Lab Results  Component Value Date   CHOL 213 (H) 03/18/2017   HDL 71 03/18/2017   LDLCALC 121 (H) 03/18/2017   TRIG 103 03/18/2017   CHOLHDL 3.0 03/18/2017   Lab Results  Component Value Date   HGBA1C 5.2 02/17/2016    ASSESSMENT AND PLAN 52 y.o. year old female has  a past medical history of Multiple sclerosis ([place]); and Neuromuscular disorder ([place]). here to follow-up. She has not had an exacerbation of her MS symptoms since last seen. She is currently on Gilenya without side effects.   PLAN: Will obtain CBC, CMP  today to monitor for adverse effects of Gilenya Continue Gilenya at current dose,  Repeat MRI of the brain 07/01/16 showed no significant change, clinically she is stable Exercise by walking for overall health and well-being Stay well-hydrated Follow-up in 6 months,  Dennie Bible, [facility], [facility], [facility] [address], [place], [place] [address] [phone]

## 2017-06-19 ENCOUNTER — Ambulatory Visit: Payer: Medicaid Other | Admitting: Nurse Practitioner

## 2017-06-19 ENCOUNTER — Encounter: Payer: Self-pay | Admitting: Nurse Practitioner

## 2017-06-19 VITALS — BP 126/69 | HR 76 | Ht 64.0 in | Wt 144.0 lb

## 2017-06-19 DIAGNOSIS — Z5181 Encounter for therapeutic drug level monitoring: Secondary | ICD-10-CM

## 2017-06-19 DIAGNOSIS — G35 Multiple sclerosis: Secondary | ICD-10-CM

## 2017-06-19 NOTE — Patient Instructions (Signed)
Will obtain CBC, CMP  today to monitor for adverse effects of Gilenya Continue Gilenya at current dose,  Repeat MRI of the brain 07/01/16 showed no significant change, clinically she is stable Exercise by walking for overall health and well-being Stay well-hydrated Follow-up in 6 months,

## 2017-06-20 ENCOUNTER — Telehealth: Payer: Self-pay | Admitting: *Deleted

## 2017-06-20 LAB — CBC WITH DIFFERENTIAL/PLATELET
Basophils Absolute: 0 10*3/uL (ref 0.0–0.2)
Basos: 0 %
EOS (ABSOLUTE): 0 10*3/uL (ref 0.0–0.4)
Eos: 1 %
Hematocrit: 38.5 % (ref 34.0–46.6)
Hemoglobin: 12.6 g/dL (ref 11.1–15.9)
Immature Grans (Abs): 0 10*3/uL (ref 0.0–0.1)
Immature Granulocytes: 0 %
Lymphocytes Absolute: 0.6 10*3/uL — ABNORMAL LOW (ref 0.7–3.1)
Lymphs: 21 %
MCH: 32.7 pg (ref 26.6–33.0)
MCHC: 32.7 g/dL (ref 31.5–35.7)
MCV: 100 fL — ABNORMAL HIGH (ref 79–97)
Monocytes Absolute: 0.6 10*3/uL (ref 0.1–0.9)
Monocytes: 21 %
Neutrophils Absolute: 1.5 10*3/uL (ref 1.4–7.0)
Neutrophils: 57 %
Platelets: 272 10*3/uL (ref 150–450)
RBC: 3.85 x10E6/uL (ref 3.77–5.28)
RDW: 14.5 % (ref 12.3–15.4)
WBC: 2.7 10*3/uL — ABNORMAL LOW (ref 3.4–10.8)

## 2017-06-20 LAB — COMPREHENSIVE METABOLIC PANEL
ALT: 29 IU/L (ref 0–32)
AST: 32 IU/L (ref 0–40)
Albumin/Globulin Ratio: 2.4 — ABNORMAL HIGH (ref 1.2–2.2)
Albumin: 4.3 g/dL (ref 3.5–5.5)
Alkaline Phosphatase: 61 IU/L (ref 39–117)
BUN/Creatinine Ratio: 15 (ref 9–23)
BUN: 12 mg/dL (ref 6–24)
Bilirubin Total: 0.4 mg/dL (ref 0.0–1.2)
CO2: 26 mmol/L (ref 20–29)
Calcium: 9.7 mg/dL (ref 8.7–10.2)
Chloride: 107 mmol/L — ABNORMAL HIGH (ref 96–106)
Creatinine, Ser: 0.78 mg/dL (ref 0.57–1.00)
GFR calc Af Amer: 100 mL/min/{1.73_m2} (ref >59)
GFR calc non Af Amer: 87 mL/min/{1.73_m2} (ref >59)
Globulin, Total: 1.8 g/dL (ref 1.5–4.5)
Glucose: 77 mg/dL (ref 65–99)
Potassium: 4.3 mmol/L (ref 3.5–5.2)
Sodium: 146 mmol/L — ABNORMAL HIGH (ref 134–144)
Total Protein: 6.1 g/dL (ref 6.0–8.5)

## 2017-06-20 NOTE — Progress Notes (Signed)
I have reviewed and agreed above plan. 

## 2017-06-20 NOTE — Telephone Encounter (Signed)
Spoke with patient and informed her that her labs are stable.  She verbalized understanding, appreciation.  

## 2017-07-31 ENCOUNTER — Other Ambulatory Visit: Payer: Self-pay | Admitting: Neurology

## 2017-08-05 ENCOUNTER — Telehealth: Payer: Self-pay

## 2017-08-05 DIAGNOSIS — B009 Herpesviral infection, unspecified: Secondary | ICD-10-CM

## 2017-08-05 MED ORDER — VALACYCLOVIR HCL 500 MG PO TABS
500.0000 mg | ORAL_TABLET | Freq: Every day | ORAL | 0 refills | Status: DC
Start: 2017-08-05 — End: 2017-11-04

## 2017-08-05 NOTE — Telephone Encounter (Signed)
Refill for valtrex sent into pharmacy. Thanks!

## 2017-08-15 ENCOUNTER — Encounter: Payer: Self-pay | Admitting: Family Medicine

## 2017-08-15 ENCOUNTER — Ambulatory Visit (INDEPENDENT_AMBULATORY_CARE_PROVIDER_SITE_OTHER): Payer: Medicaid Other | Admitting: Family Medicine

## 2017-08-15 VITALS — BP 130/73 | HR 79 | Temp 99.0°F | Resp 16 | Ht 64.0 in | Wt 139.0 lb

## 2017-08-15 DIAGNOSIS — G35 Multiple sclerosis: Secondary | ICD-10-CM | POA: Diagnosis not present

## 2017-08-15 DIAGNOSIS — E785 Hyperlipidemia, unspecified: Secondary | ICD-10-CM

## 2017-08-15 MED ORDER — OMEGA-3-ACID ETHYL ESTERS 1 G PO CAPS: 1.0000 g | ORAL_CAPSULE | Freq: Every day | ORAL | 5 refills | Status: DC

## 2017-08-15 NOTE — Patient Instructions (Signed)
Cholesterol Cholesterol is a fat. Your body needs a small amount of cholesterol. Cholesterol (plaque) may build up in your blood vessels (arteries). That makes you more likely to have a heart attack or stroke. You cannot feel your cholesterol level. Having a blood test is the only way to find out if your level is high. Keep your test results. Work with your doctor to keep your cholesterol at a good level. What do the results mean?  Total cholesterol is how much cholesterol is in your blood.  LDL is bad cholesterol. This is the type that can build up. Try to have low LDL.  HDL is good cholesterol. It cleans your blood vessels and carries LDL away. Try to have high HDL.  Triglycerides are fat that the body can store or burn for energy. What are good levels of cholesterol?  Total cholesterol below 200.  LDL below 100 is good for people who have health risks. LDL below 70 is good for people who have very high risks.  HDL above 40 is good. It is best to have HDL of 60 or higher.  Triglycerides below 150. How can I lower my cholesterol? Diet Follow your diet program as told by your doctor.  Choose fish, white meat chicken, or [place] that is roasted or baked. Try not to eat red meat, fried foods, sausage, or lunch meats.  Eat lots of fresh fruits and vegetables.  Choose whole grains, beans, pasta, potatoes, and cereals.  Choose olive oil, corn oil, or canola oil. Only use small amounts.  Try not to eat butter, mayonnaise, shortening, or palm kernel oils.  Try not to eat foods with trans fats.  Choose low-fat or nonfat dairy foods. ? Drink skim or nonfat milk. ? Eat low-fat or nonfat yogurt and cheeses. ? Try not to drink whole milk or cream. ? Try not to eat ice cream, egg yolks, or full-fat cheeses.  Healthy desserts include angel food cake, ginger snaps, animal crackers, hard candy, popsicles, and low-fat or nonfat frozen yogurt. Try not to eat pastries, cakes, pies, and  cookies.  Exercise Follow your exercise program as told by your doctor.  Be more active. Try gardening, walking, and taking the stairs.  Ask your doctor about ways that you can be more active.  Medicine  Take over-the-counter and prescription medicines only as told by your doctor. This information is not intended to replace advice given to you by your health care provider. Make sure you discuss any questions you have with your health care provider. Document Released: 04/06/2008 Document Revised: 08/10/2015 Document Reviewed: 07/21/2015 Elsevier Interactive Patient Education  [employer].     Why follow it? Research shows. . Those who follow the Mediterranean diet have a reduced risk of heart disease  . The diet is associated with a reduced incidence of Parkinson's and Alzheimer's diseases . People following the diet may have longer life expectancies and lower rates of chronic diseases  . The Dietary Guidelines for Americans recommends the Mediterranean diet as an eating plan to promote health and prevent disease  What Is the Mediterranean Diet?  . Healthy eating plan based on typical foods and recipes of Mediterranean-style cooking . The diet is primarily a plant based diet; these foods should make up a majority of meals   Starches - Plant based foods should make up a majority of meals - They are an important sources of vitamins, minerals, energy, antioxidants, and fiber - Choose whole grains, foods high in  fiber and minimally processed items  - Typical grain sources include wheat, oats, barley, corn, brown rice, bulgar, farro, millet, polenta, couscous  - Various types of beans include chickpeas, lentils, fava beans, black beans, white beans   Fruits  Veggies - Large quantities of antioxidant rich fruits & veggies; 6 or more servings  - Vegetables can be eaten raw or lightly drizzled with oil and cooked  - Vegetables common to the traditional Mediterranean Diet include:  artichokes, arugula, beets, broccoli, brussel sprouts, cabbage, carrots, celery, collard greens, cucumbers, eggplant, kale, leeks, lemons, lettuce, mushrooms, okra, onions, peas, peppers, potatoes, pumpkin, radishes, rutabaga, shallots, spinach, sweet potatoes, turnips, zucchini - Fruits common to the Mediterranean Diet include: apples, apricots, avocados, cherries, clementines, dates, figs, grapefruits, grapes, melons, nectarines, oranges, peaches, pears, pomegranates, strawberries, tangerines  Fats - Replace butter and margarine with healthy oils, such as olive oil, canola oil, and tahini  - Limit nuts to no more than a handful a day  - Nuts include walnuts, almonds, pecans, pistachios, pine nuts  - Limit or avoid candied, honey roasted or heavily salted nuts - Olives are central to the [employer] - can be eaten whole or used in a variety of dishes   Meats Protein - Limiting red meat: no more than a few times a month - When eating red meat: choose lean cuts and keep the portion to the size of deck of cards - Eggs: approx. 0 to 4 times a week  - Fish and lean poultry: at least 2 a week  - Healthy protein sources include, chicken, [place], lean beef, lamb - Increase intake of seafood such as tuna, salmon, trout, mackerel, shrimp, scallops - Avoid or limit high fat processed meats such as sausage and bacon  Dairy - Include moderate amounts of low fat dairy products  - Focus on healthy dairy such as fat free yogurt, skim milk, low or reduced fat cheese - Limit dairy products higher in fat such as whole or 2% milk, cheese, ice cream  Alcohol - Moderate amounts of red wine is ok  - No more than 5 oz daily for women (all ages) and men older than age 2  - No more than 10 oz of wine daily for men younger than 14  Other - Limit sweets and other desserts  - Use herbs and spices instead of salt to flavor foods  - Herbs and spices common to the traditional Mediterranean Diet include: basil, bay  leaves, chives, cloves, cumin, fennel, garlic, lavender, marjoram, mint, oregano, parsley, pepper, rosemary, sage, savory, sumac, tarragon, thyme   It's not just a diet, it's a lifestyle:  . The Mediterranean diet includes lifestyle factors typical of those in the region  . Foods, drinks and meals are best eaten with others and savored . Daily physical activity is important for overall good health . This could be strenuous exercise like running and aerobics . This could also be more leisurely activities such as walking, housework, yard-work, or taking the stairs . Moderation is the key; a balanced and healthy diet accommodates most foods and drinks . Consider portion sizes and frequency of consumption of certain foods   Meal Ideas & Options:  . Breakfast:  o Whole wheat toast or whole wheat English muffins with peanut butter & hard boiled egg o Steel cut oats topped with apples & cinnamon and skim milk  o Fresh fruit: banana, strawberries, melon, berries, peaches  o Smoothies: strawberries, bananas, greek yogurt, peanut butter o Low  fat greek yogurt with blueberries and granola  o Egg white omelet with spinach and mushrooms o Breakfast couscous: whole wheat couscous, apricots, skim milk, cranberries  . Sandwiches:  o Hummus and grilled vegetables (peppers, zucchini, squash) on whole wheat bread   o Grilled chicken on whole wheat pita with lettuce, tomatoes, cucumbers or tzatziki  o Tuna salad on whole wheat bread: tuna salad made with greek yogurt, olives, red peppers, capers, green onions o Garlic rosemary lamb pita: lamb sauted with garlic, rosemary, salt & pepper; add lettuce, cucumber, greek yogurt to pita - flavor with lemon juice and black pepper  . Seafood:  o Mediterranean grilled salmon, seasoned with garlic, basil, parsley, lemon juice and black pepper o Shrimp, lemon, and spinach whole-grain pasta salad made with low fat greek yogurt  o Seared scallops with lemon orzo   o Seared tuna steaks seasoned salt, pepper, coriander topped with tomato mixture of olives, tomatoes, olive oil, minced garlic, parsley, green onions and cappers  . Meats:  o Herbed greek chicken salad with kalamata olives, cucumber, feta  o Red bell peppers stuffed with spinach, bulgur, lean ground beef (or lentils) & topped with feta   o Kebabs: skewers of chicken, tomatoes, onions, zucchini, squash  o [place] burgers: made with red onions, mint, dill, lemon juice, feta cheese topped with roasted red peppers . Vegetarian o Cucumber salad: cucumbers, artichoke hearts, celery, red onion, feta cheese, tossed in olive oil & lemon juice  o Hummus and whole grain pita points with a greek salad (lettuce, tomato, feta, olives, cucumbers, red onion) o Lentil soup with celery, carrots made with vegetable broth, garlic, salt and pepper  o Tabouli salad: parsley, bulgur, mint, scallions, cucumbers, tomato, radishes, lemon juice, olive oil, salt and pepper.      Fat and Cholesterol Restricted Diet High levels of fat and cholesterol in your blood may lead to various health problems, such as diseases of the heart, blood vessels, gallbladder, liver, and pancreas. Fats are concentrated sources of energy that come in various forms. Certain types of fat, including saturated fat, may be harmful in excess. Cholesterol is a substance needed by your body in small amounts. Your body makes all the cholesterol it needs. Excess cholesterol comes from the food you eat. When you have high levels of cholesterol and saturated fat in your blood, health problems can develop because the excess fat and cholesterol will gather along the walls of your blood vessels, causing them to narrow. Choosing the right foods will help you control your intake of fat and cholesterol. This will help keep the levels of these substances in your blood within normal limits and reduce your risk of disease. What is my plan? Your health care provider  recommends that you:  Limit your fat intake to ______% or less of your total calories per day.  Limit the amount of cholesterol in your diet to less than _________mg per day.  Eat 20-30 grams of fiber each day.  What types of fat should I choose?  Choose healthy fats more often. Choose monounsaturated and polyunsaturated fats, such as olive and canola oil, flaxseeds, walnuts, almonds, and seeds.  Eat more omega-3 fats. Good choices include salmon, mackerel, sardines, tuna, flaxseed oil, and ground flaxseeds. Aim to eat fish at least two times a week.  Limit saturated fats. Saturated fats are primarily found in animal products, such as meats, butter, and cream. Plant sources of saturated fats include palm oil, palm kernel oil, and coconut oil.  Avoid foods with partially hydrogenated oils in them. These contain trans fats. Examples of foods that contain trans fats are stick margarine, some tub margarines, cookies, crackers, and other baked goods. What general guidelines do I need to follow? These guidelines for healthy eating will help you control your intake of fat and cholesterol:  Check food labels carefully to identify foods with trans fats or high amounts of saturated fat.  Fill one half of your plate with vegetables and green salads.  Fill one fourth of your plate with whole grains. Look for the word "whole" as the first word in the ingredient list.  Fill one fourth of your plate with lean protein foods.  Limit fruit to two servings a day. Choose fruit instead of juice.  Eat more foods that contain fiber, such as apples, broccoli, carrots, beans, peas, and barley.  Eat more home-cooked food and less restaurant, buffet, and fast food.  Limit or avoid alcohol.  Limit foods high in starch and sugar.  Limit fried foods.  Cook foods using methods other than frying. Baking, boiling, grilling, and broiling are all great options.  Lose weight if you are overweight. Losing just  5-10% of your initial body weight can help your overall health and prevent diseases such as diabetes and heart disease.  What foods can I eat? Grains  Whole grains, such as whole wheat or whole grain breads, crackers, cereals, and pasta. Unsweetened oatmeal, bulgur, barley, quinoa, or brown rice. Corn or whole wheat flour tortillas. Vegetables  Fresh or frozen vegetables (raw, steamed, roasted, or grilled). Green salads. Fruits  All fresh, canned (in natural juice), or frozen fruits. Meats and other protein foods  Ground beef (85% or leaner), grass-fed beef, or beef trimmed of fat. Skinless chicken or [place]. Ground chicken or [place]. Pork trimmed of fat. All fish and seafood. Eggs. Dried beans, peas, or lentils. Unsalted nuts or seeds. Unsalted canned or dry beans. Dairy  Low-fat dairy products, such as skim or 1% milk, 2% or reduced-fat cheeses, low-fat ricotta or cottage cheese, or plain low-fat yo Fats and oils  Tub margarines without trans fats. Light or reduced-fat mayonnaise and salad dressings. Avocado. Olive, canola, sesame, or safflower oils. Natural peanut or almond butter (choose ones without added sugar and oil). The items listed above may not be a complete list of recommended foods or beverages. Contact your dietitian for more options. Foods to avoid Grains  White bread. White pasta. White rice. Cornbread. Bagels, pastries, and croissants. Crackers that contain trans fat. Vegetables  White potatoes. Corn. Creamed or fried vegetables. Vegetables in a cheese sauce. Fruits  Dried fruits. Canned fruit in light or heavy syrup. Fruit juice. Meats and other protein foods  Fatty cuts of meat. Ribs, chicken wings, bacon, sausage, bologna, salami, chitterlings, fatback, hot dogs, bratwurst, and packaged luncheon meats. Liver and organ meats. Dairy  Whole or 2% milk, cream, half-and-half, and cream cheese. Whole milk cheeses. Whole-fat or sweetened yogurt. Full-fat cheeses.  Nondairy creamers and whipped toppings. Processed cheese, cheese spreads, or cheese curds. Beverages  Alcohol. Sweetened drinks (such as sodas, lemonade, and fruit drinks or punches). Fats and oils  Butter, stick margarine, lard, shortening, ghee, or bacon fat. Coconut, palm kernel, or palm oils. Sweets and desserts  Corn syrup, sugars, honey, and molasses. Candy. Jam and jelly. Syrup. Sweetened cereals. Cookies, pies, cakes, donuts, muffins, and ice cream. The items listed above may not be a complete list of foods and beverages to avoid. Contact your dietitian for  more information. This information is not intended to replace advice given to you by your health care provider. Make sure you discuss any questions you have with your health care provider. Document Released: 01/08/2005 Document Revised: 01/29/2014 Document Reviewed: 04/08/2013 Elsevier Interactive Patient Education  [employer].

## 2017-08-15 NOTE — Progress Notes (Signed)
  Subjective:     HPI: Kelly Grant is a 52 y.o. female here for follow up of dyslipidemia. The patient does not use medications that may worsen dyslipidemias (corticosteroids, progestins, anabolic steroids, diuretics, beta-blockers, amiodarone, cyclosporine, olanzapine). The patient exercises occasionally.  Review of Systems: Review of Systems - General ROS: negative Psychological ROS: negative ENT ROS: negative Respiratory ROS: no cough, shortness of breath, or wheezing Cardiovascular ROS: no chest pain or dyspnea on exertion Gastrointestinal ROS: no abdominal pain, change in bowel habits, or black or bloody stools Musculoskeletal ROS: negative Neurological ROS: no TIA or stroke symptoms      Objective:    Vitals:   08/15/17 1102  BP: 130/73  Pulse: 79  Resp: 16  Temp: 99 F (37.2 C)  SpO2: 99%    Physical Exam: General Appearance:  awake, alert, oriented, in no acute distress, well developed, well nourished and in no acute distress Head/face:  NCAT Eyes:  PERRL, EOMI and Conjunctiva- normal Lungs:  Normal expansion.  Clear to auscultation.  No rales, rhonchi, or wheezing. Heart:  Heart sounds are normal.  Regular rate and rhythm without murmur, gallop or rub. Abdomen:  Soft, non-tender, normal bowel sounds; no bruits, organomegaly or masses. Extremities: Extremities warm to touch, pink, with no edema. Musculoskeletal:  negative Peripheral Pulses:  Normal Neurologic:  Alert and oriented x 3, gait normal., reflexes normal and symmetric, strength and  sensation grossly normal Psych exam:alert,oriented, in NAD with a full range of affect, normal behavior and no psychotic features   Lab Review Lab Results  Component Value Date   CHOL 213 (H) 03/18/2017   CHOL 227 (H) 02/17/2016   CHOL 216 (H) 02/17/2015   CHOL 208 (H) 08/09/2014   HDL 71 03/18/2017   HDL 78 02/17/2016   HDL 59 02/17/2015   HDL 59 08/09/2014       Assessment:     Problem List Items  Addressed This Visit      Nervous and Auditory   Multiple sclerosis    Other Visit Diagnoses    Hyperlipidemia, unspecified hyperlipidemia type    -  Primary   Relevant Medications   omega-3 acid ethyl esters (LOVAZA) 1 g capsule          Plan:    The following changes are planned for the next 6 months, at which time the patient will return for repeat fasting lipids:  1. Dietary recommendations: Mediterranean diet guidelines Reduce saturated fat, "trans" monounsaturated fatty acids, and cholesterol Low sodium diet Increase soluble fiber 2. Exercise recommendations:  An average 40 minutes of moderate to vigorous-intensity aerobic activity 3 or 4 times per week 3. Other treatment: None  Return in 6 months (on 02/15/2018).

## 2017-08-16 ENCOUNTER — Other Ambulatory Visit: Payer: Medicaid Other

## 2017-08-16 ENCOUNTER — Other Ambulatory Visit: Payer: Self-pay | Admitting: Family Medicine

## 2017-08-16 DIAGNOSIS — E785 Hyperlipidemia, unspecified: Secondary | ICD-10-CM

## 2017-08-16 NOTE — Progress Notes (Signed)
Lab orders

## 2017-08-17 LAB — CBC WITH DIFFERENTIAL
Basophils Absolute: 0 10*3/uL (ref 0.0–0.2)
Basos: 0 %
EOS (ABSOLUTE): 0 10*3/uL (ref 0.0–0.4)
Eos: 1 %
Hematocrit: 40.9 % (ref 34.0–46.6)
Hemoglobin: 13.5 g/dL (ref 11.1–15.9)
Immature Grans (Abs): 0 10*3/uL (ref 0.0–0.1)
Immature Granulocytes: 0 %
Lymphocytes Absolute: 0.7 10*3/uL (ref 0.7–3.1)
Lymphs: 17 %
MCH: 33.3 pg — ABNORMAL HIGH (ref 26.6–33.0)
MCHC: 33 g/dL (ref 31.5–35.7)
MCV: 101 fL — ABNORMAL HIGH (ref 79–97)
Monocytes Absolute: 0.5 10*3/uL (ref 0.1–0.9)
Monocytes: 12 %
Neutrophils Absolute: 2.9 10*3/uL (ref 1.4–7.0)
Neutrophils: 70 %
RBC: 4.06 x10E6/uL (ref 3.77–5.28)
RDW: 14.2 % (ref 12.3–15.4)
WBC: 4 10*3/uL (ref 3.4–10.8)

## 2017-08-17 LAB — LIPID PANEL
Chol/HDL Ratio: 3.6 ratio (ref 0.0–4.4)
Cholesterol, Total: 236 mg/dL — ABNORMAL HIGH (ref 100–199)
HDL: 66 mg/dL (ref >39)
LDL Calculated: 150 mg/dL — ABNORMAL HIGH (ref 0–99)
Triglycerides: 99 mg/dL (ref 0–149)
VLDL Cholesterol Cal: 20 mg/dL (ref 5–40)

## 2017-08-17 LAB — COMPREHENSIVE METABOLIC PANEL
ALT: 34 IU/L — ABNORMAL HIGH (ref 0–32)
AST: 34 IU/L (ref 0–40)
Albumin/Globulin Ratio: 2 (ref 1.2–2.2)
Albumin: 4.1 g/dL (ref 3.5–5.5)
Alkaline Phosphatase: 66 IU/L (ref 39–117)
BUN/Creatinine Ratio: 13 (ref 9–23)
BUN: 10 mg/dL (ref 6–24)
Bilirubin Total: 0.5 mg/dL (ref 0.0–1.2)
CO2: 24 mmol/L (ref 20–29)
Calcium: 9.5 mg/dL (ref 8.7–10.2)
Chloride: 107 mmol/L — ABNORMAL HIGH (ref 96–106)
Creatinine, Ser: 0.75 mg/dL (ref 0.57–1.00)
GFR calc Af Amer: 105 mL/min/{1.73_m2} (ref >59)
GFR calc non Af Amer: 91 mL/min/{1.73_m2} (ref >59)
Globulin, Total: 2.1 g/dL (ref 1.5–4.5)
Glucose: 88 mg/dL (ref 65–99)
Potassium: 4.2 mmol/L (ref 3.5–5.2)
Sodium: 145 mmol/L — ABNORMAL HIGH (ref 134–144)
Total Protein: 6.2 g/dL (ref 6.0–8.5)

## 2017-10-04 ENCOUNTER — Telehealth: Payer: Self-pay | Admitting: *Deleted

## 2017-10-04 NOTE — Telephone Encounter (Signed)
Received fax notification from [place] that rx Gilenya shipped to pt on 10/01/2017.

## 2017-10-28 ENCOUNTER — Encounter: Payer: Self-pay | Admitting: Family Medicine

## 2017-10-28 DIAGNOSIS — Z1231 Encounter for screening mammogram for malignant neoplasm of breast: Secondary | ICD-10-CM | POA: Diagnosis not present

## 2017-11-01 ENCOUNTER — Telehealth: Payer: Self-pay

## 2017-11-01 DIAGNOSIS — B009 Herpesviral infection, unspecified: Secondary | ICD-10-CM

## 2017-11-04 MED ORDER — VALACYCLOVIR HCL 500 MG PO TABS: 500.0000 mg | ORAL_TABLET | Freq: Every day | ORAL | 0 refills | Status: DC

## 2017-11-04 NOTE — Telephone Encounter (Signed)
REFILL SENT INTO PHARMACY. THANKS!

## 2017-11-23 DIAGNOSIS — Z23 Encounter for immunization: Secondary | ICD-10-CM | POA: Diagnosis not present

## 2017-12-31 NOTE — Progress Notes (Signed)
Minutes  [facility]  PATIENT: Kelly Grant DOB: [birth year]   REASON FOR VISIT: Follow-up for multiple sclerosis, relapsing remitting HISTORY FROM: Patient alone at visit    [place] 12/11/2019CM Kelly Grant, 52 year old female returns for follow-up with relapsing remitting multiple sclerosis.  Last MRI of the brain June 2018 with chronic demyelinating plaques none of these were acute and there was no interval change.  She is currently on Gilenya tolerating the medication without side effects.  She denies any visual disturbance.  She denies any swallowing problems.  Appetite is good she is sleeping well.  She denies bowel or bladder issues.  She continues to drive.  She continues to work part-time.  She returns for reevaluation   UPDATE 5/29/2019CM Kelly Grant, 52 year old female returns for follow-up with a history of relapsing remitting multiple sclerosis.  She is currently on Gilenya tolerating the medication without side effect.  MRI of the brain June 2018 with chronic demyelinating plaques associated with multiple sclerosis none of these were acute, there is no interval change.  She she denies relation of Kelly symptoms.  No visual changes , swallowing problems, bowel or bladder issues.  She denies any falls.  She continues to drive without difficulty.  She returns for reevaluation.  She still works part-time.  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 None of the foci appears to be acute. When compared to the MRI dated 11/27/2012, there is no interval  change.    UPDATE 05/30/2018CM Kelly Grant, 52 year old female returns for follow-up with a history of relapsing remitting multiple sclerosis. Her initial symptoms were optic neuritis in the 80s. Last MRI of the brain performed on 11/27/2012 was abnormal showing extensive bilateral subcortical and ventricular white matter hyperintensities compatible with chronic demyelinating plaques. No enhancing lesions are seen, presence of atrophy of the corpus colossal and multiple T1 black holes indicate chronic disease. She is currently on Gilenya without side effects and no exacerbation of her symptoms. She denies any visual changes each or swallowing problems new  weakness bowel or bladder issues, or muscle spasms. She has not had any falls. She was recently placed on fish oil  for elevated cholesterol . She returns for reevaluation   12/22/15 CM Kelly Grant,51 year old female returns for followup. She was last seen in this office in 06/21/15  She has a history of multiple sclerosis relapsing remitting. Initial symptoms were right optic neuritis in the 1980s then in May 2006 a left optic neuritis. MRI of the brain at that time showed typical Kelly lesions. She is currently on Gilenya tolerating the medication without difficulty. Today she denies spasms, focal weakness, sensory changes, visual changes, speech or swallowing problems, no problems with bowel or bladder function. No exacerbation since last seen, and no new neurologic complaints. Most recent MRI of the brain performed 11/27/2012 was abnormal showing extensive bilateral subcortical and ventricular white matter hyperintensities compatible with chronic demyelinating plaques. No enhancing lesions are seen, presence of atrophy of the corpus colossal and multiple T1 black holes indicate chronic disease. Compared to the MRI July 2006 white matter disease burden is slightly progressed while atrophy and T1 black  holes are much more advanced. She denies any recent falls.  She is looking for work . She continues to drive without difficulty. She denies any memory issues. She returns for reevaluation.   REVIEW OF SYSTEMS: Full 14 system review of systems performed and notable only for those listed, all others are neg:  Constitutional: neg  Cardiovascular: neg Ear/Nose/Throat: neg  Skin: neg Eyes: neg Respiratory: neg Gastroitestinal: neg  Hematology/Lymphatic: neg  Endocrine: neg Musculoskeletal:neg Allergy/Immunology: neg Neurological: neg Psychiatric: neg Sleep : neg   ALLERGIES: Allergies  Allergen Reactions  . Macrobid [Nitrofurantoin Macrocrystal] Hives    HOME MEDICATIONS: Outpatient Medications Prior to Visit  Medication Sig Dispense Refill  . aspirin EC 81 MG tablet Take 1 tablet (81 mg total) by mouth daily. 30 tablet 11  . calcium-vitamin D (OSCAL WITH D) 500-200 MG-UNIT tablet Take 2 tablets by mouth.     . carboxymethylcellulose (REFRESH PLUS) 0.5 % SOLN 1 drop daily as needed.    . CycloSPORINE (RESTASIS OP) Apply to eye.    . diclofenac sodium (VOLTAREN) 1 % GEL Apply topically as needed.    . Fingolimod HCl (GILENYA) 0.5 MG CAPS TAKE ONE CAPSULE BY MOUTH ONCE DAILY. MAY TAKE WITH OR WITHOUT FOOD. STORE AT ROOM TEMPERATURE. 90 capsule 1  . Multiple Vitamin (MULTIVITAMIN) tablet Take 1 tablet by mouth daily. 30 tablet 3  . omega-3 acid ethyl esters (LOVAZA) 1 g capsule Take 1 capsule (1 g total) by mouth daily. 30 capsule 5  . valACYclovir (VALTREX) 500 MG tablet Take 1 tablet (500 mg total) by mouth daily. 90 tablet 0  . Wheat Dextrin (BENEFIBER DRINK MIX PO) Take by mouth. 2 tsp TID     No facility-administered medications prior to visit.     PAST MEDICAL HISTORY: Past Medical History:  Diagnosis Date  . Abnormal Pap smear    ASC-H  . Chlamydia   . History of chicken pox   . HSV infection   . Multiple sclerosis ([place])   . Neuromuscular disorder ([place])    Kelly  . Yeast infection     PAST SURGICAL HISTORY: Past Surgical  History:  Procedure Laterality Date  . CARPAL TUNNEL RELEASE    . CESAREAN SECTION     x2  . INCISION AND DRAINAGE ABSCESS     left buttock  . TUBAL LIGATION      FAMILY HISTORY: Family History  Problem Relation Age of Onset  . Kidney failure Mother   . Diabetes Mother   . Colon cancer Neg Hx     SOCIAL HISTORY: Social History   Socioeconomic History  . Marital status: Single    Spouse name: Not on file  . Number of children: 2  . Years of education: College   . Highest education level: Not on file  Occupational History  . Occupation: HOUSEKEEPING    Employer: [employer]  . Financial resource strain: Not on file  . Food insecurity:    Worry: Not on file    Inability: Not on file  . Transportation needs:    Medical: Not on file    Non-medical: Not on file  Tobacco Use  . Smoking status: Former Smoker    Last attempt to quit: 01/23/2000    Years since quitting: 17.9  . Smokeless tobacco: Never Used  Substance and Sexual Activity  . Alcohol use: Yes    Alcohol/week: 0.0 standard drinks    Comment: occasional  . Drug use: No  .  Sexual activity: Yes    Partners: Male    Birth control/protection: None, Surgical  Lifestyle  . Physical activity:    Days per week: Not on file    Minutes per session: Not on file  . Stress: Not on file  Relationships  . Social connections:    Talks on phone: Not on file    Gets together: Not on file    Attends religious service: Not on file    Active member of club or organization: Not on file    Attends meetings of clubs or organizations: Not on file    Relationship status: Not on file  . Intimate partner violence:    Fear of current or ex partner: Not on file    Emotionally abused: Not on file    Physically abused: Not on file    Forced sexual activity: Not on file  Other Topics Concern  . Not on file  Social History Narrative   Patient lives at home with children.    Patient has 2 children.    Patient  has a college degree.    Patient is single.    Patient is right handed.    Patient is currently employed.      PHYSICAL EXAM  Vitals:   01/01/18 1032  BP: 122/64  Pulse: 65  Weight: 136 lb (61.7 kg)  Height: 5\' 4"  (1.626 m)   Body mass index is 23.34 kg/m. Generalized: Well developed, in no acute distress ,Well-groomed Head: normocephalic and atraumatic,. Oropharynx benign  Neck: Supple,  Cardiac: Regular rate rhythm, no murmur  Musculoskeletal: No deformity  Neurological examination  Mentation: Alert oriented to time, place, history taking. Follows all commands speech and language fluent  Cranial nerve II-XII: Visual acuity 20/20 bilateral Pupils were equal round reactive to light extraocular movements were full, visual field were full on confrontational test. Facial sensation and strength were normal. hearing was intact to finger rubbing bilaterally. Uvula tongue midline. head turning and shoulder shrug were normal and symmetric.Tongue protrusion into cheek strength was normal.  Motor: normal bulk and tone, full strength in the BUE, BLE, fine finger movements normal,  Sensory: normal and symmetric to light touch, pinprick and vibratory in the upper and lower extremities Coordination: finger-nose-finger, heel-to-shin bilaterally, no dysmetria  Reflexes: Symmetric upper and lower plantar responses were flexor bilaterally.  Gait and Station: Rising up from seated position without assistance, normal stance, moderate stride, good arm swing, smooth turning, able to perform tiptoe, and heel walking without difficulty. Tandem gait is mildly unsteady. No assistive device   DIAGNOSTIC DATA (LABS, IMAGING, TESTING) - I reviewed patient records, labs, notes, testing and imaging myself where available.  Lab Results  Component Value Date   WBC 4.0 08/16/2017   HGB 13.5 08/16/2017   HCT 40.9 08/16/2017   MCV 101 (H) 08/16/2017   PLT 272 06/19/2017      Component Value  Date/Time   NA 145 (H) 08/16/2017 0943   K 4.2 08/16/2017 0943   CL 107 (H) 08/16/2017 0943   CO2 24 08/16/2017 0943   GLUCOSE 88 08/16/2017 0943   GLUCOSE 80 02/17/2015 1353   BUN 10 08/16/2017 0943   CREATININE 0.75 08/16/2017 0943   CREATININE 0.77 02/17/2015 1353   CALCIUM 9.5 08/16/2017 0943   PROT 6.2 08/16/2017 0943   ALBUMIN 4.1 08/16/2017 0943   AST 34 08/16/2017 0943   ALT 34 (H) 08/16/2017 0943   ALKPHOS 66 08/16/2017 0943   BILITOT 0.5 08/16/2017 1275  GFRNONAA 91 08/16/2017 0943   GFRNONAA >89 02/17/2015 1353   GFRAA 105 08/16/2017 0943   GFRAA >89 02/17/2015 1353   Lab Results  Component Value Date   CHOL 236 (H) 08/16/2017   HDL 66 08/16/2017   LDLCALC 150 (H) 08/16/2017   TRIG 99 08/16/2017   CHOLHDL 3.6 08/16/2017   Lab Results  Component Value Date   HGBA1C 5.2 02/17/2016    ASSESSMENT AND PLAN 52 y.o. year old female has a past medical history of Multiple sclerosis ([place]); and Neuromuscular disorder ([place]). here to follow-up. She has not had an exacerbation of her Kelly symptoms since last seen. She is currently on Gilenya without side effects. Repeat MRI of the brain 07/01/16 showed no significant change, clinically she is stable  PLAN: Will obtain CBC, CMP  today to monitor for adverse effects of Gilenya Continue Gilenya at current dose, will refill Exercise by walking for overall health and well-being Stay well-hydrated We will repeat MRI of the brain at the next visit  follow-up in 6 months,  Dennie Bible, [facility], [facility], [facility] [address], [place], [place] [address] [phone]

## 2018-01-01 ENCOUNTER — Ambulatory Visit: Payer: Medicaid Other | Admitting: Nurse Practitioner

## 2018-01-01 ENCOUNTER — Encounter: Payer: Self-pay | Admitting: Nurse Practitioner

## 2018-01-01 VITALS — BP 122/64 | HR 65 | Ht 64.0 in | Wt 136.0 lb

## 2018-01-01 DIAGNOSIS — Z5181 Encounter for therapeutic drug level monitoring: Secondary | ICD-10-CM | POA: Diagnosis not present

## 2018-01-01 DIAGNOSIS — G35 Multiple sclerosis: Secondary | ICD-10-CM

## 2018-01-01 MED ORDER — FINGOLIMOD HCL 0.5 MG PO CAPS: ORAL_CAPSULE | ORAL | 1 refills | Status: DC

## 2018-01-01 NOTE — Patient Instructions (Signed)
Will obtain CBC, CMP  today to monitor for adverse effects of Gilenya Continue Gilenya at current dose, will refill Repeat MRI of the brain 07/01/16 showed no significant change, clinically she is stable Exercise by walking for overall health and well-being Stay well-hydrated Follow-up in 6 months,

## 2018-01-02 LAB — COMPREHENSIVE METABOLIC PANEL
ALT: 18 IU/L (ref 0–32)
AST: 27 IU/L (ref 0–40)
Albumin/Globulin Ratio: 2.4 — ABNORMAL HIGH (ref 1.2–2.2)
Albumin: 4.3 g/dL (ref 3.5–5.5)
Alkaline Phosphatase: 65 IU/L (ref 39–117)
BUN/Creatinine Ratio: 15 (ref 9–23)
BUN: 11 mg/dL (ref 6–24)
Bilirubin Total: 0.4 mg/dL (ref 0.0–1.2)
CO2: 26 mmol/L (ref 20–29)
Calcium: 9.5 mg/dL (ref 8.7–10.2)
Chloride: 106 mmol/L (ref 96–106)
Creatinine, Ser: 0.73 mg/dL (ref 0.57–1.00)
GFR calc Af Amer: 109 mL/min/{1.73_m2} (ref >59)
GFR calc non Af Amer: 94 mL/min/{1.73_m2} (ref >59)
Globulin, Total: 1.8 g/dL (ref 1.5–4.5)
Glucose: 82 mg/dL (ref 65–99)
Potassium: 4.5 mmol/L (ref 3.5–5.2)
Sodium: 146 mmol/L — ABNORMAL HIGH (ref 134–144)
Total Protein: 6.1 g/dL (ref 6.0–8.5)

## 2018-01-02 LAB — CBC WITH DIFFERENTIAL/PLATELET
Basophils Absolute: 0 10*3/uL (ref 0.0–0.2)
Basos: 0 %
EOS (ABSOLUTE): 0 10*3/uL (ref 0.0–0.4)
Eos: 0 %
Hematocrit: 36.8 % (ref 34.0–46.6)
Hemoglobin: 12.7 g/dL (ref 11.1–15.9)
Immature Grans (Abs): 0 10*3/uL (ref 0.0–0.1)
Immature Granulocytes: 0 %
Lymphocytes Absolute: 0.5 10*3/uL — ABNORMAL LOW (ref 0.7–3.1)
Lymphs: 15 %
MCH: 33.7 pg — ABNORMAL HIGH (ref 26.6–33.0)
MCHC: 34.5 g/dL (ref 31.5–35.7)
MCV: 98 fL — ABNORMAL HIGH (ref 79–97)
Monocytes Absolute: 0.3 10*3/uL (ref 0.1–0.9)
Monocytes: 11 %
Neutrophils Absolute: 2.2 10*3/uL (ref 1.4–7.0)
Neutrophils: 74 %
Platelets: 275 10*3/uL (ref 150–450)
RBC: 3.77 x10E6/uL (ref 3.77–5.28)
RDW: 12.6 % (ref 12.3–15.4)
WBC: 3 10*3/uL — ABNORMAL LOW (ref 3.4–10.8)

## 2018-01-03 ENCOUNTER — Telehealth: Payer: Self-pay | Admitting: Neurology

## 2018-01-03 NOTE — Telephone Encounter (Signed)
Please call patient, laboratory evaluation showed decreased WBC 3.0, which is consistent with patient taking Gilenya, rest of the laboratory evaluation showed no significant abnormality.

## 2018-01-03 NOTE — Telephone Encounter (Signed)
Spoke to patient and notified her of the lab results. 

## 2018-01-31 ENCOUNTER — Telehealth: Payer: Self-pay

## 2018-01-31 DIAGNOSIS — B009 Herpesviral infection, unspecified: Secondary | ICD-10-CM

## 2018-01-31 MED ORDER — VALACYCLOVIR HCL 500 MG PO TABS: 500.0000 mg | ORAL_TABLET | Freq: Every day | ORAL | 0 refills | Status: DC

## 2018-01-31 NOTE — Telephone Encounter (Signed)
REFILLS SENT INTO PHARMACY. THANKS!

## 2018-02-17 ENCOUNTER — Other Ambulatory Visit: Payer: Self-pay

## 2018-02-17 ENCOUNTER — Encounter: Payer: Self-pay | Admitting: Family Medicine

## 2018-02-17 ENCOUNTER — Ambulatory Visit (INDEPENDENT_AMBULATORY_CARE_PROVIDER_SITE_OTHER): Payer: Medicaid Other | Admitting: Family Medicine

## 2018-02-17 VITALS — BP 138/86 | HR 58 | Temp 97.7°F | Resp 14 | Ht 64.0 in | Wt 139.0 lb

## 2018-02-17 DIAGNOSIS — R001 Bradycardia, unspecified: Secondary | ICD-10-CM | POA: Diagnosis not present

## 2018-02-17 DIAGNOSIS — E785 Hyperlipidemia, unspecified: Secondary | ICD-10-CM

## 2018-02-17 NOTE — Progress Notes (Signed)
Patient [facility]   Progress Note: General Provider: Lanae Boast, FNP  SUBJECTIVE:   Kelly Grant is a 53 y.o. female who  has a past medical history of Abnormal Pap smear, Chlamydia, History of chicken pox, HSV infection, Multiple sclerosis ([place]), Neuromuscular disorder ([place]), and Yeast infection.. Patient presents today for Hyperlipidemia  Patient presents for follow up on HLD. Patient denies following a heart healthy diet. States that she walks daily for exercise. She states that she has been having intermittent chest pain that is sharp and stabbing. Lasts the entire day. Denies hx of GERD, heartburn, increase belching or flatulence. Chest pain not occurring at the present time. Last EKG 02/21/17-normal with sinus bradycardia.    Review of Systems  Constitutional: Negative.   HENT: Negative.   Eyes: Negative.   Respiratory: Negative.   Cardiovascular: Negative.   Gastrointestinal: Negative.   Genitourinary: Negative.   Musculoskeletal: Negative.   Skin: Negative.   Neurological: Negative.   Psychiatric/Behavioral: Negative.      OBJECTIVE: BP 138/86 (BP Location: Left Arm, Patient Position: Sitting, Cuff Size: Normal)   Pulse (!) 58   Temp 97.7 F (36.5 C) (Oral)   Resp 14   Ht 5\' 4"  (1.626 m)   Wt 139 lb (63 kg)   SpO2 100%   BMI 23.86 kg/m   Wt Readings from Last 3 Encounters:  02/17/18 139 lb (63 kg)  01/01/18 136 lb (61.7 kg)  08/15/17 139 lb (63 kg)     Physical Exam Vitals signs and nursing note reviewed.  Constitutional:      General: She is not in acute distress.    Appearance: She is well-developed.  HENT:     Head: Normocephalic and atraumatic.  Eyes:     Conjunctiva/sclera: Conjunctivae normal.     Pupils: Pupils are equal, round, and reactive to light.  Neck:     Musculoskeletal: Normal range of motion.  Cardiovascular:     Rate and Rhythm: Normal rate and regular rhythm.     Heart sounds: Normal  heart sounds.  Pulmonary:     Effort: Pulmonary effort is normal. No respiratory distress.     Breath sounds: Normal breath sounds.  Abdominal:     General: Bowel sounds are normal. There is no distension.     Palpations: Abdomen is soft.  Musculoskeletal: Normal range of motion.  Skin:    General: Skin is warm and dry.  Neurological:     Mental Status: She is alert and oriented to person, place, and time.  Psychiatric:        Behavior: Behavior normal.        Thought Content: Thought content normal.     ASSESSMENT/PLAN:  1. Hyperlipidemia, unspecified hyperlipidemia type Patient not fasting today. She will return for fasting labs next week. Pending labs. Will adjust medications accordingly.     2. Sinus bradycardia EKG- normal with sinus bradycardia. Instructed patient to keep log of chest pains. Discussed possible causes such as GERD, stress, etc.  No medication changes.    Return in about 6 months (around 08/18/2018) for hld .    The patient was given clear instructions to go to ER or return to medical center if symptoms do not improve, worsen or new problems develop. The patient verbalized understanding and agreed with plan of care.   [name], FNP-BC Patient [facility] [address] [place], [place] [address] [phone]

## 2018-02-17 NOTE — Patient Instructions (Signed)
Cholesterol  Cholesterol is a fat. Your body needs a small amount of cholesterol. Cholesterol (plaque) may build up in your blood vessels (arteries). That makes you more likely to have a heart attack or stroke. You cannot feel your cholesterol level. Having a blood test is the only way to find out if your level is high. Keep your test results. Work with your doctor to keep your cholesterol at a good level. What do the results mean?  Total cholesterol is how much cholesterol is in your blood.  LDL is bad cholesterol. This is the type that can build up. Try to have low LDL.  HDL is good cholesterol. It cleans your blood vessels and carries LDL away. Try to have high HDL.  Triglycerides are fat that the body can store or burn for energy. What are good levels of cholesterol?  Total cholesterol below 200.  LDL below 100 is good for people who have health risks. LDL below 70 is good for people who have very high risks.  HDL above 40 is good. It is best to have HDL of 60 or higher.  Triglycerides below 150. How can I lower my cholesterol? Diet Follow your diet program as told by your doctor.  Choose fish, white meat chicken, or [place] that is roasted or baked. Try not to eat red meat, fried foods, sausage, or lunch meats.  Eat lots of fresh fruits and vegetables.  Choose whole grains, beans, pasta, potatoes, and cereals.  Choose olive oil, corn oil, or canola oil. Only use small amounts.  Try not to eat butter, mayonnaise, shortening, or palm kernel oils.  Try not to eat foods with trans fats.  Choose low-fat or nonfat dairy foods. ? Drink skim or nonfat milk. ? Eat low-fat or nonfat yogurt and cheeses. ? Try not to drink whole milk or cream. ? Try not to eat ice cream, egg yolks, or full-fat cheeses.  Healthy desserts include angel food cake, ginger snaps, animal crackers, hard candy, popsicles, and low-fat or nonfat frozen yogurt. Try not to eat pastries, cakes, pies, and  cookies.  Exercise Follow your exercise program as told by your doctor.  Be more active. Try gardening, walking, and taking the stairs.  Ask your doctor about ways that you can be more active. Medicine  Take over-the-counter and prescription medicines only as told by your doctor. This information is not intended to replace advice given to you by your health care provider. Make sure you discuss any questions you have with your health care provider. Document Released: 04/06/2008 Document Revised: 08/10/2015 Document Reviewed: 07/21/2015 Elsevier Interactive Patient Education  2019 Else[employer] Inc. Bradycardia, Adult Bradycardia is a slower-than-normal heartbeat. A normal resting heart rate for an adult ranges from 60 to 100 beats per minute. With bradycardia, the resting heart rate is less than 60 beats per minute. Bradycardia can prevent enough oxygen from reaching certain areas of your body when you are active. It can be serious if it keeps enough oxygen from reaching your brain and other parts of your body. Bradycardia is not a problem for everyone. For some healthy adults, a slow resting heart rate is normal. What are the causes? This condition may be caused by:  A problem with the heart, including: ? A problem with the heart's electrical system, such as a heart block. With a heart block, electrical signals between the chambers of the heart are partially or completely blocked, so they are not able to work as they should. ? A  problem with the heart's natural pacemaker (sinus node). ? Heart disease. ? A heart attack. ? Heart damage. ? Lyme disease. ? A heart infection. ? A heart condition that is present at birth (congenital heart defect).  Certain medicines that treat heart conditions.  Certain conditions, such as hypothyroidism and obstructive sleep apnea.  Problems with the balance of chemicals and other substances, like potassium, in the blood.  Trauma.  Radiation  therapy. What increases the risk? You are more likely to develop this condition if you:  Are age 54 or older.  Have high blood pressure (hypertension), high cholesterol (hyperlipidemia), or diabetes.  Drink heavily, use tobacco or nicotine products, or use drugs. What are the signs or symptoms? Symptoms of this condition include:  Light-headedness.  Feeling faint or fainting.  Fatigue and weakness.  Trouble with activity or exercise.  Shortness of breath.  Chest pain (angina).  Drowsiness.  Confusion.  Dizziness. How is this diagnosed? This condition may be diagnosed based on:  Your symptoms.  Your medical history.  A physical exam. During the exam, your health care provider will listen to your heartbeat and check your pulse. To confirm the diagnosis, your health care provider may order tests, such as:  Blood tests.  An electrocardiogram (ECG). This test records the heart's electrical activity. The test can show how fast your heart is beating and whether the heartbeat is steady.  A test in which you wear a portable device (event recorder or Holter monitor) to record your heart's electrical activity while you go about your day.  Anexercise test. How is this treated? Treatment for this condition depends on the cause of the condition and how severe your symptoms are. Treatment may involve:  Treatment of the underlying condition.  Changing your medicines or how much medicine you take.  Having a small, battery-operated device called a pacemaker implanted under the skin. When bradycardia occurs, this device can be used to increase your heart rate and help your heart beat in a regular rhythm. Follow these instructions at home: Lifestyle   Manage any health conditions that contribute to bradycardia as told by your health care provider.  Follow a heart-healthy diet. A nutrition specialist (dietitian) can help educate you about healthy food options and  changes.  Follow an exercise program that is approved by your health care provider.  Maintain a healthy weight.  Try to reduce or manage your stress, such as with yoga or meditation. If you need help reducing stress, ask your health care provider.  Do not use any products that contain nicotine or tobacco, such as cigarettes, e-cigarettes, and chewing tobacco. If you need help quitting, ask your health care provider.  Do not use illegal drugs.  Limit alcohol intake to no more than 1 drink a day for nonpregnant women and 2 drinks a day for men. Be aware of how much alcohol is in your drink. In the [place] one drink equals one 12 oz bottle of beer (355 mL), one 5 oz glass of wine (148 mL), or one 1 oz glass of hard liquor (44 mL). General instructions  Take over-the-counter and prescription medicines only as told by your health care provider.  Keep all follow-up visits as told by your health care provider. This is important. How is this prevented? In some cases, bradycardia may be prevented by:  Treating underlying medical problems.  Stopping behaviors or medicines that can trigger the condition. Contact a health care provider if you:  Feel light-headed or dizzy.  Almost faint.  Feel weak or are easily fatigued during physical activity.  Experience confusion or have memory problems. Get help right away if:  You faint.  You have: ? An irregular heartbeat (palpitations). ? Chest pain. ? Trouble breathing. Summary  Bradycardia is a slower-than-normal heartbeat. With bradycardia, the resting heart rate is less than 60 beats per minute.  Treatment for this condition depends on the cause.  Manage any health conditions that contribute to bradycardia as told by your health care provider.  Do not use any products that contain nicotine or tobacco, such as cigarettes, e-cigarettes, and chewing tobacco, and limit alcohol intake.  Keep all follow-up visits as told by your health  care provider. This is important. This information is not intended to replace advice given to you by your health care provider. Make sure you discuss any questions you have with your health care provider. Document Released: 09/30/2001 Document Revised: 06/19/2017 Document Reviewed: 06/19/2017 Elsevier Interactive Patient Education  2019 [employer].

## 2018-02-19 ENCOUNTER — Other Ambulatory Visit: Payer: Medicaid Other

## 2018-02-19 DIAGNOSIS — E785 Hyperlipidemia, unspecified: Secondary | ICD-10-CM

## 2018-02-20 LAB — LIPID PANEL WITH LDL/HDL RATIO
Cholesterol, Total: 231 mg/dL — ABNORMAL HIGH (ref 100–199)
HDL: 73 mg/dL (ref >39)
LDL Calculated: 139 mg/dL — ABNORMAL HIGH (ref 0–99)
LDl/HDL Ratio: 1.9 ratio (ref 0.0–3.2)
Triglycerides: 96 mg/dL (ref 0–149)
VLDL Cholesterol Cal: 19 mg/dL (ref 5–40)

## 2018-02-24 ENCOUNTER — Other Ambulatory Visit: Payer: Self-pay

## 2018-02-24 ENCOUNTER — Ambulatory Visit: Payer: Medicaid Other | Admitting: Obstetrics

## 2018-02-24 ENCOUNTER — Encounter: Payer: Self-pay | Admitting: Obstetrics

## 2018-02-24 ENCOUNTER — Other Ambulatory Visit (HOSPITAL_COMMUNITY)
Admission: RE | Admit: 2018-02-24 | Discharge: 2018-02-24 | Disposition: A | Discharge status: Home / Self Care | Payer: Medicaid Other | Source: Ambulatory Visit | Attending: Obstetrics | Admitting: Obstetrics

## 2018-02-24 VITALS — BP 139/83 | HR 69 | Ht 64.0 in | Wt 138.2 lb

## 2018-02-24 DIAGNOSIS — Z01419 Encounter for gynecological examination (general) (routine) without abnormal findings: Secondary | ICD-10-CM | POA: Diagnosis not present

## 2018-02-24 DIAGNOSIS — Z0000 Encounter for general adult medical examination without abnormal findings: Secondary | ICD-10-CM | POA: Diagnosis not present

## 2018-02-24 DIAGNOSIS — R87810 Cervical high risk human papillomavirus (HPV) DNA test positive: Secondary | ICD-10-CM

## 2018-02-24 DIAGNOSIS — Z780 Asymptomatic menopausal state: Secondary | ICD-10-CM

## 2018-02-24 NOTE — Progress Notes (Signed)
Subjective:        Kelly Grant is a 53 y.o. female here for a routine exam.  Current complaints: None.    Personal health questionnaire:  Is [name], have a family history of breast and/or ovarian cancer: no Is there a family history of uterine cancer diagnosed at age < 57, gastrointestinal cancer, urinary tract cancer, family member who is a Field seismologist syndrome-associated carrier: no Is the patient overweight and hypertensive, family history of diabetes, personal history of gestational diabetes, preeclampsia or PCOS: no Is patient over 43, have PCOS,  family history of premature CHD under age 65, diabetes, smoke, have hypertension or peripheral artery disease:  no At any time, has a partner hit, kicked or otherwise hurt or frightened you?: no Over the past 2 weeks, have you felt down, depressed or hopeless?: no Over the past 2 weeks, have you felt little interest or pleasure in doing things?:no   Gynecologic History No LMP recorded. (Menstrual status: Perimenopausal). Contraception: post menopausal status Last Pap: 2019. Results were: Positive HPV Last mammogram: 2019. Results were: normal  Obstetric History OB History  Gravida Para Term Preterm AB Living  3 2          SAB TAB Ectopic Multiple Live Births               # Outcome Date GA Lbr Len/2nd Weight Sex Delivery Anes PTL Lv  3 Para           2 Para           1 Gravida             Past Medical History:  Diagnosis Date  . Abnormal Pap smear    ASC-H  . Chlamydia   . History of chicken pox   . HSV infection   . Multiple sclerosis ([place])   . Neuromuscular disorder ([place])    MS  . Yeast infection     Past Surgical History:  Procedure Laterality Date  . CARPAL TUNNEL RELEASE    . CESAREAN SECTION     x2  . INCISION AND DRAINAGE ABSCESS     left buttock  . TUBAL LIGATION       Current Outpatient Medications:  .  aspirin EC 81 MG tablet, Take 1 tablet (81 mg total) by mouth daily., Disp: 30  tablet, Rfl: 11 .  calcium-vitamin D (OSCAL WITH D) 500-200 MG-UNIT tablet, Take 2 tablets by mouth. , Disp: , Rfl:  .  carboxymethylcellulose (REFRESH PLUS) 0.5 % SOLN, 1 drop daily as needed., Disp: , Rfl:  .  CycloSPORINE (RESTASIS OP), Apply to eye., Disp: , Rfl:  .  diclofenac sodium (VOLTAREN) 1 % GEL, Apply topically as needed., Disp: , Rfl:  .  Fingolimod HCl (GILENYA) 0.5 MG CAPS, TAKE ONE CAPSULE BY MOUTH ONCE DAILY. MAY TAKE WITH OR WITHOUT FOOD. STORE AT ROOM TEMPERATURE., Disp: 90 capsule, Rfl: 1 .  Multiple Vitamin (MULTIVITAMIN) tablet, Take 1 tablet by mouth daily., Disp: 30 tablet, Rfl: 3 .  omega-3 acid ethyl esters (LOVAZA) 1 g capsule, Take 1 capsule (1 g total) by mouth daily., Disp: 30 capsule, Rfl: 5 .  valACYclovir (VALTREX) 500 MG tablet, Take 1 tablet (500 mg total) by mouth daily., Disp: 90 tablet, Rfl: 0 .  Wheat Dextrin (BENEFIBER DRINK MIX PO), Take by mouth. 2 tsp TID, Disp: , Rfl:  Allergies  Allergen Reactions  . Macrobid [Nitrofurantoin Macrocrystal] Hives    Social History  Tobacco Use  . Smoking status: Former Smoker    Last attempt to quit: 01/23/2000    Years since quitting: 18.1  . Smokeless tobacco: Never Used  Substance Use Topics  . Alcohol use: Yes    Alcohol/week: 0.0 standard drinks    Comment: occasional    Family History  Problem Relation Age of Onset  . Kidney failure Mother   . Diabetes Mother   . Colon cancer Neg Hx       Review of Systems  Constitutional: negative for fatigue and weight loss Respiratory: negative for cough and wheezing Cardiovascular: negative for chest pain, fatigue and palpitations Gastrointestinal: negative for abdominal pain and change in bowel habits Musculoskeletal:negative for myalgias Neurological: negative for gait problems and tremors Behavioral/Psych: negative for abusive relationship, depression Endocrine: negative for temperature intolerance    Genitourinary:negative for abnormal menstrual  periods, genital lesions, hot flashes, sexual problems and vaginal discharge Integument/breast: negative for breast lump, breast tenderness, nipple discharge and skin lesion(s)    Objective:       BP 139/83   Pulse 69   Ht 5\' 4"  (1.626 m)   Wt 138 lb 3.2 oz (62.7 kg)   BMI 23.72 kg/m  General:   alert  Skin:   no rash or abnormalities  Lungs:   clear to auscultation bilaterally  Heart:   regular rate and rhythm, S1, S2 normal, no murmur, click, rub or gallop  Breasts:   normal without suspicious masses, skin or nipple changes or axillary nodes  Abdomen:  normal findings: no organomegaly, soft, non-tender and no hernia  Pelvis:  External genitalia: normal general appearance Urinary system: urethral meatus normal and bladder without fullness, nontender Vaginal: normal without tenderness, induration or masses Cervix: normal appearance Adnexa: normal bimanual exam Uterus: anteverted and non-tender, normal size   Lab Review Urine pregnancy test Labs reviewed yes Radiologic studies reviewed yes  50% of 20 min visit spent on counseling and coordination of care.   Assessment:     1. Encounter for routine gynecological examination with Papanicolaou smear of cervix Rx: - Cytology - PAP( [facility])  2. Postmenopause - doing well  3. Cervical high risk HPV (human papillomavirus) test positive    Plan:    Education reviewed: calcium supplements, depression evaluation, low fat, low cholesterol diet, safe sex/STD prevention, self breast exams, skin cancer screening and weight bearing exercise. Follow up in: 1 year.   No orders of the defined types were placed in this encounter.  No orders of the defined types were placed in this encounter.  Shelly Bombard MD 02-24-2018   Presents for AEX/PAP.  Reports no problems today.

## 2018-02-27 LAB — CYTOLOGY - PAP: Adequacy: ABSENT — AB

## 2018-03-14 ENCOUNTER — Other Ambulatory Visit: Payer: Self-pay | Admitting: Family Medicine

## 2018-03-14 DIAGNOSIS — E785 Hyperlipidemia, unspecified: Secondary | ICD-10-CM

## 2018-03-18 ENCOUNTER — Encounter: Payer: Self-pay | Admitting: Obstetrics

## 2018-03-18 ENCOUNTER — Other Ambulatory Visit: Payer: Self-pay

## 2018-03-18 ENCOUNTER — Ambulatory Visit: Payer: Medicaid Other | Admitting: Obstetrics

## 2018-03-18 ENCOUNTER — Other Ambulatory Visit (HOSPITAL_COMMUNITY)
Admission: RE | Admit: 2018-03-18 | Discharge: 2018-03-18 | Disposition: A | Discharge status: Home / Self Care | Payer: Medicaid Other | Source: Ambulatory Visit | Attending: Obstetrics | Admitting: Obstetrics

## 2018-03-18 VITALS — BP 128/83 | HR 76 | Ht 64.0 in | Wt 141.0 lb

## 2018-03-18 DIAGNOSIS — Z01812 Encounter for preprocedural laboratory examination: Secondary | ICD-10-CM

## 2018-03-18 DIAGNOSIS — N882 Stricture and stenosis of cervix uteri: Secondary | ICD-10-CM

## 2018-03-18 DIAGNOSIS — R87612 Low grade squamous intraepithelial lesion on cytologic smear of cervix (LGSIL): Secondary | ICD-10-CM | POA: Diagnosis not present

## 2018-03-18 DIAGNOSIS — Z3202 Encounter for pregnancy test, result negative: Secondary | ICD-10-CM

## 2018-03-18 DIAGNOSIS — N870 Mild cervical dysplasia: Secondary | ICD-10-CM | POA: Diagnosis not present

## 2018-03-18 DIAGNOSIS — Z780 Asymptomatic menopausal state: Secondary | ICD-10-CM

## 2018-03-18 LAB — POCT URINE PREGNANCY: Preg Test, Ur: NEGATIVE

## 2018-03-18 NOTE — Addendum Note (Signed)
Addended by: Shelly Bombard on: 03/18/2018 01:36 PM   Modules accepted: Orders

## 2018-03-18 NOTE — Progress Notes (Addendum)
Colposcopy Procedure Note  Indications: Pap smear 1 months ago showed: low-grade squamous intraepithelial neoplasia (LGSIL - encompassing HPV,mild dysplasia,CIN I). The prior pap showed NILM with positive HPV.  Prior cervical/vaginal disease: unknown. Prior cervical treatment: no treatment.  Procedure Details  The risks and benefits of the procedure and Written informed consent obtained.  A time-out was performed confirming the patient, procedure and allergy status  Speculum placed in vagina and excellent visualization of cervix achieved, os was stenosed cervix swabbed x 3 with acetic acid solution.  Findings: Cervix: no visible lesions but os was stenosed; Benzocaine 20% spray applied to cervix, cervical biopsies taken at 2, 5 and 10 o'clock, specimen labelled and sent to pathology and hemostasis achieved with silver nitrate.   Vaginal inspection: normal without visible lesions. Vulvar colposcopy: vulvar colposcopy not performed.   Physical Exam           Cervix:  Os stenosed.  Not able to get ECC.   Specimens: Cervical biopsies  Complications: none.  Plan: Specimens labelled and sent to Pathology. Will base further treatment on Pathology findings. Treatment options discussed with patient. Post biopsy instructions given to patient. Return to discuss Pathology results in 2 weeks   Shelly Bombard MD 03-18-2018.

## 2018-03-21 DIAGNOSIS — H16223 Keratoconjunctivitis sicca, not specified as Sjogren's, bilateral: Secondary | ICD-10-CM | POA: Diagnosis not present

## 2018-03-21 DIAGNOSIS — H40033 Anatomical narrow angle, bilateral: Secondary | ICD-10-CM | POA: Diagnosis not present

## 2018-03-25 ENCOUNTER — Telehealth: Payer: Self-pay

## 2018-03-25 DIAGNOSIS — E785 Hyperlipidemia, unspecified: Secondary | ICD-10-CM

## 2018-03-25 NOTE — Telephone Encounter (Signed)
Patient has refills and is aware that script is ready at pharmacy

## 2018-04-01 ENCOUNTER — Encounter: Payer: Self-pay | Admitting: Obstetrics

## 2018-04-01 ENCOUNTER — Ambulatory Visit: Payer: Medicaid Other | Admitting: Obstetrics

## 2018-04-01 VITALS — BP 129/75 | HR 71 | Ht 64.0 in | Wt 139.0 lb

## 2018-04-01 DIAGNOSIS — N870 Mild cervical dysplasia: Secondary | ICD-10-CM

## 2018-04-01 NOTE — Progress Notes (Signed)
Patient ID: Kelly Grant, female   DOB: [birth year], 53 y.o.   MRN: [id number]  No chief complaint on file.   HPI Kelly Grant is a 53 y.o. female.  History of LGSIL pap smear.  Colposcopy with biopsies done.  Presents today for results. HPI  Past Medical History:  Diagnosis Date  . Abnormal Pap smear    ASC-H  . Chlamydia   . History of chicken pox   . HSV infection   . Multiple sclerosis ([place])   . Neuromuscular disorder ([place])    MS  . Yeast infection     Past Surgical History:  Procedure Laterality Date  . CARPAL TUNNEL RELEASE    . CESAREAN SECTION     x2  . INCISION AND DRAINAGE ABSCESS     left buttock  . TUBAL LIGATION      Family History  Problem Relation Age of Onset  . Kidney failure Mother   . Diabetes Mother   . Colon cancer Neg Hx     Social History Social History   Tobacco Use  . Smoking status: Former Smoker    Last attempt to quit: 01/23/2000    Years since quitting: 18.2  . Smokeless tobacco: Never Used  Substance Use Topics  . Alcohol use: Yes    Alcohol/week: 0.0 standard drinks    Comment: occasional  . Drug use: No    Allergies  Allergen Reactions  . Macrobid [Nitrofurantoin Macrocrystal] Hives    Current Outpatient Medications  Medication Sig Dispense Refill  . aspirin EC 81 MG tablet Take 1 tablet (81 mg total) by mouth daily. 30 tablet 11  . calcium-vitamin D (OSCAL WITH D) 500-200 MG-UNIT tablet Take 2 tablets by mouth.     . carboxymethylcellulose (REFRESH PLUS) 0.5 % SOLN 1 drop daily as needed.    . CycloSPORINE (RESTASIS OP) Apply to eye.    . diclofenac sodium (VOLTAREN) 1 % GEL Apply topically as needed.    . Fingolimod HCl (GILENYA) 0.5 MG CAPS TAKE ONE CAPSULE BY MOUTH ONCE DAILY. MAY TAKE WITH OR WITHOUT FOOD. STORE AT ROOM TEMPERATURE. 90 capsule 1  . Multiple Vitamin (MULTIVITAMIN) tablet Take 1 tablet by mouth daily. 30 tablet 3  . omega-3 acid ethyl esters (LOVAZA) 1 g capsule TAKE 1 CAPSULE BY MOUTH EVERY DAY 30  capsule 5  . valACYclovir (VALTREX) 500 MG tablet Take 1 tablet (500 mg total) by mouth daily. 90 tablet 0  . Wheat Dextrin (BENEFIBER DRINK MIX PO) Take by mouth. 2 tsp TID     No current facility-administered medications for this visit.     Review of Systems Review of Systems Constitutional: negative for fatigue and weight loss Respiratory: negative for cough and wheezing Cardiovascular: negative for chest pain, fatigue and palpitations Gastrointestinal: negative for abdominal pain and change in bowel habits Genitourinary:negative Integument/breast: negative for nipple discharge Musculoskeletal:negative for myalgias Neurological: negative for gait problems and tremors Behavioral/Psych: negative for abusive relationship, depression Endocrine: negative for temperature intolerance      Blood pressure 129/75, pulse 71, height 5\' 4"  (1.626 m), weight 139 lb (63 kg).  Physical Exam Physical Exam:  Deferred 50% of 15 min visit spent on counseling and coordination of care.   Data Reviewed Pathology:  LGSIL  Assessment     1. Dysplasia of cervix, low grade (CIN 1) - repeat pap in 1 year    Plan    Follow up in 1 year  No orders of the defined types  were placed in this encounter.  No orders of the defined types were placed in this encounter.   Shelly Bombard MD 04-01-2018

## 2018-04-30 ENCOUNTER — Telehealth: Payer: Self-pay

## 2018-05-01 ENCOUNTER — Other Ambulatory Visit: Payer: Self-pay

## 2018-05-01 DIAGNOSIS — B009 Herpesviral infection, unspecified: Secondary | ICD-10-CM

## 2018-05-01 MED ORDER — VALACYCLOVIR HCL 500 MG PO TABS: 500.0000 mg | ORAL_TABLET | Freq: Every day | ORAL | 0 refills | Status: DC

## 2018-05-01 NOTE — Telephone Encounter (Signed)
Medication sent to pharmacy  

## 2018-07-01 ENCOUNTER — Telehealth: Payer: Self-pay | Admitting: *Deleted

## 2018-07-01 NOTE — Telephone Encounter (Signed)
Due to current COVID 19 pandemic, our office is severely reducing in office visits until further notice, in order to minimize the risk to our patients and healthcare providers.  Pt understands that although there may be some limitations with this type of visit, we will take all precautions to reduce any security or privacy concerns.  Pt understands that this will be treated like an in office visit and we will file with pt's insurance, and there may be a patient responsible charge related to this service. Pt consented to [email or web address] visit.  Email sent to [email or web address].

## 2018-07-03 ENCOUNTER — Other Ambulatory Visit: Payer: Self-pay

## 2018-07-03 ENCOUNTER — Encounter: Payer: Self-pay | Admitting: Neurology

## 2018-07-03 ENCOUNTER — Ambulatory Visit (INDEPENDENT_AMBULATORY_CARE_PROVIDER_SITE_OTHER): Payer: Medicaid Other | Admitting: Neurology

## 2018-07-03 DIAGNOSIS — G35 Multiple sclerosis: Secondary | ICD-10-CM | POA: Diagnosis not present

## 2018-07-03 MED ORDER — GILENYA 0.5 MG PO CAPS: ORAL_CAPSULE | ORAL | 1 refills | Status: DC

## 2018-07-03 NOTE — Progress Notes (Signed)
Virtual Visit via Video Note  I connected with Kelly Grant on 07/03/18 at 10:45 AM EDT by a video enabled telemedicine application and verified that I am speaking with the correct person using two identifiers.  Location: Patient: At her home  Provider: In the office    I discussed the limitations of evaluation and management by telemedicine and the availability of in person appointments. The patient expressed understanding and agreed to proceed.  History of Present Illness: Update July 03, 2018 SS: Ms. Kelly Grant is a 53 year old female with history of relapsing remitting MS. She is currently on Gilenya.  She had MRI of the brain in June 2018 with chronic demyelinating plaques, none were acute and there is no interval change.  She reports she has been doing well since her last visit.  She has not had a recent exacerbation.  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  She denies any problems with her gait or any falls.  She denies numbness or weakness.  She denies any trouble with her bowels or bladder.  She is not having issues with fatigue.  She has an annual eye exam, was last done in January 2020, got a good report.  She denies any new problems or concerns.  She is tolerating Gilenya well.  UPDATE 12/11/2019CM Ms. [name], 53 year old female returns for follow-up with relapsing remitting multiple sclerosis.  Last MRI of the brain June 2018 with chronic demyelinating plaques none of these were acute and there was no interval change.  She is currently on Gilenya tolerating the medication without side effects.  She denies any visual disturbance.  She denies any swallowing problems.  Appetite is good she is sleeping well.  She denies bowel or bladder issues.  She continues to drive.  She continues to work part-time.  She returns for reevaluation  UPDATE 5/29/2019CM Ms. [name], 53 year old female returns for follow-up with a history of relapsing remitting multiple sclerosis.  She is  currently on Gilenya tolerating the medication without side effect.  MRI of the brain June 2018 with chronic demyelinating plaques associated with multiple sclerosis none of these were acute, there is no interval change.  She she denies relation of MS symptoms.  No visual changes , swallowing problems, bowel or bladder issues.  She denies any falls.  She continues to drive without difficulty.  She returns for reevaluation.  She still works part-time.  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 None of the foci appears to be acute. When compared to the MRI dated 11/27/2012, there is no interval change. UPDATE 05/30/2018CM Ms [name], 53 year old female returns for follow-up with a history of relapsing remitting multiple sclerosis. Her initial symptoms were optic neuritis in the 80s. Last MRI of the brain performed on 11/27/2012 was abnormal showing extensive bilateral subcortical and ventricular white matter hyperintensities compatible with chronic demyelinating plaques. No enhancing lesions are seen, presence of atrophy of the corpus colossal and multiple T1 black holes indicate chronic disease. She is currently on Gilenya without side effects and no exacerbation of her symptoms. She denies any visual changes each or swallowing problems new  weakness bowel or bladder issues, or  muscle spasms. She has not had any falls. She was recently placed on fish oil  for elevated cholesterol . She returns for reevaluation 12/22/15 CM Ms. [name],53 year old female returns for followup. She was last seen in this office in 06/21/15  She has  a history of multiple sclerosis relapsing remitting. Initial symptoms were right optic neuritis in the 1980s then in May 2006 a left optic neuritis. MRI of the brain at that time showed typical MS lesions. She is currently on Gilenya tolerating the medication without difficulty. Today she denies spasms, focal weakness, sensory changes, visual changes, speech or swallowing problems, no problems with bowel or bladder function. No exacerbation since last seen, and no new neurologic complaints. Most recent MRI of the brain performed 11/27/2012 was abnormal showing extensive bilateral subcortical and ventricular white matter hyperintensities compatible with chronic demyelinating plaques. No enhancing lesions are seen, presence of atrophy of the corpus colossal and multiple T1 black holes indicate chronic disease. Compared to the MRI July 2006 white matter disease burden is slightly progressed while atrophy and T1 black holes are much more advanced. She denies any recent falls. She is looking for work . She continues to drive without difficulty. She denies any memory issues. She returns for reevaluation.   Observations/Objective: Alert, answers questions appropriately, facial symmetry noted, speech is clear and concise, no arm drift, gait intact, good pace  Assessment and Plan: 1.  Multiple sclerosis  She is doing quite well.  She has not had recent exacerbation.  She will continue taking Gilenya daily.  I will send in a refill.  She will come to the office in 1 to 2 weeks to have lab work done.  I encouraged her to incorporate consistent physical exercise.  Her last MRI of the brain was in 2018, was unchanged from 2014, showed chronic demyelinating plaques. She will follow-up in 6 months or sooner if needed. She would like to see Dr. Krista Blue at next visit.   Follow Up Instructions: 6 months 01/05/2019 1:00 with Dr. Krista Blue   I discussed the assessment and treatment plan with the patient. The patient was provided an  opportunity to ask questions and all were answered. The patient agreed with the plan and demonstrated an understanding of the instructions.   The patient was advised to call back or seek an in-person evaluation if the symptoms worsen or if the condition fails to improve as anticipated.  I provided 15 minutes of non-face-to-face time during this encounter.   Evangeline Dakin, DNP  [facility] [address] [place], [place] [address] [phone]

## 2018-07-03 NOTE — Progress Notes (Signed)
I have reviewed and agreed above plan. 

## 2018-07-14 ENCOUNTER — Other Ambulatory Visit (INDEPENDENT_AMBULATORY_CARE_PROVIDER_SITE_OTHER): Payer: Self-pay

## 2018-07-14 ENCOUNTER — Other Ambulatory Visit: Payer: Self-pay

## 2018-07-14 DIAGNOSIS — G35 Multiple sclerosis: Secondary | ICD-10-CM

## 2018-07-14 DIAGNOSIS — Z0289 Encounter for other administrative examinations: Secondary | ICD-10-CM

## 2018-07-15 ENCOUNTER — Telehealth: Payer: Self-pay

## 2018-07-15 LAB — COMPREHENSIVE METABOLIC PANEL
ALT: 25 IU/L (ref 0–32)
AST: 31 IU/L (ref 0–40)
Albumin/Globulin Ratio: 2.2 (ref 1.2–2.2)
Albumin: 4.3 g/dL (ref 3.8–4.9)
Alkaline Phosphatase: 65 IU/L (ref 39–117)
BUN/Creatinine Ratio: 17 (ref 9–23)
BUN: 13 mg/dL (ref 6–24)
Bilirubin Total: 0.4 mg/dL (ref 0.0–1.2)
CO2: 23 mmol/L (ref 20–29)
Calcium: 10.1 mg/dL (ref 8.7–10.2)
Chloride: 103 mmol/L (ref 96–106)
Creatinine, Ser: 0.78 mg/dL (ref 0.57–1.00)
GFR calc Af Amer: 100 mL/min/{1.73_m2} (ref >59)
GFR calc non Af Amer: 86 mL/min/{1.73_m2} (ref >59)
Globulin, Total: 2 g/dL (ref 1.5–4.5)
Glucose: 91 mg/dL (ref 65–99)
Potassium: 4.3 mmol/L (ref 3.5–5.2)
Sodium: 142 mmol/L (ref 134–144)
Total Protein: 6.3 g/dL (ref 6.0–8.5)

## 2018-07-15 LAB — CBC WITH DIFFERENTIAL/PLATELET
Basophils Absolute: 0 10*3/uL (ref 0.0–0.2)
Basos: 0 %
EOS (ABSOLUTE): 0 10*3/uL (ref 0.0–0.4)
Eos: 1 %
Hematocrit: 39.5 % (ref 34.0–46.6)
Hemoglobin: 13.2 g/dL (ref 11.1–15.9)
Immature Grans (Abs): 0 10*3/uL (ref 0.0–0.1)
Immature Granulocytes: 0 %
Lymphocytes Absolute: 0.9 10*3/uL (ref 0.7–3.1)
Lymphs: 31 %
MCH: 32.3 pg (ref 26.6–33.0)
MCHC: 33.4 g/dL (ref 31.5–35.7)
MCV: 97 fL (ref 79–97)
Monocytes Absolute: 0.6 10*3/uL (ref 0.1–0.9)
Monocytes: 18 %
Neutrophils Absolute: 1.5 10*3/uL (ref 1.4–7.0)
Neutrophils: 50 %
Platelets: 259 10*3/uL (ref 150–450)
RBC: 4.09 x10E6/uL (ref 3.77–5.28)
RDW: 12.8 % (ref 11.7–15.4)
WBC: 3 10*3/uL — ABNORMAL LOW (ref 3.4–10.8)

## 2018-07-15 NOTE — Telephone Encounter (Signed)
Unable to get in contact with the patient. I left a voicemail letting her know that her labs were stable. Office number was provided in case she has any further questions.

## 2018-07-15 NOTE — Telephone Encounter (Signed)
-----   Message from Suzzanne Cloud, NP sent at 07/15/2018 10:12 AM EDT ----- Please call the patient. Labs are stable.

## 2018-08-04 ENCOUNTER — Telehealth: Payer: Self-pay

## 2018-08-04 DIAGNOSIS — B009 Herpesviral infection, unspecified: Secondary | ICD-10-CM

## 2018-08-04 MED ORDER — VALACYCLOVIR HCL 500 MG PO TABS: 500.0000 mg | ORAL_TABLET | Freq: Every day | ORAL | 0 refills | Status: DC

## 2018-08-04 NOTE — Telephone Encounter (Signed)
Refill sent into pharmacy. Thanks!  

## 2018-08-18 ENCOUNTER — Ambulatory Visit (INDEPENDENT_AMBULATORY_CARE_PROVIDER_SITE_OTHER): Payer: Medicaid Other | Admitting: Family Medicine

## 2018-08-18 ENCOUNTER — Other Ambulatory Visit: Payer: Self-pay

## 2018-08-18 ENCOUNTER — Encounter: Payer: Self-pay | Admitting: Family Medicine

## 2018-08-18 VITALS — BP 148/78 | HR 66 | Temp 98.4°F | Resp 16 | Ht 64.0 in | Wt 143.0 lb

## 2018-08-18 DIAGNOSIS — G35 Multiple sclerosis: Secondary | ICD-10-CM | POA: Diagnosis not present

## 2018-08-18 DIAGNOSIS — E785 Hyperlipidemia, unspecified: Secondary | ICD-10-CM | POA: Diagnosis not present

## 2018-08-18 MED ORDER — OMEGA-3-ACID ETHYL ESTERS 1 G PO CAPS: 1.0000 g | ORAL_CAPSULE | Freq: Every day | ORAL | 3 refills | Status: DC

## 2018-08-18 NOTE — Patient Instructions (Signed)
Health Maintenance, Female Adopting a healthy lifestyle and getting preventive care are important in promoting health and wellness. Ask your health care provider about:  The right schedule for you to have regular tests and exams.  Things you can do on your own to prevent diseases and keep yourself healthy. What should I know about diet, weight, and exercise? Eat a healthy diet   Eat a diet that includes plenty of vegetables, fruits, low-fat dairy products, and lean protein.  Do not eat a lot of foods that are high in solid fats, added sugars, or sodium. Maintain a healthy weight Body mass index (BMI) is used to identify weight problems. It estimates body fat based on height and weight. Your health care provider can help determine your BMI and help you achieve or maintain a healthy weight. Get regular exercise Get regular exercise. This is one of the most important things you can do for your health. Most adults should:  Exercise for at least 150 minutes each week. The exercise should increase your heart rate and make you sweat (moderate-intensity exercise).  Do strengthening exercises at least twice a week. This is in addition to the moderate-intensity exercise.  Spend less time sitting. Even light physical activity can be beneficial. Watch cholesterol and blood lipids Have your blood tested for lipids and cholesterol at 54 years of age, then have this test every 5 years. Have your cholesterol levels checked more often if:  Your lipid or cholesterol levels are high.  You are older than 54 years of age.  You are at high risk for heart disease. What should I know about cancer screening? Depending on your health history and family history, you may need to have cancer screening at various ages. This may include screening for:  Breast cancer.  Cervical cancer.  Colorectal cancer.  Skin cancer.  Lung cancer. What should I know about heart disease, diabetes, and high blood  pressure? Blood pressure and heart disease  High blood pressure causes heart disease and increases the risk of stroke. This is more likely to develop in people who have high blood pressure readings, are of African descent, or are overweight.  Have your blood pressure checked: ? Every 3-5 years if you are 18-39 years of age. ? Every year if you are 40 years old or older. Diabetes Have regular diabetes screenings. This checks your fasting blood sugar level. Have the screening done:  Once every three years after age 40 if you are at a normal weight and have a low risk for diabetes.  More often and at a younger age if you are overweight or have a high risk for diabetes. What should I know about preventing infection? Hepatitis B If you have a higher risk for hepatitis B, you should be screened for this virus. Talk with your health care provider to find out if you are at risk for hepatitis B infection. Hepatitis C Testing is recommended for:  Everyone born from 1945 through 1965.  Anyone with known risk factors for hepatitis C. Sexually transmitted infections (STIs)  Get screened for STIs, including gonorrhea and chlamydia, if: ? You are sexually active and are younger than 54 years of age. ? You are older than 54 years of age and your health care provider tells you that you are at risk for this type of infection. ? Your sexual activity has changed since you were last screened, and you are at increased risk for chlamydia or gonorrhea. Ask your health care provider if   you are at risk.  Ask your health care provider about whether you are at high risk for HIV. Your health care provider may recommend a prescription medicine to help prevent HIV infection. If you choose to take medicine to prevent HIV, you should first get tested for HIV. You should then be tested every 3 months for as long as you are taking the medicine. Pregnancy  If you are about to stop having your period (premenopausal) and  you may become pregnant, seek counseling before you get pregnant.  Take 400 to 800 micrograms (mcg) of folic acid every day if you become pregnant.  Ask for birth control (contraception) if you want to prevent pregnancy. Osteoporosis and menopause Osteoporosis is a disease in which the bones lose minerals and strength with aging. This can result in bone fractures. If you are 47 years old or older, or if you are at risk for osteoporosis and fractures, ask your health care provider if you should:  Be screened for bone loss.  Take a calcium or vitamin D supplement to lower your risk of fractures.  Be given hormone replacement therapy (HRT) to treat symptoms of menopause. Follow these instructions at home: Lifestyle  Do not use any products that contain nicotine or tobacco, such as cigarettes, e-cigarettes, and chewing tobacco. If you need help quitting, ask your health care provider.  Do not use street drugs.  Do not share needles.  Ask your health care provider for help if you need support or information about quitting drugs. Alcohol use  Do not drink alcohol if: ? Your health care provider tells you not to drink. ? You are pregnant, may be pregnant, or are planning to become pregnant.  If you drink alcohol: ? Limit how much you use to 0-1 drink a day. ? Limit intake if you are breastfeeding.  Be aware of how much alcohol is in your drink. In the [place] one drink equals one 12 oz bottle of beer (355 mL), one 5 oz glass of wine (148 mL), or one 1 oz glass of hard liquor (44 mL). General instructions  Schedule regular health, dental, and eye exams.  Stay current with your vaccines.  Tell your health care provider if: ? You often feel depressed. ? You have ever been abused or do not feel safe at home. Summary  Adopting a healthy lifestyle and getting preventive care are important in promoting health and wellness.  Follow your health care provider's instructions about healthy  diet, exercising, and getting tested or screened for diseases.  Follow your health care provider's instructions on monitoring your cholesterol and blood pressure. This information is not intended to replace advice given to you by your health care provider. Make sure you discuss any questions you have with your health care provider. Document Released: 07/24/2010 Document Revised: 01/01/2018 Document Reviewed: 01/01/2018 Elsevier Patient Education  2020 [employer]. Preventing High Cholesterol Cholesterol is a white, waxy substance similar to fat that the human body needs to help build cells. The liver makes all the cholesterol that a person's body needs. Having high cholesterol (hypercholesterolemia) increases a person's risk for heart disease and stroke. Extra (excess) cholesterol comes from the food the person eats. High cholesterol can often be prevented with diet and lifestyle changes. If you already have high cholesterol, you can control it with diet and lifestyle changes and with medicine. How can high cholesterol affect me? If you have high cholesterol, deposits (plaques) may build up on the walls of your arteries.  The arteries are the blood vessels that carry blood away from your heart. Plaques make the arteries narrower and stiffer. This can limit or block blood flow and cause blood clots to form. Blood clots:  Are tiny balls of cells that form in your blood.  Can move to the heart or brain, causing a heart attack or stroke. Plaques in arteries greatly increase your risk for heart attack and stroke.Making diet and lifestyle changes can reduce your risk for these conditions that may threaten your life. What can increase my risk? This condition is more likely to develop in people who:  Eat foods that are high in saturated fat or cholesterol. Saturated fat is mostly found in: ? Foods that contain animal fat, such as red meat and some dairy products. ? Certain fatty foods made from  plants, such as tropical oils.  Are overweight.  Are not getting enough exercise.  Have a family history of high cholesterol. What actions can I take to prevent this? Nutrition   Eat less saturated fat.  Avoid trans fats (partially hydrogenated oils). These are often found in margarine and in some baked goods, fried foods, and snacks bought in packages.  Avoid precooked or cured meat, such as sausages or meat loaves.  Avoid foods and drinks that have added sugars.  Eat more fruits, vegetables, and whole grains.  Choose healthy sources of protein, such as fish, poultry, lean cuts of red meat, beans, peas, lentils, and nuts.  Choose healthy sources of fat, such as: ? Nuts. ? Vegetable oils, especially olive oil. ? Fish that have healthy fats (omega-3 fatty acids), such as mackerel or salmon. The items listed above may not be a complete list of recommended foods and beverages. Contact a dietitian for more information. Lifestyle  Lose weight if you are overweight. Losing 5-10 lb (2.3-4.5 kg) can help prevent or control high cholesterol. It can also lower your risk for diabetes and high blood pressure. Ask your health care provider to help you with a diet and exercise plan to lose weight safely.  Do not use any products that contain nicotine or tobacco, such as cigarettes, e-cigarettes, and chewing tobacco. If you need help quitting, ask your health care provider.  Limit your alcohol intake. ? Do not drink alcohol if:  Your health care provider tells you not to drink.  You are pregnant, may be pregnant, or are planning to become pregnant. ? If you drink alcohol:  Limit how much you use to:  0-1 drink a day for women.  0-2 drinks a day for men.  Be aware of how much alcohol is in your drink. In the [place] one drink equals one 12 oz bottle of beer (355 mL), one 5 oz glass of wine (148 mL), or one 1 oz glass of hard liquor (44 mL). Activity   Get enough exercise. Each  week, do at least 150 minutes of exercise that takes a medium level of effort (moderate-intensity exercise). ? This is exercise that:  Makes your heart beat faster and makes you breathe harder than usual.  Allows you to still be able to talk. ? You could exercise in short sessions several times a day or longer sessions a few times a week. For example, on 5 days each week, you could walk fast or ride your bike 3 times a day for 10 minutes each time.  Do exercises as told by your health care provider. Medicines  In addition to diet and lifestyle changes, your health  care provider may recommend medicines to help lower cholesterol. This may be a medicine to lower the amount of cholesterol your liver makes. You may need medicine if: ? Diet and lifestyle changes do not lower your cholesterol enough. ? You have high cholesterol and other risk factors for heart disease or stroke.  Take over-the-counter and prescription medicines only as told by your health care provider. General information  Manage your risk factors for high cholesterol. Talk with your health care provider about all your risk factors and how to lower your risk.  Manage other conditions that you have, such as diabetes or high blood pressure (hypertension).  Have blood tests to check your cholesterol levels at regular points in time as told by your health care provider.  Keep all follow-up visits as told by your health care provider. This is important. Where to find more information  American Heart Association: www.heart.org  National Heart, Lung, and Blood Institute: https://wilson-eaton.com/ Summary  High cholesterol increases your risk for heart disease and stroke. By keeping your cholesterol level low, you can reduce your risk for these conditions.  High cholesterol can often be prevented with diet and lifestyle changes.  Work with your health care provider to manage your risk factors, and have your blood tested regularly.  This information is not intended to replace advice given to you by your health care provider. Make sure you discuss any questions you have with your health care provider. Document Released: 01/23/2015 Document Revised: 05/02/2018 Document Reviewed: 09/17/2015 Elsevier Patient Education  2020 [employer].

## 2018-08-18 NOTE — Progress Notes (Signed)
[facility]   Progress Note: General Provider: Lanae Boast, FNP  SUBJECTIVE:   [facility] is a 53 y.o. female who  has a past medical history of Abnormal Pap smear, Chlamydia, History of chicken pox, HSV infection, Multiple sclerosis ([place]), Neuromuscular disorder ([place]), and Yeast infection.. Patient presents today for Hyperlipidemia  Patient reports compliance with all medications.  Patient denies side effects of medications. 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  She denies any problems with her gait or any falls.  She denies numbness or weakness.  She denies any trouble with her bowels or bladder.  She is not having issues with fatigue.  She has an annual eye exam, was last done in January 2020, got a good report.  She denies any new problems or concerns.  She is fasting for labs today.  Review of Systems  Constitutional: Negative.   HENT: Negative.   Eyes: Negative.   Respiratory: Negative.   Cardiovascular: Negative.   Gastrointestinal: Negative.   Genitourinary: Negative.   Musculoskeletal: Negative.   Skin: Negative.   Neurological: Negative.   Psychiatric/Behavioral: Negative.      OBJECTIVE: BP (!) 148/78 (BP Location: Left Arm, Patient Position: Sitting, Cuff Size: Normal)   Pulse 66   Temp 98.4 F (36.9 C) (Oral)   Resp 16   Ht 5\' 4"  (1.626 m)   Wt 143 lb (64.9 kg)   SpO2 100%   BMI 24.55 kg/m   Wt Readings from Last 3 Encounters:  08/18/18 143 lb (64.9 kg)  04/01/18 139 lb (63 kg)  03/18/18 141 lb (64 kg)     Physical Exam Vitals signs and nursing note reviewed.  Constitutional:      General: She is not in acute distress.    Appearance: Normal appearance.  HENT:     Head: Normocephalic and atraumatic.  Eyes:     Extraocular Movements: Extraocular movements intact.     Conjunctiva/sclera: Conjunctivae normal.     Pupils: Pupils are equal, round, and reactive to light.   Cardiovascular:     Rate and Rhythm: Normal rate and regular rhythm.     Heart sounds: No murmur.  Pulmonary:     Effort: Pulmonary effort is normal.     Breath sounds: Normal breath sounds.  Musculoskeletal: Normal range of motion.  Skin:    General: Skin is warm and dry.  Neurological:     Mental Status: She is alert and oriented to person, place, and time.  Psychiatric:        Mood and Affect: Mood normal.        Behavior: Behavior normal.        Thought Content: Thought content normal.        Judgment: Judgment normal.     ASSESSMENT/PLAN:   1. Hyperlipidemia, unspecified hyperlipidemia type No medication changes warranted at the present time.  - omega-3 acid ethyl esters (LOVAZA) 1 g capsule; Take 1 capsule (1 g total) by mouth daily.  Dispense: 90 capsule; Refill: 3 - Lipid Panel - Comprehensive metabolic panel  2. Multiple sclerosis Patient is being followed by a specialist for this condition. Patient advised to continue with follow up appointments. PCP will continue to monitor progress.     Return in about 6 months (around 02/18/2019) for cholesterol.    The patient was given clear instructions to go to ER or return to medical center if symptoms do not improve, worsen or new problems develop. The  patient verbalized understanding and agreed with plan of care.   [name], FNP-BC [facility] [address] [place], [place] [address] [phone]

## 2018-08-22 ENCOUNTER — Other Ambulatory Visit: Payer: Self-pay | Admitting: Family Medicine

## 2018-08-22 ENCOUNTER — Telehealth: Payer: Self-pay

## 2018-08-22 DIAGNOSIS — E785 Hyperlipidemia, unspecified: Secondary | ICD-10-CM

## 2018-08-22 MED ORDER — ROSUVASTATIN CALCIUM 10 MG PO TABS: 10.0000 mg | ORAL_TABLET | Freq: Every day | ORAL | 3 refills | Status: DC

## 2018-08-22 NOTE — Progress Notes (Signed)
Sent crestor to [facility].

## 2018-08-22 NOTE — Telephone Encounter (Signed)
Called and spoke with patient, advised that cholesterol/triglycerides are still high and that we are adding Crestor to her medication to take at bedtime as directed. Advised that she should continue with Lovaza and we will repeat in 6 months.

## 2018-08-25 ENCOUNTER — Other Ambulatory Visit: Payer: Self-pay

## 2018-08-25 DIAGNOSIS — E785 Hyperlipidemia, unspecified: Secondary | ICD-10-CM

## 2018-08-25 MED ORDER — ROSUVASTATIN CALCIUM 10 MG PO TABS: 10.0000 mg | ORAL_TABLET | Freq: Every day | ORAL | 3 refills | Status: DC

## 2018-09-12 ENCOUNTER — Encounter (HOSPITAL_COMMUNITY): Payer: Self-pay

## 2018-10-01 ENCOUNTER — Encounter (HOSPITAL_COMMUNITY): Payer: Self-pay

## 2018-10-01 ENCOUNTER — Encounter (HOSPITAL_COMMUNITY): Payer: Self-pay | Admitting: *Deleted

## 2018-11-03 ENCOUNTER — Other Ambulatory Visit: Payer: Self-pay

## 2018-11-03 DIAGNOSIS — Z1231 Encounter for screening mammogram for malignant neoplasm of breast: Secondary | ICD-10-CM | POA: Diagnosis not present

## 2018-11-03 DIAGNOSIS — B009 Herpesviral infection, unspecified: Secondary | ICD-10-CM

## 2018-11-03 MED ORDER — VALACYCLOVIR HCL 500 MG PO TABS: 500.0000 mg | ORAL_TABLET | Freq: Every day | ORAL | 0 refills | Status: DC

## 2018-11-04 ENCOUNTER — Telehealth: Payer: Self-pay | Admitting: Internal Medicine

## 2018-11-04 ENCOUNTER — Encounter: Payer: Self-pay | Admitting: Obstetrics

## 2018-11-04 NOTE — Telephone Encounter (Signed)
Spoke with patient and Refilled yesterday.

## 2018-11-25 DIAGNOSIS — Z23 Encounter for immunization: Secondary | ICD-10-CM | POA: Diagnosis not present

## 2019-01-05 ENCOUNTER — Ambulatory Visit: Payer: Medicaid Other | Admitting: Neurology

## 2019-01-08 ENCOUNTER — Other Ambulatory Visit: Payer: Self-pay | Admitting: Neurology
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
## 2019-01-20 ENCOUNTER — Other Ambulatory Visit: Payer: Self-pay

## 2019-01-20 ENCOUNTER — Ambulatory Visit: Payer: Medicaid Other | Admitting: Neurology

## 2019-01-20 ENCOUNTER — Encounter: Payer: Self-pay | Admitting: Neurology

## 2019-01-20 VITALS — BP 132/71 | HR 70 | Temp 97.1°F | Ht 64.0 in | Wt 144.2 lb

## 2019-01-20 DIAGNOSIS — G35 Multiple sclerosis: Secondary | ICD-10-CM

## 2019-01-20 NOTE — Patient Instructions (Signed)
Continue Gilenya daily   I will check lab work today   We will consider repeat MRI of the brain at next visit   Consider starting a low impact exercise program next year :)  I will see you in 6 months or sooner if needed

## 2019-01-21 ENCOUNTER — Telehealth: Payer: Self-pay | Admitting: *Deleted

## 2019-01-21 LAB — COMPREHENSIVE METABOLIC PANEL
ALT: 63 IU/L — ABNORMAL HIGH (ref 0–32)
AST: 69 IU/L — ABNORMAL HIGH (ref 0–40)
Albumin/Globulin Ratio: 2.6 — ABNORMAL HIGH (ref 1.2–2.2)
Albumin: 4.2 g/dL (ref 3.8–4.9)
Alkaline Phosphatase: 65 IU/L (ref 39–117)
BUN/Creatinine Ratio: 19 (ref 9–23)
BUN: 16 mg/dL (ref 6–24)
Bilirubin Total: 0.2 mg/dL (ref 0.0–1.2)
CO2: 24 mmol/L (ref 20–29)
Calcium: 8.8 mg/dL (ref 8.7–10.2)
Chloride: 108 mmol/L — ABNORMAL HIGH (ref 96–106)
Creatinine, Ser: 0.83 mg/dL (ref 0.57–1.00)
GFR calc Af Amer: 92 mL/min/{1.73_m2} (ref >59)
GFR calc non Af Amer: 80 mL/min/{1.73_m2} (ref >59)
Globulin, Total: 1.6 g/dL (ref 1.5–4.5)
Glucose: 95 mg/dL (ref 65–99)
Potassium: 3.8 mmol/L (ref 3.5–5.2)
Sodium: 145 mmol/L — ABNORMAL HIGH (ref 134–144)
Total Protein: 5.8 g/dL — ABNORMAL LOW (ref 6.0–8.5)

## 2019-01-21 LAB — CBC WITH DIFFERENTIAL/PLATELET
Basophils Absolute: 0 10*3/uL (ref 0.0–0.2)
Basos: 0 %
EOS (ABSOLUTE): 0.1 10*3/uL (ref 0.0–0.4)
Eos: 2 %
Hematocrit: 40 % (ref 34.0–46.6)
Hemoglobin: 12.7 g/dL (ref 11.1–15.9)
Immature Grans (Abs): 0 10*3/uL (ref 0.0–0.1)
Immature Granulocytes: 0 %
Lymphocytes Absolute: 0.4 10*3/uL — ABNORMAL LOW (ref 0.7–3.1)
Lymphs: 11 %
MCH: 33 pg (ref 26.6–33.0)
MCHC: 31.8 g/dL (ref 31.5–35.7)
MCV: 104 fL — ABNORMAL HIGH (ref 79–97)
Monocytes Absolute: 0.6 10*3/uL (ref 0.1–0.9)
Monocytes: 15 %
Neutrophils Absolute: 2.8 10*3/uL (ref 1.4–7.0)
Neutrophils: 72 %
Platelets: 230 10*3/uL (ref 150–450)
RBC: 3.85 x10E6/uL (ref 3.77–5.28)
RDW: 13.1 % (ref 11.7–15.4)
WBC: 3.9 10*3/uL (ref 3.4–10.8)

## 2019-01-21 NOTE — Telephone Encounter (Signed)
Attempted to reach patient to give lab results. Message stated she could not be reached at this time, call later.

## 2019-01-26 ENCOUNTER — Telehealth: Payer: Self-pay | Admitting: *Deleted

## 2019-01-26 NOTE — Telephone Encounter (Signed)
-----   Message from Britt Bottom, MD sent at 01/21/2019  8:38 AM EST ----- Please let the patient know that the lab work showed mild liver changes --   If taking Tylenol or drinking more than one alcoholic a day, try to cut down or stop  We need to recheck in 6-8 weeks to make sure it it not getting worse.

## 2019-01-26 NOTE — Telephone Encounter (Signed)
I called pt and relayed that her labs values showed mild liver changes.  Mentioned that tkaind tylenol or more than one alcoholic drink a day may cause this ( try to cut down or stop).  Will recheck in 6-8 wks. She will place on her calendar to come back in for lab recheck.

## 2019-02-03 ENCOUNTER — Telehealth: Payer: Self-pay | Admitting: Internal Medicine

## 2019-02-03 DIAGNOSIS — B009 Herpesviral infection, unspecified: Secondary | ICD-10-CM

## 2019-02-03 MED ORDER — VALACYCLOVIR HCL 500 MG PO TABS: 500.0000 mg | ORAL_TABLET | Freq: Every day | ORAL | 0 refills | Status: DC

## 2019-02-03 NOTE — Telephone Encounter (Signed)
Refill for valtrex sent to pharmacy. Thanks!

## 2019-02-18 ENCOUNTER — Ambulatory Visit (INDEPENDENT_AMBULATORY_CARE_PROVIDER_SITE_OTHER): Payer: Medicaid Other | Admitting: Nurse Practitioner

## 2019-02-18 ENCOUNTER — Other Ambulatory Visit: Payer: Self-pay

## 2019-02-18 ENCOUNTER — Encounter: Payer: Self-pay | Admitting: Nurse Practitioner

## 2019-02-18 VITALS — BP 119/74 | HR 62 | Temp 97.7°F | Resp 14 | Ht 64.0 in | Wt 137.0 lb

## 2019-02-18 DIAGNOSIS — Z0000 Encounter for general adult medical examination without abnormal findings: Secondary | ICD-10-CM | POA: Diagnosis not present

## 2019-02-18 DIAGNOSIS — E785 Hyperlipidemia, unspecified: Secondary | ICD-10-CM

## 2019-02-18 DIAGNOSIS — B009 Herpesviral infection, unspecified: Secondary | ICD-10-CM | POA: Diagnosis not present
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
## 2019-02-19 LAB — COMP. METABOLIC PANEL (12)
AST: 28 IU/L (ref 0–40)
Albumin/Globulin Ratio: 2.2 (ref 1.2–2.2)
Albumin: 4.2 g/dL (ref 3.8–4.9)
Alkaline Phosphatase: 70 IU/L (ref 39–117)
BUN/Creatinine Ratio: 12 (ref 9–23)
BUN: 8 mg/dL (ref 6–24)
Bilirubin Total: 0.4 mg/dL (ref 0.0–1.2)
Calcium: 9 mg/dL (ref 8.7–10.2)
Chloride: 109 mmol/L — ABNORMAL HIGH (ref 96–106)
Creatinine, Ser: 0.69 mg/dL (ref 0.57–1.00)
GFR calc Af Amer: 114 mL/min/{1.73_m2} (ref >59)
GFR calc non Af Amer: 99 mL/min/{1.73_m2} (ref >59)
Globulin, Total: 1.9 g/dL (ref 1.5–4.5)
Glucose: 79 mg/dL (ref 65–99)
Potassium: 3.9 mmol/L (ref 3.5–5.2)
Sodium: 146 mmol/L — ABNORMAL HIGH (ref 134–144)
Total Protein: 6.1 g/dL (ref 6.0–8.5)

## 2019-02-19 LAB — LIPID PANEL
Chol/HDL Ratio: 2.6 ratio (ref 0.0–4.4)
Cholesterol, Total: 146 mg/dL (ref 100–199)
HDL: 56 mg/dL (ref >39)
LDL Chol Calc (NIH): 73 mg/dL (ref 0–99)
Triglycerides: 94 mg/dL (ref 0–149)
VLDL Cholesterol Cal: 17 mg/dL (ref 5–40)

## 2019-02-19 LAB — VITAMIN D 25 HYDROXY (VIT D DEFICIENCY, FRACTURES): Vit D, 25-Hydroxy: 50.6 ng/mL (ref 30.0–100.0)

## 2019-02-19 LAB — VITAMIN B12: Vitamin B-12: 682 pg/mL (ref 232–1245)

## 2019-02-28 DIAGNOSIS — H40033 Anatomical narrow angle, bilateral: Secondary | ICD-10-CM | POA: Diagnosis not present

## 2019-02-28 DIAGNOSIS — H16223 Keratoconjunctivitis sicca, not specified as Sjogren's, bilateral: Secondary | ICD-10-CM | POA: Diagnosis not present

## 2019-03-11 ENCOUNTER — Other Ambulatory Visit (INDEPENDENT_AMBULATORY_CARE_PROVIDER_SITE_OTHER): Payer: Self-pay

## 2019-03-11 ENCOUNTER — Other Ambulatory Visit: Payer: Self-pay

## 2019-03-11 ENCOUNTER — Telehealth: Payer: Self-pay | Admitting: Neurology

## 2019-03-11 DIAGNOSIS — Z0289 Encounter for other administrative examinations: Secondary | ICD-10-CM

## 2019-03-11 NOTE — Telephone Encounter (Signed)
She came in today for repeat CMP while on Gilenya.  In December, AST, ALT were elevated.  On January 27, she had a repeat CMP, AST was completely normal at 28, interestingly, the report did not include ALT.alkaline phosphatase was normal. We can recheck again at next visit. She said in December she has been taking frequent Tylenol at the time of elevated liver enzymes.

## 2019-03-27 ENCOUNTER — Ambulatory Visit (INDEPENDENT_AMBULATORY_CARE_PROVIDER_SITE_OTHER): Payer: Medicaid Other | Admitting: Obstetrics

## 2019-03-27 ENCOUNTER — Encounter: Payer: Self-pay | Admitting: Obstetrics

## 2019-03-27 ENCOUNTER — Other Ambulatory Visit: Payer: Self-pay

## 2019-03-27 ENCOUNTER — Other Ambulatory Visit (HOSPITAL_COMMUNITY)
Admission: RE | Admit: 2019-03-27 | Discharge: 2019-03-27 | Disposition: A | Discharge status: Home / Self Care | Payer: BC Managed Care – PPO | Source: Ambulatory Visit | Attending: Obstetrics | Admitting: Obstetrics

## 2019-03-27 VITALS — BP 126/76 | HR 63 | Ht 64.0 in | Wt 140.3 lb

## 2019-03-27 DIAGNOSIS — Z0000 Encounter for general adult medical examination without abnormal findings: Secondary | ICD-10-CM | POA: Diagnosis not present

## 2019-03-27 DIAGNOSIS — Z01419 Encounter for gynecological examination (general) (routine) without abnormal findings: Secondary | ICD-10-CM | POA: Diagnosis not present

## 2019-03-27 NOTE — Progress Notes (Signed)
Subjective:        Kelly Grant is a 54 y.o. female here for a routine exam.  Current complaints: None.    Personal health questionnaire:  Is [name], have a family history of breast and/or ovarian cancer: no Is there a family history of uterine cancer diagnosed at age < 32, gastrointestinal cancer, urinary tract cancer, family member who is a Field seismologist syndrome-associated carrier: no Is the patient overweight and hypertensive, family history of diabetes, personal history of gestational diabetes, preeclampsia or PCOS: no Is patient over 73, have PCOS,  family history of premature CHD under age 17, diabetes, smoke, have hypertension or peripheral artery disease:  no At any time, has a partner hit, kicked or otherwise hurt or frightened you?: no Over the past 2 weeks, have you felt down, depressed or hopeless?: no Over the past 2 weeks, have you felt little interest or pleasure in doing things?:no   Gynecologic History No LMP recorded. Patient is postmenopausal. Contraception: post menopausal status and tubal ligation Last Pap: 02-24-2018. Results were: normal Last mammogram: 10-28-2017. Results were: normal  Obstetric History OB History  Gravida Para Term Preterm AB Living  3 2     1 2   SAB TAB Ectopic Multiple Live Births    1     2    # Outcome Date GA Lbr Len/2nd Weight Sex Delivery Anes PTL Lv  3 Para           2 Para           1 TAB             Past Medical History:  Diagnosis Date  . Abnormal Pap smear    ASC-H  . Chlamydia   . History of chicken pox   . HSV infection   . Multiple sclerosis ([place])   . Neuromuscular disorder ([place])    MS  . Yeast infection     Past Surgical History:  Procedure Laterality Date  . CARPAL TUNNEL RELEASE    . CESAREAN SECTION     x2  . INCISION AND DRAINAGE ABSCESS     left buttock  . TUBAL LIGATION       Current Outpatient Medications:  .  aspirin EC 81 MG tablet, Take 1 tablet (81 mg total) by mouth daily.,  Disp: 30 tablet, Rfl: 11 .  calcium-vitamin D (OSCAL WITH D) 500-200 MG-UNIT tablet, Take 2 tablets by mouth. , Disp: , Rfl:  .  carboxymethylcellulose (REFRESH PLUS) 0.5 % SOLN, 1 drop daily as needed., Disp: , Rfl:  .  CycloSPORINE (RESTASIS OP), Apply to eye., Disp: , Rfl:  .  diclofenac sodium (VOLTAREN) 1 % GEL, Apply topically as needed., Disp: , Rfl:  .  GILENYA 0.5 MG CAPS, TAKE ONE CAPSULE BY MOUTH ONCE DAILY. MAY TAKE WITH OR WITHOUT FOOD. STORE AT ROOM TEMPERATURE., Disp: 90 capsule, Rfl: 1 .  Multiple Vitamin (MULTIVITAMIN) tablet, Take 1 tablet by mouth daily., Disp: 30 tablet, Rfl: 3 .  omega-3 acid ethyl esters (LOVAZA) 1 g capsule, Take 1 capsule (1 g total) by mouth daily., Disp: 90 capsule, Rfl: 3 .  rosuvastatin (CRESTOR) 10 MG tablet, Take 1 tablet (10 mg total) by mouth at bedtime., Disp: 90 tablet, Rfl: 3 .  valACYclovir (VALTREX) 500 MG tablet, Take 1 tablet (500 mg total) by mouth daily., Disp: 90 tablet, Rfl: 0 .  Wheat Dextrin (BENEFIBER DRINK MIX PO), Take by mouth. 2 tsp TID, Disp: , Rfl:  Allergies  Allergen Reactions  . Macrobid [Nitrofurantoin Macrocrystal] Hives    Social History   Tobacco Use  . Smoking status: Former Smoker    Quit date: 01/23/2000    Years since quitting: 19.1  . Smokeless tobacco: Never Used  Substance Use Topics  . Alcohol use: Yes    Alcohol/week: 0.0 standard drinks    Comment: occasional    Family History  Problem Relation Age of Onset  . Kidney failure Mother   . Diabetes Mother   . Colon cancer Neg Hx       Review of Systems  Constitutional: negative for fatigue and weight loss Respiratory: negative for cough and wheezing Cardiovascular: negative for chest pain, fatigue and palpitations Gastrointestinal: negative for abdominal pain and change in bowel habits Musculoskeletal:negative for myalgias Neurological: negative for gait problems and tremors Behavioral/Psych: negative for abusive relationship,  depression Endocrine: negative for temperature intolerance    Genitourinary:negative for abnormal menstrual periods, genital lesions, hot flashes, sexual problems and vaginal discharge Integument/breast: negative for breast lump, breast tenderness, nipple discharge and skin lesion(s)    Objective:       BP 126/76   Pulse 63   Ht 5\' 4"  (1.626 m)   Wt 140 lb 4.8 oz (63.6 kg)   BMI 24.08 kg/m  General:   alert  Skin:   no rash or abnormalities  Lungs:   clear to auscultation bilaterally  Heart:   regular rate and rhythm, S1, S2 normal, no murmur, click, rub or gallop  Breasts:   normal without suspicious masses, skin or nipple changes or axillary nodes  Abdomen:  normal findings: no organomegaly, soft, non-tender and no hernia  Pelvis:  External genitalia: normal general appearance Urinary system: urethral meatus normal and bladder without fullness, nontender Vaginal: normal without tenderness, induration or masses Cervix: normal appearance Adnexa: normal bimanual exam Uterus: anteverted and non-tender, normal size   Lab Review Urine pregnancy test Labs reviewed yes Radiologic studies reviewed yes  50% of 25 min visit spent on counseling and coordination of care.   Assessment:    Healthy female exam.    Plan:    Education reviewed: calcium supplements, depression evaluation, low fat, low cholesterol diet, safe sex/STD prevention, self breast exams, skin cancer screening and weight bearing exercise. Contraception: post menopausal status and tubal ligation. Follow up in: 1 year.   No orders of the defined types were placed in this encounter.  No orders of the defined types were placed in this encounter.   Shelly Bombard, MD 03/27/2019 9:54 AM

## 2019-03-27 NOTE — Progress Notes (Signed)
Patient presents for AEX.  Last pap: 02/2018 LGSIL Colpo: 03/18/18 LGSIL MM: 11/2018 per patient Normal

## 2019-03-29 DIAGNOSIS — H5213 Myopia, bilateral: Secondary | ICD-10-CM | POA: Diagnosis not present

## 2019-04-02 ENCOUNTER — Ambulatory Visit: Payer: Medicaid Other | Admitting: Neurology

## 2019-04-03 LAB — CYTOLOGY - PAP
Comment: NEGATIVE
Comment: NEGATIVE
Diagnosis: HIGH — AB
HPV 16: NEGATIVE
HPV 18 / 45: NEGATIVE
High risk HPV: POSITIVE — AB

## 2019-05-02 ENCOUNTER — Other Ambulatory Visit: Payer: Self-pay

## 2019-05-02 ENCOUNTER — Emergency Department (HOSPITAL_COMMUNITY)
Admission: EM | Admit: 2019-05-02 | Discharge: 2019-05-02 | Disposition: A | Discharge status: Home / Self Care | Payer: BC Managed Care – PPO | Attending: Emergency Medicine | Admitting: Emergency Medicine

## 2019-05-02 ENCOUNTER — Encounter (HOSPITAL_COMMUNITY): Payer: Self-pay | Admitting: Student

## 2019-05-02 ENCOUNTER — Emergency Department (HOSPITAL_COMMUNITY): Payer: BC Managed Care – PPO

## 2019-05-02 DIAGNOSIS — Z87891 Personal history of nicotine dependence: Secondary | ICD-10-CM | POA: Diagnosis not present

## 2019-05-02 DIAGNOSIS — Z79899 Other long term (current) drug therapy: Secondary | ICD-10-CM | POA: Insufficient documentation

## 2019-05-02 DIAGNOSIS — G35 Multiple sclerosis: Secondary | ICD-10-CM | POA: Insufficient documentation

## 2019-05-02 DIAGNOSIS — M19071 Primary osteoarthritis, right ankle and foot: Secondary | ICD-10-CM | POA: Diagnosis not present

## 2019-05-02 DIAGNOSIS — M79671 Pain in right foot: Secondary | ICD-10-CM | POA: Insufficient documentation

## 2019-05-02 DIAGNOSIS — Z7982 Long term (current) use of aspirin: Secondary | ICD-10-CM | POA: Insufficient documentation

## 2019-05-02 MED ORDER — NAPROXEN 500 MG PO TABS: 500.0000 mg | ORAL_TABLET | Freq: Two times a day (BID) | ORAL | 0 refills | Status: DC

## 2019-05-02 NOTE — Discharge Instructions (Signed)
Please read and follow all provided instructions.  You have been seen today for right foot pain.   Tests performed today include: An x-ray of the affected area - does NOT show any broken bones or dislocations.  Vital signs. See below for your results today.   We suspect you may have what is known as plantar fasciitis, please see attached handout for further information regarding this diagnosis.  Please ask the pharmacist about heel cups, these are shoe inserts to help with this condition.   Medications:  - Naproxen is a nonsteroidal anti-inflammatory medication that will help with pain and swelling. Be sure to take this medication as prescribed with food, 1 pill every 12 hours,  It should be taken with food, as it can cause stomach upset, and more seriously, stomach bleeding. Do not take other nonsteroidal anti-inflammatory medications with this such as Advil, Motrin, Aleve, Mobic, Goodie Powder, or Motrin.    You make take Tylenol per over the counter dosing with these medications.   We have prescribed you new medication(s) today. Discuss the medications prescribed today with your pharmacist as they can have adverse effects and interactions with your other medicines including over the counter and prescribed medications. Seek medical evaluation if you start to experience new or abnormal symptoms after taking one of these medicines, seek care immediately if you start to experience difficulty breathing, feeling of your throat closing, facial swelling, or rash as these could be indications of a more serious allergic reaction   Follow-up instructions: Please follow-up with your primary care provider or the provided orthopedic physician (bone specialist) if you continue to have significant pain in 1 week.   Return instructions:  Please return if your digits or extremity are numb or tingling, appear gray or blue, or you have severe pain (also elevate the extremity and loosen splint or wrap if you  were given one) Please return if you have redness or fevers.  Please return to the Emergency Department if you experience worsening symptoms.  Please return if you have any other emergent concerns. Additional Information:  Your vital signs today were: BP (!) 142/95 (BP Location: Left Arm)   Pulse 68   Temp 98.5 F (36.9 C) (Oral)   Resp 16   Ht 5\' 4"  (1.626 m)   Wt 63.5 kg   SpO2 99%   BMI 24.03 kg/m  If your blood pressure (BP) was elevated above 135/85 this visit, please have this repeated by your doctor within one month. ---------------

## 2019-05-02 NOTE — ED Provider Notes (Signed)
[facility] DEPT Provider Note   CSN: [id number] Arrival date & time: 05/02/19  1609     History Chief Complaint  Patient presents with  . Foot Pain    Kelly Grant is a 54 y.o. female with a history of multiple sclerosis & prior tubal ligation who presents to the ED with complaints of right foot pain x 2-3 days. Patient states pain is to the sole of her foot, constant, more so with weightbearing, worse in the AM with first steps or when on her feet for extended periods of time at work, no significant alleviating factors. Tried some type of topical therapy without much relief. Denies specific trauma or change in activity. Denies recent new footwear. Denies fever, chills, color change, or acute numbness/weakness.   HPI     Past Medical History:  Diagnosis Date  . Abnormal Pap smear    ASC-H  . Chlamydia   . History of chicken pox   . HSV infection   . Multiple sclerosis ([place])   . Neuromuscular disorder ([place])    MS  . Yeast infection     Patient Active Problem List   Diagnosis Date Noted  . Encounter for therapeutic drug monitoring 12/13/2014  . Multiple sclerosis ([place]) 05/08/2012  . Pap smear abnormality of cervix 06/22/2011  . HSV infection 06/22/2011    Past Surgical History:  Procedure Laterality Date  . CARPAL TUNNEL RELEASE    . CESAREAN SECTION     x2  . INCISION AND DRAINAGE ABSCESS     left buttock  . TUBAL LIGATION       OB History    Gravida  3   Para  2   Term      Preterm      AB  1   Living  2     SAB      TAB  1   Ectopic      Multiple      Live Births  2           Family History  Problem Relation Age of Onset  . Kidney failure Mother   . Diabetes Mother   . Colon cancer Neg Hx     Social History   Tobacco Use  . Smoking status: Former Smoker    Quit date: 01/23/2000    Years since quitting: 19.2  . Smokeless tobacco: Never Used  Substance Use Topics  . Alcohol use: Yes   Alcohol/week: 0.0 standard drinks    Comment: occasional  . Drug use: No    Home Medications Prior to Admission medications   Medication Sig Start Date End Date Taking? Authorizing Provider  aspirin EC 81 MG tablet Take 1 tablet (81 mg total) by mouth daily. 02/15/17   Dorena Dew, FNP  calcium-vitamin D (OSCAL WITH D) 500-200 MG-UNIT tablet Take 2 tablets by mouth.     [provider]  carboxymethylcellulose (REFRESH PLUS) 0.5 % SOLN 1 drop daily as needed.    [provider]  CycloSPORINE (RESTASIS OP) Apply to eye.    [provider]  diclofenac sodium (VOLTAREN) 1 % GEL Apply topically as needed.    [provider]  GILENYA 0.5 MG CAPS TAKE ONE CAPSULE BY MOUTH ONCE DAILY. MAY TAKE WITH OR WITHOUT FOOD. STORE AT ROOM TEMPERATURE. 01/08/19   Suzzanne Cloud, NP  Multiple Vitamin (MULTIVITAMIN) tablet Take 1 tablet by mouth daily. 04/29/15   Dorena Dew, FNP  omega-3  acid ethyl esters (LOVAZA) 1 g capsule Take 1 capsule (1 g total) by mouth daily. 08/18/18   Lanae Boast, FNP  rosuvastatin (CRESTOR) 10 MG tablet Take 1 tablet (10 mg total) by mouth at bedtime. 08/25/18 08/25/19  Lanae Boast, FNP  valACYclovir (VALTREX) 500 MG tablet Take 1 tablet (500 mg total) by mouth daily. 02/03/19   Tresa Garter, MD  Wheat Dextrin (BENEFIBER DRINK MIX PO) Take by mouth. 2 tsp TID    [provider]    Allergies    Macrobid [nitrofurantoin macrocrystal]  Review of Systems   Review of Systems  Constitutional: Negative for chills and fever.  Respiratory: Negative for shortness of breath.   Cardiovascular: Negative for chest pain.  Gastrointestinal: Negative for abdominal pain.  Musculoskeletal: Positive for arthralgias and myalgias.  Skin: Negative for color change and wound.  Neurological: Negative for weakness and numbness.    Physical Exam Updated Vital Signs BP (!) 142/95 (BP Location: Left Arm)   Pulse 68   Temp 98.5 F (36.9  C) (Oral)   Resp 16   SpO2 99%   Physical Exam Vitals and nursing note reviewed.  Constitutional:      General: She is not in acute distress.    Appearance: She is not ill-appearing or toxic-appearing.  HENT:     Head: Normocephalic and atraumatic.  Cardiovascular:     Pulses:          Dorsalis pedis pulses are 2+ on the right side and 2+ on the left side.       Posterior tibial pulses are 2+ on the right side and 2+ on the left side.  Pulmonary:     Effort: Pulmonary effort is normal.  Musculoskeletal:     Comments: Lower extremities: No obvious deformity, appreciable swelling, edema, erythema, ecchymosis, warmth, or open wounds. Patient has intact AROM to bilateral hips, knees, ankles, and all digits. Tender to palpation to the plantar aspect of the R foot heel and midfoot, more so to the midfoot and more so laterally. Otherwise nontender. Compartments are soft.   Skin:    General: Skin is warm and dry.     Capillary Refill: Capillary refill takes less than 2 seconds.  Neurological:     Mental Status: She is alert.     Comments: Alert. Clear speech. Sensation grossly intact to bilateral lower extremities. 5/5 strength with plantar/dorsiflexion bilaterally.   Psychiatric:        Mood and Affect: Mood normal.        Behavior: Behavior normal.     ED Results / Procedures / Treatments   Labs (all labs ordered are listed, but only abnormal results are displayed) Labs Reviewed - No data to display  EKG None  Radiology DG Foot Complete Right  Result Date: 05/02/2019 CLINICAL DATA:  Calcaneal pain for 2 days radiating into the calf. Increased pain with walking. EXAM: RIGHT FOOT COMPLETE - 3+ VIEW COMPARISON:  None. FINDINGS: The bones appear mildly demineralized. There is no evidence of acute fracture or dislocation. Minimal joint space narrowing at the 1st metatarsophalangeal joint. No calcaneal sclerosis to suggest stress fracture. The soft tissues appear unremarkable.  IMPRESSION: No acute osseous findings. Minimal 1st MTP degenerative changes. Electronically Signed   By: Richardean Sale M.D.   On: 05/02/2019 17:14    Procedures Procedures (including critical care time)  Medications Ordered in ED Medications - No data to display  ED Course  I have reviewed the triage vital signs and  the nursing notes.  Pertinent labs & imaging results that were available during my care of the patient were reviewed by me and considered in my medical decision making (see chart for details).    MDM Rules/Calculators/A&P                     Patient presents to the ED with 2-3 days of R foot pain.  Nontoxic, resting comfortable, vitals without significant abnormality, BP elevated- doubt HTN emergency. No signs of infection. No edema, no calf tenderness doubt DVT. I ordered imaging study of right foot xray which I independently visualized and interpreted imaging which showed no acute abnormality- no signs of fracture/dislocation. Considering plantar fascitis given H&P. Additional history/ information obtained from chart review- last creatinine WNL therefore will trial NSAID with recommendation for heel cups & orthopedics follow up. I discussed results, treatment plan, need for follow-up, and return precautions with the patient. Provided opportunity for questions, patient confirmed understanding and is in agreement with plan.   Final Clinical Impression(s) / ED Diagnoses Final diagnoses:  Right foot pain    Rx / DC Orders ED Discharge Orders         Ordered    naproxen (NAPROSYN) 500 MG tablet  2 times daily     05/02/19 [address], [place], PA-C 05/02/19 1738    Lajean Saver, MD 05/03/19 1524

## 2019-05-02 NOTE — ED Triage Notes (Signed)
Pain on bottom of rt foot for a couple of days

## 2019-05-11 ENCOUNTER — Telehealth: Payer: Self-pay | Admitting: Nurse Practitioner

## 2019-05-11 ENCOUNTER — Other Ambulatory Visit: Payer: Self-pay

## 2019-05-11 DIAGNOSIS — B009 Herpesviral infection, unspecified: Secondary | ICD-10-CM

## 2019-05-11 MED ORDER — VALACYCLOVIR HCL 500 MG PO TABS: 500.0000 mg | ORAL_TABLET | Freq: Every day | ORAL | 0 refills | Status: DC

## 2019-05-11 NOTE — Telephone Encounter (Signed)
error 

## 2019-05-19 ENCOUNTER — Encounter: Payer: Self-pay | Admitting: Gastroenterology

## 2019-07-20 ENCOUNTER — Ambulatory Visit (INDEPENDENT_AMBULATORY_CARE_PROVIDER_SITE_OTHER): Payer: BC Managed Care – PPO | Admitting: Neurology

## 2019-07-20 ENCOUNTER — Other Ambulatory Visit: Payer: Self-pay

## 2019-07-20 ENCOUNTER — Encounter: Payer: Self-pay | Admitting: Neurology

## 2019-07-20 VITALS — BP 128/78 | HR 63 | Ht 64.0 in | Wt 139.0 lb

## 2019-07-20 DIAGNOSIS — G35 Multiple sclerosis: Secondary | ICD-10-CM

## 2019-07-20 MED ORDER — GILENYA 0.5 MG PO CAPS: ORAL_CAPSULE | ORAL | 3 refills | Status: DC

## 2019-07-20 NOTE — Patient Instructions (Signed)
Check blood work today  Continue Gilenya Order MRI of the brain  Refer you to eye doctor for yearly evaluation  See you back in 6 months

## 2019-07-20 NOTE — Progress Notes (Signed)
PATIENT: Kelly Grant DOB: [birth year]  REASON FOR VISIT: follow up HISTORY FROM: patient  HISTORY OF PRESENT ILLNESS: Today 07/20/19  Kelly Grant is a 54 year old female with history of relapsing remitting multiple sclerosis.  She remains on Gilenya.  She denies any new or worsening MS symptoms.  Feels her MS is overall stable.  She does have chronic weakness to the left arm.  She continues to work full-time on [employer], she is a Librarian, academic.  She does her job well. She works 6 days a week. Did have eye exam done at [facility] in January, isn't sure how thorough it was.  Presents today for evaluation unaccompanied.  HISTORY 01/20/2019 SS: Kelly Grant is a 54 year old female with history of relapsing remitting multiple sclerosis.  She remains on Gilenya.  MRI of the brain in June 2018, showing no change from prior in 2014.  She has continued to do well.  She is working full-time on a Garment/textile technologist.  She denies any changes to her health.  She denies any new numbness or weakness in her arms or legs, changes to the vision, or changes to the bowels or bladder.  She has not had any falls.  She feels her MS is overall stable.  She says she does have some chronic weakness to her left arm.  She drives a car without difficulty.  She works second shift at her job.  She admits she is not exercising as she should.  She is due for a yearly eye exam in early 2021.  She presents today for evaluation unaccompanied.  REVIEW OF SYSTEMS: Out of a complete 14 system review of symptoms, the patient complains only of the following symptoms, and all other reviewed systems are negative.  N/A  ALLERGIES: Allergies  Allergen Reactions  . Macrobid [Nitrofurantoin Macrocrystal] Hives    HOME MEDICATIONS: Outpatient Medications Prior to Visit  Medication Sig Dispense Refill  . aspirin EC 81 MG tablet Take 1 tablet (81 mg total) by mouth daily. 30 tablet 11  . calcium-vitamin D (OSCAL WITH D)  500-200 MG-UNIT tablet Take 2 tablets by mouth.     . carboxymethylcellulose (REFRESH PLUS) 0.5 % SOLN 1 drop daily as needed.    . cycloSPORINE (RESTASIS) 0.05 % ophthalmic emulsion 1 drop 2 (two) times daily.    . diclofenac sodium (VOLTAREN) 1 % GEL Apply topically as needed.    . Multiple Vitamin (MULTIVITAMIN) tablet Take 1 tablet by mouth daily. 30 tablet 3  . omega-3 acid ethyl esters (LOVAZA) 1 g capsule Take 1 capsule (1 g total) by mouth daily. 90 capsule 3  . rosuvastatin (CRESTOR) 10 MG tablet Take 1 tablet (10 mg total) by mouth at bedtime. 90 tablet 3  . valACYclovir (VALTREX) 500 MG tablet Take 1 tablet (500 mg total) by mouth daily. 90 tablet 0  . Wheat Dextrin (BENEFIBER DRINK MIX PO) Take by mouth. 2 tsp TID    . GILENYA 0.5 MG CAPS TAKE ONE CAPSULE BY MOUTH ONCE DAILY. MAY TAKE WITH OR WITHOUT FOOD. STORE AT ROOM TEMPERATURE. 90 capsule 1  . CycloSPORINE (RESTASIS OP) Apply to eye.    . naproxen (NAPROSYN) 500 MG tablet Take 1 tablet (500 mg total) by mouth 2 (two) times daily. 10 tablet 0   No facility-administered medications prior to visit.    PAST MEDICAL HISTORY: Past Medical History:  Diagnosis Date  . Abnormal Pap smear    ASC-H  . Chlamydia   .  History of chicken pox   . HSV infection   . Multiple sclerosis ([place])   . Neuromuscular disorder ([place])    MS  . Yeast infection     PAST SURGICAL HISTORY: Past Surgical History:  Procedure Laterality Date  . CARPAL TUNNEL RELEASE    . CESAREAN SECTION     x2  . INCISION AND DRAINAGE ABSCESS     left buttock  . TUBAL LIGATION      FAMILY HISTORY: Family History  Problem Relation Age of Onset  . Kidney failure Mother   . Diabetes Mother   . Colon cancer Neg Hx     SOCIAL HISTORY: Social History   Socioeconomic History  . Marital status: Single    Spouse name: Not on file  . Number of children: 2  . Years of education: College   . Highest education level: Not on file  Occupational History  .  Occupation: HOUSEKEEPING    Employer: [employer]  Tobacco Use  . Smoking status: Former Smoker    Quit date: 01/23/2000    Years since quitting: 19.5  . Smokeless tobacco: Never Used  Vaping Use  . Vaping Use: Never used  Substance and Sexual Activity  . Alcohol use: Yes    Alcohol/week: 0.0 standard drinks    Comment: occasional  . Drug use: No  . Sexual activity: Yes    Partners: Male    Birth control/protection: None, Surgical  Other Topics Concern  . Not on file  Social History Narrative   Patient lives at home with children.    Patient has 2 children.    Patient has a college degree.    Patient is single.    Patient is right handed.    Patient is currently employed.    Social Determinants of Health   Financial Resource Strain:   . Difficulty of Paying Living Expenses:   Food Insecurity:   . Worried About Charity fundraiser in the Last Year:   . Arboriculturist in the Last Year:   Transportation Needs:   . Film/video editor (Medical):   Marland Kitchen Lack of Transportation (Non-Medical):   Physical Activity:   . Days of Exercise per Week:   . Minutes of Exercise per Session:   Stress:   . Feeling of Stress :   Social Connections:   . Frequency of Communication with Friends and Family:   . Frequency of Social Gatherings with Friends and Family:   . Attends Religious Services:   . Active Member of Clubs or Organizations:   . Attends Archivist Meetings:   Marland Kitchen Marital Status:   Intimate Partner Violence:   . Fear of Current or Ex-Partner:   . Emotionally Abused:   Marland Kitchen Physically Abused:   . Sexually Abused:    PHYSICAL EXAM  Vitals:   07/20/19 0937  BP: 128/78  Pulse: 63  Weight: 139 lb (63 kg)  Height: 5\' 4"  (1.626 m)   Body mass index is 23.86 kg/m.  Generalized: Well developed, in no acute distress   Neurological examination  Mentation: Alert oriented to time, place, history taking. Follows all commands speech and language fluent Cranial  nerve II-XII: Pupils were equal round reactive to light. Extraocular movements were full, visual field were full on confrontational test. Facial sensation and strength were normal. Head turning and shoulder shrug were normal and symmetric. Motor: The motor testing reveals 5 over 5 strength of all 4 extremities. Good symmetric motor tone  is noted throughout, does have slight weakness left shoulder abduction, equal grip strength bilaterally Sensory: Sensory testing is intact to soft touch on all 4 extremities. No evidence of extinction is noted.  Coordination: Cerebellar testing reveals good finger-nose-finger and heel-to-shin bilaterally.  Gait and station: Gait is normal. Tandem gait is normal. Romberg is negative. No drift is seen.  Reflexes: Deep tendon reflexes are symmetric and normal bilaterally.   DIAGNOSTIC DATA (LABS, IMAGING, TESTING) - I reviewed patient records, labs, notes, testing and imaging myself where available.  Lab Results  Component Value Date   WBC 3.9 01/20/2019   HGB 12.7 01/20/2019   HCT 40.0 01/20/2019   MCV 104 (H) 01/20/2019   PLT 230 01/20/2019      Component Value Date/Time   NA 146 (H) 02/18/2019 0941   K 3.9 02/18/2019 0941   CL 109 (H) 02/18/2019 0941   CO2 24 01/20/2019 0908   GLUCOSE 79 02/18/2019 0941   GLUCOSE 80 02/17/2015 1353   BUN 8 02/18/2019 0941   CREATININE 0.69 02/18/2019 0941   CREATININE 0.77 02/17/2015 1353   CALCIUM 9.0 02/18/2019 0941   PROT 6.1 02/18/2019 0941   ALBUMIN 4.2 02/18/2019 0941   AST 28 02/18/2019 0941   ALT 63 (H) 01/20/2019 0908   ALKPHOS 70 02/18/2019 0941   BILITOT 0.4 02/18/2019 0941   GFRNONAA 99 02/18/2019 0941   GFRNONAA >89 02/17/2015 1353   GFRAA 114 02/18/2019 0941   GFRAA >89 02/17/2015 1353   Lab Results  Component Value Date   CHOL 146 02/18/2019   HDL 56 02/18/2019   LDLCALC 73 02/18/2019   TRIG 94 02/18/2019   CHOLHDL 2.6 02/18/2019   Lab Results  Component Value Date   HGBA1C 5.2  02/17/2016   Lab Results  Component Value Date   VITAMINB12 682 02/18/2019   Lab Results  Component Value Date   TSH 0.927 12/19/2016    ASSESSMENT AND PLAN 54 y.o. year old female  has a past medical history of Abnormal Pap smear, Chlamydia, History of chicken pox, HSV infection, Multiple sclerosis ([place]), Neuromuscular disorder ([place]), and Yeast infection. here with:  1.  Relapsing remitting multiple sclerosis  She has remained stable since last seen.  She will remain on Gilenya.  I will check routine blood work.  I will order MRI of the brain with and without contrast.  I am going to refer her to ophthalmology for eye evaluation while on Gilenya.  She will follow-up in 6 months or sooner if needed.  I spent 30 minutes of face-to-face and non-face-to-face time with patient.  This included previsit chart review, lab review, study review, order entry, electronic health record documentation, patient education.  Butler Denmark, AGNP-C, DNP 07/20/2019, 10:14 AM [facility] [address] [place], [place] [address] [phone]

## 2019-07-20 NOTE — Progress Notes (Signed)
I have read the note, and I agree with the clinical assessment and plan.  Kelly Grant Kelly Grant   

## 2019-07-21 ENCOUNTER — Telehealth: Payer: Self-pay | Admitting: Neurology

## 2019-07-21 LAB — COMPREHENSIVE METABOLIC PANEL
ALT: 28 IU/L (ref 0–32)
AST: 32 IU/L (ref 0–40)
Albumin/Globulin Ratio: 2.3 — ABNORMAL HIGH (ref 1.2–2.2)
Albumin: 4.6 g/dL (ref 3.8–4.9)
Alkaline Phosphatase: 74 IU/L (ref 48–121)
BUN/Creatinine Ratio: 19 (ref 9–23)
BUN: 15 mg/dL (ref 6–24)
Bilirubin Total: 0.4 mg/dL (ref 0.0–1.2)
CO2: 27 mmol/L (ref 20–29)
Calcium: 9.6 mg/dL (ref 8.7–10.2)
Chloride: 110 mmol/L — ABNORMAL HIGH (ref 96–106)
Creatinine, Ser: 0.81 mg/dL (ref 0.57–1.00)
GFR calc Af Amer: 95 mL/min/{1.73_m2} (ref >59)
GFR calc non Af Amer: 82 mL/min/{1.73_m2} (ref >59)
Globulin, Total: 2 g/dL (ref 1.5–4.5)
Glucose: 77 mg/dL (ref 65–99)
Potassium: 4 mmol/L (ref 3.5–5.2)
Sodium: 149 mmol/L — ABNORMAL HIGH (ref 134–144)
Total Protein: 6.6 g/dL (ref 6.0–8.5)

## 2019-07-21 LAB — CBC WITH DIFFERENTIAL/PLATELET
Basophils Absolute: 0 10*3/uL (ref 0.0–0.2)
Basos: 0 %
EOS (ABSOLUTE): 0.1 10*3/uL (ref 0.0–0.4)
Eos: 2 %
Hematocrit: 40.9 % (ref 34.0–46.6)
Hemoglobin: 13.9 g/dL (ref 11.1–15.9)
Immature Grans (Abs): 0 10*3/uL (ref 0.0–0.1)
Immature Granulocytes: 0 %
Lymphocytes Absolute: 0.5 10*3/uL — ABNORMAL LOW (ref 0.7–3.1)
Lymphs: 13 %
MCH: 33.1 pg — ABNORMAL HIGH (ref 26.6–33.0)
MCHC: 34 g/dL (ref 31.5–35.7)
MCV: 97 fL (ref 79–97)
Monocytes Absolute: 0.6 10*3/uL (ref 0.1–0.9)
Monocytes: 18 %
Neutrophils Absolute: 2.4 10*3/uL (ref 1.4–7.0)
Neutrophils: 67 %
Platelets: 236 10*3/uL (ref 150–450)
RBC: 4.2 x10E6/uL (ref 3.77–5.28)
RDW: 12.4 % (ref 11.7–15.4)
WBC: 3.6 10*3/uL (ref 3.4–10.8)

## 2019-07-21 NOTE — Telephone Encounter (Signed)
BCBS Auth: [id number] (exp. 07/21/19 to 01/16/20)/medicaid order sent to [facility]. They will obtain the auth for medicaid and reach out to the patient to schedule.

## 2019-07-23 ENCOUNTER — Telehealth: Payer: Self-pay

## 2019-07-23 NOTE — Telephone Encounter (Signed)
Pt was notified of the message below °

## 2019-07-23 NOTE — Telephone Encounter (Signed)
-----   Message from Suzzanne Cloud, NP sent at 07/21/2019 11:04 AM EDT ----- Labs are overall stable on Gilenya. Sodium was elevated, make sure drinking enough water. Any diarrhea to explain sodium levels. Make sure she is seeing her PCP routinely to also follow blood work.

## 2019-08-01 ENCOUNTER — Ambulatory Visit
Admission: RE | Admit: 2019-08-01 | Discharge: 2019-08-01 | Disposition: A | Discharge status: Home / Self Care | Payer: BC Managed Care – PPO | Source: Ambulatory Visit | Attending: Neurology | Admitting: Neurology

## 2019-08-01 ENCOUNTER — Other Ambulatory Visit: Payer: Self-pay

## 2019-08-01 DIAGNOSIS — G35 Multiple sclerosis: Secondary | ICD-10-CM

## 2019-08-01 MED ORDER — GADOBENATE DIMEGLUMINE 529 MG/ML IV SOLN
13.0000 mL | Freq: Once | INTRAVENOUS | Status: AC | PRN
  Administered 2019-08-01: 13 mL via INTRAVENOUS

## 2019-08-05 ENCOUNTER — Telehealth: Payer: Self-pay

## 2019-08-05 NOTE — Telephone Encounter (Signed)
Attempted to call pt, LVM  

## 2019-08-05 NOTE — Telephone Encounter (Signed)
-----   Message from Suzzanne Cloud, NP sent at 08/03/2019  7:45 AM EDT ----- Please call the patient.  MRI of the brain was overall stable compared to previous in June 2018, no new lesions.  IMPRESSION: This MRI of the brain with and without contrast shows the following: 1.   There are multiple T2/FLAIR hyperintense foci in the hemispheres and a couple foci in the brainstem and right middle cerebellar peduncle in a pattern and configuration consistent with chronic demyelinating plaque associated with multiple sclerosis.  None the foci appear to be acute and they did not enhance.  Compared to the MRI dated 07/01/2016, there are no new lesions. 2.   Mild generalized cortical atrophy.   3.   There are no acute findings and there is a normal enhancement pattern.

## 2019-08-09 ENCOUNTER — Other Ambulatory Visit: Payer: Self-pay | Admitting: Internal Medicine

## 2019-08-09 DIAGNOSIS — B009 Herpesviral infection, unspecified: Secondary | ICD-10-CM

## 2019-08-09 NOTE — Telephone Encounter (Signed)
Routing to the correct office/provider

## 2019-08-17 ENCOUNTER — Encounter: Payer: Self-pay | Admitting: Nurse Practitioner

## 2019-08-17 ENCOUNTER — Other Ambulatory Visit: Payer: Self-pay

## 2019-08-17 ENCOUNTER — Ambulatory Visit (INDEPENDENT_AMBULATORY_CARE_PROVIDER_SITE_OTHER): Payer: BC Managed Care – PPO | Admitting: Nurse Practitioner

## 2019-08-17 ENCOUNTER — Other Ambulatory Visit: Payer: Self-pay | Admitting: Family Medicine

## 2019-08-17 VITALS — BP 130/82 | HR 63 | Temp 97.5°F | Resp 16 | Ht 64.0 in | Wt 140.0 lb

## 2019-08-17 DIAGNOSIS — E785 Hyperlipidemia, unspecified: Secondary | ICD-10-CM

## 2019-08-17 DIAGNOSIS — G4489 Other headache syndrome: Secondary | ICD-10-CM

## 2019-08-17 DIAGNOSIS — R8290 Unspecified abnormal findings in urine: Secondary | ICD-10-CM

## 2019-08-17 DIAGNOSIS — Z0000 Encounter for general adult medical examination without abnormal findings: Secondary | ICD-10-CM | POA: Diagnosis not present

## 2019-08-17 LAB — POCT URINALYSIS DIPSTICK
Bilirubin, UA: NEGATIVE
Glucose, UA: NEGATIVE
Ketones, UA: NEGATIVE
Nitrite, UA: NEGATIVE
Protein, UA: NEGATIVE
Spec Grav, UA: >=1.03 — AB (ref 1.010–1.025)
Urobilinogen, UA: 1 E.U./dL
pH, UA: 7 (ref 5.0–8.0)

## 2019-08-17 NOTE — Progress Notes (Signed)
   [address] [place], [place]  [address] Phone:  [phone]   Fax:  [phone]   Established Patient Office Visit  Subjective:    Kelly Grant is here for follow up of elevated cholesterol. Compliance with treatment has been good. She is not on a regular exercise regimen due to her current work schedule . Patient denies muscle pain associated with her medications.  The following portions of the patient's history were reviewed and updated as appropriate: allergies, current medications, past family history, past medical history, past social history, past surgical history and problem list.  Review of Systems Neurological: positive for headaches She has occasional headaches.  She admits that she sleeps meals and does not drink often when she is working.  She denies adequate water intake.   Objective:    BP (!) 130/82 (BP Location: Left Arm, Patient Position: Sitting, Cuff Size: Normal)   Pulse 63   Temp (!) 97.5 F (36.4 C)   Resp 16   Ht 5\' 4"  (1.626 m)   Wt 140 lb (63.5 kg)   SpO2 99%   BMI 24.03 kg/m   General Appearance:    Alert, cooperative, no distress, appears stated age  Head:    Normocephalic, without obvious abnormality, atraumatic  Eyes:    PERRL, conjunctiva/corneas clear, EOM's intact,       Nose:   Nares normal,   Neck:   Supple, symmetrical, trachea midline, no adenopathy;    thyroid:  no enlargement/tenderness/nodules; no carotid   bruit or JVD  Back:     Symmetric, no curvature, ROM normal, no CVA tenderness  Lungs:     Clear to auscultation bilaterally, respirations unlabored  Chest Wall:    No tenderness or deformity   Heart:    Regular rate and rhythm, S1 and S2 normal, no murmur, rub   or gallop  Abdomen:     Soft, non-tender, bowel sounds active all four quadrants,    no masses, no organomegaly  Extremities:   Extremities normal, atraumatic, no cyanosis or edema  Pulses:   2+ and symmetric all extremities    Skin:   Skin color, texture, turgor normal, no rashes or lesions  Neurologic:   CNII-XII intact, normal strength,         Lab Review Lab Results  Component Value Date   CHOL 146 02/18/2019   CHOL 231 (H) 02/19/2018   CHOL 236 (H) 08/16/2017   HDL 56 02/18/2019   HDL 73 02/19/2018   HDL 66 08/16/2017      Assessment:    Dyslipidemia under good control.   Assessment and plan  Primary Diagnosis & Pertinent Problem List: The primary encounter diagnosis was Hyperlipidemia, unspecified hyperlipidemia type. Diagnoses of Healthcare maintenance, Other headache syndrome, and Abnormal urine finding were also pertinent to this visit.  Visit Diagnosis: 1. Hyperlipidemia, unspecified hyperlipidemia type  1.Continue dietary measures.  2. Continue regular exercise. 3. Lipid-lowering medications: Rosuvastatin 10 mg nightly Omega-3 1 g daily  2. Healthcare maintenance   3. Other headache syndrome Encourage patient to speak with neurology to see if multiple sclerosis medication may need adjusting due to frequent headaches after taking the recommended of regular daily meals and  Increase water intake We will continue to monitor symptoms persist or get worse  4. Abnormal urine finding Urine culture pending     Follow up in 6 months.

## 2019-08-17 NOTE — Patient Instructions (Signed)
General Headache Without Cause A headache is pain or discomfort that is felt around the head or neck area. There are many causes and types of headaches. In some cases, the cause may not be found. Follow these instructions at home: Watch your condition for any changes. Let your doctor know about them. Take these steps to help with your condition: Managing pain      Take over-the-counter and prescription medicines only as told by your doctor.  Lie down in a dark, quiet room when you have a headache.  If told, put ice on your head and neck area: ? Put ice in a plastic bag. ? Place a towel between your skin and the bag. ? Leave the ice on for 20 minutes, 2-3 times per day.  If told, put heat on the affected area. Use the heat source that your doctor recommends, such as a moist heat pack or a heating pad. ? Place a towel between your skin and the heat source. ? Leave the heat on for 20-30 minutes. ? Remove the heat if your skin turns bright red. This is very important if you are unable to feel pain, heat, or cold. You may have a greater risk of getting burned.  Keep lights dim if bright lights bother you or make your headaches worse. Eating and drinking  Eat meals on a regular schedule.  If you drink alcohol: ? Limit how much you use to:  0-1 drink a day for women.  0-2 drinks a day for men. ? Be aware of how much alcohol is in your drink. In the [place] one drink equals one 12 oz bottle of beer (355 mL), one 5 oz glass of wine (148 mL), or one 1 oz glass of hard liquor (44 mL).  Stop drinking caffeine, or reduce how much caffeine you drink. General instructions   Keep a journal to find out if certain things bring on headaches. For example, write down: ? What you eat and drink. ? How much sleep you get. ? Any change to your diet or medicines.  Get a massage or try other ways to relax.  Limit stress.  Sit up straight. Do not tighten (tense) your muscles.  Do not use any  products that contain nicotine or tobacco. This includes cigarettes, e-cigarettes, and chewing tobacco. If you need help quitting, ask your doctor.  Exercise regularly as told by your doctor.  Get enough sleep. This often means 7-9 hours of sleep each night.  Keep all follow-up visits as told by your doctor. This is important. Contact a doctor if:  Your symptoms are not helped by medicine.  You have a headache that feels different than the other headaches.  You feel sick to your stomach (nauseous) or you throw up (vomit).  You have a fever. Get help right away if:  Your headache gets very bad quickly.  Your headache gets worse after a lot of physical activity.  You keep throwing up.  You have a stiff neck.  You have trouble seeing.  You have trouble speaking.  You have pain in the eye or ear.  Your muscles are weak or you lose muscle control.  You lose your balance or have trouble walking.  You feel like you will pass out (faint) or you pass out.  You are mixed up (confused).  You have a seizure. Summary  A headache is pain or discomfort that is felt around the head or neck area.  There are many causes and   types of headaches. In some cases, the cause may not be found.  Keep a journal to help find out what causes your headaches. Watch your condition for any changes. Let your doctor know about them.  Contact a doctor if you have a headache that is different from usual, or if your headache is not helped by medicine.  Get help right away if your headache gets very bad, you throw up, you have trouble seeing, you lose your balance, or you have a seizure. This information is not intended to replace advice given to you by your health care provider. Make sure you discuss any questions you have with your health care provider. Document Revised: 07/29/2017 Document Reviewed: 07/29/2017 Elsevier Patient Education  2020 Elsevier Inc.  

## 2019-08-18 LAB — LIPID PANEL
Chol/HDL Ratio: 2.3 ratio (ref 0.0–4.4)
Cholesterol, Total: 161 mg/dL (ref 100–199)
HDL: 70 mg/dL (ref >39)
LDL Chol Calc (NIH): 80 mg/dL (ref 0–99)
Triglycerides: 53 mg/dL (ref 0–149)
VLDL Cholesterol Cal: 11 mg/dL (ref 5–40)

## 2019-08-18 LAB — HEPATITIS C ANTIBODY: Hep C Virus Ab: <0.1 s/co ratio (ref 0.0–0.9)

## 2019-08-19 ENCOUNTER — Other Ambulatory Visit: Payer: Self-pay | Admitting: Nurse Practitioner

## 2019-08-19 MED ORDER — SULFAMETHOXAZOLE-TRIMETHOPRIM 800-160 MG PO TABS: 1.0000 | ORAL_TABLET | Freq: Two times a day (BID) | ORAL | 0 refills | Status: AC

## 2019-08-21 LAB — URINE CULTURE

## 2019-08-24 DIAGNOSIS — H2513 Age-related nuclear cataract, bilateral: Secondary | ICD-10-CM | POA: Diagnosis not present

## 2019-08-24 DIAGNOSIS — Z79899 Other long term (current) drug therapy: Secondary | ICD-10-CM | POA: Diagnosis not present

## 2019-08-24 DIAGNOSIS — H04123 Dry eye syndrome of bilateral lacrimal glands: Secondary | ICD-10-CM | POA: Diagnosis not present

## 2019-08-24 DIAGNOSIS — G35 Multiple sclerosis: Secondary | ICD-10-CM | POA: Diagnosis not present

## 2019-09-02 ENCOUNTER — Telehealth: Payer: Self-pay

## 2019-09-02 NOTE — Telephone Encounter (Signed)
Practice administrator informed me of this patient's abnormal pap smear result from 03/2019 and need to colpo procedure according to the providers results note. I called pt to inform her of the result and need for colpo procedure, pt voices understanding. Call transferred up front to schedule.

## 2019-09-06 ENCOUNTER — Other Ambulatory Visit: Payer: Self-pay | Admitting: Family Medicine

## 2019-09-06 DIAGNOSIS — E785 Hyperlipidemia, unspecified: Secondary | ICD-10-CM

## 2019-09-07 ENCOUNTER — Telehealth: Payer: Self-pay | Admitting: Neurology

## 2019-09-07 NOTE — Telephone Encounter (Signed)
Pt has called to report that a PA is needed on her Fingolimod HCl (GILENYA) 0.5 MG CAPS this is thru [facility] [place]

## 2019-09-08 NOTE — Telephone Encounter (Signed)
Initiated CMM KEY # [id number] for gilenya 0.5mg  po daily. Only other therapy per pt is copaxone.  Determination pending.

## 2019-09-09 ENCOUNTER — Other Ambulatory Visit: Payer: Self-pay

## 2019-09-09 DIAGNOSIS — E785 Hyperlipidemia, unspecified: Secondary | ICD-10-CM

## 2019-09-09 MED ORDER — OMEGA-3-ACID ETHYL ESTERS 1 G PO CAPS: 1.0000 g | ORAL_CAPSULE | Freq: Every day | ORAL | 3 refills | Status: DC

## 2019-09-14 ENCOUNTER — Telehealth: Payer: Self-pay | Admitting: Neurology

## 2019-09-14 MED ORDER — GILENYA 0.5 MG PO CAPS: ORAL_CAPSULE | ORAL | 3 refills | Status: DC

## 2019-09-14 NOTE — Telephone Encounter (Signed)
Pt updated pharmacy to  [place] for refill Fingolimod HCl (GILENYA) 0.5 MG CAPS. File on Insurance BCBS for Fingolimod HCl (GILENYA) 0.5 MG CAPS

## 2019-09-14 NOTE — Addendum Note (Signed)
Addended by: Brandon Melnick on: 09/14/2019 12:11 PM   Modules accepted: Orders

## 2019-09-14 NOTE — Telephone Encounter (Signed)
This was done.

## 2019-09-14 NOTE — Telephone Encounter (Signed)
Received approval for Gilenya BCBS 09-08-19 thru 09-06-2020  REF# [id number].  [phone].  Fax confiramtion received for [facility].

## 2019-09-15 ENCOUNTER — Telehealth: Payer: Self-pay | Admitting: *Deleted

## 2019-09-15 NOTE — Telephone Encounter (Addendum)
I received notification there was an issue with the Gilenya PA. Per earlier documentation, there is an approval on file through 09/06/2020. I called [employer] at [phone] and spoke to agent Paris B. She was able to see that the problem has been resolved and the pharmacy was able to get a paid claim today.

## 2019-09-24 ENCOUNTER — Ambulatory Visit (INDEPENDENT_AMBULATORY_CARE_PROVIDER_SITE_OTHER): Payer: BC Managed Care – PPO | Admitting: Obstetrics & Gynecology

## 2019-09-24 ENCOUNTER — Other Ambulatory Visit: Payer: Self-pay

## 2019-09-24 ENCOUNTER — Encounter: Payer: Self-pay | Admitting: Obstetrics & Gynecology

## 2019-09-24 VITALS — BP 115/73 | HR 73 | Ht 64.0 in | Wt 142.1 lb

## 2019-09-24 DIAGNOSIS — R87611 Atypical squamous cells cannot exclude high grade squamous intraepithelial lesion on cytologic smear of cervix (ASC-H): Secondary | ICD-10-CM

## 2019-09-24 MED ORDER — MISOPROSTOL 200 MCG PO TABS: ORAL_TABLET | ORAL | 2 refills | Status: DC

## 2019-09-24 NOTE — Progress Notes (Signed)
Patient ID: Kelly Grant, female   DOB: 07/29/1965, 54 y.o.   MRN: [id number]  Chief Complaint  Patient presents with   Colposcopy    HPI Kelly Grant is a 54 y.o. female.  [id number]  HPI  Indications: Pap smear on June 2021 showed: ASC cannot exclude high grade lesion [facility]). Previous colposcopy: CIN 1 and in 2020. Prior cervical treatment: cryosurgery.  Past Medical History:  Diagnosis Date   Abnormal Pap smear    ASC-H   Chlamydia    History of chicken pox    HSV infection    Multiple sclerosis ([place])    Neuromuscular disorder ([place])    MS   Yeast infection     Past Surgical History:  Procedure Laterality Date   CARPAL TUNNEL RELEASE     CESAREAN SECTION     x2   INCISION AND DRAINAGE ABSCESS     left buttock   TUBAL LIGATION      Family History  Problem Relation Age of Onset   Kidney failure Mother    Diabetes Mother    Colon cancer Neg Hx     Social History Social History   Tobacco Use   Smoking status: Former Smoker    Quit date: 01/23/2000    Years since quitting: 19.6   Smokeless tobacco: Never Used  Scientific laboratory technician Use: Never used  Substance Use Topics   Alcohol use: Yes    Alcohol/week: 0.0 standard drinks    Comment: occasional   Drug use: No    Allergies  Allergen Reactions   Macrobid [Nitrofurantoin Macrocrystal] Hives    Current Outpatient Medications  Medication Sig Dispense Refill   aspirin EC 81 MG tablet Take 1 tablet (81 mg total) by mouth daily. 30 tablet 11   calcium-vitamin D (OSCAL WITH D) 500-200 MG-UNIT tablet Take 2 tablets by mouth.      carboxymethylcellulose (REFRESH PLUS) 0.5 % SOLN 1 drop daily as needed.     cycloSPORINE (RESTASIS) 0.05 % ophthalmic emulsion 1 drop 2 (two) times daily.     diclofenac sodium (VOLTAREN) 1 % GEL Apply topically as needed.     Fingolimod HCl (GILENYA) 0.5 MG CAPS Take 1 daily 90 capsule 3   Multiple Vitamin (MULTIVITAMIN) tablet Take 1 tablet by mouth  daily. 30 tablet 3   omega-3 acid ethyl esters (LOVAZA) 1 g capsule TAKE 1 CAPSULE BY MOUTH ONCE DAILY 90 capsule 3   rosuvastatin (CRESTOR) 10 MG tablet TAKE 1 TABLET(10 MG) BY MOUTH AT BEDTIME 90 tablet 3   valACYclovir (VALTREX) 500 MG tablet TAKE 1 TABLET(500 MG) BY MOUTH DAILY 90 tablet 0   Wheat Dextrin (BENEFIBER DRINK MIX PO) Take by mouth. 2 tsp TID     misoprostol (CYTOTEC) 200 MCG tablet Place two tablets in the vagina two and one night prior to your next clinic appointment 4 tablet 2   No current facility-administered medications for this visit.    Review of Systems Review of Systems  Blood pressure 115/73, pulse 73, height 5\' 4"  (1.626 m), weight 142 lb 1.6 oz (64.5 kg).  Physical Exam Physical Exam  Data Reviewed Pap and bx results  Assessment    Procedure Details  The risks and benefits of the procedure and Written informed consent obtained.  Speculum placed in vagina and excellent visualization of cervix achieved, cervix swabbed x 3 with acetic acid solution.  Specimens: no  Complications: none.    Patient given informed consent, signed copy in  the chart, time out was performed.  Placed in lithotomy position. Cervix viewed with speculum and colposcope after application of acetic acid.   Colposcopy adequate?  no Acetowhite lesions? no Punctation? no Mosaicism?  no Abnormal vasculature?  no Biopsies? no ECC? no Cervix is stenotic and needs dilation COMMENTS:    Plan    Cytotec X2 prior to next exam, colpscopy      Emeterio Reeve 09/24/2019, 4:53 PM

## 2019-09-24 NOTE — Progress Notes (Signed)
Pt presents for colposcopy biospy s/p pap smear 03/27/2019 ASCUS HR HPV

## 2019-09-24 NOTE — Patient Instructions (Signed)
Colposcopy Colposcopy is a procedure to examine the lowest part of the uterus (cervix), the vagina, and the area around the vaginal opening (vulva) for abnormalities or signs of disease. The procedure is done using a lighted microscope or magnifying lens (colposcope). If any unusual cells are found during the procedure, your health care provider may remove a tissue sample for testing (biopsy). A colposcopy may be done if you:  Have an abnormal Pap test. A Pap test is a screening test that is used to check for signs of cancer or infection of the vagina, cervix, and uterus.  Have a Pap smear test in which you test positive for high-risk HPV (human papillomavirus).  Have a sore or lesion on your cervix.  Have genital warts on your vulva, vagina, or cervix.  Took certain medicines while pregnant, such as diethylstilbestrol (DES).  Have pain during sexual intercourse.  Have vaginal bleeding, especially after sexual intercourse.  Need to have a cervical polyp removed.  Need to have a lost intrauterine device (IUD) string located. Let your health care provider know about:  Any allergies you have, including allergies to prescribed medicine, latex, or iodine.  All medicines you are taking, including vitamins, herbs, eye drops, creams, and over-the-counter medicines. Bring a list of all of your medicines to your appointment.  Any problems you or family members have had with anesthetic medicines.  Any blood disorders you have.  Any surgeries you have had.  Any medical conditions you have, such as pelvic inflammatory disease (PID) or endometrial disorder.  Any history of frequent fainting.  Your menstrual cycle and what form of birth control (contraception) you use.  Your medical history, including any prior cervical treatment.  Whether you are pregnant or may be pregnant. What are the risks? Generally, this is a safe procedure. However, problems may occur,  including:  Pain.  Infection, which may include a fever, bad-smelling discharge, or pelvic pain.  Bleeding or discharge.  Misdiagnosis.  Fainting and vasovagal reactions, but this is rare.  Allergic reactions to medicines.  Damage to other structures or organs. What happens before the procedure?  If you have your menstrual period or will have it at the time of your procedure, tell your health care provider. A colposcopy typically is not done during menstruation.  Continue your contraceptive practices before and after the procedure.  For 24 hours before the colposcopy: ? Do not douche. ? Do not use tampons. ? Do not use medicines, creams, or suppositories in the vagina. ? Do not have sexual intercourse.  Ask your health care provider about: ? Changing or stopping your regular medicines. This is especially important if you are taking diabetes medicines or blood thinners. ? Taking medicines such as aspirin and ibuprofen. These medicines can thin your blood. Do not take these medicines before your procedure if your health care provider instructs you not to. It is likely that your health care provider will tell you to avoid taking aspirin or medicine that contains aspirin for 7 days before the procedure.  Follow instructions from your health care provider about eating or drinking restrictions. You will likely need to eat a regular diet the day of the procedure and not skip any meals.  You may have an exam or testing. A pregnancy test will be taken on the day of the procedure.  You may have a blood or urine sample taken.  Plan to have someone take you home from the hospital or clinic.  If you will be going  home right after the procedure, plan to have someone with you for 24 hours. What happens during the procedure?  You will lie down on your back, with your feet in foot rests (stirrups).  A warmed and lubricated instrument (speculum) will be inserted into your vagina. The  speculum will be used to hold apart the walls of your vagina so your health care provider can see your cervix and the inside of your vagina.  A cotton swab will be used to place a small amount of liquid solution on the areas to be examined. This solution makes it easier to see abnormal cells. You may feel a slight burning during this part.  The colposcope will be used to scan the cervix with a bright white light. The colposcope will be held near your vulvaand will magnify your vulva, vagina, and cervix for easier examination.  Your health care provider may decide to take a biopsy. If so: ? You may be given medicine to numb the area (local anesthetic). ? Surgical instruments will be used to suck out mucus and cells through your vagina. ? You may feel mild pain while the tissue sample is removed. ? Bleeding may occur. A solution may be used to stop the bleeding. ? If a sample of tissue is needed from the inside of the cervix, a different procedure called endocervical curettage (ECC) may be completed. During this procedure, a curved instrument (curette) will be used to scrape cells from your cervix or the top of your cervix (endocervix).  Your health care provider will record the location of any abnormalities. The procedure may vary among health care providers and hospitals. What happens after the procedure?  You will lie down and rest for a few minutes. You may be offered juice or cookies.  Your blood pressure, heart rate, breathing rate, and blood oxygen level will be monitored until any medicines you were given have worn off.  You may have to wear compression stockings. These stockings help to prevent blood clots and reduce swelling in your legs.  You may have some cramping in your abdomen. This should go away after a few minutes. This information is not intended to replace advice given to you by your health care provider. Make sure you discuss any questions you have with your health care  provider. Document Revised: 12/21/2016 Document Reviewed: 08/15/2015 Elsevier Patient Education  2020 Elsevier Inc.  

## 2019-10-12 ENCOUNTER — Other Ambulatory Visit: Payer: Self-pay

## 2019-10-12 ENCOUNTER — Ambulatory Visit (INDEPENDENT_AMBULATORY_CARE_PROVIDER_SITE_OTHER): Payer: BC Managed Care – PPO | Admitting: Obstetrics & Gynecology

## 2019-10-12 ENCOUNTER — Other Ambulatory Visit (HOSPITAL_COMMUNITY)
Admission: RE | Admit: 2019-10-12 | Discharge: 2019-10-12 | Disposition: A | Discharge status: Home / Self Care | Payer: BC Managed Care – PPO | Source: Ambulatory Visit | Attending: Obstetrics & Gynecology | Admitting: Obstetrics & Gynecology

## 2019-10-12 ENCOUNTER — Encounter: Payer: Self-pay | Admitting: Obstetrics & Gynecology

## 2019-10-12 VITALS — BP 120/78 | HR 80 | Wt 142.0 lb

## 2019-10-12 DIAGNOSIS — R87611 Atypical squamous cells cannot exclude high grade squamous intraepithelial lesion on cytologic smear of cervix (ASC-H): Secondary | ICD-10-CM | POA: Diagnosis not present

## 2019-10-12 DIAGNOSIS — N870 Mild cervical dysplasia: Secondary | ICD-10-CM | POA: Diagnosis not present

## 2019-10-12 LAB — POCT URINE PREGNANCY: Preg Test, Ur: NEGATIVE

## 2019-10-12 NOTE — Patient Instructions (Signed)
Colposcopy, Care After This sheet gives you information about how to care for yourself after your procedure. Your doctor may also give you more specific instructions. If you have problems or questions, contact your doctor. What can I expect after the procedure? If you did not have a tissue sample removed (did not have a biopsy), you may only have some spotting for a few days. You can go back to your normal activities. If you had a tissue sample removed, it is common to have:  Soreness and pain. This may last for a few days.  Light-headedness.  Mild bleeding from your vagina or dark-colored, grainy discharge from your vagina. This may last for a few days. You may need to wear a sanitary pad.  Spotting for at least 48 hours after the procedure. Follow these instructions at home:   Take over-the-counter and prescription medicines only as told by your doctor. Ask your doctor what medicines you can start taking again. This is very important if you take blood-thinning medicine.  Do not drive or use heavy machinery while taking prescription pain medicine.  For 3 days, or as long as your doctor tells you, avoid: ? Douching. ? Using tampons. ? Having sex.  If you use birth control (contraception), keep using it.  Limit activity for the first day after the procedure. Ask your doctor what activities are safe for you.  It is up to you to get the results of your procedure. Ask your doctor when your results will be ready.  Keep all follow-up visits as told by your doctor. This is important. Contact a doctor if:  You get a skin rash. Get help right away if:  You are bleeding a lot from your vagina. It is a lot of bleeding if you are using more than one pad an hour for 2 hours in a row.  You have clumps of blood (blood clots) coming from your vagina.  You have a fever.  You have chills  You have pain in your lower belly (pelvic area).  You have signs of infection, such as vaginal  discharge that is: ? Different than usual. ? Yellow. ? Bad-smelling.  You have very pain or cramps in your lower belly that do not get better with medicine.  You feel light-headed.  You feel dizzy.  You pass out (faint). Summary  If you did not have a tissue sample removed (did not have a biopsy), you may only have some spotting for a few days. You can go back to your normal activities.  If you had a tissue sample removed, it is common to have mild pain and spotting for 48 hours.  For 3 days, or as long as your doctor tells you, avoid douching, using tampons and having sex.  Get help right away if you have bleeding, very bad pain, or signs of infection. This information is not intended to replace advice given to you by your health care provider. Make sure you discuss any questions you have with your health care provider. Document Revised: 12/21/2016 Document Reviewed: 09/28/2015 Elsevier Patient Education  2020 Elsevier Inc.  

## 2019-10-12 NOTE — Progress Notes (Signed)
Pt presents for colposcopy biopsy  S/p ASC-H HPV HR on 03/27/2019.

## 2019-10-12 NOTE — Progress Notes (Signed)
Patient ID: Kelly Grant, female   DOB: 08-Oct-1965, 54 y.o.   MRN: [id number]  Chief Complaint  Patient presents with  . Colposcopy    HPI Kelly Grant is a 54 y.o. female.  Patient returns for colposcopy after application of vaginal Cytotec last evening HPI  Indications: Pap smear on March 2020 showed: ASC cannot exclude high grade lesion [facility]). Previous colposcopy: CIN 1. Prior cervical treatment: cryosurgery.  Past Medical History:  Diagnosis Date  . Abnormal Pap smear    ASC-H  . Chlamydia   . History of chicken pox   . HSV infection   . Multiple sclerosis ([place])   . Neuromuscular disorder ([place])    MS  . Yeast infection     Past Surgical History:  Procedure Laterality Date  . CARPAL TUNNEL RELEASE    . CESAREAN SECTION     x2  . INCISION AND DRAINAGE ABSCESS     left buttock  . TUBAL LIGATION      Family History  Problem Relation Age of Onset  . Kidney failure Mother   . Diabetes Mother   . Colon cancer Neg Hx     Social History Social History   Tobacco Use  . Smoking status: Former Smoker    Quit date: 01/23/2000    Years since quitting: 19.7  . Smokeless tobacco: Never Used  Vaping Use  . Vaping Use: Never used  Substance Use Topics  . Alcohol use: Yes    Alcohol/week: 0.0 standard drinks    Comment: occasional  . Drug use: No    Allergies  Allergen Reactions  . Macrobid [Nitrofurantoin Macrocrystal] Hives    Current Outpatient Medications  Medication Sig Dispense Refill  . aspirin EC 81 MG tablet Take 1 tablet (81 mg total) by mouth daily. 30 tablet 11  . calcium-vitamin D (OSCAL WITH D) 500-200 MG-UNIT tablet Take 2 tablets by mouth.     . carboxymethylcellulose (REFRESH PLUS) 0.5 % SOLN 1 drop daily as needed.    . cycloSPORINE (RESTASIS) 0.05 % ophthalmic emulsion 1 drop 2 (two) times daily.    . diclofenac sodium (VOLTAREN) 1 % GEL Apply topically as needed.    . Fingolimod HCl (GILENYA) 0.5 MG CAPS Take 1 daily 90 capsule 3  .  Multiple Vitamin (MULTIVITAMIN) tablet Take 1 tablet by mouth daily. 30 tablet 3  . omega-3 acid ethyl esters (LOVAZA) 1 g capsule TAKE 1 CAPSULE BY MOUTH ONCE DAILY 90 capsule 3  . rosuvastatin (CRESTOR) 10 MG tablet TAKE 1 TABLET(10 MG) BY MOUTH AT BEDTIME 90 tablet 3  . valACYclovir (VALTREX) 500 MG tablet TAKE 1 TABLET(500 MG) BY MOUTH DAILY 90 tablet 0  . Wheat Dextrin (BENEFIBER DRINK MIX PO) Take by mouth. 2 tsp TID    . misoprostol (CYTOTEC) 200 MCG tablet Place two tablets in the vagina two and one night prior to your next clinic appointment (Patient not taking: Reported on 10/12/2019) 4 tablet 2   No current facility-administered medications for this visit.    Review of Systems Review of Systems  Blood pressure 120/78, pulse 80, weight 142 lb (64.4 kg).  Physical Exam Physical Exam  Data Reviewed Pap result and office note  Assessment    Procedure Details  The risks and benefits of the procedure and Written informed consent obtained.  Speculum placed in vagina and excellent visualization of cervix achieved, cervix swabbed x 3 with acetic acid solution. Fragments of tablet in the vagina Cervix stenotic and unable  to dilate  Patient given informed consent, signed copy in the chart, time out was performed.  Placed in lithotomy position. Cervix viewed with speculum and colposcope after application of acetic acid.   Colposcopy adequate?  no [place]et[place]hite lesions? no Punctation? no Mosaicism?  no Abnormal vasculature?  no Biopsies? yes ECC? no  COMMENTS:  Patient was given post procedure instructions.  She will return in 6 months for repeat pap, suspect bx will be benign.  Specimens: bx 1:14, no ECC  Complications: none.     Plan    Specimens labelled and sent to Pathology. Return to discuss Pathology results in 2 weeks.      Emeterio Reeve 10/12/2019, 2:27 PM

## 2019-10-14 LAB — SURGICAL PATHOLOGY

## 2019-11-09 ENCOUNTER — Other Ambulatory Visit: Payer: Self-pay | Admitting: Nurse Practitioner

## 2019-11-09 DIAGNOSIS — Z1231 Encounter for screening mammogram for malignant neoplasm of breast: Secondary | ICD-10-CM | POA: Diagnosis not present

## 2019-11-09 DIAGNOSIS — B009 Herpesviral infection, unspecified: Secondary | ICD-10-CM

## 2019-11-09 NOTE — Telephone Encounter (Signed)
Please see medication request.

## 2020-01-19 ENCOUNTER — Ambulatory Visit (INDEPENDENT_AMBULATORY_CARE_PROVIDER_SITE_OTHER): Payer: BC Managed Care – PPO | Admitting: Neurology

## 2020-01-19 ENCOUNTER — Encounter: Payer: Self-pay | Admitting: Neurology

## 2020-01-19 VITALS — BP 118/74 | HR 74 | Ht 64.0 in | Wt 150.0 lb

## 2020-01-19 DIAGNOSIS — G35 Multiple sclerosis: Secondary | ICD-10-CM

## 2020-01-19 NOTE — Progress Notes (Signed)
PATIENT: Kelly Grant DOB: 03/09/1965  REASON FOR VISIT: follow up HISTORY FROM: patient  HISTORY OF PRESENT ILLNESS: Today 01/19/20 Kelly Grant is a 54 year old female with history of relapsing remitting multiple sclerosis.  She is on Gilenya.  MRI of the brain in July 2021 was overall stable compared to previous in June 2018, no new lesions.  MS remains overall stable, has chronic weakness to the left arm with prolonged carrying.  Continues to work full-time for [employer], goes in around 6 pm, gets off at 3 am.  Know she does not drink enough water.  She did see Dr. Katy Fitch, seeing again next month to check on glaucoma.  No falls.  No new problems or concerns.  Presents today for evaluation unaccompanied.  HISTORY 07/20/2019 SS: Kelly Grant is a 54 year old female with history of relapsing remitting multiple sclerosis.  She remains on Gilenya.  She denies any new or worsening MS symptoms.  Feels her MS is overall stable.  She does have chronic weakness to the left arm.  She continues to work full-time on [employer], she is a Librarian, academic.  She does her job well. She works 6 days a week. Did have eye exam done at [facility] in January, isn't sure how thorough it was.  Presents today for evaluation unaccompanied.   REVIEW OF SYSTEMS: Out of a complete 14 system review of symptoms, the patient complains only of the following symptoms, and all other reviewed systems are negative.  N/A  ALLERGIES: Allergies  Allergen Reactions  . Macrobid [Nitrofurantoin Macrocrystal] Hives    HOME MEDICATIONS: Outpatient Medications Prior to Visit  Medication Sig Dispense Refill  . aspirin EC 81 MG tablet Take 1 tablet (81 mg total) by mouth daily. 30 tablet 11  . calcium-vitamin D (OSCAL WITH D) 500-200 MG-UNIT tablet Take 2 tablets by mouth.     . carboxymethylcellulose (REFRESH PLUS) 0.5 % SOLN 1 drop daily as needed.    . cycloSPORINE (RESTASIS) 0.05 % ophthalmic emulsion 1 drop 2  (two) times daily.    . diclofenac sodium (VOLTAREN) 1 % GEL Apply topically as needed.    . Fingolimod HCl (GILENYA) 0.5 MG CAPS Take 1 daily 90 capsule 3  . Multiple Vitamin (MULTIVITAMIN) tablet Take 1 tablet by mouth daily. 30 tablet 3  . omega-3 acid ethyl esters (LOVAZA) 1 g capsule TAKE 1 CAPSULE BY MOUTH ONCE DAILY 90 capsule 3  . rosuvastatin (CRESTOR) 10 MG tablet TAKE 1 TABLET(10 MG) BY MOUTH AT BEDTIME 90 tablet 3  . valACYclovir (VALTREX) 500 MG tablet TAKE 1 TABLET(500 MG) BY MOUTH DAILY 90 tablet 0  . Wheat Dextrin (BENEFIBER DRINK MIX PO) Take by mouth. 2 tsp TID    . misoprostol (CYTOTEC) 200 MCG tablet Place two tablets in the vagina two and one night prior to your next clinic appointment (Patient not taking: Reported on 10/12/2019) 4 tablet 2   No facility-administered medications prior to visit.    PAST MEDICAL HISTORY: Past Medical History:  Diagnosis Date  . Abnormal Pap smear    ASC-H  . Chlamydia   . History of chicken pox   . HSV infection   . Multiple sclerosis ([place])   . Neuromuscular disorder ([place])    MS  . Yeast infection     PAST SURGICAL HISTORY: Past Surgical History:  Procedure Laterality Date  . CARPAL TUNNEL RELEASE    . CESAREAN SECTION     x2  . INCISION AND  DRAINAGE ABSCESS     left buttock  . TUBAL LIGATION      FAMILY HISTORY: Family History  Problem Relation Age of Onset  . Kidney failure Mother   . Diabetes Mother   . Colon cancer Neg Hx     SOCIAL HISTORY: Social History   Socioeconomic History  . Marital status: Single    Spouse name: Not on file  . Number of children: 2  . Years of education: College   . Highest education level: Not on file  Occupational History  . Occupation: HOUSEKEEPING    Employer: [employer]  Tobacco Use  . Smoking status: Former Smoker    Quit date: 01/23/2000    Years since quitting: 20.0  . Smokeless tobacco: Never Used  Vaping Use  . Vaping Use: Never used  Substance and Sexual  Activity  . Alcohol use: Yes    Alcohol/week: 0.0 standard drinks    Comment: occasional  . Drug use: No  . Sexual activity: Yes    Partners: Male    Birth control/protection: None, Surgical  Other Topics Concern  . Not on file  Social History Narrative   Patient lives at home with children.    Patient has 2 children.    Patient has a college degree.    Patient is single.    Patient is right handed.    Patient is currently employed.    Social Determinants of Health   Financial Resource Strain: Not on file  Food Insecurity: Not on file  Transportation Needs: Not on file  Physical Activity: Not on file  Stress: Not on file  Social Connections: Not on file  Intimate Partner Violence: Not on file   PHYSICAL EXAM  Vitals:   01/19/20 1017  BP: 118/74  Pulse: 74  Weight: 150 lb (68 kg)  Height: 5\' 4"  (1.626 m)   Body mass index is 25.75 kg/m.  Generalized: Well developed, in no acute distress   Neurological examination  Mentation: Alert oriented to time, place, history taking. Follows all commands speech and language fluent Cranial nerve II-XII: Pupils were equal round reactive to light. Extraocular movements were full, visual field were full on confrontational test. Facial sensation and strength were normal. Head turning and shoulder shrug were normal and symmetric. Motor: The motor testing reveals 5 over 5 strength of all 4 extremities. Good symmetric motor tone is noted throughout.  Grip strength equal bilaterally. Sensory: Sensory testing is intact to soft touch on all 4 extremities. No evidence of extinction is noted.  Coordination: Cerebellar testing reveals good finger-nose-finger and heel-to-shin bilaterally.  Gait and station: Gait is normal. Tandem gait is normal. Romberg is negative. No drift is seen.  Reflexes: Deep tendon reflexes are symmetric and normal bilaterally.   DIAGNOSTIC DATA (LABS, IMAGING, TESTING) - I reviewed patient records, labs, notes,  testing and imaging myself where available.  Lab Results  Component Value Date   WBC 3.6 07/20/2019   HGB 13.9 07/20/2019   HCT 40.9 07/20/2019   MCV 97 07/20/2019   PLT 236 07/20/2019      Component Value Date/Time   NA 149 (H) 07/20/2019 1021   K 4.0 07/20/2019 1021   CL 110 (H) 07/20/2019 1021   CO2 27 07/20/2019 1021   GLUCOSE 77 07/20/2019 1021   GLUCOSE 80 02/17/2015 1353   BUN 15 07/20/2019 1021   CREATININE 0.81 07/20/2019 1021   CREATININE 0.77 02/17/2015 1353   CALCIUM 9.6 07/20/2019 1021   PROT 6.6  07/20/2019 1021   ALBUMIN 4.6 07/20/2019 1021   AST 32 07/20/2019 1021   ALT 28 07/20/2019 1021   ALKPHOS 74 07/20/2019 1021   BILITOT 0.4 07/20/2019 1021   GFRNONAA 82 07/20/2019 1021   GFRNONAA >89 02/17/2015 1353   GFRAA 95 07/20/2019 1021   GFRAA >89 02/17/2015 1353   Lab Results  Component Value Date   CHOL 161 08/17/2019   HDL 70 08/17/2019   LDLCALC 80 08/17/2019   TRIG 53 08/17/2019   CHOLHDL 2.3 08/17/2019   Lab Results  Component Value Date   HGBA1C 5.2 02/17/2016   Lab Results  Component Value Date   VITAMINB12 682 02/18/2019   Lab Results  Component Value Date   TSH 0.927 12/19/2016    ASSESSMENT AND PLAN 54 y.o. year old female  has a past medical history of Abnormal Pap smear, Chlamydia, History of chicken pox, HSV infection, Multiple sclerosis (HCC), Neuromuscular disorder (HCC), and Yeast infection. here with:  1.  Relapsing remitting multiple sclerosis  Has remained overall stable.  She will remain on Gilenya.  We will check routine blood work today.  MRI of the brain in July 2021 was stable.  She will follow-up in 6 months or sooner if needed.  I spent 20 minutes of face-to-face and non-face-to-face time with patient.  This included previsit chart review, lab review, study review, order entry, electronic health record documentation, patient education.  Margie Ege, AGNP-C, DNP 01/19/2020, 10:40 AM [facility] [address], [address] [place], [place] [address] [phone]

## 2020-01-19 NOTE — Patient Instructions (Signed)
Check labs  Continue Gilenya  See you back in 6 months

## 2020-01-20 ENCOUNTER — Telehealth: Payer: Self-pay

## 2020-01-20 LAB — CBC WITH DIFFERENTIAL/PLATELET
Basophils Absolute: 0 10*3/uL (ref 0.0–0.2)
Basos: 0 %
EOS (ABSOLUTE): 0.1 10*3/uL (ref 0.0–0.4)
Eos: 2 %
Hematocrit: 40.5 % (ref 34.0–46.6)
Hemoglobin: 13.1 g/dL (ref 11.1–15.9)
Immature Grans (Abs): 0 10*3/uL (ref 0.0–0.1)
Immature Granulocytes: 0 %
Lymphocytes Absolute: 0.5 10*3/uL — ABNORMAL LOW (ref 0.7–3.1)
Lymphs: 13 %
MCH: 32.2 pg (ref 26.6–33.0)
MCHC: 32.3 g/dL (ref 31.5–35.7)
MCV: 100 fL — ABNORMAL HIGH (ref 79–97)
Monocytes Absolute: 0.7 10*3/uL (ref 0.1–0.9)
Monocytes: 19 %
Neutrophils Absolute: 2.6 10*3/uL (ref 1.4–7.0)
Neutrophils: 66 %
Platelets: 249 10*3/uL (ref 150–450)
RBC: 4.07 x10E6/uL (ref 3.77–5.28)
RDW: 12.5 % (ref 11.7–15.4)
WBC: 3.9 10*3/uL (ref 3.4–10.8)

## 2020-01-20 LAB — COMPREHENSIVE METABOLIC PANEL
ALT: 35 IU/L — ABNORMAL HIGH (ref 0–32)
AST: 35 IU/L (ref 0–40)
Albumin/Globulin Ratio: 1.9 (ref 1.2–2.2)
Albumin: 4.2 g/dL (ref 3.8–4.9)
Alkaline Phosphatase: 80 IU/L (ref 44–121)
BUN/Creatinine Ratio: 18 (ref 9–23)
BUN: 13 mg/dL (ref 6–24)
Bilirubin Total: 0.3 mg/dL (ref 0.0–1.2)
CO2: 26 mmol/L (ref 20–29)
Calcium: 9.3 mg/dL (ref 8.7–10.2)
Chloride: 105 mmol/L (ref 96–106)
Creatinine, Ser: 0.73 mg/dL (ref 0.57–1.00)
GFR calc Af Amer: 107 mL/min/{1.73_m2} (ref >59)
GFR calc non Af Amer: 93 mL/min/{1.73_m2} (ref >59)
Globulin, Total: 2.2 g/dL (ref 1.5–4.5)
Glucose: 92 mg/dL (ref 65–99)
Potassium: 3.8 mmol/L (ref 3.5–5.2)
Sodium: 143 mmol/L (ref 134–144)
Total Protein: 6.4 g/dL (ref 6.0–8.5)

## 2020-01-20 NOTE — Telephone Encounter (Signed)
-----   Message from Glean Salvo, NP sent at 01/20/2020  7:32 AM EST ----- Labs are stable on Gilenya, absol lymp is 0.5, very mild increase in ALT 35. Continue to follow overtime. Continue with current treatment plan.

## 2020-01-20 NOTE — Telephone Encounter (Signed)
Pt verified by name and DOB, results given per provider, pt voiced understanding all question answered. 

## 2020-01-20 NOTE — Progress Notes (Signed)
I have read the note, and I agree with the clinical assessment and plan.  Jermell Holeman K Aditi Rovira   

## 2020-02-05 ENCOUNTER — Other Ambulatory Visit: Payer: Self-pay | Admitting: Nurse Practitioner

## 2020-02-05 DIAGNOSIS — B009 Herpesviral infection, unspecified: Secondary | ICD-10-CM

## 2020-02-17 ENCOUNTER — Encounter: Payer: Self-pay | Admitting: Nurse Practitioner

## 2020-02-17 ENCOUNTER — Other Ambulatory Visit: Payer: Self-pay

## 2020-02-17 ENCOUNTER — Ambulatory Visit (INDEPENDENT_AMBULATORY_CARE_PROVIDER_SITE_OTHER): Payer: BC Managed Care – PPO | Admitting: Nurse Practitioner

## 2020-02-17 VITALS — BP 141/84 | HR 89 | Temp 97.3°F | Ht 64.0 in | Wt 149.0 lb

## 2020-02-17 DIAGNOSIS — D229 Melanocytic nevi, unspecified: Secondary | ICD-10-CM | POA: Diagnosis not present

## 2020-02-17 DIAGNOSIS — E785 Hyperlipidemia, unspecified: Secondary | ICD-10-CM

## 2020-02-17 MED ORDER — OMEGA-3-ACID ETHYL ESTERS 1 G PO CAPS: 1.0000 | ORAL_CAPSULE | Freq: Every day | ORAL | 3 refills | Status: DC

## 2020-02-17 MED ORDER — ROSUVASTATIN CALCIUM 10 MG PO TABS: ORAL_TABLET | ORAL | 3 refills | Status: DC

## 2020-02-17 NOTE — Progress Notes (Signed)
[place], [place]  [address] Phone:  [phone]   Fax:  [phone]   Established Patient Office Visit  Subjective:  Patient ID: Kelly Grant, female    DOB: [birth year]  Age: 55 y.o. MRN: [id number]  CC:  Chief Complaint  Patient presents with  . Follow-up    Follow up , 6 month follow up , mold on side of left  check    HPI Kelly Grant presents for 6 month follow up. She  has a past medical history of Abnormal Pap smear, Chlamydia, History of chicken pox, HSV infection, Multiple sclerosis ([place]), Neuromuscular disorder ([place]), and Yeast infection.   Dyslipidemia Patient presents for evaluation of lipids. Compliance with treatment thus far has been good. A repeat fasting lipid profile was done. The patient does not use medications that may worsen dyslipidemias (corticosteroids, progestins, anabolic steroids, diuretics, beta-blockers, amiodarone, cyclosporine, olanzapine). The patient exercises infrequently.  The patient is not known to have coexisting coronary artery disease.   Cardiac Risk Factors Age > 45-female, > 55-female:  YES  +1  Smoking:   NO  Sig. family hx of CHD*:  NO  Hypertension:   NO  Diabetes:   NO  HDL < 35:   NO  HDL > 59:   YES  -1  Total: 0   Moles She has a history of minimal sun exposure. She is in the sun intermittently. She uses sunscreen infrequently. She reports no skin symptoms. Her moles are located on her face and are gradually worsening. Spots of concern:  Face. She has noticed growth over the last few months.   Past Medical History:  Diagnosis Date  . Abnormal Pap smear    ASC-H  . Chlamydia   . History of chicken pox   . HSV infection   . Multiple sclerosis ([place])   . Neuromuscular disorder ([place])    MS  . Yeast infection     Past Surgical History:  Procedure Laterality Date  . CARPAL TUNNEL RELEASE    . CESAREAN SECTION     x2  . INCISION AND DRAINAGE ABSCESS     left buttock   . TUBAL LIGATION      Family History  Problem Relation Age of Onset  . Kidney failure Mother   . Diabetes Mother   . Colon cancer Neg Hx     Social History   Socioeconomic History  . Marital status: Single    Spouse name: Not on file  . Number of children: 2  . Years of education: College   . Highest education level: Not on file  Occupational History  . Occupation: HOUSEKEEPING    Employer: [employer]  Tobacco Use  . Smoking status: Former Smoker    Quit date: 01/23/2000    Years since quitting: 20.0  . Smokeless tobacco: Never Used  Vaping Use  . Vaping Use: Never used  Substance and Sexual Activity  . Alcohol use: Yes    Alcohol/week: 0.0 standard drinks    Comment: occasional  . Drug use: No  . Sexual activity: Yes    Partners: Male    Birth control/protection: None, Surgical  Other Topics Concern  . Not on file  Social History Narrative   Patient lives at home with children.    Patient has 2 children.    Patient has a college degree.    Patient is single.    Patient is right handed.  Patient is currently employed.    Social Determinants of Health   Financial Resource Strain: Not on file  Food Insecurity: Not on file  Transportation Needs: Not on file  Physical Activity: Not on file  Stress: Not on file  Social Connections: Not on file  Intimate Partner Violence: Not on file    Outpatient Medications Prior to Visit  Medication Sig Dispense Refill  . aspirin EC 81 MG tablet Take 1 tablet (81 mg total) by mouth daily. 30 tablet 11  . calcium-vitamin D (OSCAL WITH D) 500-200 MG-UNIT tablet Take 2 tablets by mouth.     . carboxymethylcellulose (REFRESH PLUS) 0.5 % SOLN 1 drop daily as needed.    . cycloSPORINE (RESTASIS) 0.05 % ophthalmic emulsion 1 drop 2 (two) times daily.    . diclofenac sodium (VOLTAREN) 1 % GEL Apply topically as needed.    . Fingolimod HCl (GILENYA) 0.5 MG CAPS Take 1 daily 90 capsule 3  . Multiple Vitamin (MULTIVITAMIN)  tablet Take 1 tablet by mouth daily. 30 tablet 3  . valACYclovir (VALTREX) 500 MG tablet TAKE 1 TABLET(500 MG) BY MOUTH DAILY 90 tablet 0  . Wheat Dextrin (BENEFIBER DRINK MIX PO) Take by mouth. 2 tsp TID    . omega-3 acid ethyl esters (LOVAZA) 1 g capsule TAKE 1 CAPSULE BY MOUTH ONCE DAILY 90 capsule 3  . rosuvastatin (CRESTOR) 10 MG tablet TAKE 1 TABLET(10 MG) BY MOUTH AT BEDTIME 90 tablet 3   No facility-administered medications prior to visit.    Allergies  Allergen Reactions  . Macrobid [Nitrofurantoin Macrocrystal] Hives    ROS Review of Systems  Constitutional: Negative.   HENT: Negative.   Eyes: Positive for visual disturbance (glasses).  Respiratory: Negative for shortness of breath.   Cardiovascular: Negative for chest pain.  Gastrointestinal: Negative for constipation, diarrhea, nausea and vomiting.  Endocrine: Negative.   Genitourinary: Negative.   Musculoskeletal: Negative.   Allergic/Immunologic: Negative.   Neurological: Negative.   Hematological: Negative.   Psychiatric/Behavioral: Negative.       Objective:    Physical Exam Constitutional:      General: She is not in acute distress.    Appearance: Normal appearance.  HENT:     Head: Normocephalic and atraumatic.     Nose: Nose normal.     Mouth/Throat:     Mouth: Mucous membranes are moist.  Cardiovascular:     Rate and Rhythm: Normal rate and regular rhythm.     Pulses: Normal pulses.     Heart sounds: Normal heart sounds.  Pulmonary:     Effort: Pulmonary effort is normal.     Breath sounds: Normal breath sounds.  Abdominal:     General: Bowel sounds are normal.     Palpations: Abdomen is soft.  Musculoskeletal:        General: Normal range of motion.  Skin:    General: Skin is warm and dry.     Capillary Refill: Capillary refill takes less than 2 seconds.     Comments: Left facial mole  Neurological:     General: No focal deficit present.     Mental Status: She is alert.  Psychiatric:         Mood and Affect: Mood normal.        Behavior: Behavior normal.        Thought Content: Thought content normal.        Judgment: Judgment normal.     BP (!) 141/84 (BP Location: Left Arm, Patient  Position: Sitting, Cuff Size: Normal)   Pulse 89   Temp (!) 97.3 F (36.3 C) (Temporal)   Ht 5\' 4"  (1.626 m)   Wt 149 lb (67.6 kg)   SpO2 98%   BMI 25.58 kg/m  Wt Readings from Last 3 Encounters:  02/17/20 149 lb (67.6 kg)  01/19/20 150 lb (68 kg)  10/12/19 142 lb (64.4 kg)     There are no preventive care reminders to display for this patient.  There are no preventive care reminders to display for this patient.  Lab Results  Component Value Date   TSH 0.927 12/19/2016   Lab Results  Component Value Date   WBC 3.9 01/19/2020   HGB 13.1 01/19/2020   HCT 40.5 01/19/2020   MCV 100 (H) 01/19/2020   PLT 249 01/19/2020   Lab Results  Component Value Date   NA 143 01/19/2020   K 3.8 01/19/2020   CO2 26 01/19/2020   GLUCOSE 92 01/19/2020   BUN 13 01/19/2020   CREATININE 0.73 01/19/2020   BILITOT 0.3 01/19/2020   ALKPHOS 80 01/19/2020   AST 35 01/19/2020   ALT 35 (H) 01/19/2020   PROT 6.4 01/19/2020   ALBUMIN 4.2 01/19/2020   CALCIUM 9.3 01/19/2020   Lab Results  Component Value Date   CHOL 177 02/17/2020   Lab Results  Component Value Date   HDL 65 02/17/2020   Lab Results  Component Value Date   LDLCALC 98 02/17/2020   Lab Results  Component Value Date   TRIG 77 02/17/2020   Lab Results  Component Value Date   CHOLHDL 2.7 02/17/2020   Lab Results  Component Value Date   HGBA1C 5.2 02/17/2016      Assessment & Plan:   Problem List Items Addressed This Visit   None   Visit Diagnoses    Hyperlipidemia, unspecified hyperlipidemia type    -  Primary Stable continue with current regimen tolerating medication without difficulty Continue to monitor diet for high fat, high cholesterol foods Encourage regular exericise   Relevant Medications    omega-3 acid ethyl esters (LOVAZA) 1 g capsule   rosuvastatin (CRESTOR) 10 MG tablet   Other Relevant Orders   Lipid panel (Completed)   Nevus     Discussed abnormalities with moles(nevi)  Encourage year round use of sunscreen   Referral to Dermatology due to the change in size, the desire for removal and location   Relevant Orders   Ambulatory referral to Dermatology      Meds ordered this encounter  Medications  . omega-3 acid ethyl esters (LOVAZA) 1 g capsule    Sig: Take 1 capsule (1 g total) by mouth daily.    Dispense:  90 capsule    Refill:  3    Order Specific Question:   Supervising Provider    Answer:   Tresa Garter [id number]  . rosuvastatin (CRESTOR) 10 MG tablet    Sig: TAKE 1 TABLET(10 MG) BY MOUTH AT BEDTIME    Dispense:  90 tablet    Refill:  3    Order Specific Question:   Supervising Provider    Answer:   Tresa Garter [id number]    Follow-up: No follow-ups on file.    Vevelyn Francois, NP

## 2020-02-18 LAB — LIPID PANEL
Chol/HDL Ratio: 2.7 ratio (ref 0.0–4.4)
Cholesterol, Total: 177 mg/dL (ref 100–199)
HDL: 65 mg/dL (ref >39)
LDL Chol Calc (NIH): 98 mg/dL (ref 0–99)
Triglycerides: 77 mg/dL (ref 0–149)
VLDL Cholesterol Cal: 14 mg/dL (ref 5–40)

## 2020-02-19 ENCOUNTER — Telehealth: Payer: Self-pay | Admitting: Dermatology

## 2020-02-19 NOTE — Telephone Encounter (Signed)
Patient is calling for a referral appointment from Dr. Dionisio David.  Appointment is scheduled for 07/12/2020 at 9:00 with Lavonna Monarch, MD.

## 2020-02-20 ENCOUNTER — Encounter: Payer: Self-pay | Admitting: Nurse Practitioner

## 2020-02-22 DIAGNOSIS — H2513 Age-related nuclear cataract, bilateral: Secondary | ICD-10-CM | POA: Diagnosis not present

## 2020-02-22 DIAGNOSIS — G35 Multiple sclerosis: Secondary | ICD-10-CM | POA: Diagnosis not present

## 2020-02-22 DIAGNOSIS — H04123 Dry eye syndrome of bilateral lacrimal glands: Secondary | ICD-10-CM | POA: Diagnosis not present

## 2020-02-22 NOTE — Telephone Encounter (Signed)
Referral attached to appointment

## 2020-03-31 ENCOUNTER — Ambulatory Visit (INDEPENDENT_AMBULATORY_CARE_PROVIDER_SITE_OTHER): Payer: BC Managed Care – PPO | Admitting: Obstetrics

## 2020-03-31 ENCOUNTER — Encounter: Payer: Self-pay | Admitting: Obstetrics

## 2020-03-31 ENCOUNTER — Other Ambulatory Visit: Payer: Self-pay

## 2020-03-31 ENCOUNTER — Other Ambulatory Visit (HOSPITAL_COMMUNITY)
Admission: RE | Admit: 2020-03-31 | Discharge: 2020-03-31 | Disposition: A | Discharge status: Home / Self Care | Payer: BC Managed Care – PPO | Source: Ambulatory Visit | Attending: Obstetrics | Admitting: Obstetrics

## 2020-03-31 VITALS — BP 136/93 | HR 69 | Ht 64.0 in | Wt 149.2 lb

## 2020-03-31 DIAGNOSIS — Z01419 Encounter for gynecological examination (general) (routine) without abnormal findings: Secondary | ICD-10-CM | POA: Diagnosis not present

## 2020-03-31 NOTE — Progress Notes (Signed)
Subjective:        Kelly Grant is a 55 y.o. female here for a routine exam.  Current complaints: None.    Personal health questionnaire:  Is [name], have a family history of breast and/or ovarian cancer: no Is there a family history of uterine cancer diagnosed at age < 70, gastrointestinal cancer, urinary tract cancer, family member who is a Field seismologist syndrome-associated carrier: no Is the patient overweight and hypertensive, family history of diabetes, personal history of gestational diabetes, preeclampsia or PCOS: no Is patient over 31, have PCOS,  family history of premature CHD under age 21, diabetes, smoke, have hypertension or peripheral artery disease:  no At any time, has a partner hit, kicked or otherwise hurt or frightened you?: no Over the past 2 weeks, have you felt down, depressed or hopeless?: no Over the past 2 weeks, have you felt little interest or pleasure in doing things?:no   Gynecologic History No LMP recorded. Patient is postmenopausal. Contraception: tubal ligation Last Pap: 03-27-2019. Results were: ASCUS with positive HRHPV Last mammogram: 10-28-2017. Results were: normal  Obstetric History OB History  Gravida Para Term Preterm AB Living  3 2     1 2   SAB IAB Ectopic Multiple Live Births    1     2    # Outcome Date GA Lbr Len/2nd Weight Sex Delivery Anes PTL Lv  3 Para           2 Para           1 IAB             Past Medical History:  Diagnosis Date  . Abnormal Pap smear    ASC-H  . Chlamydia   . History of chicken pox   . HSV infection   . Multiple sclerosis ([place])   . Neuromuscular disorder ([place])    MS  . Yeast infection     Past Surgical History:  Procedure Laterality Date  . CARPAL TUNNEL RELEASE    . CESAREAN SECTION     x2  . INCISION AND DRAINAGE ABSCESS     left buttock  . TUBAL LIGATION       Current Outpatient Medications:  .  aspirin EC 81 MG tablet, Take 1 tablet (81 mg total) by mouth daily., Disp: 30  tablet, Rfl: 11 .  calcium-vitamin D (OSCAL WITH D) 500-200 MG-UNIT tablet, Take 2 tablets by mouth. , Disp: , Rfl:  .  carboxymethylcellulose (REFRESH PLUS) 0.5 % SOLN, 1 drop daily as needed., Disp: , Rfl:  .  cycloSPORINE (RESTASIS) 0.05 % ophthalmic emulsion, 1 drop 2 (two) times daily., Disp: , Rfl:  .  diclofenac sodium (VOLTAREN) 1 % GEL, Apply topically as needed., Disp: , Rfl:  .  Fingolimod HCl (GILENYA) 0.5 MG CAPS, Take 1 daily, Disp: 90 capsule, Rfl: 3 .  Multiple Vitamin (MULTIVITAMIN) tablet, Take 1 tablet by mouth daily., Disp: 30 tablet, Rfl: 3 .  omega-3 acid ethyl esters (LOVAZA) 1 g capsule, Take 1 capsule (1 g total) by mouth daily., Disp: 90 capsule, Rfl: 3 .  rosuvastatin (CRESTOR) 10 MG tablet, TAKE 1 TABLET(10 MG) BY MOUTH AT BEDTIME, Disp: 90 tablet, Rfl: 3 .  valACYclovir (VALTREX) 500 MG tablet, TAKE 1 TABLET(500 MG) BY MOUTH DAILY, Disp: 90 tablet, Rfl: 0 .  Wheat Dextrin (BENEFIBER DRINK MIX PO), Take by mouth. 2 tsp TID, Disp: , Rfl:  Allergies  Allergen Reactions  . Macrobid [Nitrofurantoin Macrocrystal] Hives  Social History   Tobacco Use  . Smoking status: Former Smoker    Quit date: 01/23/2000    Years since quitting: 20.2  . Smokeless tobacco: Never Used  Substance Use Topics  . Alcohol use: Yes    Alcohol/week: 0.0 standard drinks    Comment: occasional    Family History  Problem Relation Age of Onset  . Kidney failure Mother   . Diabetes Mother   . Colon cancer Neg Hx       Review of Systems  Constitutional: negative for fatigue and weight loss Respiratory: negative for cough and wheezing Cardiovascular: negative for chest pain, fatigue and palpitations Gastrointestinal: negative for abdominal pain and change in bowel habits Musculoskeletal:negative for myalgias Neurological: negative for gait problems and tremors Behavioral/Psych: negative for abusive relationship, depression Endocrine: negative for temperature intolerance     Genitourinary:negative for abnormal menstrual periods, genital lesions, hot flashes, sexual problems and vaginal discharge Integument/breast: negative for breast lump, breast tenderness, nipple discharge and skin lesion(s)    Objective:       BP (!) 136/93   Pulse 69   Ht 5\' 4"  (1.626 m)   Wt 149 lb 3.2 oz (67.7 kg)   BMI 25.61 kg/m  General:   alert and no distress  Skin:   no rash or abnormalities  Lungs:   clear to auscultation bilaterally  Heart:   regular rate and rhythm, S1, S2 normal, no murmur, click, rub or gallop  Breasts:   normal without suspicious masses, skin or nipple changes or axillary nodes  Abdomen:  normal findings: no organomegaly, soft, non-tender and no hernia  Pelvis:  External genitalia: normal general appearance Urinary system: urethral meatus normal and bladder without fullness, nontender Vaginal: normal without tenderness, induration or masses Cervix: normal appearance Adnexa: normal bimanual exam Uterus: anteverted and non-tender, normal size   Lab Review Urine pregnancy test Labs reviewed yes Radiologic studies reviewed yes  50% of 20 min visit spent on counseling and coordination of care.   Assessment:     1. Encounter for gynecological examination with Papanicolaou smear of cervix Rx: - Cytology - PAP( [facility])   Plan:    Education reviewed: calcium supplements, depression evaluation, low fat, low cholesterol diet, safe sex/STD prevention, self breast exams, skin cancer screening and weight bearing exercise. Follow up in: 1 year.     Shelly Bombard, MD 03/31/2020 11:12 AM

## 2020-03-31 NOTE — Progress Notes (Signed)
Patient presents for Annual Exam today .  Last pap: 03/27/2019 +HPV ASC-H   Mammogram: Done at [facility] 2021 not due at this time will schedule.   NU:UVOZ

## 2020-04-07 LAB — CYTOLOGY - PAP
Comment: NEGATIVE
Comment: NEGATIVE
Diagnosis: UNDETERMINED — AB
HPV 16: NEGATIVE
HPV 18 / 45: NEGATIVE
High risk HPV: POSITIVE — AB

## 2020-05-09 ENCOUNTER — Other Ambulatory Visit: Payer: Self-pay | Admitting: Nurse Practitioner

## 2020-05-09 DIAGNOSIS — B009 Herpesviral infection, unspecified: Secondary | ICD-10-CM

## 2020-05-10 ENCOUNTER — Telehealth: Payer: Self-pay

## 2020-05-10 NOTE — Telephone Encounter (Signed)
Med reill  Valacyclovir  Rosuvastatin

## 2020-05-11 ENCOUNTER — Other Ambulatory Visit (HOSPITAL_COMMUNITY): Payer: Self-pay | Admitting: Nurse Practitioner

## 2020-05-11 DIAGNOSIS — B009 Herpesviral infection, unspecified: Secondary | ICD-10-CM

## 2020-05-11 NOTE — Telephone Encounter (Signed)
She has refills on the rosuvastatin

## 2020-07-12 ENCOUNTER — Ambulatory Visit (INDEPENDENT_AMBULATORY_CARE_PROVIDER_SITE_OTHER): Payer: BC Managed Care – PPO | Admitting: Dermatology

## 2020-07-12 ENCOUNTER — Other Ambulatory Visit: Payer: Self-pay

## 2020-07-12 DIAGNOSIS — L820 Inflamed seborrheic keratosis: Secondary | ICD-10-CM | POA: Diagnosis not present

## 2020-07-12 DIAGNOSIS — D485 Neoplasm of uncertain behavior of skin: Secondary | ICD-10-CM

## 2020-07-12 NOTE — Patient Instructions (Signed)

## 2020-07-18 ENCOUNTER — Encounter: Payer: Self-pay | Admitting: Dermatology

## 2020-07-18 NOTE — Progress Notes (Signed)
PATIENT: Kelly Grant DOB: Apr 18, 1965  REASON FOR VISIT: follow up HISTORY FROM: patient Primary Neurologist: Dr. Jannifer Franklin   HISTORY OF PRESENT ILLNESS: Today 07/19/20 Kelly Grant is a 55 year old female with history of RRMS on Gilenya.  MRI of the brain in July 2021 was overall stable compared to previous in June 2018.  There were no new lesions.  Remains overall stable.  With overuse, will note weakness to the left arm.  She works for [employer], is physical job. Works 6 PM-3 AM. 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 No falls.  Has annual evaluation with Dr. Katy Fitch.  Denies any new problems or concerns.  Feels MS is overall stable.  Update 01/19/2020 SS: Kelly Grant is a 55 year old female with history of relapsing remitting multiple sclerosis.  She is on Gilenya.  MRI of the brain in July 2021 was overall stable compared to previous in June 2018, no new lesions.  MS remains overall stable, has chronic weakness to the left arm with prolonged carrying.  Continues to work full-time for [employer], goes in around 6 pm, gets off at 3 am.  Know she does not drink enough water.  She did see Dr. Katy Fitch, seeing again next month to check on glaucoma.  No falls.  No new problems or concerns.  Presents today for evaluation unaccompanied.  HISTORY 07/20/2019 SS: Kelly Grant is a 55 year old female with history of relapsing remitting multiple sclerosis.  She remains on Gilenya.  She denies any new or worsening MS symptoms.  Feels her MS is overall stable.  She does have chronic weakness to the left arm.  She continues to work full-time on [employer], she is a Librarian, academic.  She does her job well. She works 6 days a week. Did have eye exam done at [facility] in January, isn't sure how thorough it was.  Presents today for evaluation unaccompanied.   REVIEW OF SYSTEMS: Out of a complete 14 system review of symptoms, the patient complains only of the following symptoms, and all  other reviewed systems are negative.  N/A  ALLERGIES: Allergies  Allergen Reactions   Macrobid [Nitrofurantoin Macrocrystal] Hives    HOME MEDICATIONS: Outpatient Medications Prior to Visit  Medication Sig Dispense Refill   aspirin EC 81 MG tablet Take 1 tablet (81 mg total) by mouth daily. 30 tablet 11   calcium-vitamin D (OSCAL WITH D) 500-200 MG-UNIT tablet Take 2 tablets by mouth.      carboxymethylcellulose (REFRESH PLUS) 0.5 % SOLN 1 drop daily as needed.     cycloSPORINE (RESTASIS) 0.05 % ophthalmic emulsion 1 drop 2 (two) times daily.     diclofenac sodium (VOLTAREN) 1 % GEL Apply topically as needed.     Multiple Vitamin (MULTIVITAMIN) tablet Take 1 tablet by mouth daily. 30 tablet 3   omega-3 acid ethyl esters (LOVAZA) 1 g capsule Take 1 capsule (1 g total) by mouth daily. 90 capsule 3   rosuvastatin (CRESTOR) 10 MG tablet TAKE 1 TABLET(10 MG) BY MOUTH AT BEDTIME 90 tablet 3   valACYclovir (VALTREX) 500 MG tablet TAKE 1 TABLET(500 MG) BY MOUTH DAILY 90 tablet 0   Wheat Dextrin (BENEFIBER DRINK MIX PO) Take by mouth. 2 tsp TID     Fingolimod HCl (GILENYA) 0.5 MG CAPS Take 1 daily 90 capsule 3   No facility-administered medications prior to visit.    PAST MEDICAL HISTORY: Past Medical History:  Diagnosis Date   Abnormal Pap smear  ASC-H   Chlamydia    History of chicken pox    HSV infection    Multiple sclerosis ([place])    Neuromuscular disorder ([place])    MS   Yeast infection     PAST SURGICAL HISTORY: Past Surgical History:  Procedure Laterality Date   CARPAL TUNNEL RELEASE     CESAREAN SECTION     x2   INCISION AND DRAINAGE ABSCESS     left buttock   TUBAL LIGATION      FAMILY HISTORY: Family History  Problem Relation Age of Onset   Kidney failure Mother    Diabetes Mother    Colon cancer Neg Hx     SOCIAL HISTORY: Social History   Socioeconomic History   Marital status: Single    Spouse name: Not on file   Number of children: 2   Years of  education: College    Highest education level: Not on file  Occupational History   Occupation: HOUSEKEEPING    Employer: [employer]  Tobacco Use   Smoking status: Former    Pack years: 0.00    Types: Cigarettes    Quit date: 01/23/2000    Years since quitting: 20.5   Smokeless tobacco: Never  Vaping Use   Vaping Use: Never used  Substance and Sexual Activity   Alcohol use: Yes    Alcohol/week: 0.0 standard drinks    Comment: occasional   Drug use: No   Sexual activity: Yes    Partners: Male    Birth control/protection: None, Surgical  Other Topics Concern   Not on file  Social History Narrative   Patient lives at home with children.    Patient has 2 children.    Patient has a college degree.    Patient is single.    Patient is right handed.    Patient is currently employed.    Social Determinants of Health   Financial Resource Strain: Not on file  Food Insecurity: Not on file  Transportation Needs: Not on file  Physical Activity: Not on file  Stress: Not on file  Social Connections: Not on file  Intimate Partner Violence: Not on file   PHYSICAL EXAM  Vitals:   07/19/20 1117  BP: (!) 152/81  Pulse: 62  Weight: 150 lb (68 kg)  Height: 5\' 4"  (1.626 m)    Body mass index is 25.75 kg/m.  Generalized: Well developed, in no acute distress   Neurological examination  Mentation: Alert oriented to time, place, history taking. Follows all commands speech and language fluent Cranial nerve II-XII: Pupils were equal round reactive to light. Extraocular movements were full, visual field were full on confrontational test. Facial sensation and strength were normal. Head turning and shoulder shrug were normal and symmetric. Motor: 5/5 strength all extremities, very mild weakness left upper extremity 4.5/5 Sensory: Sensory testing is intact to soft touch on all 4 extremities. No evidence of extinction is noted.  Coordination: Cerebellar testing reveals good  finger-nose-finger and heel-to-shin bilaterally.  Gait and station: Gait is normal. Tandem gait is normal. Romberg is negative. No drift is seen.  Reflexes: Deep tendon reflexes are symmetric and normal bilaterally.   DIAGNOSTIC DATA (LABS, IMAGING, TESTING) - I reviewed patient records, labs, notes, testing and imaging myself where available.  Lab Results  Component Value Date   WBC 3.9 01/19/2020   HGB 13.1 01/19/2020   HCT 40.5 01/19/2020   MCV 100 (H) 01/19/2020   PLT 249 01/19/2020  Component Value Date/Time   NA 143 01/19/2020 1039   K 3.8 01/19/2020 1039   CL 105 01/19/2020 1039   CO2 26 01/19/2020 1039   GLUCOSE 92 01/19/2020 1039   GLUCOSE 80 02/17/2015 1353   BUN 13 01/19/2020 1039   CREATININE 0.73 01/19/2020 1039   CREATININE 0.77 02/17/2015 1353   CALCIUM 9.3 01/19/2020 1039   PROT 6.4 01/19/2020 1039   ALBUMIN 4.2 01/19/2020 1039   AST 35 01/19/2020 1039   ALT 35 (H) 01/19/2020 1039   ALKPHOS 80 01/19/2020 1039   BILITOT 0.3 01/19/2020 1039   GFRNONAA 93 01/19/2020 1039   GFRNONAA >89 02/17/2015 1353   GFRAA 107 01/19/2020 1039   GFRAA >89 02/17/2015 1353   Lab Results  Component Value Date   CHOL 177 02/17/2020   HDL 65 02/17/2020   LDLCALC 98 02/17/2020   TRIG 77 02/17/2020   CHOLHDL 2.7 02/17/2020   Lab Results  Component Value Date   HGBA1C 5.2 02/17/2016   Lab Results  Component Value Date   VITAMINB12 682 02/18/2019   Lab Results  Component Value Date   TSH 0.927 12/19/2016    ASSESSMENT AND PLAN 55 y.o. year old female  has a past medical history of Abnormal Pap smear, Chlamydia, History of chicken pox, HSV infection, Multiple sclerosis ([place]), Neuromuscular disorder ([place]), and Yeast infection. here with:  1.  Relapsing remitting multiple sclerosis  -Continues to do well, MS seems to remains overall stable, will remain on Gilenya -Check routine labs while on Gilenya -Recommend annual ophthalmology evaluation -MRI of the brain  in July 2021 was overall stable -Follow-up in 6 months or sooner if needed  Evangeline Dakin, DNP 07/19/2020, 11:36 AM [facility] [address], [place],  [address] [phone]

## 2020-07-18 NOTE — Progress Notes (Signed)
   Follow-Up Visit   Subjective  Kelly Grant is a 55 y.o. female who presents for the following: Skin Problem (Patient has a mole on the left side face that has gotten larger over the years. Patient has had this mole for years. ).  Growth left cheek has enlarged Location:  Duration:  Quality:  Associated Signs/Symptoms: Modifying Factors:  Severity:  Timing: Context:   Objective  Well appearing patient in no apparent distress; mood and affect are within normal limits. Left Anterior Mandible Exophytic slightly verrucous 6 mm gritty pink papule         A focused examination was performed including head, neck, back.. Relevant physical exam findings are noted in the Assessment and Plan.   Assessment & Plan    Neoplasm of uncertain behavior of skin Left Anterior Mandible  Skin / nail biopsy Type of biopsy: tangential   Informed consent: discussed and consent obtained   Timeout: patient name, date of birth, surgical site, and procedure verified   Anesthesia: the lesion was anesthetized in a standard fashion   Anesthetic:  1% lidocaine w/ epinephrine 1-100,000 local infiltration Instrument used: flexible razor blade   Hemostasis achieved with: ferric subsulfate   Outcome: patient tolerated procedure well   Post-procedure details: wound care instructions given    Specimen 1 - Surgical pathology Differential Diagnosis: atypia, CN  Check Margins: No      I, Lavonna Monarch, MD, have reviewed all documentation for this visit.  The documentation on 07/18/20 for the exam, diagnosis, procedures, and orders are all accurate and complete.

## 2020-07-19 ENCOUNTER — Ambulatory Visit (INDEPENDENT_AMBULATORY_CARE_PROVIDER_SITE_OTHER): Payer: BC Managed Care – PPO | Admitting: Neurology

## 2020-07-19 ENCOUNTER — Encounter: Payer: Self-pay | Admitting: Neurology

## 2020-07-19 VITALS — BP 152/81 | HR 62 | Ht 64.0 in | Wt 150.0 lb

## 2020-07-19 DIAGNOSIS — G35 Multiple sclerosis: Secondary | ICD-10-CM

## 2020-07-19 MED ORDER — GILENYA 0.5 MG PO CAPS: ORAL_CAPSULE | ORAL | 3 refills | Status: DC

## 2020-07-19 NOTE — Patient Instructions (Signed)
Continue Gilenya  Check labs today  Call for any worsening symptoms See you back in 6 months

## 2020-07-20 ENCOUNTER — Telehealth: Payer: Self-pay

## 2020-07-20 LAB — CBC WITH DIFFERENTIAL/PLATELET
Basophils Absolute: 0 10*3/uL (ref 0.0–0.2)
Basos: 0 %
EOS (ABSOLUTE): 0.1 10*3/uL (ref 0.0–0.4)
Eos: 2 %
Hematocrit: 40.2 % (ref 34.0–46.6)
Hemoglobin: 13.2 g/dL (ref 11.1–15.9)
Immature Grans (Abs): 0 10*3/uL (ref 0.0–0.1)
Immature Granulocytes: 0 %
Lymphocytes Absolute: 0.8 10*3/uL (ref 0.7–3.1)
Lymphs: 18 %
MCH: 31.7 pg (ref 26.6–33.0)
MCHC: 32.8 g/dL (ref 31.5–35.7)
MCV: 97 fL (ref 79–97)
Monocytes Absolute: 0.8 10*3/uL (ref 0.1–0.9)
Monocytes: 16 %
Neutrophils Absolute: 2.9 10*3/uL (ref 1.4–7.0)
Neutrophils: 64 %
Platelets: 256 10*3/uL (ref 150–450)
RBC: 4.16 x10E6/uL (ref 3.77–5.28)
RDW: 13.1 % (ref 11.7–15.4)
WBC: 4.6 10*3/uL (ref 3.4–10.8)

## 2020-07-20 LAB — COMPREHENSIVE METABOLIC PANEL
ALT: 44 IU/L — ABNORMAL HIGH (ref 0–32)
AST: 39 IU/L (ref 0–40)
Albumin/Globulin Ratio: 2.4 — ABNORMAL HIGH (ref 1.2–2.2)
Albumin: 4.5 g/dL (ref 3.8–4.9)
Alkaline Phosphatase: 86 IU/L (ref 44–121)
BUN/Creatinine Ratio: 13 (ref 9–23)
BUN: 10 mg/dL (ref 6–24)
Bilirubin Total: 0.4 mg/dL (ref 0.0–1.2)
CO2: 24 mmol/L (ref 20–29)
Calcium: 9.7 mg/dL (ref 8.7–10.2)
Chloride: 105 mmol/L (ref 96–106)
Creatinine, Ser: 0.77 mg/dL (ref 0.57–1.00)
Globulin, Total: 1.9 g/dL (ref 1.5–4.5)
Glucose: 86 mg/dL (ref 65–99)
Potassium: 4.1 mmol/L (ref 3.5–5.2)
Sodium: 143 mmol/L (ref 134–144)
Total Protein: 6.4 g/dL (ref 6.0–8.5)
eGFR: 90 mL/min/{1.73_m2} (ref >59)

## 2020-07-20 NOTE — Telephone Encounter (Signed)
-----   Message from Suzzanne Cloud, NP sent at 07/20/2020  6:00 AM EDT ----- Ignacia Palma,  Blood work looks overall good. Absolute lymph is 0.8, we follow this to make sure not too low. Do not want less than 0.2. ALT which is a liver enzyme is mildly elevated at 44. Will follow overtime. Continue current treatment plan with Gilenya. Let me know if you have any questions! Judson Roch

## 2020-07-21 ENCOUNTER — Encounter: Payer: Self-pay | Admitting: *Deleted

## 2020-07-21 ENCOUNTER — Ambulatory Visit
Admission: EM | Admit: 2020-07-21 | Discharge: 2020-07-21 | Disposition: A | Discharge status: Home / Self Care | Payer: BC Managed Care – PPO | Attending: Emergency Medicine | Admitting: Emergency Medicine

## 2020-07-21 DIAGNOSIS — M25562 Pain in left knee: Secondary | ICD-10-CM

## 2020-07-21 DIAGNOSIS — M7652 Patellar tendinitis, left knee: Secondary | ICD-10-CM

## 2020-07-21 MED ORDER — PREDNISONE 10 MG PO TABS: ORAL_TABLET | ORAL | 0 refills | Status: DC

## 2020-07-21 MED ORDER — DICLOFENAC SODIUM 1 % EX GEL: 2.0000 g | Freq: Four times a day (QID) | CUTANEOUS | 0 refills | Status: DC

## 2020-07-21 NOTE — ED Provider Notes (Signed)
[facility]    CSN: [id number] Arrival date & time: 07/21/20  0955      History   Chief Complaint Chief Complaint  Patient presents with   Knee Pain    HPI Kelly Grant is a 55 y.o. female presenting today for evaluation of left knee pain.  2 weeks of left knee pain without specific injury or trauma.  Reports looking heavily approximately 1 week prior to onset of symptoms.  Denies direct trauma or fall.  Denies history of prior knee problems.  Pain worse with standing, going up stairs or bending.  Denies any popping tearing or catching sensations.  Denies sensation of instability.  Using Tylenol arthritis, ibuprofen and Aspercreme without relief.  HPI  Past Medical History:  Diagnosis Date   Abnormal Pap smear    ASC-H   Chlamydia    History of chicken pox    HSV infection    Multiple sclerosis ([place])    Neuromuscular disorder ([place])    MS   Yeast infection     Patient Active Problem List   Diagnosis Date Noted   Encounter for therapeutic drug monitoring 12/13/2014   Multiple sclerosis ([place]) 05/08/2012   Pap smear abnormality of cervix 06/22/2011   HSV infection 06/22/2011    Past Surgical History:  Procedure Laterality Date   CARPAL TUNNEL RELEASE     CESAREAN SECTION     x2   INCISION AND DRAINAGE ABSCESS     left buttock   TUBAL LIGATION      OB History     Gravida  3   Para  2   Term      Preterm      AB  1   Living  2      SAB      IAB  1   Ectopic      Multiple      Live Births  2            Home Medications    Prior to Admission medications   Medication Sig Start Date End Date Taking? Authorizing Provider  aspirin EC 81 MG tablet Take 1 tablet (81 mg total) by mouth daily. 02/15/17  Yes Dorena Dew, FNP  calcium-vitamin D (OSCAL WITH D) 500-200 MG-UNIT tablet Take 2 tablets by mouth.    Yes [provider]  carboxymethylcellulose (REFRESH PLUS) 0.5 % SOLN 1 drop daily as needed.   Yes  [provider]  cycloSPORINE (RESTASIS) 0.05 % ophthalmic emulsion 1 drop 2 (two) times daily.   Yes [provider]  diclofenac Sodium (VOLTAREN) 1 % GEL Apply 2 g topically 4 (four) times daily. 07/21/20  Yes Mireyah Chervenak C, PA-C  Fingolimod HCl (GILENYA) 0.5 MG CAPS Take 1 daily 07/19/20  Yes Suzzanne Cloud, NP  Multiple Vitamin (MULTIVITAMIN) tablet Take 1 tablet by mouth daily. 04/29/15  Yes Dorena Dew, FNP  omega-3 acid ethyl esters (LOVAZA) 1 g capsule Take 1 capsule (1 g total) by mouth daily. 02/17/20  Yes Vevelyn Francois, NP  predniSONE (DELTASONE) 10 MG tablet Begin with 6 tabs on day 1, 5 tab on day 2, 4 tab on day 3, 3 tab on day 4, 2 tab on day 5, 1 tab on day 6-take with food 07/21/20  Yes Tinika Bucknam C, PA-C  rosuvastatin (CRESTOR) 10 MG tablet TAKE 1 TABLET(10 MG) BY MOUTH AT BEDTIME 02/17/20  Yes Vevelyn Francois, NP  valACYclovir (VALTREX) 500 MG tablet  TAKE 1 TABLET(500 MG) BY MOUTH DAILY 05/10/20  Yes Vevelyn Francois, NP  Wheat Dextrin (BENEFIBER DRINK MIX PO) Take by mouth. 2 tsp TID   Yes [provider]    Family History Family History  Problem Relation Age of Onset   Kidney failure Mother    Diabetes Mother    Colon cancer Neg Hx     Social History Social History   Tobacco Use   Smoking status: Former    Pack years: 0.00    Types: Cigarettes    Quit date: 01/23/2000    Years since quitting: 20.5  Vaping Use   Vaping Use: Never used  Substance Use Topics   Alcohol use: Yes    Comment: rarely   Drug use: No     Allergies   Macrobid [nitrofurantoin macrocrystal]   Review of Systems Review of Systems  Constitutional:  Negative for fatigue and fever.  Eyes:  Negative for visual disturbance.  Respiratory:  Negative for shortness of breath.   Cardiovascular:  Negative for chest pain.  Gastrointestinal:  Negative for abdominal pain, nausea and vomiting.  Musculoskeletal:  Positive for arthralgias. Negative for joint  swelling.  Skin:  Negative for color change, rash and wound.  Neurological:  Negative for dizziness, weakness, light-headedness and headaches.    Physical Exam Triage Vital Signs ED Triage Vitals  Enc Vitals Group     BP      Pulse      Resp      Temp      Temp src      SpO2      Weight      Height      Head Circumference      Peak Flow      Pain Score      Pain Loc      Pain Edu?      Excl. in [place]?    No data found.  Updated Vital Signs BP (!) 150/84   Pulse 76   Temp 97.6 F (36.4 C) (Temporal)   Resp 16   SpO2 98%   Visual Acuity Right Eye Distance:   Left Eye Distance:   Bilateral Distance:    Right Eye Near:   Left Eye Near:    Bilateral Near:     Physical Exam Vitals and nursing note reviewed.  Constitutional:      Appearance: She is well-developed.     Comments: No acute distress  HENT:     Head: Normocephalic and atraumatic.     Nose: Nose normal.  Eyes:     Conjunctiva/sclera: Conjunctivae normal.  Cardiovascular:     Rate and Rhythm: Normal rate.  Pulmonary:     Effort: Pulmonary effort is normal. No respiratory distress.  Abdominal:     General: There is no distension.  Musculoskeletal:        General: Normal range of motion.     Cervical back: Neck supple.     Comments: Left knee: No obvious swelling deformity, no discoloration erythema or warmth, mild tenderness to palpation over inferior portion of patella and into patellar tendon area, nontender to medial and lateral joint line, nontender to popliteal area, full flexion, increased pain with extension, no laxity appreciated with special testing  Skin:    General: Skin is warm and dry.  Neurological:     Mental Status: She is alert and oriented to person, place, and time.     UC Treatments / Results  Labs (all  labs ordered are listed, but only abnormal results are displayed) Labs Reviewed - No data to display  EKG   Radiology No results found.  Procedures Procedures  (including critical care time)  Medications Ordered in UC Medications - No data to display  Initial Impression / Assessment and Plan / UC Course  I have reviewed the triage vital signs and the nursing notes.  Pertinent labs & imaging results that were available during my care of the patient were reviewed by me and considered in my medical decision making (see chart for details).     Acute left knee pain-no mechanism of injury, do not suspect acute bony abnormality, possible patellar tendinitis versus patellofemoral syndrome versus underlying arthritis.  Has been using NSAIDs without relief, will place on 6-day prednisone taper and topical diclofenac ice elevate and knee brace provided.  Encouraged follow-up with sports medicine if persistent.  Discussed strict return precautions. Patient verbalized understanding and is agreeable with plan.   Final Clinical Impressions(s) / UC Diagnoses   Final diagnoses:  Acute pain of left knee  Patellar tendonitis of left knee     Discharge Instructions      Begin prednisone taper x6 days-begin with 6 tablets on day 1, decrease by 1 tablet each day until complete-6, 5, 4, 3, 2, 1-take with food and earlier in the day if possible May apply diclofenac cream topically to knee 4 times daily Ice and elevate Knee brace with walking/work Follow-up with sports medicine if not improving or worsening     ED Prescriptions     Medication Sig Dispense Auth. Provider   predniSONE (DELTASONE) 10 MG tablet Begin with 6 tabs on day 1, 5 tab on day 2, 4 tab on day 3, 3 tab on day 4, 2 tab on day 5, 1 tab on day 6-take with food 21 tablet Helio Lack C, PA-C   diclofenac Sodium (VOLTAREN) 1 % GEL Apply 2 g topically 4 (four) times daily. 150 g Kineta Fudala, Jena C, PA-C      PDMP not reviewed this encounter.   Janith Lima, [place] 07/21/20 1151

## 2020-07-21 NOTE — Discharge Instructions (Addendum)
Begin prednisone taper x6 days-begin with 6 tablets on day 1, decrease by 1 tablet each day until complete-6, 5, 4, 3, 2, 1-take with food and earlier in the day if possible May apply diclofenac cream topically to knee 4 times daily Ice and elevate Knee brace with walking/work Follow-up with sports medicine if not improving or worsening

## 2020-07-21 NOTE — ED Triage Notes (Signed)
C/O left knee pain onset approx 2 wks ago without improvement despite Tylenol Arthritis, IBU, Aspercreme. Denies injury.  Ambulates with slight limp.

## 2020-07-28 DIAGNOSIS — H04123 Dry eye syndrome of bilateral lacrimal glands: Secondary | ICD-10-CM | POA: Diagnosis not present

## 2020-07-28 DIAGNOSIS — H2513 Age-related nuclear cataract, bilateral: Secondary | ICD-10-CM | POA: Diagnosis not present

## 2020-07-28 DIAGNOSIS — G35 Multiple sclerosis: Secondary | ICD-10-CM | POA: Diagnosis not present

## 2020-08-10 ENCOUNTER — Other Ambulatory Visit: Payer: Self-pay

## 2020-08-10 ENCOUNTER — Telehealth: Payer: Self-pay

## 2020-08-10 DIAGNOSIS — B009 Herpesviral infection, unspecified: Secondary | ICD-10-CM

## 2020-08-10 MED ORDER — VALACYCLOVIR HCL 500 MG PO TABS: ORAL_TABLET | ORAL | 0 refills | Status: DC

## 2020-08-10 NOTE — Telephone Encounter (Signed)
Valtrex

## 2020-08-11 NOTE — Telephone Encounter (Signed)
Medication was refilled.

## 2020-09-21 ENCOUNTER — Ambulatory Visit: Payer: BC Managed Care – PPO | Admitting: Nurse Practitioner

## 2020-09-28 ENCOUNTER — Encounter: Payer: Self-pay | Admitting: Nurse Practitioner

## 2020-09-28 ENCOUNTER — Other Ambulatory Visit: Payer: Self-pay

## 2020-09-28 ENCOUNTER — Ambulatory Visit (INDEPENDENT_AMBULATORY_CARE_PROVIDER_SITE_OTHER): Payer: BC Managed Care – PPO | Admitting: Nurse Practitioner

## 2020-09-28 VITALS — BP 130/87 | HR 71 | Temp 98.1°F | Wt 153.4 lb

## 2020-09-28 DIAGNOSIS — D229 Melanocytic nevi, unspecified: Secondary | ICD-10-CM

## 2020-09-28 DIAGNOSIS — M25562 Pain in left knee: Secondary | ICD-10-CM | POA: Diagnosis not present

## 2020-09-28 DIAGNOSIS — E785 Hyperlipidemia, unspecified: Secondary | ICD-10-CM | POA: Diagnosis not present

## 2020-09-28 NOTE — Patient Instructions (Addendum)
Salonpas patch for knee pain  Acute Knee Pain, Adult Acute knee pain is sudden and may be caused by damage, swelling, or irritation of the muscles and tissues that support the knee. Pain may result from: A fall. An injury to the knee from twisting motions. A hit to the knee. Infection. Acute knee pain may go away on its own with time and rest. If it does not, your health care provider may order tests to find the cause of the pain. These may include: Imaging tests, such as an X-ray, MRI, CT scan, or ultrasound. Joint aspiration. In this test, fluid is removed from the knee and evaluated. Arthroscopy. In this test, a lighted tube is inserted into the knee and an image is projected onto a TV screen. Biopsy. In this test, a sample of tissue is removed from the body and studied under a microscope. Follow these instructions at home: If you have a knee sleeve or brace:  Wear the knee sleeve or brace as told by your health care provider. Remove it only as told by your health care provider. Loosen it if your toes tingle, become numb, or turn cold and blue. Keep it clean. If the knee sleeve or brace is not waterproof: Do not let it get wet. Cover it with a watertight covering when you take a bath or shower. Activity Rest your knee. Do not do things that cause pain or make pain worse. Avoid high-impact activities or exercises, such as running, jumping rope, or doing jumping jacks. Work with a physical therapist to make a safe exercise program, as recommended by your health care provider. Do exercises as told by your physical therapist. Managing pain, stiffness, and swelling  If directed, put ice on the affected knee. To do this: If you have a removable knee sleeve or brace, remove it as told by your health care provider. Put ice in a plastic bag. Place a towel between your skin and the bag. Leave the ice on for 20 minutes, 2-3 times a day. Remove the ice if your skin turns bright red. This is  very important. If you cannot feel pain, heat, or cold, you have a greater risk of damage to the area. If directed, use an elastic bandage to put pressure (compression) on your injured knee. This may control swelling, give support, and help with discomfort. Raise (elevate) your knee above the level of your heart while you are sitting or lying down. Sleep with a pillow under your knee. General instructions Take over-the-counter and prescription medicines only as told by your health care provider. Do not use any products that contain nicotine or tobacco, such as cigarettes, e-cigarettes, and chewing tobacco. If you need help quitting, ask your health care provider. If you are overweight, work with your health care provider and a dietitian to set a weight-loss goal that is healthy and reasonable for you. Extra weight can put pressure on your knee. Pay attention to any changes in your symptoms. Keep all follow-up visits. This is important. Contact a health care provider if: Your knee pain continues, changes, or gets worse. You have a fever along with knee pain. Your knee feels warm to the touch or is red. Your knee buckles or locks up. Get help right away if: Your knee swells, and the swelling becomes worse. You cannot move your knee. You have severe pain in your knee that cannot be managed with pain medicine. Summary Acute knee pain can be caused by a fall, an injury, an  infection, or damage, swelling, or irritation of the tissues that support your knee. Your health care provider may perform tests to find out the cause of the pain. Pay attention to any changes in your symptoms. Relieve your pain with rest, medicines, light activity, and the use of ice. Get help right away if your knee swells, you cannot move your knee, or you have severe pain that cannot be managed with medicine. This information is not intended to replace advice given to you by your health care provider. Make sure you discuss  any questions you have with your health care provider. Document Revised: 06/24/2019 Document Reviewed: 06/24/2019 Elsevier Patient Education  [place].  Knee Exercises Ask your health care provider which exercises are safe for you. Do exercises exactly as told by your health care provider and adjust them as directed. It is normal to feel mild stretching, pulling, tightness, or discomfort as you do these exercises. Stop right away if you feel sudden pain or your pain gets worse. Do not begin these exercises until told by your health care provider. Stretching and range-of-motion exercises These exercises warm up your muscles and joints and improve the movement and flexibility of your knee. These exercises also help to relieve pain and swelling. Knee extension, prone Lie on your abdomen (prone position) on a bed. Place your left / right knee just beyond the edge of the surface so your knee is not on the bed. You can put a towel under your left / right thigh just above your kneecap for comfort. Relax your leg muscles and allow gravity to straighten your knee (extension). You should feel a stretch behind your left / right knee. Hold this position for __________ seconds. Scoot up so your knee is supported between repetitions. Repeat __________ times. Complete this exercise __________ times a day. Knee flexion, active  Lie on your back with both legs straight. If this causes back discomfort, bend your left / right knee so your foot is flat on the floor. Slowly slide your left / right heel back toward your buttocks. Stop when you feel a gentle stretch in the front of your knee or thigh (flexion). Hold this position for __________ seconds. Slowly slide your left / right heel back to the starting position. Repeat __________ times. Complete this exercise __________ times a day. Quadriceps stretch, prone  Lie on your abdomen on a firm surface, such as a bed or padded floor. Bend your left /  right knee and hold your ankle. If you cannot reach your ankle or pant leg, loop a belt around your foot and grab the belt instead. Gently pull your heel toward your buttocks. Your knee should not slide out to the side. You should feel a stretch in the front of your thigh and knee (quadriceps). Hold this position for __________ seconds. Repeat __________ times. Complete this exercise __________ times a day. Hamstring, supine Lie on your back (supine position). Loop a belt or towel over the ball of your left / right foot. The ball of your foot is on the walking surface, right under your toes. Straighten your left / right knee and slowly pull on the belt to raise your leg until you feel a gentle stretch behind your knee (hamstring). Do not let your knee bend while you do this. Keep your other leg flat on the floor. Hold this position for __________ seconds. Repeat __________ times. Complete this exercise __________ times a day. Strengthening exercises These exercises build strength and endurance  in your knee. Endurance is the ability to use your muscles for a long time, even after they get tired. Quadriceps, isometric This exercise stretches the muscles in front of your thigh (quadriceps) without moving your knee joint (isometric). Lie on your back with your left / right leg extended and your other knee bent. Put a rolled towel or small pillow under your knee if told by your health care provider. Slowly tense the muscles in the front of your left / right thigh. You should see your kneecap slide up toward your hip or see increased dimpling just above the knee. This motion will push the back of the knee toward the floor. For __________ seconds, hold the muscle as tight as you can without increasing your pain. Relax the muscles slowly and completely. Repeat __________ times. Complete this exercise __________ times a day. Straight leg raises This exercise stretches the muscles in front of your thigh  (quadriceps) and the muscles that move your hips (hip flexors). Lie on your back with your left / right leg extended and your other knee bent. Tense the muscles in the front of your left / right thigh. You should see your kneecap slide up or see increased dimpling just above the knee. Your thigh may even shake a bit. Keep these muscles tight as you raise your leg 4-6 inches (10-15 cm) off the floor. Do not let your knee bend. Hold this position for __________ seconds. Keep these muscles tense as you lower your leg. Relax your muscles slowly and completely after each repetition. Repeat __________ times. Complete this exercise __________ times a day. Hamstring, isometric Lie on your back on a firm surface. Bend your left / right knee about __________ degrees. Dig your left / right heel into the surface as if you are trying to pull it toward your buttocks. Tighten the muscles in the back of your thighs (hamstring) to "dig" as hard as you can without increasing any pain. Hold this position for __________ seconds. Release the tension gradually and allow your muscles to relax completely for __________ seconds after each repetition. Repeat __________ times. Complete this exercise __________ times a day. Hamstring curls If told by your health care provider, do this exercise while wearing ankle weights. Begin with __________ lb weights. Then increase the weight by 1 lb (0.5 kg) increments. Do not wear ankle weights that are more than __________ lb. Lie on your abdomen with your legs straight. Bend your left / right knee as far as you can without feeling pain. Keep your hips flat against the floor. Hold this position for __________ seconds. Slowly lower your leg to the starting position. Repeat __________ times. Complete this exercise __________ times a day. Squats This exercise strengthens the muscles in front of your thigh and knee (quadriceps). Stand in front of a table, with your feet and knees  pointing straight ahead. You may rest your hands on the table for balance but not for support. Slowly bend your knees and lower your hips like you are going to sit in a chair. Keep your weight over your heels, not over your toes. Keep your lower legs upright so they are parallel with the table legs. Do not let your hips go lower than your knees. Do not bend lower than told by your health care provider. If your knee pain increases, do not bend as low. Hold the squat position for __________ seconds. Slowly push with your legs to return to standing. Do not use your hands to  pull yourself to standing. Repeat __________ times. Complete this exercise __________ times a day. Wall slides This exercise strengthens the muscles in front of your thigh and knee (quadriceps). Lean your back against a smooth wall or door, and walk your feet out 18-24 inches (46-61 cm) from it. Place your feet hip-width apart. Slowly slide down the wall or door until your knees bend __________ degrees. Keep your knees over your heels, not over your toes. Keep your knees in line with your hips. Hold this position for __________ seconds. Repeat __________ times. Complete this exercise __________ times a day. Straight leg raises This exercise strengthens the muscles that rotate the leg at the hip and move it away from your body (hip abductors). Lie on your side with your left / right leg in the top position. Lie so your head, shoulder, knee, and hip line up. You may bend your bottom knee to help you keep your balance. Roll your hips slightly forward so your hips are stacked directly over each other and your left / right knee is facing forward. Leading with your heel, lift your top leg 4-6 inches (10-15 cm). You should feel the muscles in your outer hip lifting. Do not let your foot drift forward. Do not let your knee roll toward the ceiling. Hold this position for __________ seconds. Slowly return your leg to the starting  position. Let your muscles relax completely after each repetition. Repeat __________ times. Complete this exercise __________ times a day. Straight leg raises This exercise stretches the muscles that move your hips away from the front of the pelvis (hip extensors). Lie on your abdomen on a firm surface. You can put a pillow under your hips if that is more comfortable. Tense the muscles in your buttocks and lift your left / right leg about 4-6 inches (10-15 cm). Keep your knee straight as you lift your leg. Hold this position for __________ seconds. Slowly lower your leg to the starting position. Let your leg relax completely after each repetition. Repeat __________ times. Complete this exercise __________ times a day. This information is not intended to replace advice given to you by your health care provider. Make sure you discuss any questions you have with your health care provider. Document Revised: 10/29/2017 Document Reviewed: 10/29/2017 Elsevier Patient Education  2022 [employer].

## 2020-09-28 NOTE — Progress Notes (Signed)
[place], [place]  [address] Phone:  [phone]   Fax:  [phone]   Established Patient Office Visit  Subjective:  Patient ID: Kelly Grant, female    DOB: [birth year]  Age: 55 y.o. MRN: [id number]  CC:  Chief Complaint  Patient presents with   Follow-up    Pt is here today for her follow up visit. Pt states that she still has pain in her left knee off and on.    HPI Kelly Grant presents for follow up. She  has a past medical history of Abnormal Pap smear, Chlamydia, History of chicken pox, HSV infection, Multiple sclerosis ([place]), Neuromuscular disorder ([place]), and Yeast infection.   She reports that the left knee pain continues. She is not able   Knee Pain Patient presents with knee pain involving the left knee. Onset of the symptoms was several months ago. Inciting event: none known. Current symptoms include locking and pain located under knee cap . Pain is aggravated by standing. Patient has had no prior knee problems. Evaluation to date: none. Treatment to date: brace which is somewhat effective, OTC analgesics which are somewhat effective, rest, and oral steroid which was effective for the swelling .  She was seen by dermatology and the left facial growth was removed without difficulty.   Patient presents for evaluation of lipids. Compliance with treatment thus far has been good. A repeat fasting lipid profile was done. The patient does not use medications that may worsen dyslipidemias (corticosteroids, progestins, anabolic steroids, diuretics, beta-blockers, amiodarone, cyclosporine, olanzapine). The patient exercises infrequently due to full time work schedule.   The patient is not known to have coexisting coronary artery disease Past Medical History:  Diagnosis Date   Abnormal Pap smear    ASC-H   Chlamydia    History of chicken pox    HSV infection    Multiple sclerosis (HCC)    Neuromuscular disorder ([place])    MS    Yeast infection     Past Surgical History:  Procedure Laterality Date   CARPAL TUNNEL RELEASE     CESAREAN SECTION     x2   INCISION AND DRAINAGE ABSCESS     left buttock   TUBAL LIGATION      Family History  Problem Relation Age of Onset   Kidney failure Mother    Diabetes Mother    Colon cancer Neg Hx     Social History   Socioeconomic History   Marital status: Single    Spouse name: Not on file   Number of children: 2   Years of education: College    Highest education level: Not on file  Occupational History   Occupation: HOUSEKEEPING    Employer: [employer]  Tobacco Use   Smoking status: Former    Types: Cigarettes    Quit date: 01/23/2000    Years since quitting: 20.6   Smokeless tobacco: Not on file  Vaping Use   Vaping Use: Never used  Substance and Sexual Activity   Alcohol use: Yes    Comment: rarely   Drug use: No   Sexual activity: Not on file  Other Topics Concern   Not on file  Social History Narrative   Patient lives at home with children.    Patient has 2 children.    Patient has a college degree.    Patient is single.    Patient is right handed.    Patient is  currently employed.    Social Determinants of Health   Financial Resource Strain: Not on file  Food Insecurity: Not on file  Transportation Needs: Not on file  Physical Activity: Not on file  Stress: Not on file  Social Connections: Not on file  Intimate Partner Violence: Not on file    Outpatient Medications Prior to Visit  Medication Sig Dispense Refill   aspirin EC 81 MG tablet Take 1 tablet (81 mg total) by mouth daily. 30 tablet 11   calcium-vitamin D (OSCAL WITH D) 500-200 MG-UNIT tablet Take 2 tablets by mouth.      carboxymethylcellulose (REFRESH PLUS) 0.5 % SOLN 1 drop daily as needed.     cycloSPORINE (RESTASIS) 0.05 % ophthalmic emulsion 1 drop 2 (two) times daily.     diclofenac Sodium (VOLTAREN) 1 % GEL Apply 2 g topically 4 (four) times daily. 150 g 0    Fingolimod HCl (GILENYA) 0.5 MG CAPS Take 1 daily 90 capsule 3   Multiple Vitamin (MULTIVITAMIN) tablet Take 1 tablet by mouth daily. 30 tablet 3   omega-3 acid ethyl esters (LOVAZA) 1 g capsule Take 1 capsule (1 g total) by mouth daily. 90 capsule 3   rosuvastatin (CRESTOR) 10 MG tablet TAKE 1 TABLET(10 MG) BY MOUTH AT BEDTIME 90 tablet 3   valACYclovir (VALTREX) 500 MG tablet TAKE 1 TABLET(500 MG) BY MOUTH DAILY 90 tablet 0   Wheat Dextrin (BENEFIBER DRINK MIX PO) Take by mouth. 2 tsp TID     predniSONE (DELTASONE) 10 MG tablet Begin with 6 tabs on day 1, 5 tab on day 2, 4 tab on day 3, 3 tab on day 4, 2 tab on day 5, 1 tab on day 6-take with food 21 tablet 0   No facility-administered medications prior to visit.    Allergies  Allergen Reactions   Macrobid [Nitrofurantoin Macrocrystal] Hives    ROS Review of Systems    Objective:    Physical Exam Constitutional:      General: She is not in acute distress.    Appearance: Normal appearance.  HENT:     Head: Normocephalic and atraumatic.     Nose: Nose normal.     Mouth/Throat:     Mouth: Mucous membranes are moist.  Cardiovascular:     Rate and Rhythm: Normal rate and regular rhythm.     Pulses: Normal pulses.     Heart sounds: Normal heart sounds.  Pulmonary:     Effort: Pulmonary effort is normal.     Breath sounds: Normal breath sounds.  Abdominal:     General: Bowel sounds are normal.     Palpations: Abdomen is soft.  Musculoskeletal:        General: Tenderness present. No swelling or deformity. Normal range of motion.     Right lower leg: No edema.     Left lower leg: No edema.     Comments: 3/5 LLE  Skin:    General: Skin is warm and dry.     Capillary Refill: Capillary refill takes less than 2 seconds.  Neurological:     General: No focal deficit present.     Mental Status: She is alert.  Psychiatric:        Mood and Affect: Mood normal.        Behavior: Behavior normal.        Thought Content: Thought  content normal.        Judgment: Judgment normal.    BP 130/87   Pulse  71   Temp 98.1 F (36.7 C)   Wt 153 lb 6.4 oz (69.6 kg)   SpO2 98%   BMI 26.33 kg/m  Wt Readings from Last 3 Encounters:  09/28/20 153 lb 6.4 oz (69.6 kg)  07/19/20 150 lb (68 kg)  03/31/20 149 lb 3.2 oz (67.7 kg)     Health Maintenance Due  Topic Date Due   Pneumococcal Vaccine 46-48 Years old (1 - PCV) Never done   Zoster Vaccines- Shingrix (1 of 2) Never done   COVID-19 Vaccine (4 - Booster for Pfizer series) 03/27/2020    There are no preventive care reminders to display for this patient.  Lab Results  Component Value Date   TSH 0.927 12/19/2016   Lab Results  Component Value Date   WBC 4.6 07/19/2020   HGB 13.2 07/19/2020   HCT 40.2 07/19/2020   MCV 97 07/19/2020   PLT 256 07/19/2020   Lab Results  Component Value Date   NA 143 07/19/2020   K 4.1 07/19/2020   CO2 24 07/19/2020   GLUCOSE 86 07/19/2020   BUN 10 07/19/2020   CREATININE 0.77 07/19/2020   BILITOT 0.4 07/19/2020   ALKPHOS 86 07/19/2020   AST 39 07/19/2020   ALT 44 (H) 07/19/2020   PROT 6.4 07/19/2020   ALBUMIN 4.5 07/19/2020   CALCIUM 9.7 07/19/2020   EGFR 90 07/19/2020   Lab Results  Component Value Date   CHOL 177 02/17/2020   Lab Results  Component Value Date   HDL 65 02/17/2020   Lab Results  Component Value Date   LDLCALC 98 02/17/2020   Lab Results  Component Value Date   TRIG 77 02/17/2020   Lab Results  Component Value Date   CHOLHDL 2.7 02/17/2020   Lab Results  Component Value Date   HGBA1C 5.2 02/17/2016      Assessment & Plan:   Problem List Items Addressed This Visit   None Visit Diagnoses     Hyperlipidemia, unspecified hyperlipidemia type    -  Primary Stable  Continue with current regimen.  No changes warranted. Good patient compliance.    Nevus    resolved   Acute pain of left knee    Persistent Xray pending  Exercises provided Encourage PT due to weakness in left leg     Relevant Orders   DG Knee Complete 4 Views Left       No orders of the defined types were placed in this encounter.   Follow-up: Return in about 6 months (around 03/28/2021) for HLD 99213.    Vevelyn Francois, NP

## 2020-09-29 LAB — POCT URINALYSIS DIP (CLINITEK)
Bilirubin, UA: NEGATIVE
Blood, UA: NEGATIVE
Glucose, UA: NEGATIVE mg/dL
Ketones, POC UA: NEGATIVE mg/dL
Leukocytes, UA: NEGATIVE
Nitrite, UA: NEGATIVE
POC PROTEIN,UA: NEGATIVE
Spec Grav, UA: 1.02 (ref 1.010–1.025)
Urobilinogen, UA: 0.2 E.U./dL
pH, UA: 6.5 (ref 5.0–8.0)

## 2020-09-29 NOTE — Addendum Note (Signed)
Addended by: Blair Heys L on: 09/29/2020 09:48 AM   Modules accepted: Orders

## 2020-11-11 ENCOUNTER — Other Ambulatory Visit: Payer: Self-pay

## 2020-11-11 DIAGNOSIS — B009 Herpesviral infection, unspecified: Secondary | ICD-10-CM

## 2020-11-11 DIAGNOSIS — Z1231 Encounter for screening mammogram for malignant neoplasm of breast: Secondary | ICD-10-CM | POA: Diagnosis not present

## 2020-11-11 MED ORDER — VALACYCLOVIR HCL 500 MG PO TABS: ORAL_TABLET | ORAL | 0 refills | Status: DC

## 2021-01-11 ENCOUNTER — Encounter: Payer: Self-pay | Admitting: Dermatology

## 2021-01-11 ENCOUNTER — Other Ambulatory Visit: Payer: Self-pay

## 2021-01-11 ENCOUNTER — Ambulatory Visit (INDEPENDENT_AMBULATORY_CARE_PROVIDER_SITE_OTHER): Payer: BC Managed Care – PPO | Admitting: Dermatology

## 2021-01-11 DIAGNOSIS — L819 Disorder of pigmentation, unspecified: Secondary | ICD-10-CM

## 2021-01-11 DIAGNOSIS — L820 Inflamed seborrheic keratosis: Secondary | ICD-10-CM

## 2021-02-01 ENCOUNTER — Encounter: Payer: Self-pay | Admitting: Neurology

## 2021-02-01 ENCOUNTER — Ambulatory Visit (INDEPENDENT_AMBULATORY_CARE_PROVIDER_SITE_OTHER): Payer: BC Managed Care – PPO | Admitting: Neurology

## 2021-02-01 VITALS — BP 144/88 | HR 65 | Ht 64.0 in | Wt 154.0 lb

## 2021-02-01 DIAGNOSIS — G35 Multiple sclerosis: Secondary | ICD-10-CM

## 2021-02-01 NOTE — Progress Notes (Signed)
PATIENT: Kelly Grant DOB: 1965/03/25  REASON FOR VISIT: follow up MS HISTORY FROM: patient Primary Neurologist: Dr. Krista Blue   HISTORY OF PRESENT ILLNESS: Today 02/01/21 Kelly Grant here today for follow-up with history of RRMS on Gilenya.  Continues to do well.  Has no new MS symptoms.  Continues to work full-time, her job is physical, may have weakness of left arm with overuse.  No falls.  Her health has been good.  Here today unaccompanied.  Update 07/19/2020 SS: Kelly Grant is a 56 year old female with history of RRMS on Gilenya.  MRI of the brain in July 2021 was overall stable compared to previous in June 2018.  There were no new lesions.  Remains overall stable.  With overuse, will note weakness to the left arm.  She works for [employer], is physical job. Works 6 PM-3 AM. 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 No falls.  Has annual evaluation with Dr. Katy Fitch.  Denies any new problems or concerns.  Feels MS is overall stable.  REVIEW OF SYSTEMS: Out of a complete 14 system review of symptoms, the patient complains only of the following symptoms, and all other reviewed systems are negative.  N/A  ALLERGIES: Allergies  Allergen Reactions   Macrobid [Nitrofurantoin Macrocrystal] Hives    HOME MEDICATIONS: Outpatient Medications Prior to Visit  Medication Sig Dispense Refill   aspirin EC 81 MG tablet Take 1 tablet (81 mg total) by mouth daily. 30 tablet 11   calcium-vitamin D (OSCAL WITH D) 500-200 MG-UNIT tablet Take 2 tablets by mouth.      carboxymethylcellulose (REFRESH PLUS) 0.5 % SOLN 1 drop daily as needed.     cycloSPORINE (RESTASIS) 0.05 % ophthalmic emulsion 1 drop 2 (two) times daily.     diclofenac Sodium (VOLTAREN) 1 % GEL Apply 2 g topically 4 (four) times daily. 150 g 0   Fingolimod HCl (GILENYA) 0.5 MG CAPS Take 1 daily 90 capsule 3   Multiple Vitamin (MULTIVITAMIN) tablet Take 1 tablet by mouth daily. 30 tablet 3   omega-3 acid ethyl esters  (LOVAZA) 1 g capsule Take 1 capsule (1 g total) by mouth daily. 90 capsule 3   rosuvastatin (CRESTOR) 10 MG tablet TAKE 1 TABLET(10 MG) BY MOUTH AT BEDTIME 90 tablet 3   valACYclovir (VALTREX) 500 MG tablet TAKE 1 TABLET(500 MG) BY MOUTH DAILY 90 tablet 0   Wheat Dextrin (BENEFIBER DRINK MIX PO) Take by mouth. 2 tsp TID     No facility-administered medications prior to visit.    PAST MEDICAL HISTORY: Past Medical History:  Diagnosis Date   Abnormal Pap smear    ASC-H   Chlamydia    History of chicken pox    HSV infection    Multiple sclerosis ([place])    Neuromuscular disorder ([place])    MS   Yeast infection     PAST SURGICAL HISTORY: Past Surgical History:  Procedure Laterality Date   CARPAL TUNNEL RELEASE     CESAREAN SECTION     x2   INCISION AND DRAINAGE ABSCESS     left buttock   TUBAL LIGATION      FAMILY HISTORY: Family History  Problem Relation Age of Onset   Kidney failure Mother    Diabetes Mother    Colon cancer Neg Hx     SOCIAL HISTORY: Social History   Socioeconomic History   Marital status: Single    Spouse name: Not on file   Number of children: 2  Years of education: College    Highest education level: Not on file  Occupational History   Occupation: HOUSEKEEPING    Employer: [employer]  Tobacco Use   Smoking status: Former    Types: Cigarettes    Quit date: 01/23/2000    Years since quitting: 21.0   Smokeless tobacco: Not on file  Vaping Use   Vaping Use: Never used  Substance and Sexual Activity   Alcohol use: Yes    Comment: rarely   Drug use: No   Sexual activity: Not on file  Other Topics Concern   Not on file  Social History Narrative   Patient lives at home with children.    Patient has 2 children.    Patient has a college degree.    Patient is single.    Patient is right handed.    Patient is currently employed.    Social Determinants of Health   Financial Resource Strain: Not on file  Food Insecurity: Not on file   Transportation Needs: Not on file  Physical Activity: Not on file  Stress: Not on file  Social Connections: Not on file  Intimate Partner Violence: Not on file   PHYSICAL EXAM  Vitals:   02/01/21 1119  BP: (!) 144/88  Pulse: 65  Weight: 154 lb (69.9 kg)  Height: 5\' 4"  (1.626 m)   Body mass index is 26.43 kg/m.  Generalized: Well developed, in no acute distress   Neurological examination  Mentation: Alert oriented to time, place, history taking. Follows all commands speech and language fluent Cranial nerve II-XII: Pupils were equal round reactive to light. Extraocular movements were full, visual field were full on confrontational test. Facial sensation and strength were normal. Head turning and shoulder shrug were normal and symmetric. Motor: 5/5 strength all extremities, but 4/5 left upper extremity Sensory: Sensory testing is intact to soft touch on all 4 extremities. No evidence of extinction is noted.  Coordination: Cerebellar testing reveals good finger-nose-finger and heel-to-shin bilaterally.  Gait and station: Gait is normal. Tandem gait is normal.  Reflexes: Deep tendon reflexes are symmetric and normal bilaterally.   DIAGNOSTIC DATA (LABS, IMAGING, TESTING) - I reviewed patient records, labs, notes, testing and imaging myself where available.  Lab Results  Component Value Date   WBC 4.6 07/19/2020   HGB 13.2 07/19/2020   HCT 40.2 07/19/2020   MCV 97 07/19/2020   PLT 256 07/19/2020      Component Value Date/Time   NA 143 07/19/2020 1136   K 4.1 07/19/2020 1136   CL 105 07/19/2020 1136   CO2 24 07/19/2020 1136   GLUCOSE 86 07/19/2020 1136   GLUCOSE 80 02/17/2015 1353   BUN 10 07/19/2020 1136   CREATININE 0.77 07/19/2020 1136   CREATININE 0.77 02/17/2015 1353   CALCIUM 9.7 07/19/2020 1136   PROT 6.4 07/19/2020 1136   ALBUMIN 4.5 07/19/2020 1136   AST 39 07/19/2020 1136   ALT 44 (H) 07/19/2020 1136   ALKPHOS 86 07/19/2020 1136   BILITOT 0.4 07/19/2020  1136   GFRNONAA 93 01/19/2020 1039   GFRNONAA >89 02/17/2015 1353   GFRAA 107 01/19/2020 1039   GFRAA >89 02/17/2015 1353   Lab Results  Component Value Date   CHOL 177 02/17/2020   HDL 65 02/17/2020   LDLCALC 98 02/17/2020   TRIG 77 02/17/2020   CHOLHDL 2.7 02/17/2020   Lab Results  Component Value Date   HGBA1C 5.2 02/17/2016   Lab Results  Component Value Date  [id number] 682 02/18/2019   Lab Results  Component Value Date   TSH 0.927 12/19/2016    ASSESSMENT AND PLAN 56 y.o. year old female  has a past medical history of Abnormal Pap smear, Chlamydia, History of chicken pox, HSV infection, Multiple sclerosis ([place]), Neuromuscular disorder ([place]), and Yeast infection. here with:  1.  Relapsing remitting multiple sclerosis  -Najah continues to do well, MS is stable -Continue Gilenya, check routine labs today -MRI of the brain in July 2021 was overall stable, consider repeat MRI of the brain at next visit -Follow-up in 6 months or sooner if needed  Butler Denmark, AGNP-C, DNP 02/01/2021, 11:55 AM [facility] [address] [place], [place] [address] [phone]

## 2021-02-01 NOTE — Patient Instructions (Signed)
Great to see you today! Continue the Gilenya  Check labs today See you back in 6 months

## 2021-02-02 LAB — COMPREHENSIVE METABOLIC PANEL
ALT: 34 IU/L — ABNORMAL HIGH (ref 0–32)
AST: 37 IU/L (ref 0–40)
Albumin/Globulin Ratio: 2 (ref 1.2–2.2)
Albumin: 4.3 g/dL (ref 3.8–4.9)
Alkaline Phosphatase: 90 IU/L (ref 44–121)
BUN/Creatinine Ratio: 17 (ref 9–23)
BUN: 12 mg/dL (ref 6–24)
Bilirubin Total: 0.2 mg/dL (ref 0.0–1.2)
CO2: 23 mmol/L (ref 20–29)
Calcium: 9.3 mg/dL (ref 8.7–10.2)
Chloride: 105 mmol/L (ref 96–106)
Creatinine, Ser: 0.71 mg/dL (ref 0.57–1.00)
Globulin, Total: 2.2 g/dL (ref 1.5–4.5)
Glucose: 93 mg/dL (ref 70–99)
Potassium: 3.8 mmol/L (ref 3.5–5.2)
Sodium: 142 mmol/L (ref 134–144)
Total Protein: 6.5 g/dL (ref 6.0–8.5)
eGFR: 100 mL/min/{1.73_m2} (ref >59)

## 2021-02-02 LAB — CBC WITH DIFFERENTIAL/PLATELET
Basophils Absolute: 0 10*3/uL (ref 0.0–0.2)
Basos: 0 %
EOS (ABSOLUTE): 0.1 10*3/uL (ref 0.0–0.4)
Eos: 3 %
Hematocrit: 39.6 % (ref 34.0–46.6)
Hemoglobin: 13.2 g/dL (ref 11.1–15.9)
Immature Grans (Abs): 0 10*3/uL (ref 0.0–0.1)
Immature Granulocytes: 1 %
Lymphocytes Absolute: 0.9 10*3/uL (ref 0.7–3.1)
Lymphs: 23 %
MCH: 32.6 pg (ref 26.6–33.0)
MCHC: 33.3 g/dL (ref 31.5–35.7)
MCV: 98 fL — ABNORMAL HIGH (ref 79–97)
Monocytes Absolute: 0.6 10*3/uL (ref 0.1–0.9)
Monocytes: 16 %
Neutrophils Absolute: 2.3 10*3/uL (ref 1.4–7.0)
Neutrophils: 57 %
Platelets: 282 10*3/uL (ref 150–450)
RBC: 4.05 x10E6/uL (ref 3.77–5.28)
RDW: 13 % (ref 11.7–15.4)
WBC: 3.9 10*3/uL (ref 3.4–10.8)

## 2021-02-07 ENCOUNTER — Telehealth: Payer: Self-pay | Admitting: *Deleted

## 2021-02-07 NOTE — Telephone Encounter (Signed)
-----   Message from Suzzanne Cloud, NP sent at 02/07/2021  5:58 AM EST ----- Got a note patient has not viewed her my chart results and my message, please try to reach her.

## 2021-02-07 NOTE — Telephone Encounter (Signed)
Mychart message sent by Sarah:  Kelly Grant,  Blood work looks good on Gilenya. Liver enzyme ALT is better. Let me know if you have any questions! Sarah _____________________________________

## 2021-02-08 ENCOUNTER — Encounter: Payer: Self-pay | Admitting: Dermatology

## 2021-02-08 NOTE — Progress Notes (Signed)
° °  Follow-Up Visit   Subjective  CHRISTNE PLATTS is a 56 y.o. female who presents for the following: Follow-up (No new concerns. Follow up on isk removal from face in June and healed fine. ).  Recheck site of removal left cheek Location:  Duration:  Quality:  Associated Signs/Symptoms: Modifying Factors:  Severity:  Timing: Context:   Objective  Well appearing patient in no apparent distress; mood and affect are within normal limits. Left Parotid Area, Left Thigh - Posterior Biopsy showed a 1 cm inflamed keratosis.  There is no sign of residual or regrowth but as expected did heal with dyschromia (hyperpigmentation).  Patient states she is pleased with result.     A focused examination was performed including head and neck. Relevant physical exam findings are noted in the Assessment and Plan.   Assessment & Plan    Inflamed seborrheic keratosis (2) Left Thigh - Posterior; Left Parotid Area  Bx 06/2020 Area is clear.  Return as needed change      I, Lavonna Monarch, MD, have reviewed all documentation for this visit.  The documentation on 02/08/21 for the exam, diagnosis, procedures, and orders are all accurate and complete.

## 2021-02-17 ENCOUNTER — Telehealth: Payer: Self-pay

## 2021-02-17 ENCOUNTER — Other Ambulatory Visit: Payer: Self-pay | Admitting: Nurse Practitioner

## 2021-02-17 DIAGNOSIS — B009 Herpesviral infection, unspecified: Secondary | ICD-10-CM

## 2021-02-17 MED ORDER — VALACYCLOVIR HCL 500 MG PO TABS: ORAL_TABLET | ORAL | 0 refills | Status: DC

## 2021-02-17 NOTE — Telephone Encounter (Signed)
Valtrex

## 2021-02-17 NOTE — Telephone Encounter (Signed)
Refill sent.

## 2021-02-23 ENCOUNTER — Encounter: Payer: Self-pay | Admitting: Neurology

## 2021-03-22 ENCOUNTER — Encounter: Payer: Self-pay | Admitting: Gastroenterology

## 2021-03-29 ENCOUNTER — Encounter: Payer: Self-pay | Admitting: Nurse Practitioner

## 2021-03-29 ENCOUNTER — Other Ambulatory Visit: Payer: Self-pay

## 2021-03-29 ENCOUNTER — Ambulatory Visit (INDEPENDENT_AMBULATORY_CARE_PROVIDER_SITE_OTHER): Payer: BC Managed Care – PPO | Admitting: Nurse Practitioner

## 2021-03-29 VITALS — BP 134/80 | HR 63 | Temp 98.0°F | Ht 64.0 in | Wt 149.0 lb

## 2021-03-29 DIAGNOSIS — E785 Hyperlipidemia, unspecified: Secondary | ICD-10-CM

## 2021-03-29 DIAGNOSIS — Z1211 Encounter for screening for malignant neoplasm of colon: Secondary | ICD-10-CM | POA: Diagnosis not present

## 2021-03-29 DIAGNOSIS — G35 Multiple sclerosis: Secondary | ICD-10-CM

## 2021-03-29 NOTE — Progress Notes (Signed)
? ?[facility] ?[place], [place]  [address] ?Phone:  [phone]   Fax:  [phone] ? ? ?Established Patient Office Visit ? ?Subjective:  ?Patient ID: Kelly Grant, female    DOB: January 21, 1966  Age: 56 y.o. MRN: [id number] ? ?CC:  ?Chief Complaint  ?Patient presents with  ? Follow-up  ?  Pt is here for 6 month follow up . No issues or concenrs  ? ? ?HPI ?Kelly Grant presents for follow up. She . has a past medical history of Abnormal Pap smear, Chlamydia, History of chicken pox, HSV infection, Multiple sclerosis ([place]), Neuromuscular disorder ([place]), and Yeast infection.  ? ? She is here for follow up of dyslipidemia. The patient does use medications that may worsen dyslipidemias (corticosteroids, progestins, anabolic steroids, diuretics, beta-blockers, amiodarone, cyclosporine, olanzapine). She is active on her job. The patient is not known to have coexisting coronary artery disease.  ? ?Denies headache, dizziness, visual changes, shortness of breath, dyspnea on exertion, chest pain, nausea, vomiting or any edema.  ? ?She follows up with neurology for her multiple sclerosis.  He denies any new concerns or changes in treatment ? ?Past Medical History:  ?Diagnosis Date  ? Abnormal Pap smear   ? ASC-H  ? Chlamydia   ? History of chicken pox   ? HSV infection   ? Multiple sclerosis ([place])   ? Neuromuscular disorder ([place])   ? MS  ? Yeast infection   ? ? ?Past Surgical History:  ?Procedure Laterality Date  ? CARPAL TUNNEL RELEASE    ? CESAREAN SECTION    ? x2  ? INCISION AND DRAINAGE ABSCESS    ? left buttock  ? TUBAL LIGATION    ? ? ?Family History  ?Problem Relation Age of Onset  ? Kidney failure Mother   ? Diabetes Mother   ? Colon cancer Neg Hx   ? ? ?Social History  ? ?Socioeconomic History  ? Marital status: Single  ?  Spouse name: Not on file  ? Number of children: 2  ? Years of education: College   ? Highest education level: Not on file  ?Occupational History  ? Occupation:  HOUSEKEEPING  ?  Employer: [employer]  ?Tobacco Use  ? Smoking status: Former  ?  Types: Cigarettes  ?  Quit date: 01/23/2000  ?  Years since quitting: 21.2  ? Smokeless tobacco: Not on file  ?Vaping Use  ? Vaping Use: Never used  ?Substance and Sexual Activity  ? Alcohol use: Yes  ?  Comment: rarely  ? Drug use: No  ? Sexual activity: Not on file  ?Other Topics Concern  ? Not on file  ?Social History Narrative  ? Patient lives at home with children.   ? Patient has 2 children.   ? Patient has a college degree.   ? Patient is single.   ? Patient is right handed.   ? Patient is currently employed.   ? ?Social Determinants of Health  ? ?Financial Resource Strain: Not on file  ?Food Insecurity: Not on file  ?Transportation Needs: Not on file  ?Physical Activity: Not on file  ?Stress: Not on file  ?Social Connections: Not on file  ?Intimate Partner Violence: Not on file  ? ? ?Outpatient Medications Prior to Visit  ?Medication Sig Dispense Refill  ? aspirin EC 81 MG tablet Take 1 tablet (81 mg total) by mouth daily. 30 tablet 11  ? calcium-vitamin D (OSCAL WITH D) 500-200  MG-UNIT tablet Take 2 tablets by mouth.     ? carboxymethylcellulose (REFRESH PLUS) 0.5 % SOLN 1 drop daily as needed.    ? cycloSPORINE (RESTASIS) 0.05 % ophthalmic emulsion 1 drop 2 (two) times daily.    ? Fingolimod HCl (GILENYA) 0.5 MG CAPS Take 1 daily 90 capsule 3  ? Multiple Vitamin (MULTIVITAMIN) tablet Take 1 tablet by mouth daily. 30 tablet 3  ? omega-3 acid ethyl esters (LOVAZA) 1 g capsule Take 1 capsule (1 g total) by mouth daily. 90 capsule 3  ? rosuvastatin (CRESTOR) 10 MG tablet TAKE 1 TABLET(10 MG) BY MOUTH AT BEDTIME 90 tablet 3  ? valACYclovir (VALTREX) 500 MG tablet TAKE 1 TABLET(500 MG) BY MOUTH DAILY 90 tablet 0  ? Wheat Dextrin (BENEFIBER DRINK MIX PO) Take by mouth. 2 tsp TID    ? diclofenac Sodium (VOLTAREN) 1 % GEL Apply 2 g topically 4 (four) times daily. (Patient not taking: Reported on 03/29/2021) 150 g 0  ? ?No  facility-administered medications prior to visit.  ? ? ?Allergies  ?Allergen Reactions  ? Macrobid [Nitrofurantoin Macrocrystal] Hives  ? ? ?ROS ?Review of Systems  ?Cardiovascular:  Positive for chest pain (She slowed her breathing. She felt like this improved.).  ?Gastrointestinal:  Negative for constipation and nausea.  ?Genitourinary:  Negative for dysuria and frequency.  ?Neurological:  Positive for headaches.  ? ?  ?Objective:  ?  ?Physical Exam ?HENT:  ?   Head: Normocephalic and atraumatic.  ?   Nose: Nose normal.  ?   Mouth/Throat:  ?   Mouth: Mucous membranes are moist.  ?Cardiovascular:  ?   Rate and Rhythm: Normal rate and regular rhythm.  ?   Pulses: Normal pulses.  ?   Heart sounds: Normal heart sounds.  ?Pulmonary:  ?   Effort: Pulmonary effort is normal.  ?   Breath sounds: Normal breath sounds.  ?Abdominal:  ?   General: Bowel sounds are normal.  ?   Palpations: Abdomen is soft.  ?Musculoskeletal:     ?   General: Normal range of motion.  ?   Cervical back: Normal range of motion.  ?   Right lower leg: No edema.  ?   Left lower leg: No edema.  ?Skin: ?   General: Skin is warm and dry.  ?   Capillary Refill: Capillary refill takes less than 2 seconds.  ?Neurological:  ?   General: No focal deficit present.  ?   Mental Status: She is alert and oriented to person, place, and time.  ?Psychiatric:     ?   Mood and Affect: Mood normal.     ?   Behavior: Behavior normal.     ?   Thought Content: Thought content normal.     ?   Judgment: Judgment normal.  ? ? ?BP 134/80   Pulse 63   Temp 98 ?F (36.7 ?C)   Ht _0  (1.626 m)   Wt 149 lb (67.6 kg)   SpO2 98%   BMI 25.58 kg/m?  ?Wt Readings from Last 3 Encounters:  ?03/29/21 149 lb (67.6 kg)  ?02/01/21 154 lb (69.9 kg)  ?09/28/20 153 lb 6.4 oz (69.6 kg)  ? ? ? ?There are no preventive care reminders to display for this patient. ? ? ?There are no preventive care reminders to display for this patient. ? ?Lab Results  ?Component Value Date  ? TSH 0.927  12/19/2016  ? ?Lab Results  ?Component Value Date  ?  WBC 3.9 02/01/2021  ? HGB 13.2 02/01/2021  ? HCT 39.6 02/01/2021  ? MCV 98 (H) 02/01/2021  ? PLT 282 02/01/2021  ? ?Lab Results  ?Component Value Date  ? NA 142 02/01/2021  ? K 3.8 02/01/2021  ? CO2 23 02/01/2021  ? GLUCOSE 93 02/01/2021  ? BUN 12 02/01/2021  ? CREATININE 0.71 02/01/2021  ? BILITOT 0.2 02/01/2021  ? ALKPHOS 90 02/01/2021  ? AST 37 02/01/2021  ? ALT 34 (H) 02/01/2021  ? PROT 6.5 02/01/2021  ? ALBUMIN 4.3 02/01/2021  ? CALCIUM 9.3 02/01/2021  ? EGFR 100 02/01/2021  ? ?Lab Results  ?Component Value Date  ? CHOL 177 02/17/2020  ? ?Lab Results  ?Component Value Date  ? HDL 65 02/17/2020  ? ?Lab Results  ?Component Value Date  ? [place] 98 02/17/2020  ? ?Lab Results  ?Component Value Date  ? TRIG 77 02/17/2020  ? ?Lab Results  ?Component Value Date  ? CHOLHDL 2.7 02/17/2020  ? ?Lab Results  ?Component Value Date  ? HGBA1C 5.2 02/17/2016  ? ? ?  ?Assessment & Plan:  ? ?Problem List Items Addressed This Visit   ? ?  ? Nervous and Auditory  ? Multiple sclerosis ([place]) ?Stable continue to follow-up with neurology as scheduled  ? ?Other Visit Diagnoses   ? ? Hyperlipidemia, unspecified hyperlipidemia type    -  Primary ?Stable ?Stable  ?Encouraged on going compliance with current medication regimen. I recommend eating a heart-healthy diet; low in fat and cholesterol along with regular exercise. This will promote weight loss and improve cholesterol.  ?  ? Colon cancer screening      ? Relevant Orders  ? Ambulatory referral to Gastroenterology  ? ?  ? ? ?No orders of the defined types were placed in this encounter. ? ? ?Follow-up: Return in about 6 months (around 09/29/2021) for HLD 99213.  ? ? ?Vevelyn Francois, NP ?

## 2021-03-30 LAB — LIPID PANEL
Chol/HDL Ratio: 2.8 ratio (ref 0.0–4.4)
Cholesterol, Total: 188 mg/dL (ref 100–199)
HDL: 68 mg/dL (ref >39)
LDL Chol Calc (NIH): 103 mg/dL — ABNORMAL HIGH (ref 0–99)
Triglycerides: 92 mg/dL (ref 0–149)
VLDL Cholesterol Cal: 17 mg/dL (ref 5–40)

## 2021-03-30 LAB — COMP. METABOLIC PANEL (12)
AST: 87 IU/L — ABNORMAL HIGH (ref 0–40)
Albumin/Globulin Ratio: 2.3 — ABNORMAL HIGH (ref 1.2–2.2)
Albumin: 4.5 g/dL (ref 3.8–4.9)
Alkaline Phosphatase: 98 IU/L (ref 44–121)
BUN/Creatinine Ratio: 12 (ref 9–23)
BUN: 8 mg/dL (ref 6–24)
Bilirubin Total: 0.3 mg/dL (ref 0.0–1.2)
Calcium: 9.6 mg/dL (ref 8.7–10.2)
Chloride: 106 mmol/L (ref 96–106)
Creatinine, Ser: 0.69 mg/dL (ref 0.57–1.00)
Globulin, Total: 2 g/dL (ref 1.5–4.5)
Glucose: 95 mg/dL (ref 70–99)
Potassium: 4.3 mmol/L (ref 3.5–5.2)
Sodium: 145 mmol/L — ABNORMAL HIGH (ref 134–144)
Total Protein: 6.5 g/dL (ref 6.0–8.5)
eGFR: 102 mL/min/{1.73_m2} (ref >59)

## 2021-03-31 ENCOUNTER — Encounter: Payer: Self-pay | Admitting: Nurse Practitioner

## 2021-04-04 ENCOUNTER — Ambulatory Visit: Payer: BC Managed Care – PPO | Admitting: Obstetrics

## 2021-04-11 ENCOUNTER — Ambulatory Visit (INDEPENDENT_AMBULATORY_CARE_PROVIDER_SITE_OTHER): Payer: BC Managed Care – PPO | Admitting: Obstetrics

## 2021-04-11 ENCOUNTER — Encounter: Payer: Self-pay | Admitting: Obstetrics

## 2021-04-11 ENCOUNTER — Other Ambulatory Visit (HOSPITAL_COMMUNITY)
Admission: RE | Admit: 2021-04-11 | Discharge: 2021-04-11 | Disposition: A | Discharge status: Home / Self Care | Payer: BC Managed Care – PPO | Source: Ambulatory Visit | Attending: Obstetrics | Admitting: Obstetrics

## 2021-04-11 ENCOUNTER — Other Ambulatory Visit: Payer: Self-pay

## 2021-04-11 VITALS — Ht 64.0 in | Wt 150.2 lb

## 2021-04-11 DIAGNOSIS — Z01419 Encounter for gynecological examination (general) (routine) without abnormal findings: Secondary | ICD-10-CM | POA: Diagnosis not present

## 2021-04-11 DIAGNOSIS — N898 Other specified noninflammatory disorders of vagina: Secondary | ICD-10-CM

## 2021-04-11 DIAGNOSIS — Z113 Encounter for screening for infections with a predominantly sexual mode of transmission: Secondary | ICD-10-CM | POA: Insufficient documentation

## 2021-04-11 DIAGNOSIS — N870 Mild cervical dysplasia: Secondary | ICD-10-CM

## 2021-04-11 DIAGNOSIS — Z780 Asymptomatic menopausal state: Secondary | ICD-10-CM

## 2021-04-11 NOTE — Progress Notes (Signed)
RGYN pt in office for annual exam. She does not have any concerns today. ? ?Abnormal PAP: 3-22 ?Colpo: 6-22 ?Last mammogram:11-22 ?Last colonoscopy: 04-2014 ?

## 2021-04-11 NOTE — Progress Notes (Signed)
? ?Subjective: ? ? ?  ?  ? Kelly Grant is a 56 y.o. female here for a routine exam.  Current complaints: Vaginal discharge.   ? ?Personal health questionnaire:  ?Is [name], have a family history of breast and/or ovarian cancer: no ?Is there a family history of uterine cancer diagnosed at age < 48, gastrointestinal cancer, urinary tract cancer, family member who is a Field seismologist syndrome-associated carrier: no ?Is the patient overweight and hypertensive, family history of diabetes, personal history of gestational diabetes, preeclampsia or PCOS: no ?Is patient over 92, have PCOS,  family history of premature CHD under age 40, diabetes, smoke, have hypertension or peripheral artery disease:  no ?At any time, has a partner hit, kicked or otherwise hurt or frightened you?: no ?Over the past 2 weeks, have you felt down, depressed or hopeless?: no ?Over the past 2 weeks, have you felt little interest or pleasure in doing things?:no ? ? ?Gynecologic History ?No LMP recorded. Patient is postmenopausal. ?Contraception: post menopausal status and tubal ligation ?Last Pap:  03-31-2020. Results were: abnormal - AS-H.  07-12-2020 Colposcopy: CIN 1 ?Last mammogram: 11-2020. Results were: normal ? ?Obstetric History ?OB History  ?Gravida Para Term Preterm AB Living  ?'3 2     1 2  '$ ?SAB IAB Ectopic Multiple Live Births  ?  1     2  ?  ?# Outcome Date GA Lbr Len/2nd Weight Sex Delivery Anes PTL Lv  ?3 Para           ?2 Para           ?1 IAB           ? ? ?Past Medical History:  ?Diagnosis Date  ? Abnormal Pap smear   ? ASC-H  ? Chlamydia   ? History of chicken pox   ? HSV infection   ? Multiple sclerosis ([place])   ? Neuromuscular disorder ([place])   ? MS  ? Yeast infection   ?  ?Past Surgical History:  ?Procedure Laterality Date  ? CARPAL TUNNEL RELEASE    ? CESAREAN SECTION    ? x2  ? INCISION AND DRAINAGE ABSCESS    ? left buttock  ? TUBAL LIGATION    ?  ? ?Current Outpatient Medications:  ?  aspirin EC 81 MG tablet,  Take 1 tablet (81 mg total) by mouth daily., Disp: 30 tablet, Rfl: 11 ?  calcium-vitamin D (OSCAL WITH D) 500-200 MG-UNIT tablet, Take 2 tablets by mouth. , Disp: , Rfl:  ?  carboxymethylcellulose (REFRESH PLUS) 0.5 % SOLN, 1 drop daily as needed., Disp: , Rfl:  ?  cycloSPORINE (RESTASIS) 0.05 % ophthalmic emulsion, 1 drop 2 (two) times daily., Disp: , Rfl:  ?  Fingolimod HCl (GILENYA) 0.5 MG CAPS, Take 1 daily, Disp: 90 capsule, Rfl: 3 ?  Multiple Vitamin (MULTIVITAMIN) tablet, Take 1 tablet by mouth daily., Disp: 30 tablet, Rfl: 3 ?  omega-3 acid ethyl esters (LOVAZA) 1 g capsule, Take 1 capsule (1 g total) by mouth daily., Disp: 90 capsule, Rfl: 3 ?  rosuvastatin (CRESTOR) 10 MG tablet, TAKE 1 TABLET(10 MG) BY MOUTH AT BEDTIME, Disp: 90 tablet, Rfl: 3 ?  valACYclovir (VALTREX) 500 MG tablet, TAKE 1 TABLET(500 MG) BY MOUTH DAILY, Disp: 90 tablet, Rfl: 0 ?  Wheat Dextrin (BENEFIBER DRINK MIX PO), Take by mouth. 2 tsp TID, Disp: , Rfl:  ?  diclofenac Sodium (VOLTAREN) 1 % GEL, Apply 2 g topically 4 (four) times daily. (  Patient not taking: Reported on 03/29/2021), Disp: 150 g, Rfl: 0 ?Allergies  ?Allergen Reactions  ? Macrobid [Nitrofurantoin Macrocrystal] Hives  ?  ?Social History  ? ?Tobacco Use  ? Smoking status: Former  ?  Types: Cigarettes  ?  Quit date: 01/23/2000  ?  Years since quitting: 21.2  ? Smokeless tobacco: Not on file  ?Substance Use Topics  ? Alcohol use: Yes  ?  Comment: rarely  ?  ?Family History  ?Problem Relation Age of Onset  ? Kidney failure Mother   ? Diabetes Mother   ? Colon cancer Neg Hx   ?  ? ? ?Review of Systems ? ?Constitutional: negative for fatigue and weight loss ?Respiratory: negative for cough and wheezing ?Cardiovascular: negative for chest pain, fatigue and palpitations ?Gastrointestinal: negative for abdominal pain and change in bowel habits ?Musculoskeletal:negative for myalgias ?Neurological: negative for gait problems and tremors ?Behavioral/Psych: negative for abusive  relationship, depression ?Endocrine: negative for temperature intolerance    ?Genitourinary: positive for vaginal discharge.  negative for abnormal menstrual periods, genital lesions, hot flashes, sexual problems  ?Integument/breast: negative for breast lump, breast tenderness, nipple discharge and skin lesion(s) ? ?  ?Objective:  ? ?    ?Ht '5\' 4"'$  (1.626 m)   Wt 150 lb 3.2 oz (68.1 kg)   BMI 25.78 kg/m?  ?General:   Alert and no distress  ?Skin:   no rash or abnormalities  ?Lungs:   clear to auscultation bilaterally  ?Heart:   regular rate and rhythm, S1, S2 normal, no murmur, click, rub or gallop  ?Breasts:   normal without suspicious masses, skin or nipple changes or axillary nodes  ?Abdomen:  normal findings: no organomegaly, soft, non-tender and no hernia  ?Pelvis:  External genitalia: normal general appearance ?Urinary system: urethral meatus normal and bladder without fullness, nontender ?Vaginal: normal without tenderness, induration or masses ?Cervix: stenotic os.  No lesions, discharge, bleeding ?Adnexa: normal bimanual exam ?Uterus: anteverted and non-tender, normal size  ? ?Lab Review ?Urine pregnancy test ?Labs reviewed yes ?Radiologic studies reviewed yes ? ?I have spent a total of 20 minutes of face-to-face time, excluding clinical staff time, reviewing notes and preparing to see patient, ordering tests and/or medications, and counseling the patient.  ? ?Assessment:  ? ? 1. Encounter for gynecological examination with Papanicolaou smear of cervix ?Rx: ?- Cytology - PAP( [facility]) ? ?2. Postmenopause ?- doing well ? ?3. Vaginal discharge ?Rx: ?- Cervicovaginal ancillary only( [facility]) ? ?4. Screening for STD (sexually transmitted disease) ?Rx: ?- RPR ?- HepB+HepC+HIV Panel ? ?5. Dysplasia of cervix, low grade (CIN 1) on colposcopic biopsies ( pap smear = ASC-H ) ?  ?  ?Plan:  ? ? Education reviewed: calcium supplements, depression evaluation, low fat, low cholesterol diet, safe sex/STD  prevention, self breast exams, and weight bearing exercise. ?Follow up in: 1 year.  ? ? ?Orders Placed This Encounter  ?Procedures  ? RPR  ? HepB+HepC+HIV Panel  ? ? ? Shelly Bombard, MD ?04/11/2021 11:43 AM  ?

## 2021-04-12 LAB — CERVICOVAGINAL ANCILLARY ONLY
Bacterial Vaginitis (gardnerella): NEGATIVE
Candida Glabrata: NEGATIVE
Candida Vaginitis: NEGATIVE
Chlamydia: NEGATIVE
Comment: NEGATIVE
Comment: NEGATIVE
Comment: NEGATIVE
Comment: NEGATIVE
Comment: NEGATIVE
Comment: NORMAL
Neisseria Gonorrhea: NEGATIVE
Trichomonas: NEGATIVE

## 2021-04-13 LAB — CYTOLOGY - PAP: Diagnosis: NEGATIVE

## 2021-05-22 ENCOUNTER — Telehealth: Payer: Self-pay | Admitting: Nurse Practitioner

## 2021-05-22 NOTE — Telephone Encounter (Signed)
[facility] [address] requesting refill on Crestor '10mg'$  for patient ?

## 2021-05-23 ENCOUNTER — Other Ambulatory Visit: Payer: Self-pay | Admitting: Nurse Practitioner

## 2021-05-23 DIAGNOSIS — E785 Hyperlipidemia, unspecified: Secondary | ICD-10-CM

## 2021-05-23 MED ORDER — ROSUVASTATIN CALCIUM 10 MG PO TABS: ORAL_TABLET | ORAL | 3 refills | Status: DC

## 2021-05-26 ENCOUNTER — Other Ambulatory Visit: Payer: Self-pay | Admitting: Nurse Practitioner

## 2021-05-26 ENCOUNTER — Telehealth: Payer: Self-pay

## 2021-05-26 DIAGNOSIS — B009 Herpesviral infection, unspecified: Secondary | ICD-10-CM

## 2021-05-26 MED ORDER — VALACYCLOVIR HCL 500 MG PO TABS: ORAL_TABLET | ORAL | 0 refills | Status: DC

## 2021-05-26 NOTE — Telephone Encounter (Signed)
Valacyclovir 

## 2021-05-29 ENCOUNTER — Encounter: Payer: Self-pay | Admitting: Gastroenterology

## 2021-06-07 ENCOUNTER — Ambulatory Visit (AMBULATORY_SURGERY_CENTER): Payer: BC Managed Care – PPO

## 2021-06-07 VITALS — Ht 64.0 in | Wt 150.0 lb

## 2021-06-07 DIAGNOSIS — Z860100 Personal history of colon polyps, unspecified: Secondary | ICD-10-CM

## 2021-06-07 MED ORDER — NA SULFATE-K SULFATE-MG SULF 17.5-3.13-1.6 GM/177ML PO SOLN: 1.0000 | Freq: Once | ORAL | 0 refills | Status: AC

## 2021-06-07 NOTE — Progress Notes (Signed)
No egg or soy allergy known to patient  ?No issues known to pt with past sedation with any surgeries or procedures ?Patient denies ever being told they had issues or difficulty with intubation  ?No FH of Malignant Hyperthermia ?Pt is not on diet pills ?Pt is not on home 02  ?Pt is not on blood thinners  ?Pt denies issues with constipation at this time- ?No A fib or A flutter ?NO PA's for preps discussed with pt in PV today  ?Discussed with pt there will be an out-of-pocket cost for prep and that varies from $0 to 70 + dollars - pt verbalized understanding  ?Pt instructed to use Singlecare.com or GoodRx for a price reduction on prep  ?PV completed over the phone. Pt verified name, DOB, address and insurance during PV today.  ?Pt given instruction packet to read and not return.  ?Pt encouraged to call with questions or issues.  ?Pt has My chart, procedure instructions sent via My Chart  ?Insurance confirmed with pt at [facility] today  ? ?

## 2021-07-27 ENCOUNTER — Other Ambulatory Visit: Payer: Self-pay | Admitting: Neurology

## 2021-07-28 ENCOUNTER — Encounter: Payer: Self-pay | Admitting: Gastroenterology

## 2021-07-28 ENCOUNTER — Ambulatory Visit (AMBULATORY_SURGERY_CENTER): Payer: BC Managed Care – PPO | Admitting: Gastroenterology

## 2021-07-28 VITALS — BP 108/61 | HR 58 | Temp 97.5°F | Resp 19 | Ht 64.0 in | Wt 150.0 lb

## 2021-07-28 DIAGNOSIS — Z09 Encounter for follow-up examination after completed treatment for conditions other than malignant neoplasm: Secondary | ICD-10-CM | POA: Diagnosis not present

## 2021-07-28 DIAGNOSIS — D122 Benign neoplasm of ascending colon: Secondary | ICD-10-CM | POA: Diagnosis not present

## 2021-07-28 DIAGNOSIS — D123 Benign neoplasm of transverse colon: Secondary | ICD-10-CM

## 2021-07-28 DIAGNOSIS — D124 Benign neoplasm of descending colon: Secondary | ICD-10-CM

## 2021-07-28 DIAGNOSIS — Z860100 Personal history of colon polyps, unspecified: Secondary | ICD-10-CM

## 2021-07-28 DIAGNOSIS — Z1211 Encounter for screening for malignant neoplasm of colon: Secondary | ICD-10-CM | POA: Diagnosis not present

## 2021-07-28 MED ORDER — SODIUM CHLORIDE 0.9 % IV SOLN: 500.0000 mL | Freq: Once | INTRAVENOUS | Status: DC

## 2021-07-28 NOTE — Progress Notes (Signed)
HPI: This is a woman with h/o polyps Colonoscopy 2016 Dr. Ardis Hughs found 3 subCM adenomas  ROS: complete GI ROS as described in HPI, all other review negative.  Constitutional:  No unintentional weight loss   Past Medical History:  Diagnosis Date   Abnormal Pap smear    ASC-H   Chlamydia    History of chicken pox    HSV infection    Multiple sclerosis ([place])    Neuromuscular disorder ([place])    MS   Yeast infection     Past Surgical History:  Procedure Laterality Date   CARPAL TUNNEL RELEASE Left    in addition to wrist fracture surgery   CESAREAN SECTION     x2   COLONOSCOPY  2016   [id number](exc)-TA   INCISION AND DRAINAGE ABSCESS Left    left buttock   POLYPECTOMY  2016   TA   TUBAL LIGATION      Current Outpatient Medications  Medication Sig Dispense Refill   Wheat Dextrin (BENEFIBER DRINK MIX PO) Take by mouth. 2 tsp TID     aspirin EC 81 MG tablet Take 1 tablet (81 mg total) by mouth daily. 30 tablet 11   Boswellia-Glucosamine-Vit D (OSTEO BI-FLEX ONE PER DAY PO) Take 2 tablets by mouth daily at 6 (six) AM.     calcium-vitamin D (OSCAL WITH D) 500-200 MG-UNIT tablet Take 2 tablets by mouth.      carboxymethylcellulose (REFRESH PLUS) 0.5 % SOLN 1 drop daily as needed.     Fingolimod HCl 0.5 MG CAPS TAKE ONE CAPSULE BY MOUTH ONCE DAILY. MAY TAKE WITH OR WITHOUT FOOD. STORE AT ROOM TEMPERATURE. 90 capsule 0   Multiple Vitamin (MULTIVITAMIN) tablet Take 1 tablet by mouth daily. 30 tablet 3   omega-3 acid ethyl esters (LOVAZA) 1 g capsule Take 1 capsule (1 g total) by mouth daily. 90 capsule 3   Probiotic Product (PROBIOTIC PO) Take 1 capsule by mouth daily at 6 (six) AM.     rosuvastatin (CRESTOR) 10 MG tablet TAKE 1 TABLET(10 MG) BY MOUTH AT BEDTIME 90 tablet 3   valACYclovir (VALTREX) 500 MG tablet TAKE 1 TABLET(500 MG) BY MOUTH DAILY 90 tablet 0   Current Facility-Administered Medications  Medication Dose Route Frequency Provider Last Rate Last Admin   0.9 %   sodium chloride infusion  500 mL Intravenous Once Milus Banister, MD        Allergies as of 07/28/2021 - Review Complete 07/28/2021  Allergen Reaction Noted   Macrobid [nitrofurantoin macrocrystal] Hives 06/22/2011    Family History  Problem Relation Age of Onset   Colon polyps Mother    Kidney failure Mother    Diabetes Mother    Colon cancer Neg Hx    Esophageal cancer Neg Hx    Rectal cancer Neg Hx    Stomach cancer Neg Hx     Social History   Socioeconomic History   Marital status: Single    Spouse name: Not on file   Number of children: 2   Years of education: College    Highest education level: Not on file  Occupational History   Occupation: HOUSEKEEPING    Employer: [employer]  Tobacco Use   Smoking status: Former    Types: Cigarettes    Quit date: 01/23/2000    Years since quitting: 21.5   Smokeless tobacco: Not on file  Vaping Use   Vaping Use: Never used  Substance and Sexual Activity   Alcohol use: Yes  Comment: rarely   Drug use: No   Sexual activity: Not Currently    Partners: Male    Birth control/protection: Surgical    Comment: tubal ligation per pt  Other Topics Concern   Not on file  Social History Narrative   Patient lives at home with children.    Patient has 2 children.    Patient has a college degree.    Patient is single.    Patient is right handed.    Patient is currently employed.    Social Determinants of Health   Financial Resource Strain: Not on file  Food Insecurity: Not on file  Transportation Needs: Not on file  Physical Activity: Not on file  Stress: Not on file  Social Connections: Not on file  Intimate Partner Violence: Not on file     Physical Exam: BP 122/66   Pulse 74   Temp (!) 97.5 F (36.4 C)   Ht _0  (1.626 m)   Wt 150 lb (68 kg)   SpO2 99%   BMI 25.75 kg/m  Constitutional: generally well-appearing Psychiatric: alert and oriented x3 Lungs: CTA bilaterally Heart: no MCR  Assessment and  plan: 56 y.o. female with h/o polyps  Surveillance colonoscpoy today  Care is appropriate for the ambulatory setting.  Owens Loffler, MD [facility] 07/28/2021, 8:11 AM

## 2021-07-28 NOTE — Progress Notes (Signed)
A and O x3. Report to RN. Tolerated MAC anesthesia well. 

## 2021-07-28 NOTE — Progress Notes (Signed)
Called to room to assist during endoscopic procedure.  Patient ID and intended procedure confirmed with present staff. Received instructions for my participation in the procedure from the performing physician.  

## 2021-07-28 NOTE — Op Note (Signed)
[facility] Patient Name: Kelly Grant Procedure Date: 07/28/2021 8:13 AM MRN: [id number] Endoscopist: Milus Banister , MD Age: 56 Referring MD:  Date of Birth: 09/21/1965 Gender: Female Account #: [id number] Procedure:                Colonoscopy Indications:              High risk colon cancer surveillance: Personal                            history of colonic polyps Medicines:                Monitored Anesthesia Care Procedure:                Pre-Anesthesia Assessment:                           - Prior to the procedure, a History and Physical                            was performed, and patient medications and                            allergies were reviewed. The patient's tolerance of                            previous anesthesia was also reviewed. The risks                            and benefits of the procedure and the sedation                            options and risks were discussed with the patient.                            All questions were answered, and informed consent                            was obtained. Prior Anticoagulants: The patient has                            taken no previous anticoagulant or antiplatelet                            agents. ASA Grade Assessment: II - A patient with                            mild systemic disease. After reviewing the risks                            and benefits, the patient was deemed in                            satisfactory condition to undergo the procedure.  After obtaining informed consent, the colonoscope                            was passed under direct vision. Throughout the                            procedure, the patient's blood pressure, pulse, and                            oxygen saturations were monitored continuously. The                            Olympus PCF-[id number] (#[id number]) Colonoscope was                            introduced through the anus and advanced to  the the                            cecum, identified by appendiceal orifice and                            ileocecal valve. The colonoscopy was performed                            without difficulty. The patient tolerated the                            procedure well. The quality of the bowel                            preparation was good. The ileocecal valve,                            appendiceal orifice, and rectum were photographed. Scope In: 8:18:22 AM Scope Out: 8:33:15 AM Scope Withdrawal Time: 0 hours 9 minutes 23 seconds  Total Procedure Duration: 0 hours 14 minutes 53 seconds  Findings:                 Four sessile polyps were found in the descending                            colon, transverse colon and ascending colon. The                            polyps were 3 to 4 mm in size. These polyps were                            removed with a cold snare. Resection and retrieval                            were complete.                           A few small diverticulum throughout the colon.  The exam was otherwise without abnormality on                            direct and retroflexion views. Complications:            No immediate complications. Estimated blood loss:                            None. Estimated Blood Loss:     Estimated blood loss: none. Impression:               - Four 3 to 4 mm polyps in the descending colon, in                            the transverse colon and in the ascending colon,                            removed with a cold snare. Resected and retrieved.                           - Diverticulosis.                           - The examination was otherwise normal on direct                            and retroflexion views. Recommendation:           - Patient has a contact number available for                            emergencies. The signs and symptoms of potential                            delayed complications were  discussed with the                            patient. Return to normal activities tomorrow.                            Written discharge instructions were provided to the                            patient.                           - Resume previous diet.                           - Continue present medications.                           - Await pathology results. Milus Banister, MD 07/28/2021 8:36:08 AM This report has been signed electronically.

## 2021-07-28 NOTE — Patient Instructions (Signed)
Handouts given for diverticulosis and polyps.  Await pathology results.  Resume previous diet.  Continue present medications.  YOU HAD AN ENDOSCOPIC PROCEDURE TODAY AT [facility]:   Refer to the procedure report that was given to you for any specific questions about what was found during the examination.  If the procedure report does not answer your questions, please call your gastroenterologist to clarify.  If you requested that your care partner not be given the details of your procedure findings, then the procedure report has been included in a sealed envelope for you to review at your convenience later.  YOU SHOULD EXPECT: Some feelings of bloating in the abdomen. Passage of more gas than usual.  Walking can help get rid of the air that was put into your GI tract during the procedure and reduce the bloating. If you had a lower endoscopy (such as a colonoscopy or flexible sigmoidoscopy) you may notice spotting of blood in your stool or on the toilet paper. If you underwent a bowel prep for your procedure, you may not have a normal bowel movement for a few days.  Please Note:  You might notice some irritation and congestion in your nose or some drainage.  This is from the oxygen used during your procedure.  There is no need for concern and it should clear up in a day or so.  SYMPTOMS TO REPORT IMMEDIATELY:  Following lower endoscopy (colonoscopy or flexible sigmoidoscopy):  Excessive amounts of blood in the stool  Significant tenderness or worsening of abdominal pains  Swelling of the abdomen that is new, acute  Fever of 100F or higher   For urgent or emergent issues, a gastroenterologist can be reached at any hour by calling [phone]. Do not use MyChart messaging for urgent concerns.    DIET:  We do recommend a small meal at first, but then you may proceed to your regular diet.  Drink plenty of fluids but you should avoid alcoholic beverages for 24 hours.  ACTIVITY:   You should plan to take it easy for the rest of today and you should NOT DRIVE or use heavy machinery until tomorrow (because of the sedation medicines used during the test).    FOLLOW UP: Our staff will call the number listed on your records the next business day following your procedure.  We will call around 7:15- 8:00 am to check on you and address any questions or concerns that you may have regarding the information given to you following your procedure. If we do not reach you, we will leave a message.  If you develop any symptoms (ie: fever, flu-like symptoms, shortness of breath, cough etc.) before then, please call [phone].  If you test positive for Covid 19 in the 2 weeks post procedure, please call and report this information to [place].    If any biopsies were taken you will be contacted by phone or by letter within the next 1-3 weeks.  Please call us at [phone] if you have not heard about the biopsies in 3 weeks.    SIGNATURES/CONFIDENTIALITY: You and/or your care partner have signed paperwork which will be entered into your electronic medical record.  These signatures attest to the fact that that the information above on your After Visit Summary has been reviewed and is understood.  Full responsibility of the confidentiality of this discharge information lies with you and/or your care-partner.

## 2021-07-28 NOTE — Progress Notes (Signed)
Pt's states no medical or surgical changes since previsit or office visit. 

## 2021-07-31 ENCOUNTER — Telehealth: Payer: Self-pay

## 2021-07-31 DIAGNOSIS — H04123 Dry eye syndrome of bilateral lacrimal glands: Secondary | ICD-10-CM | POA: Diagnosis not present

## 2021-07-31 DIAGNOSIS — G35 Multiple sclerosis: Secondary | ICD-10-CM | POA: Diagnosis not present

## 2021-07-31 DIAGNOSIS — H2513 Age-related nuclear cataract, bilateral: Secondary | ICD-10-CM | POA: Diagnosis not present

## 2021-07-31 DIAGNOSIS — H5213 Myopia, bilateral: Secondary | ICD-10-CM | POA: Diagnosis not present

## 2021-07-31 NOTE — Telephone Encounter (Signed)
  Follow up Call-     07/28/2021    7:46 AM  Call back number  Post procedure Call Back phone  # [phone]  Permission to leave phone message Yes     Patient questions:  Do you have a fever, pain , or abdominal swelling? No. Pain Score  0 *  Have you tolerated food without any problems? Yes.    Have you been able to return to your normal activities? Yes.    Do you have any questions about your discharge instructions: Diet   No. Medications  No. Follow up visit  No.  Do you have questions or concerns about your Care? No.  Actions: * If pain score is 4 or above: No action needed, pain <4.

## 2021-08-02 ENCOUNTER — Encounter: Payer: Self-pay | Admitting: Gastroenterology

## 2021-08-10 ENCOUNTER — Ambulatory Visit: Payer: BC Managed Care – PPO | Admitting: Neurology

## 2021-08-10 ENCOUNTER — Encounter: Payer: Self-pay | Admitting: Neurology

## 2021-08-10 NOTE — Progress Notes (Deleted)
PATIENT: Kelly Grant DOB: 10-04-1965  REASON FOR VISIT: follow up MS HISTORY FROM: patient Primary Neurologist: Dr. Krista Blue   HISTORY OF PRESENT ILLNESS: Today 08/10/21   Update 02/01/2021 SS: Kelly Grant here today for follow-up with history of RRMS on Gilenya.  Continues to do well.  Has no new MS symptoms.  Continues to work full-time, her job is physical, may have weakness of left arm with overuse.  No falls.  Her health has been good.  Here today unaccompanied.  Update 07/19/2020 SS: Kelly Grant is a 56 year old female with history of RRMS on Gilenya.  MRI of the brain in July 2021 was overall stable compared to previous in June 2018.  There were no new lesions.  Remains overall stable.  With overuse, will note weakness to the left arm.  She works for [employer], is physical job. Works 6 PM-3 AM. 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 No falls.  Has annual evaluation with Dr. Katy Fitch.  Denies any new problems or concerns.  Feels MS is overall stable.  REVIEW OF SYSTEMS: Out of a complete 14 system review of symptoms, the patient complains only of the following symptoms, and all other reviewed systems are negative.  N/A  ALLERGIES: Allergies  Allergen Reactions   Macrobid [Nitrofurantoin Macrocrystal] Hives    HOME MEDICATIONS: Outpatient Medications Prior to Visit  Medication Sig Dispense Refill   aspirin EC 81 MG tablet Take 1 tablet (81 mg total) by mouth daily. 30 tablet 11   Boswellia-Glucosamine-Vit D (OSTEO BI-FLEX ONE PER DAY PO) Take 2 tablets by mouth daily at 6 (six) AM.     calcium-vitamin D (OSCAL WITH D) 500-200 MG-UNIT tablet Take 2 tablets by mouth.      carboxymethylcellulose (REFRESH PLUS) 0.5 % SOLN 1 drop daily as needed.     Fingolimod HCl 0.5 MG CAPS TAKE ONE CAPSULE BY MOUTH ONCE DAILY. MAY TAKE WITH OR WITHOUT FOOD. STORE AT ROOM TEMPERATURE. 90 capsule 0   Multiple Vitamin (MULTIVITAMIN) tablet Take 1 tablet by mouth daily. 30 tablet 3    omega-3 acid ethyl esters (LOVAZA) 1 g capsule Take 1 capsule (1 g total) by mouth daily. 90 capsule 3   Probiotic Product (PROBIOTIC PO) Take 1 capsule by mouth daily at 6 (six) AM.     rosuvastatin (CRESTOR) 10 MG tablet TAKE 1 TABLET(10 MG) BY MOUTH AT BEDTIME 90 tablet 3   valACYclovir (VALTREX) 500 MG tablet TAKE 1 TABLET(500 MG) BY MOUTH DAILY 90 tablet 0   Wheat Dextrin (BENEFIBER DRINK MIX PO) Take by mouth. 2 tsp TID     No facility-administered medications prior to visit.    PAST MEDICAL HISTORY: Past Medical History:  Diagnosis Date   Abnormal Pap smear    ASC-H   Chlamydia    History of chicken pox    HSV infection    Multiple sclerosis ([place])    Neuromuscular disorder ([place])    MS   Yeast infection     PAST SURGICAL HISTORY: Past Surgical History:  Procedure Laterality Date   CARPAL TUNNEL RELEASE Left    in addition to wrist fracture surgery   CESAREAN SECTION     x2   COLONOSCOPY  2016   DJ-MAC-movi(exc)-TA   INCISION AND DRAINAGE ABSCESS Left    left buttock   POLYPECTOMY  2016   TA   TUBAL LIGATION      FAMILY HISTORY: Family History  Problem Relation Age of Onset   Colon  polyps Mother    Kidney failure Mother    Diabetes Mother    Colon cancer Neg Hx    Esophageal cancer Neg Hx    Rectal cancer Neg Hx    Stomach cancer Neg Hx     SOCIAL HISTORY: Social History   Socioeconomic History   Marital status: Single    Spouse name: Not on file   Number of children: 2   Years of education: College    Highest education level: Not on file  Occupational History   Occupation: HOUSEKEEPING    Employer: [employer]  Tobacco Use   Smoking status: Former    Types: Cigarettes    Quit date: 01/23/2000    Years since quitting: 21.5   Smokeless tobacco: Not on file  Vaping Use   Vaping Use: Never used  Substance and Sexual Activity   Alcohol use: Yes    Comment: rarely   Drug use: No   Sexual activity: Not Currently    Partners: Male    Birth  control/protection: Surgical    Comment: tubal ligation per pt  Other Topics Concern   Not on file  Social History Narrative   Patient lives at home with children.    Patient has 2 children.    Patient has a college degree.    Patient is single.    Patient is right handed.    Patient is currently employed.    Social Determinants of Health   Financial Resource Strain: Not on file  Food Insecurity: Not on file  Transportation Needs: Not on file  Physical Activity: Not on file  Stress: Not on file  Social Connections: Not on file  Intimate Partner Violence: Not on file   PHYSICAL EXAM  There were no vitals filed for this visit.  There is no height or weight on file to calculate BMI.  Generalized: Well developed, in no acute distress   Neurological examination  Mentation: Alert oriented to time, place, history taking. Follows all commands speech and language fluent Cranial nerve II-XII: Pupils were equal round reactive to light. Extraocular movements were full, visual field were full on confrontational test. Facial sensation and strength were normal. Head turning and shoulder shrug were normal and symmetric. Motor: 5/5 strength all extremities, but 4/5 left upper extremity Sensory: Sensory testing is intact to soft touch on all 4 extremities. No evidence of extinction is noted.  Coordination: Cerebellar testing reveals good finger-nose-finger and heel-to-shin bilaterally.  Gait and station: Gait is normal. Tandem gait is normal.  Reflexes: Deep tendon reflexes are symmetric and normal bilaterally.   DIAGNOSTIC DATA (LABS, IMAGING, TESTING) - I reviewed patient records, labs, notes, testing and imaging myself where available.  Lab Results  Component Value Date   WBC 3.9 02/01/2021   HGB 13.2 02/01/2021   HCT 39.6 02/01/2021   MCV 98 (H) 02/01/2021   PLT 282 02/01/2021      Component Value Date/Time   NA 145 (H) 03/29/2021 1607   K 4.3 03/29/2021 1607   CL 106  03/29/2021 1607   CO2 23 02/01/2021 1142   GLUCOSE 95 03/29/2021 1607   GLUCOSE 80 02/17/2015 1353   BUN 8 03/29/2021 1607   CREATININE 0.69 03/29/2021 1607   CREATININE 0.77 02/17/2015 1353   CALCIUM 9.6 03/29/2021 1607   PROT 6.5 03/29/2021 1607   ALBUMIN 4.5 03/29/2021 1607   AST 87 (H) 03/29/2021 1607   ALT 34 (H) 02/01/2021 1142   ALKPHOS 98 03/29/2021 1607  BILITOT 0.3 03/29/2021 1607   GFRNONAA 93 01/19/2020 1039   GFRNONAA >89 02/17/2015 1353   GFRAA 107 01/19/2020 1039   GFRAA >89 02/17/2015 1353   Lab Results  Component Value Date   CHOL 188 03/29/2021   HDL 68 03/29/2021   LDLCALC 103 (H) 03/29/2021   TRIG 92 03/29/2021   CHOLHDL 2.8 03/29/2021   Lab Results  Component Value Date   HGBA1C 5.2 02/17/2016   Lab Results  Component Value Date   VITAMINB12 682 02/18/2019   Lab Results  Component Value Date   TSH 0.927 12/19/2016    ASSESSMENT AND PLAN 56 y.o. year old female  has a past medical history of Abnormal Pap smear, Chlamydia, History of chicken pox, HSV infection, Multiple sclerosis ([place]), Neuromuscular disorder ([place]), and Yeast infection. here with:  1.  Relapsing remitting multiple sclerosis  -Shardai continues to do well, MS is stable -Continue Gilenya, check routine labs today -MRI of the brain in July 2021 was overall stable, consider repeat MRI of the brain at next visit -Follow-up in 6 months or sooner if needed  Butler Denmark, Laqueta Jean, DNP 08/10/2021, 5:58 AM [facility] [address] [place], [place] [address] [phone]

## 2021-08-16 NOTE — Progress Notes (Unsigned)
PATIENT: Kelly Grant DOB: December 28, 1965  REASON FOR VISIT: follow up MS HISTORY FROM: patient Primary Neurologist: Dr. Krista Blue   HISTORY OF PRESENT ILLNESS: Today 08/17/21 Kelly Grant is here today for follow-up.  MRI of the brain in July 2021 showed multiple chronic plaques consistent with MS, the MRI was stable from previous in 2018.  She remains on Gilenya, is on generic. MS is stable. Denies any changes to vision, saw eye doctor, no problems mentioned, does wear glasses. No numbness or weakness noted. Has urinary urgency. No falls. No changes to balance. Continues to work full time.   Update 02/01/2021 SS: Kelly Grant here today for follow-up with history of RRMS on Gilenya.  Continues to do well.  Has no new MS symptoms.  Continues to work full-time, her job is physical, may have weakness of left arm with overuse.  No falls.  Her health has been good.  Here today unaccompanied.  Update 07/19/2020 SS: Kelly Grant is a 56 year old female with history of RRMS on Gilenya.  MRI of the brain in July 2021 was overall stable compared to previous in June 2018.  There were no new lesions.  Remains overall stable.  With overuse, will note weakness to the left arm.  She works for [employer], is physical job. Works 6 PM-3 AM. 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 No falls.  Has annual evaluation with Dr. Katy Fitch.  Denies any new problems or concerns.  Feels MS is overall stable.  REVIEW OF SYSTEMS: Out of a complete 14 system review of symptoms, the patient complains only of the following symptoms, and all other reviewed systems are negative.  See HPI  ALLERGIES: Allergies  Allergen Reactions   Macrobid [Nitrofurantoin Macrocrystal] Hives    HOME MEDICATIONS: Outpatient Medications Prior to Visit  Medication Sig Dispense Refill   aspirin EC 81 MG tablet Take 1 tablet (81 mg total) by mouth daily. 30 tablet 11   Boswellia-Glucosamine-Vit D (OSTEO BI-FLEX ONE PER DAY PO) Take 2  tablets by mouth daily at 6 (six) AM.     calcium-vitamin D (OSCAL WITH D) 500-200 MG-UNIT tablet Take 2 tablets by mouth.      carboxymethylcellulose (REFRESH PLUS) 0.5 % SOLN 1 drop daily as needed.     Fingolimod HCl 0.5 MG CAPS TAKE ONE CAPSULE BY MOUTH ONCE DAILY. MAY TAKE WITH OR WITHOUT FOOD. STORE AT ROOM TEMPERATURE. 90 capsule 0   Multiple Vitamin (MULTIVITAMIN) tablet Take 1 tablet by mouth daily. 30 tablet 3   omega-3 acid ethyl esters (LOVAZA) 1 g capsule Take 1 capsule (1 g total) by mouth daily. 90 capsule 3   Probiotic Product (PROBIOTIC PO) Take 1 capsule by mouth daily at 6 (six) AM.     rosuvastatin (CRESTOR) 10 MG tablet TAKE 1 TABLET(10 MG) BY MOUTH AT BEDTIME 90 tablet 3   valACYclovir (VALTREX) 500 MG tablet TAKE 1 TABLET(500 MG) BY MOUTH DAILY 90 tablet 0   Wheat Dextrin (BENEFIBER DRINK MIX PO) Take by mouth. 2 tsp TID     No facility-administered medications prior to visit.    PAST MEDICAL HISTORY: Past Medical History:  Diagnosis Date   Abnormal Pap smear    ASC-H   Chlamydia    History of chicken pox    HSV infection    Multiple sclerosis ([place])    Neuromuscular disorder ([place])    MS   Yeast infection     PAST SURGICAL HISTORY: Past Surgical History:  Procedure Laterality Date  CARPAL TUNNEL RELEASE Left    in addition to wrist fracture surgery   CESAREAN SECTION     x2   COLONOSCOPY  2016   DJ-MAC-movi(exc)-TA   INCISION AND DRAINAGE ABSCESS Left    left buttock   POLYPECTOMY  2016   TA   TUBAL LIGATION      FAMILY HISTORY: Family History  Problem Relation Age of Onset   Colon polyps Mother    Kidney failure Mother    Diabetes Mother    Colon cancer Neg Hx    Esophageal cancer Neg Hx    Rectal cancer Neg Hx    Stomach cancer Neg Hx     SOCIAL HISTORY: Social History   Socioeconomic History   Marital status: Single    Spouse name: Not on file   Number of children: 2   Years of education: College    Highest education level: Not  on file  Occupational History   Occupation: HOUSEKEEPING    Employer: [employer]  Tobacco Use   Smoking status: Former    Types: Cigarettes    Quit date: 01/23/2000    Years since quitting: 21.5   Smokeless tobacco: Not on file  Vaping Use   Vaping Use: Never used  Substance and Sexual Activity   Alcohol use: Yes    Comment: rarely   Drug use: No   Sexual activity: Not Currently    Partners: Male    Birth control/protection: Surgical    Comment: tubal ligation per pt  Other Topics Concern   Not on file  Social History Narrative   Patient lives at home with children.    Patient has 2 children.    Patient has a college degree.    Patient is single.    Patient is right handed.    Patient is currently employed.    Social Determinants of Health   Financial Resource Strain: Not on file  Food Insecurity: Not on file  Transportation Needs: Not on file  Physical Activity: Not on file  Stress: Not on file  Social Connections: Not on file  Intimate Partner Violence: Not on file   PHYSICAL EXAM  Vitals:   08/17/21 0951  BP: (!) 145/86  Pulse: 70  Weight: 145 lb 8 oz (66 kg)  Height: _0  (1.626 m)    Body mass index is 24.98 kg/m.  Generalized: Well developed, in no acute distress   Neurological examination  Mentation: Alert oriented to time, place, history taking. Follows all commands speech and language fluent Cranial nerve II-XII: Pupils were equal round reactive to light. Extraocular movements were full, visual field were full on confrontational test. Facial sensation and strength were normal. Head turning and shoulder shrug were normal and symmetric. Motor: 5/5 strength all extremities Sensory: Sensory testing is intact to soft touch on all 4 extremities. No evidence of extinction is noted.  Coordination: Cerebellar testing reveals good finger-nose-finger and heel-to-shin bilaterally.  Gait and station: Gait is normal. Tandem gait is normal.  Reflexes: Deep  tendon reflexes are symmetric and normal bilaterally.   DIAGNOSTIC DATA (LABS, IMAGING, TESTING) - I reviewed patient records, labs, notes, testing and imaging myself where available.  Lab Results  Component Value Date   WBC 3.9 02/01/2021   HGB 13.2 02/01/2021   HCT 39.6 02/01/2021   MCV 98 (H) 02/01/2021   PLT 282 02/01/2021      Component Value Date/Time   NA 145 (H) 03/29/2021 1607   K 4.3 03/29/2021 1607  CL 106 03/29/2021 1607   CO2 23 02/01/2021 1142   GLUCOSE 95 03/29/2021 1607   GLUCOSE 80 02/17/2015 1353   BUN 8 03/29/2021 1607   CREATININE 0.69 03/29/2021 1607   CREATININE 0.77 02/17/2015 1353   CALCIUM 9.6 03/29/2021 1607   PROT 6.5 03/29/2021 1607   ALBUMIN 4.5 03/29/2021 1607   AST 87 (H) 03/29/2021 1607   ALT 34 (H) 02/01/2021 1142   ALKPHOS 98 03/29/2021 1607   BILITOT 0.3 03/29/2021 1607   GFRNONAA 93 01/19/2020 1039   GFRNONAA >89 02/17/2015 1353   GFRAA 107 01/19/2020 1039   GFRAA >89 02/17/2015 1353   Lab Results  Component Value Date   CHOL 188 03/29/2021   HDL 68 03/29/2021   LDLCALC 103 (H) 03/29/2021   TRIG 92 03/29/2021   CHOLHDL 2.8 03/29/2021   Lab Results  Component Value Date   HGBA1C 5.2 02/17/2016   Lab Results  Component Value Date   VITAMINB12 682 02/18/2019   Lab Results  Component Value Date   TSH 0.927 12/19/2016    ASSESSMENT AND PLAN 56 y.o. year old female  has a past medical history of Abnormal Pap smear, Chlamydia, History of chicken pox, HSV infection, Multiple sclerosis ([place]), Neuromuscular disorder ([place]), and Yeast infection. here with:  1.  Relapsing remitting multiple sclerosis  -MS remains stable, continue Gilenya -Check MRI of the brain with and without contrast, routine labs to ensure MS stability and no adverse effects from Gilenya -Follow-up in 6 months or sooner if needed  Butler Denmark, AGNP-C, DNP 08/17/2021, 10:07 AM [facility] [address], [place], [place]  [address] [phone]

## 2021-08-17 ENCOUNTER — Encounter: Payer: Self-pay | Admitting: Neurology

## 2021-08-17 ENCOUNTER — Telehealth: Payer: Self-pay | Admitting: Neurology

## 2021-08-17 ENCOUNTER — Ambulatory Visit: Payer: BC Managed Care – PPO | Admitting: Neurology

## 2021-08-17 VITALS — BP 145/86 | HR 70 | Ht 64.0 in | Wt 145.5 lb

## 2021-08-17 DIAGNOSIS — G35 Multiple sclerosis: Secondary | ICD-10-CM | POA: Diagnosis not present

## 2021-08-17 MED ORDER — FINGOLIMOD HCL 0.5 MG PO CAPS
ORAL_CAPSULE | ORAL | 3 refills | Status: DC
Start: 2021-08-17 — End: 2022-03-14

## 2021-08-17 NOTE — Telephone Encounter (Signed)
I contacted [facility] and spoke with Tayna. She confirmed PA is needed.   I have submitted expedited PA through [facility].   (Key: [id number])  Your information has been submitted to [place]. [employer] [place] will review the request and notify you of the determination decision directly, typically within 72 hours of receiving all information.  You will also receive your request decision electronically. To check for an update later, open this request again from your dashboard.  If [facility] [employer] [facility] has not responded within the specified timeframe or if you have any questions about your PA submission, contact [facility] directly at [phone].  Also have attached additional office notes through Fax # [phone] Confirmation received.

## 2021-08-17 NOTE — Patient Instructions (Signed)
Check labs, MRI brain  Continue Gilenya  Meds ordered this encounter  Medications   Fingolimod HCl 0.5 MG CAPS    Sig: TAKE ONE CAPSULE BY MOUTH ONCE DAILY. MAY TAKE WITH OR WITHOUT FOOD. STORE AT ROOM TEMPERATURE.    Dispense:  90 capsule    Refill:  3

## 2021-08-17 NOTE — Telephone Encounter (Signed)
Patient seen here today for follow-up on generic Gilenya. Has 8 pills left. Has a text from [facility] stated it needs insurance approval, would reach out to your doctor and insurance. Can we watch for any paperwork to come through so there is no lapse in medication?

## 2021-08-18 LAB — COMPREHENSIVE METABOLIC PANEL
ALT: 29 IU/L (ref 0–32)
AST: 32 IU/L (ref 0–40)
Albumin/Globulin Ratio: 2.5 — ABNORMAL HIGH (ref 1.2–2.2)
Albumin: 4.5 g/dL (ref 3.8–4.9)
Alkaline Phosphatase: 88 IU/L (ref 44–121)
BUN/Creatinine Ratio: 14 (ref 9–23)
BUN: 11 mg/dL (ref 6–24)
Bilirubin Total: 0.3 mg/dL (ref 0.0–1.2)
CO2: 22 mmol/L (ref 20–29)
Calcium: 9.6 mg/dL (ref 8.7–10.2)
Chloride: 105 mmol/L (ref 96–106)
Creatinine, Ser: 0.76 mg/dL (ref 0.57–1.00)
Globulin, Total: 1.8 g/dL (ref 1.5–4.5)
Glucose: 89 mg/dL (ref 70–99)
Potassium: 4 mmol/L (ref 3.5–5.2)
Sodium: 142 mmol/L (ref 134–144)
Total Protein: 6.3 g/dL (ref 6.0–8.5)
eGFR: 91 mL/min/{1.73_m2} (ref >59)

## 2021-08-18 LAB — CBC WITH DIFFERENTIAL/PLATELET
Basophils Absolute: 0 10*3/uL (ref 0.0–0.2)
Basos: 0 %
EOS (ABSOLUTE): 0 10*3/uL (ref 0.0–0.4)
Eos: 1 %
Hematocrit: 38.9 % (ref 34.0–46.6)
Hemoglobin: 13.2 g/dL (ref 11.1–15.9)
Immature Grans (Abs): 0 10*3/uL (ref 0.0–0.1)
Immature Granulocytes: 0 %
Lymphocytes Absolute: 0.9 10*3/uL (ref 0.7–3.1)
Lymphs: 26 %
MCH: 33.2 pg — ABNORMAL HIGH (ref 26.6–33.0)
MCHC: 33.9 g/dL (ref 31.5–35.7)
MCV: 98 fL — ABNORMAL HIGH (ref 79–97)
Monocytes Absolute: 0.5 10*3/uL (ref 0.1–0.9)
Monocytes: 14 %
Neutrophils Absolute: 2 10*3/uL (ref 1.4–7.0)
Neutrophils: 59 %
Platelets: 265 10*3/uL (ref 150–450)
RBC: 3.98 x10E6/uL (ref 3.77–5.28)
RDW: 13 % (ref 11.7–15.4)
WBC: 3.4 10*3/uL (ref 3.4–10.8)

## 2021-08-18 NOTE — Telephone Encounter (Signed)
[employer] Kelly Grant) called form submitted was incorrect. Have faxed over correct on Thursday, 08/17/21,afternoon. Requesting completion of initial coverage form.   Fax to:[phone]  Phone: [phone] option 3, option 4, option 2 to reach missing clinical information line. Need response as soon possible, resending the form today.

## 2021-08-21 NOTE — Telephone Encounter (Signed)
I called the number back and spoke with pharmacist Juliann Pulse. Approval for med has been obtained and letter with insurance approval to follow with ref # and approval dates.

## 2021-08-22 ENCOUNTER — Telehealth: Payer: Self-pay | Admitting: Neurology

## 2021-08-22 ENCOUNTER — Encounter: Payer: Self-pay | Admitting: Neurology

## 2021-08-22 NOTE — Telephone Encounter (Signed)
BCBS Josem Kaufmann: [id number] exp. 08/22/21-09/20/21 Medicaid Healthy Merrilyn Puma: [id number] exp. 08/22/21-10/20/21 sent to GI

## 2021-08-23 NOTE — Telephone Encounter (Signed)
I completed a conference call with the patient and [facility] [phone]). No further issues with the claim being paid. A shipment is scheduled to be delivered on 08/25/21 with a $4.00.

## 2021-08-31 ENCOUNTER — Ambulatory Visit
Admission: RE | Admit: 2021-08-31 | Discharge: 2021-08-31 | Disposition: A | Discharge status: Home / Self Care | Payer: BC Managed Care – PPO | Source: Ambulatory Visit | Attending: Neurology | Admitting: Neurology

## 2021-08-31 DIAGNOSIS — G35 Multiple sclerosis: Secondary | ICD-10-CM | POA: Diagnosis not present

## 2021-08-31 MED ORDER — GADOBENATE DIMEGLUMINE 529 MG/ML IV SOLN
13.0000 mL | Freq: Once | INTRAVENOUS | Status: AC | PRN
  Administered 2021-08-31: 13 mL via INTRAVENOUS

## 2021-09-11 ENCOUNTER — Other Ambulatory Visit: Payer: Self-pay | Admitting: Nurse Practitioner

## 2021-09-11 ENCOUNTER — Telehealth: Payer: Self-pay

## 2021-09-11 DIAGNOSIS — B009 Herpesviral infection, unspecified: Secondary | ICD-10-CM

## 2021-09-11 NOTE — Telephone Encounter (Signed)
Valacyclovir

## 2021-09-29 ENCOUNTER — Ambulatory Visit (INDEPENDENT_AMBULATORY_CARE_PROVIDER_SITE_OTHER): Payer: BC Managed Care – PPO | Admitting: Nurse Practitioner

## 2021-09-29 ENCOUNTER — Encounter: Payer: Self-pay | Admitting: Nurse Practitioner

## 2021-09-29 ENCOUNTER — Ambulatory Visit: Payer: BC Managed Care – PPO | Admitting: Nurse Practitioner

## 2021-09-29 ENCOUNTER — Ambulatory Visit (HOSPITAL_COMMUNITY)
Admission: RE | Admit: 2021-09-29 | Discharge: 2021-09-29 | Disposition: A | Discharge status: Home / Self Care | Payer: BC Managed Care – PPO | Source: Ambulatory Visit | Attending: Nurse Practitioner | Admitting: Nurse Practitioner

## 2021-09-29 VITALS — BP 134/80 | HR 63 | Temp 98.0°F | Ht 64.0 in | Wt 143.2 lb

## 2021-09-29 DIAGNOSIS — G8929 Other chronic pain: Secondary | ICD-10-CM

## 2021-09-29 DIAGNOSIS — M25562 Pain in left knee: Secondary | ICD-10-CM | POA: Insufficient documentation

## 2021-09-29 DIAGNOSIS — E785 Hyperlipidemia, unspecified: Secondary | ICD-10-CM | POA: Insufficient documentation

## 2021-09-29 NOTE — Patient Instructions (Addendum)
1. Hyperlipidemia, unspecified hyperlipidemia type  - Lipid Panel   2. Chronic pain of left knee  - DG Knee Complete 4 Views Left    Follow up:  Follow up in 6 months    Fat and Cholesterol Restricted Eating Plan Getting too much fat and cholesterol in your diet may cause health problems. Choosing the right foods helps keep your fat and cholesterol at normal levels. This can keep you from getting certain diseases. What are tips for following this plan? General tips Work with your doctor to lose weight if you need to. Avoid: Foods with added sugar. Fried foods. Foods with trans fat or partially hydrogenated oils. This includes some margarines and baked goods. If you drink alcohol: Limit how much you have to: 0-1 drink a day for women who are not pregnant. 0-2 drinks a day for men. Know how much alcohol is in a drink. In the [place] one drink equals one 12 oz bottle of beer (355 mL), one 5 oz glass of wine (148 mL), or one 1 oz glass of hard liquor (44 mL). Reading food labels Check food labels for: Trans fats. Partially hydrogenated oils. Saturated fat (g) in each serving. Cholesterol (mg) in each serving. Fiber (g) in each serving. Choose foods with healthy fats, such as: Monounsaturated fats and polyunsaturated fats. These include olive and canola oil, flaxseeds, walnuts, almonds, and seeds. Omega-3 fats. These are found in certain fish, flaxseed oil, and ground flaxseeds. Choose grain products that have whole grains. Look for the word "whole" as the first word in the ingredient list. Cooking Cook foods using low-fat methods. These include baking, boiling, grilling, and broiling. Eat more home-cooked foods. Eat at restaurants and buffets less often. Eat less fast food. Avoid cooking using saturated fats, such as butter, cream, palm oil, palm kernel oil, and coconut oil. Meal planning  At meals, divide your plate into four equal parts: Fill one-half of your plate with  vegetables, green salads, and fruit. Fill one-fourth of your plate with whole grains. Fill one-fourth of your plate with low-fat (lean) protein foods. Eat fish that is high in omega-3 fats at least two times a week. This includes mackerel, tuna, sardines, and salmon. Eat foods that are high in fiber, such as whole grains, beans, apples, pears, berries, broccoli, carrots, peas, and barley. What foods should I eat? Fruits All fresh, canned (in natural juice), or frozen fruits. Vegetables Fresh or frozen vegetables (raw, steamed, roasted, or grilled). Green salads. Grains Whole grains, such as whole wheat or whole grain breads, crackers, cereals, and pasta. Unsweetened oatmeal, bulgur, barley, quinoa, or brown rice. Corn or whole wheat flour tortillas. Meats and other protein foods Ground beef (85% or leaner), grass-fed beef, or beef trimmed of fat. Skinless chicken or [place]. Ground chicken or [place]. Pork trimmed of fat. All fish and seafood. Egg whites. Dried beans, peas, or lentils. Unsalted nuts or seeds. Unsalted canned beans. Nut butters without added sugar or oil. Dairy Low-fat or nonfat dairy products, such as skim or 1% milk, 2% or reduced-fat cheeses, low-fat and fat-free ricotta or cottage cheese, or plain low-fat and nonfat yogurt. Fats and oils Tub margarine without trans fats. Light or reduced-fat mayonnaise and salad dressings. Avocado. Olive, canola, sesame, or safflower oils. The items listed above may not be a complete list of foods and beverages you can eat. Contact a dietitian for more information. What foods should I avoid? Fruits Canned fruit in heavy syrup. Fruit in cream or butter sauce.  Fried fruit. Vegetables Vegetables cooked in cheese, cream, or butter sauce. Fried vegetables. Grains White bread. White pasta. White rice. Cornbread. Bagels, pastries, and croissants. Crackers and snack foods that contain trans fat and hydrogenated oils. Meats and other protein  foods Fatty cuts of meat. Ribs, chicken wings, bacon, sausage, bologna, salami, chitterlings, fatback, hot dogs, bratwurst, and packaged lunch meats. Liver and organ meats. Whole eggs and egg yolks. Chicken and [place] with skin. Fried meat. Dairy Whole or 2% milk, cream, half-and-half, and cream cheese. Whole milk cheeses. Whole-fat or sweetened yogurt. Full-fat cheeses. Nondairy creamers and whipped toppings. Processed cheese, cheese spreads, and cheese curds. Fats and oils Butter, stick margarine, lard, shortening, ghee, or bacon fat. Coconut, palm kernel, and palm oils. Beverages Alcohol. Sugar-sweetened drinks such as sodas, lemonade, and fruit drinks. Sweets and desserts Corn syrup, sugars, honey, and molasses. Candy. Jam and jelly. Syrup. Sweetened cereals. Cookies, pies, cakes, donuts, muffins, and ice cream. The items listed above may not be a complete list of foods and beverages you should avoid. Contact a dietitian for more information. Summary Choosing the right foods helps keep your fat and cholesterol at normal levels. This can keep you from getting certain diseases. At meals, fill one-half of your plate with vegetables, green salads, and fruits. Eat high fiber foods, like whole grains, beans, apples, pears, berries, carrots, peas, and barley. Limit added sugar, saturated fats, alcohol, and fried foods. This information is not intended to replace advice given to you by your health care provider. Make sure you discuss any questions you have with your health care provider. Document Revised: 05/20/2020 Document Reviewed: 05/20/2020 Elsevier Patient Education  [place].

## 2021-09-29 NOTE — Assessment & Plan Note (Signed)
-   Lipid Panel   2. Chronic pain of left knee  - DG Knee Complete 4 Views Left    Follow up:  Follow up in 6 months

## 2021-09-29 NOTE — Progress Notes (Signed)
$'@Patient'a$  ID: Kelly Grant, female    DOB: 03-22-1965, 56 y.o.   MRN: [id number]  Chief Complaint  Patient presents with   Follow-up    Pt is here for 6 month's follow up visit    Referring provider: No ref. provider found   HPI  Kelly JOERGER presents for follow up. She . has a past medical history of Abnormal Pap smear, Chlamydia, History of chicken pox, HSV infection, Multiple sclerosis ([place]), Neuromuscular disorder ([place]), and Yeast infection.   She is here for follow up of dyslipidemia. The patient does use medications that may worsen dyslipidemias (corticosteroids, progestins, anabolic steroids, diuretics, beta-blockers, amiodarone, cyclosporine, olanzapine). She is active on her job. The patient is not known to have coexisting coronary artery disease.    Denies headache, dizziness, visual changes, shortness of breath, dyspnea on exertion, chest pain, nausea, vomiting or any edema.    She follows up with neurology for her multiple sclerosis.  He denies any new concerns or changes in treatment  She does complain of left knee pain and swelling from injury 1 year ago. Decreased ROM. Will order xray.  Allergies  Allergen Reactions   Macrobid [Nitrofurantoin Macrocrystal] Hives    Immunization History  Administered Date(s) Administered   Influenza,inj,Quad PF,6+ Mos 11/05/2013, 10/08/2014, 11/04/2016, 11/23/2017   Influenza-Unspecified 11/23/2017   PFIZER(Purple Top)SARS-COV-2 Vaccination 04/07/2019, 04/28/2019, 01/03/2020   Tdap 03/04/2012, 08/05/2013    Past Medical History:  Diagnosis Date   Abnormal Pap smear    ASC-H   Chlamydia    History of chicken pox    HSV infection    Multiple sclerosis ([place])    Neuromuscular disorder ([place])    MS   Yeast infection     Tobacco History: Social History   Tobacco Use  Smoking Status Former   Types: Cigarettes   Quit date: 01/23/2000   Years since quitting: 21.6  Smokeless Tobacco Not on file   Counseling given: Not  Answered   Outpatient Encounter Medications as of 09/29/2021  Medication Sig   aspirin EC 81 MG tablet Take 1 tablet (81 mg total) by mouth daily.   Boswellia-Glucosamine-Vit D (OSTEO BI-FLEX ONE PER DAY PO) Take 2 tablets by mouth daily at 6 (six) AM.   calcium-vitamin D (OSCAL WITH D) 500-200 MG-UNIT tablet Take 2 tablets by mouth.    carboxymethylcellulose (REFRESH PLUS) 0.5 % SOLN 1 drop daily as needed.   Fingolimod HCl 0.5 MG CAPS TAKE ONE CAPSULE BY MOUTH ONCE DAILY. MAY TAKE WITH OR WITHOUT FOOD. STORE AT ROOM TEMPERATURE.   Multiple Vitamin (MULTIVITAMIN) tablet Take 1 tablet by mouth daily.   omega-3 acid ethyl esters (LOVAZA) 1 g capsule Take 1 capsule (1 g total) by mouth daily.   Probiotic Product (PROBIOTIC PO) Take 1 capsule by mouth daily at 6 (six) AM.   rosuvastatin (CRESTOR) 10 MG tablet TAKE 1 TABLET(10 MG) BY MOUTH AT BEDTIME   valACYclovir (VALTREX) 500 MG tablet TAKE 1 TABLET(500 MG) BY MOUTH DAILY   Wheat Dextrin (BENEFIBER DRINK MIX PO) Take by mouth. 2 tsp TID   No facility-administered encounter medications on file as of 09/29/2021.     Review of Systems  Review of Systems  Constitutional: Negative.   HENT: Negative.    Cardiovascular: Negative.   Gastrointestinal: Negative.   Musculoskeletal:        Chronic left knee pain  Allergic/Immunologic: Negative.   Neurological: Negative.   Psychiatric/Behavioral: Negative.         Physical Exam  BP 134/80 (BP Location: Left Arm, Patient Position: Sitting, Cuff Size: Normal)   Pulse 63   Temp 98 F (36.7 C)   Ht '5\' 4"'$  (1.626 m)   Wt 143 lb 4 oz (65 kg)   SpO2 100%   BMI 24.59 kg/m   Wt Readings from Last 5 Encounters:  09/29/21 143 lb 4 oz (65 kg)  08/17/21 145 lb 8 oz (66 kg)  07/28/21 150 lb (68 kg)  06/07/21 150 lb (68 kg)  04/11/21 150 lb 3.2 oz (68.1 kg)     Physical Exam Vitals and nursing note reviewed.  Constitutional:      General: She is not in acute distress.    Appearance: She  is well-developed.  Cardiovascular:     Rate and Rhythm: Normal rate and regular rhythm.  Pulmonary:     Effort: Pulmonary effort is normal.     Breath sounds: Normal breath sounds.  Musculoskeletal:     Left knee: Decreased range of motion. Tenderness present.  Neurological:     Mental Status: She is alert and oriented to person, place, and time.       Assessment & Plan:   Hyperlipidemia - Lipid Panel   2. Chronic pain of left knee  - DG Knee Complete 4 Views Left    Follow up:  Follow up in 6 months     Fenton Foy, NP 09/29/2021

## 2021-09-30 LAB — LIPID PANEL
Chol/HDL Ratio: 2.5 ratio (ref 0.0–4.4)
Cholesterol, Total: 163 mg/dL (ref 100–199)
HDL: 64 mg/dL (ref >39)
LDL Chol Calc (NIH): 83 mg/dL (ref 0–99)
Triglycerides: 83 mg/dL (ref 0–149)
VLDL Cholesterol Cal: 16 mg/dL (ref 5–40)

## 2021-10-02 ENCOUNTER — Other Ambulatory Visit: Payer: Self-pay | Admitting: Nurse Practitioner

## 2021-10-02 DIAGNOSIS — G8929 Other chronic pain: Secondary | ICD-10-CM

## 2021-10-02 DIAGNOSIS — R936 Abnormal findings on diagnostic imaging of limbs: Secondary | ICD-10-CM

## 2021-10-03 NOTE — Progress Notes (Signed)
Patient saw provider message regarding lab results in [place].

## 2021-10-05 ENCOUNTER — Ambulatory Visit (INDEPENDENT_AMBULATORY_CARE_PROVIDER_SITE_OTHER): Payer: BC Managed Care – PPO | Admitting: Sports Medicine

## 2021-10-05 ENCOUNTER — Encounter: Payer: Self-pay | Admitting: Sports Medicine

## 2021-10-05 VITALS — BP 139/76 | HR 64 | Ht 64.0 in | Wt 143.0 lb

## 2021-10-05 DIAGNOSIS — M25562 Pain in left knee: Secondary | ICD-10-CM

## 2021-10-05 DIAGNOSIS — G8929 Other chronic pain: Secondary | ICD-10-CM

## 2021-10-05 DIAGNOSIS — M25662 Stiffness of left knee, not elsewhere classified: Secondary | ICD-10-CM | POA: Diagnosis not present

## 2021-10-05 MED ORDER — MELOXICAM 15 MG PO TABS: 15.0000 mg | ORAL_TABLET | Freq: Every day | ORAL | 1 refills | Status: DC

## 2021-10-05 NOTE — Progress Notes (Signed)
Kelly Grant - 56 y.o. female MRN [id number]  Date of birth: [birth year]  Office Visit Note: Visit Date: 10/05/2021 PCP: Fenton Foy, NP Referred by: Fenton Foy, NP  Subjective: Chief Complaint  Patient presents with   Left Knee - Pain   HPI: Kelly Grant is a pleasant 56 y.o. female who presents today for acute on chronic left knee pain.  She has had left knee pain for about a year.  Unsure of any specific event, but did note that noticed the knee started hurting after she was pulling a cart at work stepping off a stool and felt some pain in the knee.  Reports the pain is all throughout the knee although worse over the anterior aspect and medial compartment.  She has tried oral prednisone without much relief.  In general her pain waxes and wanes, although on average is a 4/10.  She takes Tylenol occasionally although nothing consistently.  Denies any significant swelling within the knee.  She does note that her range of motion is more limited on this knee than the opposite knee.  Pertinent ROS were reviewed with the patient and found to be negative unless otherwise specified above in HPI.   Assessment & Plan: Visit Diagnoses:  1. Chronic pain of left knee   2. Knee stiffness, left    Plan: Discussed the etiology of her knee pain today, her x-rays only show a mild degree of arthritis.  She does not have the classic history for a meniscal injury and provocative testing suggest against an acute injury, however she does have a mild extension lag and is unable to extend the knee past about 7 degrees and has some mild limitation in flexion as well.  Unsure whether this is stiffness from favoring the knee or intra-articular pathology.  She does have notable quadricep and VMO weakness in addition.  Discussed with her it is imperative to get her started in physical therapy to work on increasing her range of motion as well as strengthening around the knee.  We will start her on  meloxicam 15 mg, she is to take this for the next 7-10 days scheduled, then only as needed.  We will see what kind of progress she makes with physical therapy and follow back up in about 6 weeks.  If she continues to have limited range of motion with therapy, neck steps may be getting advanced imaging such as MRI to better quantify the limitation of ROM.  Follow-up: Return in about 6 weeks (around 11/16/2021).   Meds & Orders:  Meds ordered this encounter  Medications   meloxicam (MOBIC) 15 MG tablet    Sig: Take 1 tablet (15 mg total) by mouth daily.    Dispense:  30 tablet    Refill:  1    Orders Placed This Encounter  Procedures   Ambulatory referral to Physical Therapy     Procedures: No procedures performed      Clinical History: No specialty comments available.  She reports that she quit smoking about 21 years ago. Her smoking use included cigarettes. She does not have any smokeless tobacco history on file. No results for input(s): "HGBA1C", "LABURIC" in the last 8760 hours.  Objective:   Vital Signs: BP 139/76   Pulse 64   Ht _0  (1.626 m)   Wt 143 lb (64.9 kg)   BMI 24.55 kg/m   Physical Exam  Gen: Well-appearing, in no acute distress; non-toxic CV: Regular Rate.  Well-perfused. Warm.  Resp: Breathing unlabored on room air; no wheezing. Psych: Fluid speech in conversation; appropriate affect; normal thought process Neuro: Sensation intact throughout. No gross coordination deficits.   Ortho Exam -Left knee: There is some generalized tenderness throughout the anterior aspect of the knee, more so over the medial joint line.  No redness or gross effusion appreciated.  Her range of motion is limited from about 7-125 degrees, compared to 0-135 degrees of the contralateral knee.  Ligamental testing appears intact.  No true reproduction of pain or clicking with McMurray's testing.  Negative Lachman.  Neurovascular intact distally.  Imaging: DG Knee Complete 4 Views  Left CLINICAL DATA:  Chronic left knee pain for 1 year.  EXAM: LEFT KNEE - COMPLETE 4+ VIEW  COMPARISON:  Left knee radiographs 03/04/2012  FINDINGS: Minimal medial compartment joint space narrowing. Mild lateral greater than medial compartment peripheral degenerative osteophytosis. Increased moderate superior and unchanged minimal inferior patellar degenerative osteophytosis. No joint effusion. No acute fracture or dislocation.  IMPRESSION: Mild tricompartmental osteoarthritis, mildly worsened from prior remote radiographs.  Electronically Signed   By: Yvonne Kendall M.D.   On: 09/29/2021 18:03    Past Medical/Family/Surgical/Social History: Medications & Allergies reviewed per EMR, new medications updated. Patient Active Problem List   Diagnosis Date Noted   Hyperlipidemia 09/29/2021   Encounter for therapeutic drug monitoring 12/13/2014   Multiple sclerosis ([place]) 05/08/2012   Pap smear abnormality of cervix 06/22/2011   HSV infection 06/22/2011   Past Medical History:  Diagnosis Date   Abnormal Pap smear    ASC-H   Chlamydia    History of chicken pox    HSV infection    Multiple sclerosis ([place])    Neuromuscular disorder ([place])    MS   Yeast infection    Family History  Problem Relation Age of Onset   Colon polyps Mother    Kidney failure Mother    Diabetes Mother    Colon cancer Neg Hx    Esophageal cancer Neg Hx    Rectal cancer Neg Hx    Stomach cancer Neg Hx    Past Surgical History:  Procedure Laterality Date   CARPAL TUNNEL RELEASE Left    in addition to wrist fracture surgery   CESAREAN SECTION     x2   COLONOSCOPY  2016   [id number](exc)-TA   INCISION AND DRAINAGE ABSCESS Left    left buttock   POLYPECTOMY  2016   TA   TUBAL LIGATION     Social History   Occupational History   Occupation: HOUSEKEEPING    Employer: [employer]  Tobacco Use   Smoking status: Former    Types: Cigarettes    Quit date: 01/23/2000    Years since  quitting: 21.7   Smokeless tobacco: Not on file  Vaping Use   Vaping Use: Never used  Substance and Sexual Activity   Alcohol use: Yes    Comment: rarely   Drug use: No   Sexual activity: Not Currently    Partners: Male    Birth control/protection: Surgical    Comment: tubal ligation per pt

## 2021-10-05 NOTE — Progress Notes (Signed)
Pain for 1 year. Possible injury at work but started swelling after stepping off step stool.Works on a Therapist, occupational. No heavy lifting with job. Pain all over knee. Tired prednisone. Hasnt had injections or PT. Takes tylenol as needed   Average Pain level 4/10

## 2021-11-16 ENCOUNTER — Ambulatory Visit: Payer: BC Managed Care – PPO | Admitting: Sports Medicine

## 2021-12-13 DIAGNOSIS — Z1231 Encounter for screening mammogram for malignant neoplasm of breast: Secondary | ICD-10-CM | POA: Diagnosis not present

## 2022-01-23 ENCOUNTER — Ambulatory Visit: Payer: BC Managed Care – PPO | Admitting: Dermatology

## 2022-01-24 ENCOUNTER — Telehealth: Payer: Self-pay | Admitting: Nurse Practitioner

## 2022-01-24 NOTE — Telephone Encounter (Signed)
Was routed to provider. [place]

## 2022-01-24 NOTE — Telephone Encounter (Signed)
Caller & Relationship to patient:  MRN #  [id number]   Call Back Number:   Date of Last Office Visit: 09/29/2021     Date of Next Office Visit: 03/30/2022    Medication(s) to be Refilled: Valacyclovir   Preferred Pharmacy:   ** Please notify patient to allow 48-72 hours to process** **Let patient know to contact pharmacy at the end of the day to make sure medication is ready. ** **If patient has not been seen in a year or longer, book an appointment **Advise to use MyChart for refill requests OR to contact their pharmacy

## 2022-01-25 ENCOUNTER — Other Ambulatory Visit: Payer: Self-pay | Admitting: Nurse Practitioner

## 2022-01-25 DIAGNOSIS — B009 Herpesviral infection, unspecified: Secondary | ICD-10-CM

## 2022-01-25 MED ORDER — VALACYCLOVIR HCL 500 MG PO TABS: ORAL_TABLET | ORAL | 0 refills | Status: DC

## 2022-03-13 NOTE — Progress Notes (Unsigned)
PATIENT: Kelly Grant DOB: 1965-12-20  REASON FOR VISIT: follow up MS HISTORY FROM: patient Primary Neurologist: Dr. Krista Blue   HISTORY OF PRESENT ILLNESS: Today 03/14/22 Doing well, remains on Gilenya generic. MS is stable. Vision is fine, wears glasses, gets eye exam annually, arms and legs are fine, B/b are fine no accidents. No falls, balance is good. No health issues.  No issues or concerns to discuss.  Update 08/17/21 SS: Kelly Grant is here today for follow-up.  MRI of the brain in July 2021 showed multiple chronic plaques consistent with MS, the MRI was stable from previous in 2018.  She remains on Gilenya, is on generic. MS is stable. Denies any changes to vision, saw eye doctor, no problems mentioned, does wear glasses. No numbness or weakness noted. Has urinary urgency. No falls. No changes to balance. Continues to work full time.   Update 02/01/2021 SS: Kelly Grant here today for follow-up with history of RRMS on Gilenya.  Continues to do well.  Has no new MS symptoms.  Continues to work full-time, her job is physical, may have weakness of left arm with overuse.  No falls.  Her health has been good.  Here today unaccompanied.  Update 07/19/2020 SS: Kelly Grant is a 57 year old female with history of RRMS on Gilenya.  MRI of the brain in July 2021 was overall stable compared to previous in June 2018.  There were no new lesions.  Remains overall stable.  With overuse, will note weakness to the left arm.  She works for [employer], is physical job. Works 6 PM-3 AM. 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 No falls.  Has annual evaluation with Dr. Katy Fitch.  Denies any new problems or concerns.  Feels MS is overall stable.  REVIEW OF SYSTEMS: Out of a complete 14 system review of symptoms, the patient complains only of the following symptoms, and all other reviewed systems are negative.  See HPI  ALLERGIES: Allergies  Allergen Reactions   Macrobid [Nitrofurantoin Macrocrystal]  Hives    HOME MEDICATIONS: Outpatient Medications Prior to Visit  Medication Sig Dispense Refill   aspirin EC 81 MG tablet Take 1 tablet (81 mg total) by mouth daily. 30 tablet 11   Boswellia-Glucosamine-Vit D (OSTEO BI-FLEX ONE PER DAY PO) Take 2 tablets by mouth daily at 6 (six) AM.     calcium-vitamin D (OSCAL WITH D) 500-200 MG-UNIT tablet Take 2 tablets by mouth.      carboxymethylcellulose (REFRESH PLUS) 0.5 % SOLN 1 drop daily as needed.     meloxicam (MOBIC) 15 MG tablet Take 1 tablet (15 mg total) by mouth daily. 30 tablet 1   Multiple Vitamin (MULTIVITAMIN) tablet Take 1 tablet by mouth daily. 30 tablet 3   omega-3 acid ethyl esters (LOVAZA) 1 g capsule Take 1 capsule (1 g total) by mouth daily. 90 capsule 3   Probiotic Product (PROBIOTIC PO) Take 1 capsule by mouth daily at 6 (six) AM.     rosuvastatin (CRESTOR) 10 MG tablet TAKE 1 TABLET(10 MG) BY MOUTH AT BEDTIME 90 tablet 3   valACYclovir (VALTREX) 500 MG tablet Take 1 tablet (564m) daily PO 90 tablet 0   Wheat Dextrin (BENEFIBER DRINK MIX PO) Take by mouth. 2 tsp TID     Fingolimod HCl 0.5 MG CAPS TAKE ONE CAPSULE BY MOUTH ONCE DAILY. MAY TAKE WITH OR WITHOUT FOOD. STORE AT ROOM TEMPERATURE. 90 capsule 3   No facility-administered medications prior to visit.    PAST MEDICAL  HISTORY: Past Medical History:  Diagnosis Date   Abnormal Pap smear    ASC-H   Chlamydia    History of chicken pox    HSV infection    Multiple sclerosis ([place])    Neuromuscular disorder ([place])    MS   Yeast infection     PAST SURGICAL HISTORY: Past Surgical History:  Procedure Laterality Date   CARPAL TUNNEL RELEASE Left    in addition to wrist fracture surgery   CESAREAN SECTION     x2   COLONOSCOPY  2016   DJ-MAC-movi(exc)-TA   INCISION AND DRAINAGE ABSCESS Left    left buttock   POLYPECTOMY  2016   TA   TUBAL LIGATION      FAMILY HISTORY: Family History  Problem Relation Age of Onset   Colon polyps Mother    Kidney failure  Mother    Diabetes Mother    Colon cancer Neg Hx    Esophageal cancer Neg Hx    Rectal cancer Neg Hx    Stomach cancer Neg Hx     SOCIAL HISTORY: Social History   Socioeconomic History   Marital status: Single    Spouse name: Not on file   Number of children: 2   Years of education: College    Highest education level: Not on file  Occupational History   Occupation: HOUSEKEEPING    Employer: [employer]  Tobacco Use   Smoking status: Former    Types: Cigarettes    Quit date: 01/23/2000    Years since quitting: 22.1   Smokeless tobacco: Not on file  Vaping Use   Vaping Use: Never used  Substance and Sexual Activity   Alcohol use: Yes    Comment: rarely   Drug use: No   Sexual activity: Not Currently    Partners: Male    Birth control/protection: Surgical    Comment: tubal ligation per pt  Other Topics Concern   Not on file  Social History Narrative   Patient lives at home with children.    Patient has 2 children.    Patient has a college degree.    Patient is single.    Patient is right handed.    Patient is currently employed.    Social Determinants of Health   Financial Resource Strain: Not on file  Food Insecurity: Not on file  Transportation Needs: Not on file  Physical Activity: Not on file  Stress: Not on file  Social Connections: Not on file  Intimate Partner Violence: Not on file   PHYSICAL EXAM  Vitals:   03/14/22 1037  BP: (!) 157/90  Pulse: 62  Weight: 143 lb (64.9 kg)  Height: 5' 4"$  (1.626 m)     Body mass index is 24.55 kg/m.  Generalized: Well developed, in no acute distress   Neurological examination  Mentation: Alert oriented to time, place, history taking. Follows all commands speech and language fluent Cranial nerve II-XII: Pupils were equal round reactive to light. Extraocular movements were full, visual field were full on confrontational test. Facial sensation and strength were normal. Head turning and shoulder shrug were  normal and symmetric. Motor: 5/5 strength all extremities Sensory: Sensory testing is intact to soft touch on all 4 extremities. No evidence of extinction is noted.  Coordination: Cerebellar testing reveals good finger-nose-finger and heel-to-shin bilaterally.  Gait and station: Gait is normal. Tandem gait is normal.  Reflexes: Deep tendon reflexes are symmetric and normal bilaterally.   DIAGNOSTIC DATA (LABS, IMAGING, TESTING) -  I reviewed patient records, labs, notes, testing and imaging myself where available.  Lab Results  Component Value Date   WBC 3.4 08/17/2021   HGB 13.2 08/17/2021   HCT 38.9 08/17/2021   MCV 98 (H) 08/17/2021   PLT 265 08/17/2021      Component Value Date/Time   NA 142 08/17/2021 1012   K 4.0 08/17/2021 1012   CL 105 08/17/2021 1012   CO2 22 08/17/2021 1012   GLUCOSE 89 08/17/2021 1012   GLUCOSE 80 02/17/2015 1353   BUN 11 08/17/2021 1012   CREATININE 0.76 08/17/2021 1012   CREATININE 0.77 02/17/2015 1353   CALCIUM 9.6 08/17/2021 1012   PROT 6.3 08/17/2021 1012   ALBUMIN 4.5 08/17/2021 1012   AST 32 08/17/2021 1012   ALT 29 08/17/2021 1012   ALKPHOS 88 08/17/2021 1012   BILITOT 0.3 08/17/2021 1012   GFRNONAA 93 01/19/2020 1039   GFRNONAA >89 02/17/2015 1353   GFRAA 107 01/19/2020 1039   GFRAA >89 02/17/2015 1353   Lab Results  Component Value Date   CHOL 163 09/29/2021   HDL 64 09/29/2021   LDLCALC 83 09/29/2021   TRIG 83 09/29/2021   CHOLHDL 2.5 09/29/2021   Lab Results  Component Value Date   HGBA1C 5.2 02/17/2016   Lab Results  Component Value Date   VITAMINB12 682 02/18/2019   Lab Results  Component Value Date   TSH 0.927 12/19/2016    ASSESSMENT AND PLAN 57 y.o. year old female  has a past medical history of Abnormal Pap smear, Chlamydia, History of chicken pox, HSV infection, Multiple sclerosis ([place]), Neuromuscular disorder ([place]), and Yeast infection. here with:  1.  Relapsing remitting multiple sclerosis  -MS remains  stable, continue Gilenya -Check routine lab today -MRI of the brain with and without contrast in August 2023 showed stable lesions, no change from July 2021 -Try to start exercising -Follow-up in 6 months or sooner if needed  Butler Denmark, AGNP-C, DNP 03/14/2022, 11:11 AM [facility] [address] [place],  [address] [phone]

## 2022-03-14 ENCOUNTER — Ambulatory Visit (INDEPENDENT_AMBULATORY_CARE_PROVIDER_SITE_OTHER): Payer: BC Managed Care – PPO | Admitting: Neurology

## 2022-03-14 ENCOUNTER — Encounter: Payer: Self-pay | Admitting: Neurology

## 2022-03-14 VITALS — BP 157/90 | HR 62 | Ht 64.0 in | Wt 143.0 lb

## 2022-03-14 DIAGNOSIS — G35 Multiple sclerosis: Secondary | ICD-10-CM

## 2022-03-14 MED ORDER — FINGOLIMOD HCL 0.5 MG PO CAPS
ORAL_CAPSULE | ORAL | 3 refills | Status: DC
  Filled 2022-04-06: qty 90, 90d supply, fill #0
  Filled 2022-04-10: qty 30, 30d supply, fill #0
  Filled 2022-05-21: qty 30, 30d supply, fill #1
  Filled 2022-06-06: qty 90, 30d supply, fill #1
  Filled 2022-06-06 (×2): qty 30, 30d supply, fill #1
  Filled 2022-07-02: qty 30, 30d supply, fill #2
  Filled 2022-07-25 – 2022-08-02 (×2): qty 30, 30d supply, fill #3
  Filled 2022-09-11: qty 30, 30d supply, fill #4

## 2022-03-14 NOTE — Patient Instructions (Signed)
Great to see you today  Continue the Gilenya  Check labs today  Try to exercise  See you back in 6 months

## 2022-03-15 LAB — CBC WITH DIFFERENTIAL/PLATELET
Basophils Absolute: 0 10*3/uL (ref 0.0–0.2)
Basos: 0 %
EOS (ABSOLUTE): 0 10*3/uL (ref 0.0–0.4)
Eos: 1 %
Hematocrit: 40.3 % (ref 34.0–46.6)
Hemoglobin: 13.2 g/dL (ref 11.1–15.9)
Immature Grans (Abs): 0 10*3/uL (ref 0.0–0.1)
Immature Granulocytes: 0 %
Lymphocytes Absolute: 0.8 10*3/uL (ref 0.7–3.1)
Lymphs: 23 %
MCH: 32.2 pg (ref 26.6–33.0)
MCHC: 32.8 g/dL (ref 31.5–35.7)
MCV: 98 fL — ABNORMAL HIGH (ref 79–97)
Monocytes Absolute: 0.5 10*3/uL (ref 0.1–0.9)
Monocytes: 15 %
Neutrophils Absolute: 2.1 10*3/uL (ref 1.4–7.0)
Neutrophils: 61 %
Platelets: 256 10*3/uL (ref 150–450)
RBC: 4.1 x10E6/uL (ref 3.77–5.28)
RDW: 12.4 % (ref 11.7–15.4)
WBC: 3.5 10*3/uL (ref 3.4–10.8)

## 2022-03-15 LAB — COMPREHENSIVE METABOLIC PANEL
ALT: 36 IU/L — ABNORMAL HIGH (ref 0–32)
AST: 33 IU/L (ref 0–40)
Albumin/Globulin Ratio: 2.2 (ref 1.2–2.2)
Albumin: 4.3 g/dL (ref 3.8–4.9)
Alkaline Phosphatase: 83 IU/L (ref 44–121)
BUN/Creatinine Ratio: 17 (ref 9–23)
BUN: 12 mg/dL (ref 6–24)
Bilirubin Total: 0.3 mg/dL (ref 0.0–1.2)
CO2: 23 mmol/L (ref 20–29)
Calcium: 9.3 mg/dL (ref 8.7–10.2)
Chloride: 106 mmol/L (ref 96–106)
Creatinine, Ser: 0.7 mg/dL (ref 0.57–1.00)
Globulin, Total: 2 g/dL (ref 1.5–4.5)
Glucose: 81 mg/dL (ref 70–99)
Potassium: 4.2 mmol/L (ref 3.5–5.2)
Sodium: 143 mmol/L (ref 134–144)
Total Protein: 6.3 g/dL (ref 6.0–8.5)
eGFR: 101 mL/min/{1.73_m2} (ref >59)

## 2022-03-30 ENCOUNTER — Encounter: Payer: Self-pay | Admitting: Nurse Practitioner

## 2022-03-30 ENCOUNTER — Ambulatory Visit (INDEPENDENT_AMBULATORY_CARE_PROVIDER_SITE_OTHER): Payer: BC Managed Care – PPO | Admitting: Nurse Practitioner

## 2022-03-30 VITALS — BP 128/64 | HR 73 | Temp 97.2°F | Ht 64.0 in | Wt 145.6 lb

## 2022-03-30 DIAGNOSIS — E785 Hyperlipidemia, unspecified: Secondary | ICD-10-CM | POA: Diagnosis not present

## 2022-03-30 NOTE — Assessment & Plan Note (Signed)
-   Lipid Panel   Follow up:  Follow up in 6 months

## 2022-03-30 NOTE — Patient Instructions (Addendum)
1. Hyperlipidemia, unspecified hyperlipidemia type  - Lipid Panel   Follow up:  Follow up in 6 months

## 2022-03-30 NOTE — Progress Notes (Signed)
$'@Patient'M$  ID: Kelly Grant, female    DOB: Nov 06, 1965, 57 y.o.   MRN: [id number]  Chief Complaint  Patient presents with   Hyperlipidemia    Five hours fasting     Referring provider: Fenton Foy, NP   HPI  Kelly Grant presents for follow up. She . has a past medical history of Abnormal Pap smear, Chlamydia, History of chicken pox, HSV infection, Multiple sclerosis ([place]), Neuromuscular disorder ([place]), and Yeast infection.    She is here for follow up of dyslipidemia.  Patient states that she has been doing well since her last visit here.  No new issues or concerns today.  She is active on her job. The patient is not known to have coexisting coronary artery disease.    Denies headache, dizziness, visual changes, shortness of breath, dyspnea on exertion, chest pain, nausea, vomiting or any edema.      Allergies  Allergen Reactions   Macrobid [Nitrofurantoin Macrocrystal] Hives    Immunization History  Administered Date(s) Administered   Influenza,inj,Quad PF,6+ Mos 11/05/2013, 10/08/2014, 11/04/2016, 11/23/2017   Influenza-Unspecified 11/23/2017   PFIZER(Purple Top)SARS-COV-2 Vaccination 04/07/2019, 04/28/2019, 01/03/2020   Tdap 03/04/2012, 08/05/2013    Past Medical History:  Diagnosis Date   Abnormal Pap smear    ASC-H   Chlamydia    History of chicken pox    HSV infection    Multiple sclerosis ([place])    Neuromuscular disorder ([place])    MS   Yeast infection     Tobacco History: Social History   Tobacco Use  Smoking Status Former   Types: Cigarettes   Quit date: 01/23/2000   Years since quitting: 22.1  Smokeless Tobacco Not on file   Counseling given: Not Answered   Outpatient Encounter Medications as of 03/30/2022  Medication Sig   aspirin EC 81 MG tablet Take 1 tablet (81 mg total) by mouth daily.   calcium-vitamin D (OSCAL WITH D) 500-200 MG-UNIT tablet Take 2 tablets by mouth.    carboxymethylcellulose (REFRESH PLUS) 0.5 % SOLN 1 drop daily  as needed.   Fingolimod HCl 0.5 MG CAPS TAKE ONE CAPSULE BY MOUTH ONCE DAILY. MAY TAKE WITH OR WITHOUT FOOD. STORE AT ROOM TEMPERATURE.   Multiple Vitamin (MULTIVITAMIN) tablet Take 1 tablet by mouth daily.   omega-3 acid ethyl esters (LOVAZA) 1 g capsule Take 1 capsule (1 g total) by mouth daily.   Probiotic Product (PROBIOTIC PO) Take 1 capsule by mouth daily at 6 (six) AM.   rosuvastatin (CRESTOR) 10 MG tablet TAKE 1 TABLET(10 MG) BY MOUTH AT BEDTIME   valACYclovir (VALTREX) 500 MG tablet Take 1 tablet ('500mg'$ ) daily PO   Wheat Dextrin (BENEFIBER DRINK MIX PO) Take by mouth. 2 tsp TID   Boswellia-Glucosamine-Vit D (OSTEO BI-FLEX ONE PER DAY PO) Take 2 tablets by mouth daily at 6 (six) AM.   meloxicam (MOBIC) 15 MG tablet Take 1 tablet (15 mg total) by mouth daily. (Patient not taking: Reported on 03/30/2022)   No facility-administered encounter medications on file as of 03/30/2022.     Review of Systems  Review of Systems  Constitutional: Negative.   HENT: Negative.    Cardiovascular: Negative.   Gastrointestinal: Negative.   Allergic/Immunologic: Negative.   Neurological: Negative.   Psychiatric/Behavioral: Negative.         Physical Exam  BP 128/64   Pulse 73   Temp (!) 97.2 F (36.2 C)   Ht '5\' 4"'$  (1.626 m)   Wt 145 lb 9.6 oz (  66 kg)   SpO2 98%   BMI 24.99 kg/m   Wt Readings from Last 5 Encounters:  03/30/22 145 lb 9.6 oz (66 kg)  03/14/22 143 lb (64.9 kg)  10/05/21 143 lb (64.9 kg)  09/29/21 143 lb 4 oz (65 kg)  08/17/21 145 lb 8 oz (66 kg)     Physical Exam Vitals and nursing note reviewed.  Constitutional:      General: She is not in acute distress.    Appearance: She is well-developed.  Cardiovascular:     Rate and Rhythm: Normal rate and regular rhythm.  Pulmonary:     Effort: Pulmonary effort is normal.     Breath sounds: Normal breath sounds.  Neurological:     Mental Status: She is alert and oriented to person, place, and time.      Lab  Results:  CBC    Component Value Date/Time   WBC 3.5 03/14/2022 1122   WBC 2.5 (L) 02/01/2014 0957   RBC 4.10 03/14/2022 1122   RBC 4.16 02/01/2014 0957   HGB 13.2 03/14/2022 1122   HCT 40.3 03/14/2022 1122   PLT 256 03/14/2022 1122   MCV 98 (H) 03/14/2022 1122   MCH 32.2 03/14/2022 1122   MCH 32.2 02/01/2014 0957   MCHC 32.8 03/14/2022 1122   MCHC 33.7 02/01/2014 0957   RDW 12.4 03/14/2022 1122   LYMPHSABS 0.8 03/14/2022 1122   MONOABS 0.4 02/01/2014 0957   EOSABS 0.0 03/14/2022 1122   BASOSABS 0.0 03/14/2022 1122    BMET    Component Value Date/Time   NA 143 03/14/2022 1122   K 4.2 03/14/2022 1122   CL 106 03/14/2022 1122   CO2 23 03/14/2022 1122   GLUCOSE 81 03/14/2022 1122   GLUCOSE 80 02/17/2015 1353   BUN 12 03/14/2022 1122   CREATININE 0.70 03/14/2022 1122   CREATININE 0.77 02/17/2015 1353   CALCIUM 9.3 03/14/2022 1122   GFRNONAA 93 01/19/2020 1039   GFRNONAA >89 02/17/2015 1353   GFRAA 107 01/19/2020 1039   GFRAA >89 02/17/2015 1353     Assessment & Plan:   Hyperlipidemia - Lipid Panel   Follow up:  Follow up in 6 months     Fenton Foy, NP 03/30/2022

## 2022-03-31 LAB — LIPID PANEL
Chol/HDL Ratio: 2.3 ratio (ref 0.0–4.4)
Cholesterol, Total: 160 mg/dL (ref 100–199)
HDL: 70 mg/dL (ref >39)
LDL Chol Calc (NIH): 71 mg/dL (ref 0–99)
Triglycerides: 110 mg/dL (ref 0–149)
VLDL Cholesterol Cal: 19 mg/dL (ref 5–40)

## 2022-04-02 NOTE — Progress Notes (Signed)
Called pt and inform message. [employer]

## 2022-04-06 ENCOUNTER — Other Ambulatory Visit (HOSPITAL_COMMUNITY): Payer: Self-pay

## 2022-04-07 ENCOUNTER — Other Ambulatory Visit (HOSPITAL_COMMUNITY): Payer: Self-pay

## 2022-04-10 ENCOUNTER — Other Ambulatory Visit (HOSPITAL_COMMUNITY): Payer: Self-pay

## 2022-04-10 ENCOUNTER — Other Ambulatory Visit: Payer: Self-pay

## 2022-04-13 ENCOUNTER — Encounter: Payer: Self-pay | Admitting: Obstetrics

## 2022-04-13 ENCOUNTER — Other Ambulatory Visit (HOSPITAL_COMMUNITY)
Admission: RE | Admit: 2022-04-13 | Discharge: 2022-04-13 | Disposition: A | Discharge status: Home / Self Care | Payer: BC Managed Care – PPO | Source: Ambulatory Visit | Attending: Obstetrics | Admitting: Obstetrics

## 2022-04-13 ENCOUNTER — Ambulatory Visit (INDEPENDENT_AMBULATORY_CARE_PROVIDER_SITE_OTHER): Payer: BC Managed Care – PPO | Admitting: Obstetrics

## 2022-04-13 VITALS — BP 122/75 | HR 63 | Ht 64.0 in | Wt 146.0 lb

## 2022-04-13 DIAGNOSIS — Z01419 Encounter for gynecological examination (general) (routine) without abnormal findings: Secondary | ICD-10-CM | POA: Diagnosis not present

## 2022-04-13 DIAGNOSIS — N898 Other specified noninflammatory disorders of vagina: Secondary | ICD-10-CM | POA: Insufficient documentation

## 2022-04-13 DIAGNOSIS — Z1339 Encounter for screening examination for other mental health and behavioral disorders: Secondary | ICD-10-CM | POA: Diagnosis not present

## 2022-04-13 DIAGNOSIS — Z780 Asymptomatic menopausal state: Secondary | ICD-10-CM

## 2022-04-13 DIAGNOSIS — Z113 Encounter for screening for infections with a predominantly sexual mode of transmission: Secondary | ICD-10-CM | POA: Diagnosis not present

## 2022-04-13 NOTE — Progress Notes (Signed)
Subjective:        Kelly Grant is a 57 y.o. female here for a routine exam.  Current complaints: Vaginal discharge .    Personal health questionnaire:  Is [name], have a family history of breast and/or ovarian cancer: no Is there a family history of uterine cancer diagnosed at age < 59, gastrointestinal cancer, urinary tract cancer, family member who is a Field seismologist syndrome-associated carrier: no Is the patient overweight and hypertensive, family history of diabetes, personal history of gestational diabetes, preeclampsia or PCOS: no Is patient over 60, have PCOS,  family history of premature CHD under age 32, diabetes, smoke, have hypertension or peripheral artery disease:  no At any time, has a partner hit, kicked or otherwise hurt or frightened you?: no Over the past 2 weeks, have you felt down, depressed or hopeless?: no Over the past 2 weeks, have you felt little interest or pleasure in doing things?:no   Gynecologic History No LMP recorded. Patient is postmenopausal. Contraception: post menopausal status Last Pap: 2023. Results were: normal Last mammogram: 2023. Results were: normal  Obstetric History OB History  Gravida Para Term Preterm AB Living  3 2     1 2   SAB IAB Ectopic Multiple Live Births    1     2    # Outcome Date GA Lbr Len/2nd Weight Sex Delivery Anes PTL Lv  3 Para           2 Para           1 IAB             Past Medical History:  Diagnosis Date   Abnormal Pap smear    ASC-H   Chlamydia    History of chicken pox    HSV infection    Multiple sclerosis ([place])    Neuromuscular disorder ([place])    MS   Yeast infection     Past Surgical History:  Procedure Laterality Date   CARPAL TUNNEL RELEASE Left    in addition to wrist fracture surgery   CESAREAN SECTION     x2   COLONOSCOPY  2016   DJ-MAC-movi(exc)-TA   INCISION AND DRAINAGE ABSCESS Left    left buttock   POLYPECTOMY  2016   TA   TUBAL LIGATION       Current  Outpatient Medications:    aspirin EC 81 MG tablet, Take 1 tablet (81 mg total) by mouth daily., Disp: 30 tablet, Rfl: 11   Boswellia-Glucosamine-Vit D (OSTEO BI-FLEX ONE PER DAY PO), Take 2 tablets by mouth daily at 6 (six) AM., Disp: , Rfl:    calcium-vitamin D (OSCAL WITH D) 500-200 MG-UNIT tablet, Take 2 tablets by mouth. , Disp: , Rfl:    carboxymethylcellulose (REFRESH PLUS) 0.5 % SOLN, 1 drop daily as needed., Disp: , Rfl:    Fingolimod HCl 0.5 MG CAPS, TAKE ONE CAPSULE BY MOUTH ONCE DAILY. MAY TAKE WITH OR WITHOUT FOOD. STORE AT ROOM TEMPERATURE., Disp: 90 capsule, Rfl: 3   meloxicam (MOBIC) 15 MG tablet, Take 1 tablet (15 mg total) by mouth daily. (Patient not taking: Reported on 03/30/2022), Disp: 30 tablet, Rfl: 1   Multiple Vitamin (MULTIVITAMIN) tablet, Take 1 tablet by mouth daily., Disp: 30 tablet, Rfl: 3   omega-3 acid ethyl esters (LOVAZA) 1 g capsule, Take 1 capsule (1 g total) by mouth daily., Disp: 90 capsule, Rfl: 3   Probiotic Product (PROBIOTIC PO), Take 1 capsule by mouth daily at  6 (six) AM., Disp: , Rfl:    rosuvastatin (CRESTOR) 10 MG tablet, TAKE 1 TABLET(10 MG) BY MOUTH AT BEDTIME, Disp: 90 tablet, Rfl: 3   valACYclovir (VALTREX) 500 MG tablet, Take 1 tablet (500mg ) daily PO, Disp: 90 tablet, Rfl: 0   Wheat Dextrin (BENEFIBER DRINK MIX PO), Take by mouth. 2 tsp TID, Disp: , Rfl:  Allergies  Allergen Reactions   Macrobid [Nitrofurantoin Macrocrystal] Hives    Social History   Tobacco Use   Smoking status: Former    Types: Cigarettes    Quit date: 01/23/2000    Years since quitting: 22.2   Smokeless tobacco: Never  Substance Use Topics   Alcohol use: Yes    Comment: rarely    Family History  Problem Relation Age of Onset   Colon polyps Mother    Kidney failure Mother    Diabetes Mother    Colon cancer Neg Hx    Esophageal cancer Neg Hx    Rectal cancer Neg Hx    Stomach cancer Neg Hx       Review of Systems  Constitutional: negative for fatigue and  weight loss Respiratory: negative for cough and wheezing Cardiovascular: negative for chest pain, fatigue and palpitations Gastrointestinal: negative for abdominal pain and change in bowel habits Musculoskeletal:negative for myalgias Neurological: negative for gait problems and tremors Behavioral/Psych: negative for abusive relationship, depression Endocrine: negative for temperature intolerance    Genitourinary:negative for abnormal menstrual periods, genital lesions, hot flashes, sexual problems and vaginal discharge Integument/breast: negative for breast lump, breast tenderness, nipple discharge and skin lesion(s)    Objective:       BP 122/75   Pulse 63   Ht 5\' 4"  (1.626 m)   Wt 146 lb (66.2 kg)   BMI 25.06 kg/m  General:   alert  Skin:   no rash or abnormalities  Lungs:   clear to auscultation bilaterally  Heart:   regular rate and rhythm, S1, S2 normal, no murmur, click, rub or gallop  Breasts:   normal without suspicious masses, skin or nipple changes or axillary nodes  Abdomen:  normal findings: no organomegaly, soft, non-tender and no hernia  Pelvis:  External genitalia: normal general appearance Urinary system: urethral meatus normal and bladder without fullness, nontender Vaginal: normal without tenderness, induration or masses Cervix: normal appearance Adnexa: normal bimanual exam Uterus: anteverted and non-tender, normal size   Lab Review Urine pregnancy test Labs reviewed yes Radiologic studies reviewed yes  I have spent a total of 20 minutes of face-to-face time, excluding clinical staff time, reviewing notes and preparing to see patient, ordering tests and/or medications, and counseling the patient.   Assessment:    1. Encounter for gynecological examination with Papanicolaou smear of cervix Rx: - Cytology - PAP( [place])  2. Postmenopause - doing well  3. Vaginal discharge Rx: - Cervicovaginal ancillary only( [place])  4. Screening for  STD (sexually transmitted disease) Rx: - Hepatitis C Antibody - Hepatitis B Surface AntiGEN - HIV antibody (with reflex) - RPR     Plan:    Education reviewed: calcium supplements, depression evaluation, low fat, low cholesterol diet, safe sex/STD prevention, self breast exams, skin cancer screening, and weight bearing exercise. Follow up in: 1 year.    Orders Placed This Encounter  Procedures   Hepatitis C Antibody   Hepatitis B Surface AntiGEN   HIV antibody (with reflex)   RPR    Shelly Bombard, MD 04/13/2022 10:37 AM

## 2022-04-13 NOTE — Progress Notes (Signed)
57 y.o. GYN presents for AEX/PAP/STD screening.  Last Mammogram 10/2021.  Reports no concerns today.

## 2022-04-14 LAB — HEPATITIS C ANTIBODY: Hep C Virus Ab: NONREACTIVE

## 2022-04-14 LAB — HEPATITIS B SURFACE ANTIGEN: Hepatitis B Surface Ag: NEGATIVE

## 2022-04-14 LAB — HIV ANTIBODY (ROUTINE TESTING W REFLEX): HIV Screen 4th Generation wRfx: NONREACTIVE

## 2022-04-14 LAB — RPR: RPR Ser Ql: NONREACTIVE

## 2022-04-16 LAB — CERVICOVAGINAL ANCILLARY ONLY
Bacterial Vaginitis (gardnerella): NEGATIVE
Candida Glabrata: NEGATIVE
Candida Vaginitis: NEGATIVE
Chlamydia: NEGATIVE
Comment: NEGATIVE
Comment: NEGATIVE
Comment: NEGATIVE
Comment: NEGATIVE
Comment: NEGATIVE
Comment: NORMAL
Neisseria Gonorrhea: NEGATIVE
Trichomonas: NEGATIVE

## 2022-04-18 ENCOUNTER — Other Ambulatory Visit (HOSPITAL_COMMUNITY): Payer: Self-pay

## 2022-04-20 LAB — CYTOLOGY - PAP
Comment: NEGATIVE
Comment: NEGATIVE
Comment: NEGATIVE
Diagnosis: UNDETERMINED — AB
HPV 16: NEGATIVE
HPV 18 / 45: NEGATIVE
High risk HPV: POSITIVE — AB

## 2022-05-01 ENCOUNTER — Other Ambulatory Visit (HOSPITAL_COMMUNITY): Payer: Self-pay

## 2022-05-04 ENCOUNTER — Other Ambulatory Visit (HOSPITAL_COMMUNITY): Payer: Self-pay

## 2022-05-07 ENCOUNTER — Encounter: Payer: Self-pay | Admitting: Obstetrics and Gynecology

## 2022-05-07 ENCOUNTER — Other Ambulatory Visit (HOSPITAL_COMMUNITY)
Admission: RE | Admit: 2022-05-07 | Discharge: 2022-05-07 | Disposition: A | Discharge status: Home / Self Care | Payer: BC Managed Care – PPO | Source: Ambulatory Visit | Attending: Obstetrics and Gynecology | Admitting: Obstetrics and Gynecology

## 2022-05-07 ENCOUNTER — Ambulatory Visit (INDEPENDENT_AMBULATORY_CARE_PROVIDER_SITE_OTHER): Payer: BC Managed Care – PPO | Admitting: Obstetrics and Gynecology

## 2022-05-07 VITALS — BP 126/83 | HR 70 | Ht 64.0 in | Wt 147.0 lb

## 2022-05-07 DIAGNOSIS — R87610 Atypical squamous cells of undetermined significance on cytologic smear of cervix (ASC-US): Secondary | ICD-10-CM | POA: Diagnosis not present

## 2022-05-07 DIAGNOSIS — N870 Mild cervical dysplasia: Secondary | ICD-10-CM | POA: Diagnosis not present

## 2022-05-07 DIAGNOSIS — R87810 Cervical high risk human papillomavirus (HPV) DNA test positive: Secondary | ICD-10-CM

## 2022-05-07 NOTE — Progress Notes (Signed)
57 y.o GYN presents for COLPO for +high risk HPV and ASCUS.

## 2022-05-07 NOTE — Progress Notes (Signed)
    GYNECOLOGY CLINIC COLPOSCOPY PROCEDURE NOTE  57 y.o. [id number] here for colposcopy for ASCUS with POSITIVE high risk HPV pap smear on 3/24. Discussed role for HPV in cervical dysplasia, need for surveillance. Pt has a H/O abnormal ppa smear in the past with colpo. Can't remember when and if any type of Tx.  Patient given informed consent, signed copy in the chart, time out was performed.  Placed in lithotomy position. Cervix viewed with speculum and colposcope after application of acetic acid.   Colposcopy adequate? No Unable to see TZ, cervical Oz stenotic  acetowhite lesion(s) noted at 12 & 6 o'clock; corresponding biopsies obtained.  ECC specimen not obtained due to stenotic Oz All specimens were labelled and sent to pathology.  Patient was given post procedure instructions.  Will follow up pathology and manage accordingly.  Routine preventative health maintenance measures emphasized.    Nettie Elm MD, FACOG Attending Obstetrician & Gynecologist Center for [facility], [facility]

## 2022-05-08 LAB — SURGICAL PATHOLOGY

## 2022-05-15 ENCOUNTER — Other Ambulatory Visit (HOSPITAL_COMMUNITY): Payer: Self-pay

## 2022-05-18 ENCOUNTER — Other Ambulatory Visit (HOSPITAL_COMMUNITY): Payer: Self-pay

## 2022-05-21 ENCOUNTER — Other Ambulatory Visit: Payer: Self-pay

## 2022-05-21 ENCOUNTER — Other Ambulatory Visit (HOSPITAL_COMMUNITY): Payer: Self-pay

## 2022-05-22 ENCOUNTER — Other Ambulatory Visit: Payer: Self-pay

## 2022-05-22 DIAGNOSIS — B009 Herpesviral infection, unspecified: Secondary | ICD-10-CM

## 2022-05-22 NOTE — Telephone Encounter (Signed)
Please advise [facility] 

## 2022-05-23 MED ORDER — VALACYCLOVIR HCL 500 MG PO TABS: ORAL_TABLET | ORAL | 0 refills | Status: DC

## 2022-06-06 ENCOUNTER — Other Ambulatory Visit: Payer: Self-pay

## 2022-06-06 ENCOUNTER — Other Ambulatory Visit (HOSPITAL_COMMUNITY): Payer: Self-pay

## 2022-06-20 ENCOUNTER — Other Ambulatory Visit (HOSPITAL_COMMUNITY): Payer: Self-pay

## 2022-06-26 ENCOUNTER — Other Ambulatory Visit (HOSPITAL_COMMUNITY): Payer: Self-pay

## 2022-06-29 ENCOUNTER — Other Ambulatory Visit (HOSPITAL_COMMUNITY): Payer: Self-pay

## 2022-07-02 ENCOUNTER — Encounter (HOSPITAL_COMMUNITY): Payer: Self-pay

## 2022-07-02 ENCOUNTER — Other Ambulatory Visit (HOSPITAL_COMMUNITY): Payer: Self-pay

## 2022-07-25 ENCOUNTER — Other Ambulatory Visit (HOSPITAL_COMMUNITY): Payer: Self-pay

## 2022-07-27 ENCOUNTER — Other Ambulatory Visit (HOSPITAL_COMMUNITY): Payer: Self-pay

## 2022-07-30 ENCOUNTER — Telehealth: Payer: Self-pay

## 2022-07-30 NOTE — Telephone Encounter (Signed)
Chart review completed for patient. Patient is due for screening mammogram. Mychart message sent to patient to inquire about scheduling mammogram.  Dezyrae Kensinger, Population Health Specialist.  

## 2022-08-01 ENCOUNTER — Other Ambulatory Visit (HOSPITAL_COMMUNITY): Payer: Self-pay

## 2022-08-02 ENCOUNTER — Other Ambulatory Visit (HOSPITAL_COMMUNITY): Payer: Self-pay

## 2022-08-02 ENCOUNTER — Other Ambulatory Visit: Payer: Self-pay

## 2022-08-08 DIAGNOSIS — H2513 Age-related nuclear cataract, bilateral: Secondary | ICD-10-CM | POA: Diagnosis not present

## 2022-08-08 DIAGNOSIS — H04123 Dry eye syndrome of bilateral lacrimal glands: Secondary | ICD-10-CM | POA: Diagnosis not present

## 2022-08-08 DIAGNOSIS — G35 Multiple sclerosis: Secondary | ICD-10-CM | POA: Diagnosis not present

## 2022-08-26 ENCOUNTER — Encounter (HOSPITAL_COMMUNITY): Payer: Self-pay | Admitting: Emergency Medicine

## 2022-08-26 ENCOUNTER — Other Ambulatory Visit: Payer: Self-pay

## 2022-08-26 ENCOUNTER — Ambulatory Visit (HOSPITAL_COMMUNITY)
Admission: EM | Admit: 2022-08-26 | Discharge: 2022-08-26 | Disposition: A | Discharge status: Home / Self Care | Payer: BC Managed Care – PPO | Attending: Internal Medicine | Admitting: Internal Medicine

## 2022-08-26 DIAGNOSIS — M62838 Other muscle spasm: Secondary | ICD-10-CM

## 2022-08-26 DIAGNOSIS — M25511 Pain in right shoulder: Secondary | ICD-10-CM

## 2022-08-26 MED ORDER — KETOROLAC TROMETHAMINE 30 MG/ML IJ SOLN
30.0000 mg | Freq: Once | INTRAMUSCULAR | Status: AC
  Administered 2022-08-26: 30 mg via INTRAMUSCULAR

## 2022-08-26 MED ORDER — BACLOFEN 10 MG PO TABS: 10.0000 mg | ORAL_TABLET | Freq: Three times a day (TID) | ORAL | 0 refills | Status: DC

## 2022-08-26 MED ORDER — KETOROLAC TROMETHAMINE 30 MG/ML IJ SOLN
INTRAMUSCULAR | Status: AC
  Filled 2022-08-26: qty 1

## 2022-08-26 MED ORDER — PREDNISONE 20 MG PO TABS: 40.0000 mg | ORAL_TABLET | Freq: Every day | ORAL | 0 refills | Status: AC

## 2022-08-26 NOTE — ED Triage Notes (Signed)
Pt c/o right shoulder pain due to repetitive task at work, denies any fall or injury.

## 2022-08-26 NOTE — Discharge Instructions (Addendum)
Your pain is likely due to a muscle strain which will improve on its own with time.   -Take prednisone 40 mg once daily for the next 5 days starting tomorrow. - Gave you a shot of ketorolac in the clinic today.  This is a strong anti-inflammatory medication.  Do not take any ibuprofen for 24 hours since I gave you this medicine. - You may also take the prescribed muscle relaxer as directed as needed for muscle aches/spasm.  Do not take this medication and drive or drink alcohol as it can make you sleepy.  Mainly use this medicine at nighttime as needed. - Apply heat 20 minutes on then 20 minutes off and perform gentle range of motion exercises to the area of greatest pain to prevent muscle stiffness and provide further pain relief.   Please follow-up with [facility] in the next 3 to 5 days.  If you develop any new or worsening symptoms or if your symptoms do not start to improve, please return here or follow-up with your primary care provider. If your symptoms are severe, please go to the emergency room.

## 2022-08-26 NOTE — ED Provider Notes (Signed)
[facility]    CSN: [id number] Arrival date & time: 08/26/22  1659      History   Chief Complaint Chief Complaint  Patient presents with   Shoulder Pain    HPI Kelly Grant is a 57 y.o. female.   Patient presents to urgent care for evaluation of right shoulder pain and pain to the anterior right upper chest/neck that started overnight last night. She works at [employer] and uses arms frequently to lift heavy objects and perform tasks. 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 She believes she "over did it" and is now having significant pain to the right shoulder. Pain does not radiate anywhere and is significantly worse with movement of the right upper extremity. No midline neck pain, abdominal pain, paresthesias, limb weakness, headache, fever/chills, rash, or dizziness. Has attempted use of tylenol PTA without relief. History of MS, no recent steroid use.    Shoulder Pain   Past Medical History:  Diagnosis Date   Abnormal Pap smear    ASC-H   Chlamydia    History of chicken pox    HSV infection    Multiple sclerosis (HCC)    Neuromuscular disorder (HCC)    MS   Yeast infection     Patient Active Problem List   Diagnosis Date Noted   ASCUS with positive high risk HPV cervical 05/07/2022   Hyperlipidemia 09/29/2021   Encounter for therapeutic drug monitoring 12/13/2014   Multiple sclerosis (HCC) 05/08/2012   HSV infection 06/22/2011    Past Surgical History:  Procedure Laterality Date   CARPAL TUNNEL RELEASE Left    in addition to wrist fracture surgery   CESAREAN SECTION     x2   COLONOSCOPY  2016   DJ-MAC-movi(exc)-TA   INCISION AND DRAINAGE ABSCESS Left    left buttock   POLYPECTOMY  2016   TA   TUBAL LIGATION      OB History     Gravida  3   Para  2   Term      Preterm      AB  1   Living  2      SAB      IAB  1   Ectopic      Multiple       Live Births  2            Home Medications    Prior to Admission medications   Medication Sig Start Date End Date Taking? Authorizing Provider  baclofen (LIORESAL) 10 MG tablet Take 1 tablet (10 mg total) by mouth 3 (three) times daily. 08/26/22  Yes Carlisle Beers, FNP  predniSONE (DELTASONE) 20 MG tablet Take 2 tablets (40 mg total) by mouth daily for 5 days. 08/26/22 08/31/22 Yes Carlisle Beers, FNP  aspirin EC 81 MG tablet Take 1 tablet (81 mg total) by mouth daily. 02/15/17   Massie Maroon, FNP  Boswellia-Glucosamine-Vit D (OSTEO BI-FLEX ONE PER DAY PO) Take 2 tablets by mouth daily at 6 (six) AM.    [provider]  calcium-vitamin D (OSCAL WITH D) 500-200 MG-UNIT tablet Take 2 tablets by mouth.     [provider]  carboxymethylcellulose (REFRESH PLUS) 0.5 % SOLN 1 drop daily as needed.    [provider]  Fingolimod HCl 0.5 MG CAPS TAKE ONE CAPSULE BY MOUTH ONCE DAILY. MAY TAKE WITH OR WITHOUT FOOD. STORE AT  ROOM TEMPERATURE. 03/14/22   Glean Salvo, NP  meloxicam (MOBIC) 15 MG tablet Take 1 tablet (15 mg total) by mouth daily. Patient not taking: Reported on 03/30/2022 10/05/21   Madelyn Brunner, DO  Multiple Vitamin (MULTIVITAMIN) tablet Take 1 tablet by mouth daily. 04/29/15   Massie Maroon, FNP  omega-3 acid ethyl esters (LOVAZA) 1 g capsule Take 1 capsule (1 g total) by mouth daily. 02/17/20   Barbette Merino, NP  Probiotic Product (PROBIOTIC PO) Take 1 capsule by mouth daily at 6 (six) AM.    [provider]  rosuvastatin (CRESTOR) 10 MG tablet TAKE 1 TABLET(10 MG) BY MOUTH AT BEDTIME 05/23/21   Orion Crook I, NP  valACYclovir (VALTREX) 500 MG tablet Take 1 tablet (500mg ) daily PO 05/23/22   Ivonne Andrew, NP  Wheat Dextrin (BENEFIBER DRINK MIX PO) Take by mouth. 2 tsp TID    [provider]    Family History Family History  Problem Relation Age of Onset   Colon polyps Mother    Kidney failure Mother     Diabetes Mother    Colon cancer Neg Hx    Esophageal cancer Neg Hx    Rectal cancer Neg Hx    Stomach cancer Neg Hx     Social History Social History   Tobacco Use   Smoking status: Former    Current packs/day: 0.00    Types: Cigarettes    Quit date: 01/23/2000    Years since quitting: 22.6   Smokeless tobacco: Never  Vaping Use   Vaping status: Never Used  Substance Use Topics   Alcohol use: Yes    Comment: rarely   Drug use: No     Allergies   Macrobid [nitrofurantoin macrocrystal]   Review of Systems Review of Systems Per HPI  Physical Exam Triage Vital Signs ED Triage Vitals  Encounter Vitals Group     BP 08/26/22 1756 (!) 140/86     Systolic BP Percentile --      Diastolic BP Percentile --      Pulse Rate 08/26/22 1756 91     Resp 08/26/22 1756 18     Temp 08/26/22 1756 98.4 F (36.9 C)     Temp Source 08/26/22 1756 Oral     SpO2 08/26/22 1756 96 %     Weight 08/26/22 1757 147 lb 11.3 oz (67 kg)     Height 08/26/22 1757 5\' 4"  (1.626 m)     Head Circumference --      Peak Flow --      Pain Score 08/26/22 1756 9     Pain Loc --      Pain Education --      Exclude from Growth Chart --    No data found.  Updated Vital Signs BP (!) 140/86 (BP Location: Right Arm)   Pulse 91   Temp 98.4 F (36.9 C) (Oral)   Resp 18   Ht 5\' 4"  (1.626 m)   Wt 147 lb 11.3 oz (67 kg)   SpO2 96%   BMI 25.35 kg/m   Visual Acuity Right Eye Distance:   Left Eye Distance:   Bilateral Distance:    Right Eye Near:   Left Eye Near:    Bilateral Near:     Physical Exam Vitals and nursing note reviewed.  Constitutional:      Appearance: She is not ill-appearing or toxic-appearing.  HENT:     Head: Normocephalic and atraumatic.  Right Ear: Hearing and external ear normal.     Left Ear: Hearing and external ear normal.     Nose: Nose normal.     Mouth/Throat:     Lips: Pink.  Eyes:     General: Lids are normal. Vision grossly intact. Gaze aligned  appropriately.     Extraocular Movements: Extraocular movements intact.     Conjunctiva/sclera: Conjunctivae normal.  Cardiovascular:     Rate and Rhythm: Normal rate and regular rhythm.     Heart sounds: Normal heart sounds, S1 normal and S2 normal.  Pulmonary:     Effort: Pulmonary effort is normal. No respiratory distress.     Breath sounds: Normal breath sounds and air entry.  Musculoskeletal:     Right shoulder: Tenderness (Tender to palpation to the diffiuse right shoulder joint) present. No swelling, deformity, effusion, laceration, bony tenderness or crepitus. Decreased range of motion (secondary to pain and muscle spasm of the right neck). Normal strength (5/5 grip strength to bilateral upper extremities, sensation intact distally, brisk cap refill of distal RUE). Abnormal pulse (+2 right brachial and right radial).     Left shoulder: Normal.     Cervical back: Neck supple. Pain with movement (Pain with head movement to the left and right) and muscular tenderness (Tender to multiple points of palpation over the right cervical paraspinals and right trapezius muscle) present. No spinous process tenderness. Decreased range of motion (secondary to pain of the right shoulder).  Lymphadenopathy:     Cervical: No cervical adenopathy.  Skin:    General: Skin is warm and dry.     Capillary Refill: Capillary refill takes less than 2 seconds.     Findings: No rash.  Neurological:     General: No focal deficit present.     Mental Status: She is alert and oriented to person, place, and time. Mental status is at baseline.     Cranial Nerves: No dysarthria or facial asymmetry.  Psychiatric:        Mood and Affect: Mood normal.        Speech: Speech normal.        Behavior: Behavior normal.        Thought Content: Thought content normal.        Judgment: Judgment normal.      UC Treatments / Results  Labs (all labs ordered are listed, but only abnormal results are displayed) Labs  Reviewed - No data to display  EKG   Radiology No results found.  Procedures Procedures (including critical care time)  Medications Ordered in UC Medications  ketorolac (TORADOL) 30 MG/ML injection 30 mg (30 mg Intramuscular Given 08/26/22 1837)    Initial Impression / Assessment and Plan / UC Course  I have reviewed the triage vital signs and the nursing notes.  Pertinent labs & imaging results that were available during my care of the patient were reviewed by me and considered in my medical decision making (see chart for details).   Acute pain of right shoulder, muscle spasm of right shoulder region Evaluation suggests pain is muscular in nature secondary to overuse injury. Will manage this with rest, gentle ROM exercises, heat therapy, tylenol as needed for pain, and as needed use of muscle relaxer. Drowsiness precautions discussed regarding muscle relaxer use. Symptoms have not responded well to tylenol and patient with significant decreased ROM secondary to muscle spasm and inflammation, therefore will use prednisone burst 40mg  every day for 5 days.  Ketorolac 30mg  IM given in  clinic after review of recent lab work showing normal renal function (no NSAIDs for 24 hours).  Imaging: no indication for imaging based on stable musculoskeletal exam findings, atraumatic mechanism of injury, and low suspicion for acute bony abnormality May follow-up with orthopedics as needed. Work excuse note given.  Rest advised.  Counseled patient on potential for adverse effects with medications prescribed/recommended today, strict ER and return-to-clinic precautions discussed, patient verbalized understanding.    Final Clinical Impressions(s) / UC Diagnoses   Final diagnoses:  Acute pain of right shoulder  Muscle spasm of shoulder region     Discharge Instructions      Your pain is likely due to a muscle strain which will improve on its own with time.   -Take prednisone 40 mg once daily  for the next 5 days starting tomorrow. - Gave you a shot of ketorolac in the clinic today.  This is a strong anti-inflammatory medication.  Do not take any ibuprofen for 24 hours since I gave you this medicine. - You may also take the prescribed muscle relaxer as directed as needed for muscle aches/spasm.  Do not take this medication and drive or drink alcohol as it can make you sleepy.  Mainly use this medicine at nighttime as needed. - Apply heat 20 minutes on then 20 minutes off and perform gentle range of motion exercises to the area of greatest pain to prevent muscle stiffness and provide further pain relief.   Please follow-up with [facility] in the next 3 to 5 days.  If you develop any new or worsening symptoms or if your symptoms do not start to improve, please return here or follow-up with your primary care provider. If your symptoms are severe, please go to the emergency room.      ED Prescriptions     Medication Sig Dispense Auth. Provider   predniSONE (DELTASONE) 20 MG tablet Take 2 tablets (40 mg total) by mouth daily for 5 days. 10 tablet Carlisle Beers, FNP   baclofen (LIORESAL) 10 MG tablet Take 1 tablet (10 mg total) by mouth 3 (three) times daily. 30 each Carlisle Beers, FNP      PDMP not reviewed this encounter.   Carlisle Beers, [place] 08/28/22 2051

## 2022-08-27 DIAGNOSIS — M25511 Pain in right shoulder: Secondary | ICD-10-CM | POA: Diagnosis not present

## 2022-08-29 ENCOUNTER — Other Ambulatory Visit (HOSPITAL_COMMUNITY): Payer: Self-pay

## 2022-08-30 ENCOUNTER — Other Ambulatory Visit (HOSPITAL_COMMUNITY): Payer: Self-pay

## 2022-09-01 DIAGNOSIS — M25511 Pain in right shoulder: Secondary | ICD-10-CM | POA: Diagnosis not present

## 2022-09-10 DIAGNOSIS — M25511 Pain in right shoulder: Secondary | ICD-10-CM | POA: Diagnosis not present

## 2022-09-11 ENCOUNTER — Telehealth: Payer: Self-pay | Admitting: Pharmacy Technician

## 2022-09-11 ENCOUNTER — Other Ambulatory Visit (HOSPITAL_COMMUNITY): Payer: Self-pay

## 2022-09-11 ENCOUNTER — Other Ambulatory Visit: Payer: Self-pay

## 2022-09-11 NOTE — Telephone Encounter (Signed)
 Clinical Questions have been submitted

## 2022-09-11 NOTE — Telephone Encounter (Signed)
Pharmacy Patient Advocate Encounter   Received notification from Patient Pharmacy that prior authorization for Fingolimod HCl 0.5MG  capsules is required/requested.   Insurance verification completed.   The patient is insured through [facility] .   Per test claim: PA required; PA started via CoverMyMeds. KEY [id number] . Waiting for clinical questions to populate.

## 2022-09-12 ENCOUNTER — Encounter: Payer: Self-pay | Admitting: Neurology

## 2022-09-12 ENCOUNTER — Ambulatory Visit (INDEPENDENT_AMBULATORY_CARE_PROVIDER_SITE_OTHER): Payer: BC Managed Care – PPO | Admitting: Neurology

## 2022-09-12 ENCOUNTER — Other Ambulatory Visit: Payer: Self-pay

## 2022-09-12 ENCOUNTER — Other Ambulatory Visit (HOSPITAL_COMMUNITY): Payer: Self-pay

## 2022-09-12 VITALS — BP 147/92 | HR 77 | Ht 64.0 in | Wt 149.5 lb

## 2022-09-12 DIAGNOSIS — G35 Multiple sclerosis: Secondary | ICD-10-CM

## 2022-09-12 MED ORDER — FINGOLIMOD HCL 0.5 MG PO CAPS
ORAL_CAPSULE | ORAL | 3 refills | Status: DC
  Filled 2022-09-12: qty 30, 30d supply, fill #0
  Filled 2022-10-02: qty 30, 30d supply, fill #1
  Filled 2022-11-22 – 2022-11-26 (×2): qty 30, 30d supply, fill #2
  Filled 2022-12-27 – 2022-12-31 (×3): qty 30, 30d supply, fill #3
  Filled 2023-01-21: qty 30, 30d supply, fill #4
  Filled 2023-02-14: qty 30, 30d supply, fill #5
  Filled 2023-03-20: qty 30, 30d supply, fill #6
  Filled 2023-04-19 (×2): qty 30, 30d supply, fill #7
  Filled 2023-05-21: qty 30, 30d supply, fill #8
  Filled 2023-06-26: qty 30, 30d supply, fill #9
  Filled 2023-07-30: qty 30, 30d supply, fill #10
  Filled 2023-09-04 (×2): qty 30, 30d supply, fill #11

## 2022-09-12 NOTE — Telephone Encounter (Signed)
Pharmacy Patient Advocate Encounter  Received notification from [facility] that Prior Authorization for Fingolimod HCl 0.5MG  capsule  has been APPROVED from 09/12/2022 to 09/11/2023   PA #/Case ID/Reference #: [id number]

## 2022-09-12 NOTE — Patient Instructions (Signed)
Great to see you today! Check labs. Continue the generic Gilenya. Check labs today. Hope your shoulder feels better!! See Dr. Terrace Arabia in 6 months. Thanks!!

## 2022-09-12 NOTE — Progress Notes (Signed)
PATIENT: Kelly Grant DOB: 1965-06-18  REASON FOR VISIT: follow up MS HISTORY FROM: patient Primary Neurologist: Dr. Terrace Arabia   HISTORY OF PRESENT ILLNESS: Today 09/12/22 Currently out of work for almost a month, needs right shoulder replacement, will be workmans comp. MS remains stable, remains on generic Gilenya.  Lives alone, on vitamin D supplement.  No falls.  Gait is steady.  Update 03/14/22 SS: Doing well, remains on Gilenya generic. MS is stable. Vision is fine, wears glasses, gets eye exam annually, arms and legs are fine, B/b are fine no accidents. No falls, balance is good. No health issues.  No issues or concerns to discuss.  Update 08/17/21 SS: Kelly Grant is here today for follow-up.  MRI of the brain in July 2021 showed multiple chronic plaques consistent with MS, the MRI was stable from previous in 2018.  She remains on Gilenya, is on generic. MS is stable. Denies any changes to vision, saw eye doctor, no problems mentioned, does wear glasses. No numbness or weakness noted. Has urinary urgency. No falls. No changes to balance. Continues to work full time.   Update 02/01/2021 SS: Kelly Grant here today for follow-up with history of RRMS on Gilenya.  Continues to do well.  Has no new MS symptoms.  Continues to work full-time, her job is physical, may have weakness of left arm with overuse.  No falls.  Her health has been good.  Here today unaccompanied.  Update 07/19/2020 SS: Kelly Grant is a 57 year old female with history of RRMS on Gilenya.  MRI of the brain in July 2021 was overall stable compared to previous in June 2018.  There were no new lesions.  Remains overall stable.  With overuse, will note weakness to the left arm.  She works for [employer], is physical job. Works 6 PM-3 AM. 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 No falls.  Has annual evaluation with Dr. Dione Booze.  Denies any new problems or concerns.  Feels MS is overall stable.  REVIEW OF SYSTEMS: Out of a  complete 14 system review of symptoms, the patient complains only of the following symptoms, and all other reviewed systems are negative.  See HPI  ALLERGIES: Allergies  Allergen Reactions   Macrobid [Nitrofurantoin Macrocrystal] Hives    HOME MEDICATIONS: Outpatient Medications Prior to Visit  Medication Sig Dispense Refill   aspirin EC 81 MG tablet Take 1 tablet (81 mg total) by mouth daily. 30 tablet 11   baclofen (LIORESAL) 10 MG tablet Take 1 tablet (10 mg total) by mouth 3 (three) times daily. 30 each 0   Boswellia-Glucosamine-Vit D (OSTEO BI-FLEX ONE PER DAY PO) Take 2 tablets by mouth daily at 6 (six) AM.     calcium-vitamin D (OSCAL WITH D) 500-200 MG-UNIT tablet Take 2 tablets by mouth.      carboxymethylcellulose (REFRESH PLUS) 0.5 % SOLN 1 drop daily as needed.     Fingolimod HCl 0.5 MG CAPS TAKE ONE CAPSULE BY MOUTH ONCE DAILY. MAY TAKE WITH OR WITHOUT FOOD. STORE AT ROOM TEMPERATURE. 90 capsule 3   Multiple Vitamin (MULTIVITAMIN) tablet Take 1 tablet by mouth daily. 30 tablet 3   omega-3 acid ethyl esters (LOVAZA) 1 g capsule Take 1 capsule (1 g total) by mouth daily. 90 capsule 3   Probiotic Product (PROBIOTIC PO) Take 1 capsule by mouth daily at 6 (six) AM.     rosuvastatin (CRESTOR) 10 MG tablet TAKE 1 TABLET(10 MG) BY MOUTH AT BEDTIME 90 tablet 3  valACYclovir (VALTREX) 500 MG tablet Take 1 tablet (500mg ) daily PO 90 tablet 0   Wheat Dextrin (BENEFIBER DRINK MIX PO) Take by mouth. 2 tsp TID     meloxicam (MOBIC) 15 MG tablet Take 1 tablet (15 mg total) by mouth daily. (Patient not taking: Reported on 03/30/2022) 30 tablet 1   No facility-administered medications prior to visit.    PAST MEDICAL HISTORY: Past Medical History:  Diagnosis Date   Abnormal Pap smear    ASC-H   Chlamydia    History of chicken pox    HSV infection    Multiple sclerosis (HCC)    Neuromuscular disorder (HCC)    MS   Yeast infection     PAST SURGICAL HISTORY: Past Surgical History:   Procedure Laterality Date   CARPAL TUNNEL RELEASE Left    in addition to wrist fracture surgery   CESAREAN SECTION     x2   COLONOSCOPY  2016   DJ-MAC-movi(exc)-TA   INCISION AND DRAINAGE ABSCESS Left    left buttock   POLYPECTOMY  2016   TA   TUBAL LIGATION      FAMILY HISTORY: Family History  Problem Relation Age of Onset   Colon polyps Mother    Kidney failure Mother    Diabetes Mother    Colon cancer Neg Hx    Esophageal cancer Neg Hx    Rectal cancer Neg Hx    Stomach cancer Neg Hx     SOCIAL HISTORY: Social History   Socioeconomic History   Marital status: Single    Spouse name: Not on file   Number of children: 2   Years of education: College    Highest education level: Bachelor's degree (e.g., BA, AB, BS)  Occupational History   Occupation: HOUSEKEEPING    Employer: [employer]  Tobacco Use   Smoking status: Former    Current packs/day: 0.00    Types: Cigarettes    Quit date: 01/23/2000    Years since quitting: 22.6   Smokeless tobacco: Never  Vaping Use   Vaping status: Never Used  Substance and Sexual Activity   Alcohol use: Yes    Alcohol/week: 1.0 standard drink of alcohol    Types: 1 Standard drinks or equivalent per week    Comment: rarely   Drug use: No   Sexual activity: Not Currently    Partners: Male    Birth control/protection: Surgical    Comment: tubal ligation per pt  Other Topics Concern   Not on file  Social History Narrative   Patient lives at home with children.    Patient has 2 children.    Patient has a college degree.    Patient is single.    Patient is right handed.    Patient is currently employed.    Social Determinants of Health   Financial Resource Strain: Not on file  Food Insecurity: Not on file  Transportation Needs: Not on file  Physical Activity: Not on file  Stress: Not on file  Social Connections: Not on file  Intimate Partner Violence: Not on file   PHYSICAL EXAM  Vitals:   09/12/22 0942  BP:  (!) 147/92  Pulse: 77  Weight: 149 lb 8 oz (67.8 kg)  Height: 5\' 4"  (1.626 m)   Body mass index is 25.66 kg/m.  Generalized: Well developed, in no acute distress   Neurological examination  Mentation: Alert oriented to time, place, history taking. Follows all commands speech and language fluent Cranial nerve II-XII:  Pupils were equal round reactive to light. Extraocular movements were full, visual field were full on confrontational test. Facial sensation and strength were normal. Head turning and shoulder shrug were normal and symmetric. Motor: 5/5 strength all extremities, except weak right shoulder from injury Sensory: Sensory testing is intact to soft touch on all 4 extremities. No evidence of extinction is noted.  Coordination: Cerebellar testing reveals good finger-nose-finger on the left, cannot do with right due to injury; heel-to-shin is normal Gait and station: Gait is normal.  Reflexes: Deep tendon reflexes are symmetric and normal bilaterally.   DIAGNOSTIC DATA (LABS, IMAGING, TESTING) - I reviewed patient records, labs, notes, testing and imaging myself where available.  Lab Results  Component Value Date   WBC 3.5 03/14/2022   HGB 13.2 03/14/2022   HCT 40.3 03/14/2022   MCV 98 (H) 03/14/2022   PLT 256 03/14/2022      Component Value Date/Time   NA 143 03/14/2022 1122   K 4.2 03/14/2022 1122   CL 106 03/14/2022 1122   CO2 23 03/14/2022 1122   GLUCOSE 81 03/14/2022 1122   GLUCOSE 80 02/17/2015 1353   BUN 12 03/14/2022 1122   CREATININE 0.70 03/14/2022 1122   CREATININE 0.77 02/17/2015 1353   CALCIUM 9.3 03/14/2022 1122   PROT 6.3 03/14/2022 1122   ALBUMIN 4.3 03/14/2022 1122   AST 33 03/14/2022 1122   ALT 36 (H) 03/14/2022 1122   ALKPHOS 83 03/14/2022 1122   BILITOT 0.3 03/14/2022 1122   GFRNONAA 93 01/19/2020 1039   GFRNONAA >89 02/17/2015 1353   GFRAA 107 01/19/2020 1039   GFRAA >89 02/17/2015 1353   Lab Results  Component Value Date   CHOL 160  03/30/2022   HDL 70 03/30/2022   LDLCALC 71 03/30/2022   TRIG 110 03/30/2022   CHOLHDL 2.3 03/30/2022   Lab Results  Component Value Date   HGBA1C 5.2 02/17/2016   Lab Results  Component Value Date   VITAMINB12 682 02/18/2019   Lab Results  Component Value Date   TSH 0.927 12/19/2016    ASSESSMENT AND PLAN 57 y.o. year old female  has a past medical history of Abnormal Pap smear, Chlamydia, History of chicken pox, HSV infection, Multiple sclerosis (HCC), Neuromuscular disorder (HCC), and Yeast infection. here with:  1.  Relapsing remitting multiple sclerosis  -MS remains stable, continue generic Gilenya -Check routine labs today and vitamin D level -MRI of the brain with and without contrast in August 2023 showed stable lesions, no change from July 2021 -Dealing with right shoulder injury -Follow-up in 6 months or sooner if needed, will see Dr. Terrace Arabia, has been several years since visit   Otila Kluver, DNP 09/12/2022, 10:06 AM [facility] [address], [address] [place], [place] [address] [phone]

## 2022-09-13 ENCOUNTER — Other Ambulatory Visit: Payer: Self-pay

## 2022-09-13 DIAGNOSIS — E785 Hyperlipidemia, unspecified: Secondary | ICD-10-CM

## 2022-09-13 LAB — CBC WITH DIFFERENTIAL/PLATELET
Basophils Absolute: 0 10*3/uL (ref 0.0–0.2)
Basos: 0 %
EOS (ABSOLUTE): 0 10*3/uL (ref 0.0–0.4)
Eos: 1 %
Hematocrit: 42.5 % (ref 34.0–46.6)
Hemoglobin: 13.5 g/dL (ref 11.1–15.9)
Immature Grans (Abs): 0 10*3/uL (ref 0.0–0.1)
Immature Granulocytes: 0 %
Lymphocytes Absolute: 0.7 10*3/uL (ref 0.7–3.1)
Lymphs: 21 %
MCH: 31.1 pg (ref 26.6–33.0)
MCHC: 31.8 g/dL (ref 31.5–35.7)
MCV: 98 fL — ABNORMAL HIGH (ref 79–97)
Monocytes Absolute: 0.5 10*3/uL (ref 0.1–0.9)
Monocytes: 14 %
Neutrophils Absolute: 2.2 10*3/uL (ref 1.4–7.0)
Neutrophils: 64 %
Platelets: 300 10*3/uL (ref 150–450)
RBC: 4.34 x10E6/uL (ref 3.77–5.28)
RDW: 13 % (ref 11.7–15.4)
WBC: 3.5 10*3/uL (ref 3.4–10.8)

## 2022-09-13 LAB — COMPREHENSIVE METABOLIC PANEL
ALT: 42 IU/L — ABNORMAL HIGH (ref 0–32)
AST: 44 IU/L — ABNORMAL HIGH (ref 0–40)
Albumin: 4.2 g/dL (ref 3.8–4.9)
Alkaline Phosphatase: 111 IU/L (ref 44–121)
BUN/Creatinine Ratio: 15 (ref 9–23)
BUN: 11 mg/dL (ref 6–24)
Bilirubin Total: 0.4 mg/dL (ref 0.0–1.2)
CO2: 25 mmol/L (ref 20–29)
Calcium: 9.8 mg/dL (ref 8.7–10.2)
Chloride: 106 mmol/L (ref 96–106)
Creatinine, Ser: 0.75 mg/dL (ref 0.57–1.00)
Globulin, Total: 2.2 g/dL (ref 1.5–4.5)
Glucose: 100 mg/dL — ABNORMAL HIGH (ref 70–99)
Potassium: 4.3 mmol/L (ref 3.5–5.2)
Sodium: 145 mmol/L — ABNORMAL HIGH (ref 134–144)
Total Protein: 6.4 g/dL (ref 6.0–8.5)
eGFR: 92 mL/min/{1.73_m2} (ref >59)

## 2022-09-13 LAB — VITAMIN D 25 HYDROXY (VIT D DEFICIENCY, FRACTURES): Vit D, 25-Hydroxy: 40.1 ng/mL (ref 30.0–100.0)

## 2022-09-13 MED ORDER — ROSUVASTATIN CALCIUM 10 MG PO TABS
ORAL_TABLET | ORAL | 1 refills | Status: DC
Start: 2022-09-13 — End: 2022-12-13

## 2022-09-14 ENCOUNTER — Other Ambulatory Visit: Payer: Self-pay | Admitting: Nurse Practitioner

## 2022-09-14 DIAGNOSIS — B009 Herpesviral infection, unspecified: Secondary | ICD-10-CM

## 2022-09-25 ENCOUNTER — Telehealth: Payer: Self-pay

## 2022-09-25 NOTE — Telephone Encounter (Signed)
Clearance Letter faxed to [phone]

## 2022-09-28 ENCOUNTER — Other Ambulatory Visit: Payer: Self-pay

## 2022-10-01 ENCOUNTER — Encounter: Payer: Self-pay | Admitting: Nurse Practitioner

## 2022-10-01 ENCOUNTER — Ambulatory Visit (INDEPENDENT_AMBULATORY_CARE_PROVIDER_SITE_OTHER): Payer: BC Managed Care – PPO | Admitting: Nurse Practitioner

## 2022-10-01 VITALS — BP 142/79 | HR 72 | Ht 64.0 in | Wt 152.0 lb

## 2022-10-01 DIAGNOSIS — Z01818 Encounter for other preprocedural examination: Secondary | ICD-10-CM | POA: Diagnosis not present

## 2022-10-01 DIAGNOSIS — Z131 Encounter for screening for diabetes mellitus: Secondary | ICD-10-CM | POA: Insufficient documentation

## 2022-10-01 LAB — POCT GLYCOSYLATED HEMOGLOBIN (HGB A1C): Hemoglobin A1C: 5.9 % — AB (ref 4.0–5.6)

## 2022-10-01 NOTE — Progress Notes (Signed)
@Patient  ID: Kelly Grant, female    DOB: September 01, 1965, 57 y.o.   MRN: [id number]  Chief Complaint  Patient presents with   Follow-up    Referring provider: Ivonne Andrew, NP   HPI  Kelly Grant presents for follow up. She . has a past medical history of Abnormal Pap smear, Chlamydia, History of chicken pox, HSV infection, Multiple sclerosis (HCC), Neuromuscular disorder (HCC), and Yeast infection.    Patient presents today for preop clearance.  She recently injured her right shoulder on the job.  She has surgery scheduled with [facility] Ortho specialist for repair of right shoulder fracture.  Patient does have surgical clearance forms today.  She will need EKG and A1c checked today.  Note: Forms completed for preop eval.  EKG in office was normal sinus rhythm.  A1c was 5.9.  Forms will be faxed to orthopedic office.  Patient is at low risk for surgical complications.     Allergies  Allergen Reactions   Macrobid [Nitrofurantoin Macrocrystal] Hives    Immunization History  Administered Date(s) Administered   Influenza,inj,Quad PF,6+ Mos 11/05/2013, 10/08/2014, 11/04/2016, 11/23/2017   Influenza-Unspecified 11/23/2017   PFIZER(Purple Top)SARS-COV-2 Vaccination 04/07/2019, 04/28/2019, 01/03/2020   Tdap 03/04/2012, 08/05/2013    Past Medical History:  Diagnosis Date   Abnormal Pap smear    ASC-H   Chlamydia    History of chicken pox    HSV infection    Multiple sclerosis (HCC)    Neuromuscular disorder (HCC)    MS   Yeast infection     Tobacco History: Social History   Tobacco Use  Smoking Status Former   Current packs/day: 0.00   Types: Cigarettes   Quit date: 01/23/2000   Years since quitting: 22.7  Smokeless Tobacco Never   Counseling given: Not Answered   Outpatient Encounter Medications as of 10/01/2022  Medication Sig   aspirin EC 81 MG tablet Take 1 tablet (81 mg total) by mouth daily.   baclofen (LIORESAL) 10 MG tablet Take 1 tablet (10 mg  total) by mouth 3 (three) times daily.   Boswellia-Glucosamine-Vit D (OSTEO BI-FLEX ONE PER DAY PO) Take 2 tablets by mouth daily at 6 (six) AM.   calcium-vitamin D (OSCAL WITH D) 500-200 MG-UNIT tablet Take 2 tablets by mouth.    carboxymethylcellulose (REFRESH PLUS) 0.5 % SOLN 1 drop daily as needed.   Fingolimod HCl 0.5 MG CAPS TAKE ONE CAPSULE BY MOUTH ONCE DAILY. MAY TAKE WITH OR WITHOUT FOOD. STORE AT ROOM TEMPERATURE.   Multiple Vitamin (MULTIVITAMIN) tablet Take 1 tablet by mouth daily.   omega-3 acid ethyl esters (LOVAZA) 1 g capsule Take 1 capsule (1 g total) by mouth daily.   Probiotic Product (PROBIOTIC PO) Take 1 capsule by mouth daily at 6 (six) AM.   rosuvastatin (CRESTOR) 10 MG tablet TAKE 1 TABLET(10 MG) BY MOUTH AT BEDTIME   valACYclovir (VALTREX) 500 MG tablet TAKE 1 TABLET(500 MG) BY MOUTH DAILY   Wheat Dextrin (BENEFIBER DRINK MIX PO) Take by mouth. 2 tsp TID   No facility-administered encounter medications on file as of 10/01/2022.     Review of Systems  Review of Systems  Constitutional: Negative.   HENT: Negative.    Cardiovascular: Negative.   Gastrointestinal: Negative.   Allergic/Immunologic: Negative.   Neurological: Negative.   Psychiatric/Behavioral: Negative.         Physical Exam  BP (!) 142/79 (BP Location: Left Arm, Patient Position: Sitting, Cuff Size: Normal)   Pulse 72  Ht 5\' 4"  (1.626 m)   Wt 152 lb (68.9 kg)   SpO2 99%   BMI 26.09 kg/m   Wt Readings from Last 5 Encounters:  10/01/22 152 lb (68.9 kg)  09/12/22 149 lb 8 oz (67.8 kg)  08/26/22 147 lb 11.3 oz (67 kg)  05/07/22 147 lb (66.7 kg)  04/13/22 146 lb (66.2 kg)     Physical Exam Vitals and nursing note reviewed.  Constitutional:      General: She is not in acute distress.    Appearance: She is well-developed.  Cardiovascular:     Rate and Rhythm: Normal rate and regular rhythm.  Pulmonary:     Effort: Pulmonary effort is normal.     Breath sounds: Normal breath  sounds.  Neurological:     Mental Status: She is alert and oriented to person, place, and time.      Lab Results:  CBC    Component Value Date/Time   WBC 3.5 09/12/2022 1030   WBC 2.5 (L) 02/01/2014 0957   RBC 4.34 09/12/2022 1030   RBC 4.16 02/01/2014 0957   HGB 13.5 09/12/2022 1030   HCT 42.5 09/12/2022 1030   PLT 300 09/12/2022 1030   MCV 98 (H) 09/12/2022 1030   MCH 31.1 09/12/2022 1030   MCH 32.2 02/01/2014 0957   MCHC 31.8 09/12/2022 1030   MCHC 33.7 02/01/2014 0957   RDW 13.0 09/12/2022 1030   LYMPHSABS 0.7 09/12/2022 1030   MONOABS 0.4 02/01/2014 0957   EOSABS 0.0 09/12/2022 1030   BASOSABS 0.0 09/12/2022 1030    BMET    Component Value Date/Time   NA 145 (H) 09/12/2022 1030   K 4.3 09/12/2022 1030   CL 106 09/12/2022 1030   CO2 25 09/12/2022 1030   GLUCOSE 100 (H) 09/12/2022 1030   GLUCOSE 80 02/17/2015 1353   BUN 11 09/12/2022 1030   CREATININE 0.75 09/12/2022 1030   CREATININE 0.77 02/17/2015 1353   CALCIUM 9.8 09/12/2022 1030   GFRNONAA 93 01/19/2020 1039   GFRNONAA >89 02/17/2015 1353   GFRAA 107 01/19/2020 1039   GFRAA >89 02/17/2015 1353      Assessment & Plan:   Diabetes mellitus screening - POCT glycosylated hemoglobin (Hb A1C)  2. Preop general physical exam  - EKG 12-Lead  Follow up:  Follow up in 3 months     Ivonne Andrew, NP 10/01/2022

## 2022-10-01 NOTE — Patient Instructions (Signed)
1. Diabetes mellitus screening  - POCT glycosylated hemoglobin (Hb A1C)  2. Preop general physical exam  - EKG 12-Lead  Follow up:  Follow up in 3 months

## 2022-10-01 NOTE — Assessment & Plan Note (Signed)
-   POCT glycosylated hemoglobin (Hb A1C)  2. Preop general physical exam  - EKG 12-Lead  Follow up:  Follow up in 3 months

## 2022-10-02 ENCOUNTER — Other Ambulatory Visit: Payer: Self-pay

## 2022-10-11 ENCOUNTER — Other Ambulatory Visit: Payer: Self-pay | Admitting: Sports Medicine

## 2022-10-12 NOTE — Progress Notes (Signed)
Sent message, via epic in basket, requesting orders in epic from surgeon.  

## 2022-10-15 DIAGNOSIS — M25511 Pain in right shoulder: Secondary | ICD-10-CM | POA: Diagnosis not present

## 2022-10-16 NOTE — Progress Notes (Addendum)
Anesthesia Review:  PCP: Warner Mccreedy LOV 10/01/22  on chart  for clearance for surgery Clearance 10/01/22 on chart  Neurology- Loni Dolly LOV 09/12/22  Clearance requested from Ortho office on 10/19/22.  They are to fax.  Cardiologist : Chest x-ray : EKG : 10/01/22 on chart  Echo : Stress test: Cardiac Cath :  Activity level: can do a flight of stairs without difficutly  Sleep Study/ CPAP : none  Fasting Blood Sugar :      / Checks Blood Sugar -- times a day:   Blood Thinner/ Instructions /Last Dose: ASA / Instructions/ Last Dose :    81 mg aspirin  10/01/22-hgba1c- 5.9    PT has MS - pt ambulates without difficulty .  NO assistive devices.

## 2022-10-17 NOTE — Patient Instructions (Signed)
SURGICAL WAITING ROOM VISITATION  Patients having surgery or a procedure may have no more than 2 support people in the waiting area - these visitors may rotate.    Children under the age of 70 must have an adult with them who is not the patient.  Due to an increase in RSV and influenza rates and associated hospitalizations, children ages 34 and under may not visit patients in [facility] hospitals.  If the patient needs to stay at the hospital during part of their recovery, the visitor guidelines for inpatient rooms apply. Pre-op nurse will coordinate an appropriate time for 1 support person to accompany patient in pre-op.  This support person may not rotate.    Please refer to the [facility] website for the visitor guidelines for Inpatients (after your surgery is over and you are in a regular room).       Your procedure is scheduled on:  10/30/2022    Report to [facility] Main Entrance    Report to admitting at    0900AM   Call this number if you have problems the morning of surgery [phone]   Do not eat food :After Midnight.   After Midnight you may have the following liquids until __ 0830____ AM DAY OF SURGERY  Water Non-Citrus Juices (without pulp, NO RED-Apple, White grape, White cranberry) Black Coffee (NO MILK/CREAM OR CREAMERS, sugar ok)  Clear Tea (NO MILK/CREAM OR CREAMERS, sugar ok) regular and decaf                             Plain Jell-O (NO RED)                                           Fruit ices (not with fruit pulp, NO RED)                                     Popsicles (NO RED)                                                               Sports drinks like Gatorade (NO RED)                    The day of surgery:  Drink ONE (1) Pre-Surgery Clear Ensure or G2 at   0830 AM ( have completed by )  the morning of surgery. Drink in one sitting. Do not sip.  This drink was given to you during your hospital  pre-op appointment visit. Nothing else to  drink after completing the  Pre-Surgery Clear Ensure or G2.          If you have questions, please contact your surgeon's office.       Oral Hygiene is also important to reduce your risk of infection.                                    Remember - BRUSH YOUR TEETH THE MORNING OF  SURGERY WITH YOUR REGULAR TOOTHPASTE  DENTURES WILL BE REMOVED PRIOR TO SURGERY PLEASE DO NOT APPLY "Poly grip" OR ADHESIVES!!!   Do NOT smoke after Midnight   Stop all vitamins and herbal supplements 7 days before surgery.   Take these medicines the morning of surgery with A SIP OF WATER:  none   DO NOT TAKE ANY ORAL DIABETIC MEDICATIONS DAY OF YOUR SURGERY  Bring CPAP mask and tubing day of surgery.                              You may not have any metal on your body including hair pins, jewelry, and body piercing             Do not wear make-up, lotions, powders, perfumes/cologne, or deodorant  Do not wear nail polish including gel and S&S, artificial/acrylic nails, or any other type of covering on natural nails including finger and toenails. If you have artificial nails, gel coating, etc. that needs to be removed by a nail salon please have this removed prior to surgery or surgery may need to be canceled/ delayed if the surgeon/ anesthesia feels like they are unable to be safely monitored.   Do not shave  48 hours prior to surgery.               Men may shave face and neck.   Do not bring valuables to the hospital. [facility] IS NOT             RESPONSIBLE   FOR VALUABLES.   Contacts, glasses, dentures or bridgework may not be worn into surgery.   Bring small overnight bag day of surgery.   DO NOT BRING YOUR HOME MEDICATIONS TO THE HOSPITAL. PHARMACY WILL DISPENSE MEDICATIONS LISTED ON YOUR MEDICATION LIST TO YOU DURING YOUR ADMISSION IN THE HOSPITAL!    Patients discharged on the day of surgery will not be allowed to drive home.  Someone NEEDS to stay with you for the first 24 hours after  anesthesia.   Special Instructions: Bring a copy of your healthcare power of attorney and living will documents the day of surgery if you haven't scanned them before.              Please read over the following fact sheets you were given: IF YOU HAVE QUESTIONS ABOUT YOUR PRE-OP INSTRUCTIONS PLEASE CALL [phone]   If you received a COVID test during your pre-op visit  it is requested that you wear a mask when out in public, stay away from anyone that may not be feeling well and notify your surgeon if you develop symptoms. If you test positive for Covid or have been in contact with anyone that has tested positive in the last 10 days please notify you surgeon.      Pre-operative 5 CHG Bath Instructions   You can play a key role in reducing the risk of infection after surgery. Your skin needs to be as free of germs as possible. You can reduce the number of germs on your skin by washing with CHG (chlorhexidine gluconate) soap before surgery. CHG is an antiseptic soap that kills germs and continues to kill germs even after washing.   DO NOT use if you have an allergy to chlorhexidine/CHG or antibacterial soaps. If your skin becomes reddened or irritated, stop using the CHG and notify one of our RNs at [phone].   Please shower with the CHG  soap starting 4 days before surgery using the following schedule:     Please keep in mind the following:  DO NOT shave, including legs and underarms, starting the day of your first shower.   You may shave your face at any point before/day of surgery.  Place clean sheets on your bed the day you start using CHG soap. Use a clean washcloth (not used since being washed) for each shower. DO NOT sleep with pets once you start using the CHG.   CHG Shower Instructions:  If you choose to wash your hair and private area, wash first with your normal shampoo/soap.  After you use shampoo/soap, rinse your hair and body thoroughly to remove shampoo/soap residue.   Turn the water OFF and apply about 3 tablespoons (45 ml) of CHG soap to a CLEAN washcloth.  Apply CHG soap ONLY FROM YOUR NECK DOWN TO YOUR TOES (washing for 3-5 minutes)  DO NOT use CHG soap on face, private areas, open wounds, or sores.  Pay special attention to the area where your surgery is being performed.  If you are having back surgery, having someone wash your back for you may be helpful. Wait 2 minutes after CHG soap is applied, then you may rinse off the CHG soap.  Pat dry with a clean towel  Put on clean clothes/pajamas   If you choose to wear lotion, please use ONLY the CHG-compatible lotions on the back of this paper.     Additional instructions for the day of surgery: DO NOT APPLY any lotions, deodorants, cologne, or perfumes.   Put on clean/comfortable clothes.  Brush your teeth.  Ask your nurse before applying any prescription medications to the skin.      CHG Compatible Lotions   Aveeno Moisturizing lotion  Cetaphil Moisturizing Cream  Cetaphil Moisturizing Lotion  Clairol Herbal Essence Moisturizing Lotion, Dry Skin  Clairol Herbal Essence Moisturizing Lotion, Extra Dry Skin  Clairol Herbal Essence Moisturizing Lotion, Normal Skin  Curel Age Defying Therapeutic Moisturizing Lotion with Alpha Hydroxy  Curel Extreme Care Body Lotion  Curel Soothing Hands Moisturizing Hand Lotion  Curel Therapeutic Moisturizing Cream, Fragrance-Free  Curel Therapeutic Moisturizing Lotion, Fragrance-Free  Curel Therapeutic Moisturizing Lotion, Original Formula  Eucerin Daily Replenishing Lotion  Eucerin Dry Skin Therapy Plus Alpha Hydroxy Crme  Eucerin Dry Skin Therapy Plus Alpha Hydroxy Lotion  Eucerin Original Crme  Eucerin Original Lotion  Eucerin Plus Crme Eucerin Plus Lotion  Eucerin TriLipid Replenishing Lotion  Keri Anti-Bacterial Hand Lotion  Keri Deep Conditioning Original Lotion Dry Skin Formula Softly Scented  Keri Deep Conditioning Original Lotion, Fragrance  Free Sensitive Skin Formula  Keri Lotion Fast Absorbing Fragrance Free Sensitive Skin Formula  Keri Lotion Fast Absorbing Softly Scented Dry Skin Formula  Keri Original Lotion  Keri Skin Renewal Lotion Keri Silky Smooth Lotion  Keri Silky Smooth Sensitive Skin Lotion  Nivea Body Creamy Conditioning Oil  Nivea Body Extra Enriched Lotion  Nivea Body Original Lotion  Nivea Body Sheer Moisturizing Lotion Nivea Crme  Nivea Skin Firming Lotion  NutraDerm 30 Skin Lotion  NutraDerm Skin Lotion  NutraDerm Therapeutic Skin Cream  NutraDerm Therapeutic Skin Lotion  ProShield Protective Hand Cream  Provon moisturizing lotion   [facility]- Preparing for Total Shoulder Arthroplasty    Before surgery, you can play an important role. Because skin is not sterile, your skin needs to be as free of germs as possible. You can reduce the number of germs on your skin by using the following products.  Benzoyl Peroxide Gel Reduces the number of germs present on the skin Applied twice a day to shoulder area starting two days before surgery    ==================================================================  Please follow these instructions carefully:  BENZOYL PEROXIDE 5% GEL  Please do not use if you have an allergy to benzoyl peroxide.   If your skin becomes reddened/irritated stop using the benzoyl peroxide.  Starting two days before surgery, apply as follows: Apply benzoyl peroxide in the morning and at night. Apply after taking a shower. If you are not taking a shower clean entire shoulder front, back, and side along with the armpit with a clean wet washcloth.  Place a quarter-sized dollop on your shoulder and rub in thoroughly, making sure to cover the front, back, and side of your shoulder, along with the armpit.   2 days before ____ AM   ____ PM              1 day before ____ AM   ____ PM                         Do this twice a day for two days.  (Last application is the night before surgery,  AFTER using the CHG soap as described below).  Do NOT apply benzoyl peroxide gel on the day of surgery.

## 2022-10-19 ENCOUNTER — Other Ambulatory Visit: Payer: Self-pay

## 2022-10-19 ENCOUNTER — Encounter (HOSPITAL_COMMUNITY)
Admission: RE | Admit: 2022-10-19 | Discharge: 2022-10-19 | Disposition: A | Discharge status: Home / Self Care | Payer: BC Managed Care – PPO | Source: Ambulatory Visit | Attending: Orthopedic Surgery | Admitting: Orthopedic Surgery

## 2022-10-19 ENCOUNTER — Encounter (HOSPITAL_COMMUNITY): Payer: Self-pay

## 2022-10-19 VITALS — BP 136/96 | HR 73 | Temp 98.3°F | Resp 16 | Ht 64.0 in | Wt 145.0 lb

## 2022-10-19 DIAGNOSIS — Z01818 Encounter for other preprocedural examination: Secondary | ICD-10-CM

## 2022-10-19 DIAGNOSIS — Z01812 Encounter for preprocedural laboratory examination: Secondary | ICD-10-CM | POA: Diagnosis not present

## 2022-10-19 HISTORY — DX: Unspecified osteoarthritis, unspecified site: M19.90

## 2022-10-19 LAB — SURGICAL PCR SCREEN
MRSA, PCR: NEGATIVE
Staphylococcus aureus: NEGATIVE

## 2022-10-19 LAB — BASIC METABOLIC PANEL
Anion gap: 10 (ref 5–15)
BUN: 16 mg/dL (ref 6–20)
CO2: 26 mmol/L (ref 22–32)
Calcium: 9.5 mg/dL (ref 8.9–10.3)
Chloride: 106 mmol/L (ref 98–111)
Creatinine, Ser: 0.62 mg/dL (ref 0.44–1.00)
GFR, Estimated: >60 mL/min (ref >60)
Glucose, Bld: 88 mg/dL (ref 70–99)
Potassium: 4.4 mmol/L (ref 3.5–5.1)
Sodium: 142 mmol/L (ref 135–145)

## 2022-10-19 LAB — CBC
HCT: 42.6 % (ref 36.0–46.0)
Hemoglobin: 13.8 g/dL (ref 12.0–15.0)
MCH: 32.6 pg (ref 26.0–34.0)
MCHC: 32.4 g/dL (ref 30.0–36.0)
MCV: 100.7 fL — ABNORMAL HIGH (ref 80.0–100.0)
Platelets: 254 10*3/uL (ref 150–400)
RBC: 4.23 MIL/uL (ref 3.87–5.11)
RDW: 13.8 % (ref 11.5–15.5)
WBC: 3.3 10*3/uL — ABNORMAL LOW (ref 4.0–10.5)
nRBC: 0 % (ref 0.0–0.2)

## 2022-10-27 NOTE — H&P (Signed)
SHOULDER ARTHROPLASTY ADMISSION H&P  Patient ID: Kelly Grant MRN: [id number] DOB/AGE: 1965-03-05 57 y.o.  Chief Complaint: right shoulder pain.  Planned Procedure Date: 10/30/22  Medical Clearance by Angus Seller, NP   HPI: Kelly Grant is a 57 y.o. female who presents for evaluation of RIGHT SHOULDER OSTEOARTHRITIS. The patient has a history of pain and functional disability in the right shoulder due to arthritis and has failed non-surgical conservative treatments for greater than 12 weeks to include NSAID's and/or analgesics and activity modification.  Onset of symptoms was abrupt, starting 1 years ago with rapidlly worsening course since that time. The patient noted no past surgery on the right shoulder.  Patient currently rates pain at 9 out of 10 with activity. Patient has worsening of pain with activity and weight bearing and pain that interferes with activities of daily living.  Patient has evidence of joint space narrowing by imaging studies.  There is no active infection.  Past Medical History:  Diagnosis Date   Abnormal Pap smear    ASC-H   Arthritis    Chlamydia    History of chicken pox    HSV infection    Multiple sclerosis (HCC)    Neuromuscular disorder (HCC)    MS   Yeast infection    Past Surgical History:  Procedure Laterality Date   CARPAL TUNNEL RELEASE Left    in addition to wrist fracture surgery   CESAREAN SECTION     x2   COLONOSCOPY  2016   [id number]([id number])-TA   INCISION AND DRAINAGE ABSCESS Left    left buttock   POLYPECTOMY  2016   TA   TUBAL LIGATION     Allergies  Allergen Reactions   Macrobid [Nitrofurantoin Macrocrystal] Hives   Prior to Admission medications   Medication Sig Start Date End Date Taking? Authorizing Provider  aspirin EC 81 MG tablet Take 1 tablet (81 mg total) by mouth daily. 02/15/17  Yes Massie Maroon, FNP  baclofen (LIORESAL) 10 MG tablet Take 1 tablet (10 mg total) by mouth 3 (three) times daily. Patient  taking differently: Take 10 mg by mouth 2 (two) times daily as needed for muscle spasms. 08/26/22  Yes Stanhope, Donavan Burnet, FNP  Boswellia-Glucosamine-Vit D (OSTEO BI-FLEX ONE PER DAY PO) Take 2 tablets by mouth daily at 6 (six) AM.   Yes [provider]  calcium-vitamin D (OSCAL WITH D) 500-200 MG-UNIT tablet Take 2 tablets by mouth in the morning.   Yes [provider]  carboxymethylcellulose (REFRESH PLUS) 0.5 % SOLN Place 1 drop into both eyes 2 (two) times daily as needed (dry/irritated eyes.).   Yes [provider]  Fingolimod HCl 0.5 MG CAPS TAKE ONE CAPSULE BY MOUTH ONCE DAILY. MAY TAKE WITH OR WITHOUT FOOD. STORE AT ROOM TEMPERATURE. 09/12/22  Yes Glean Salvo, NP  meloxicam (MOBIC) 15 MG tablet Take 15 mg by mouth daily as needed (pain/inflammation.). 09/14/22  Yes [provider]  Multiple Vitamin (MULTIVITAMIN) tablet Take 1 tablet by mouth daily. 04/29/15  Yes Massie Maroon, FNP  omega-3 acid ethyl esters (LOVAZA) 1 g capsule Take 1 capsule (1 g total) by mouth daily. 02/17/20  Yes Barbette Merino, NP  Probiotic Product (PROBIOTIC PO) Take 1 capsule by mouth daily at 6 (six) AM.   Yes [provider]  rosuvastatin (CRESTOR) 10 MG tablet TAKE 1 TABLET(10 MG) BY MOUTH AT BEDTIME 09/13/22  Yes Ivonne Andrew, NP  valACYclovir (VALTREX) 500 MG tablet TAKE 1  TABLET(500 MG) BY MOUTH DAILY 09/14/22  Yes Ivonne Andrew, NP  Wheat Dextrin (BENEFIBER DRINK MIX PO) Take 1 Dose by mouth in the morning and at bedtime.   Yes [provider]   Social History   Socioeconomic History   Marital status: Single    Spouse name: Not on file   Number of children: 2   Years of education: College    Highest education level: Bachelor's degree (e.g., BA, AB, BS)  Occupational History   Occupation: HOUSEKEEPING    Employer: [employer]  Tobacco Use   Smoking status: Former    Current packs/day: 0.00    Types: Cigarettes    Quit date: 01/23/2000     Years since quitting: 22.7   Smokeless tobacco: Never  Vaping Use   Vaping status: Never Used  Substance and Sexual Activity   Alcohol use: Yes    Alcohol/week: 1.0 standard drink of alcohol    Types: 1 Standard drinks or equivalent per week    Comment: rarely   Drug use: No   Sexual activity: Not Currently    Partners: Male    Birth control/protection: Surgical    Comment: tubal ligation per pt  Other Topics Concern   Not on file  Social History Narrative   Patient lives at home with children.    Patient has 2 children.    Patient has a college degree.    Patient is single.    Patient is right handed.    Patient is currently employed.    Social Determinants of Health   Financial Resource Strain: Not on file  Food Insecurity: Not on file  Transportation Needs: Not on file  Physical Activity: Not on file  Stress: Not on file  Social Connections: Not on file   Family History  Problem Relation Age of Onset   Colon polyps Mother    Kidney failure Mother    Diabetes Mother    Colon cancer Neg Hx    Esophageal cancer Neg Hx    Rectal cancer Neg Hx    Stomach cancer Neg Hx     ROS: Currently denies lightheadedness, dizziness, Fever, chills, CP, SOB.   No personal history of DVT, PE, MI, or CVA. No loose teeth or dentures All other systems have been reviewed and were otherwise currently negative with the exception of those mentioned in the HPI and as above.  BMI: Estimated body mass index is 24.89 kg/m as calculated from the following:   Height as of 10/19/22: 5\' 4"  (1.626 m).   Weight as of 10/19/22: 65.8 kg.  Lab Results  Component Value Date   ALBUMIN 4.2 09/12/2022   Diabetes: Patient does not have a diagnosis of diabetes. Lab Results  Component Value Date   HGBA1C 5.9 (A) 10/01/2022     Smoking Status:       Objective: Vitals: Vitals:  Height 5'5".  Weight 149 pounds.  BMI 24.8.  Temp 99.2 degrees Fahrenheit.  BP 141/83.  Pulse 76.  SpO2 99% on room  air. Physical Exam: General: Alert, NAD.   HEENT: EOMI, Good Neck Extension  Pulm: No increased work of breathing.  Clear B/L A/P w/o crackle or wheeze.  CV: RRR, No m/g/r appreciated  GI: soft, NT, ND Neuro: Neuro without gross focal deficit.  Sensation intact distally Skin: No lesions in the area of chief complaint MSK/Surgical Site: She has about 20 degrees of active forward flexion.  External rotation neutral.  Internal rotation to the lateral  right thigh.  All fingers flex, extend, and abduct.  Distal sensation is intact.   Imaging Review MRI taken September 01, 2022, shows right shoulder AVN with full thickness rotator cuff tear.  Assessment: Right shoulder AVN with full thickness rotator cuff tear.   Plan: Plan for Procedure(s): REVERSE SHOULDER ARTHROPLASTY  The patient history, physical exam, clinical judgement of the provider and imaging are consistent with end stage degenerative joint disease and reverse total joint arthroplasty is deemed medically necessary. The treatment options including medical management, injection therapy, and arthroplasty were discussed at length. The risks and benefits of Procedure(s): REVERSE SHOULDER ARTHROPLASTY were presented and reviewed.  The risks of nonoperative treatment, versus surgical intervention including but not limited to continued pain, aseptic loosening, stiffness, dislocation/subluxation, infection, bleeding, nerve injury, blood clots, cardiopulmonary complications, morbidity, mortality, among others were discussed. The patient verbalizes understanding and wishes to proceed with the plan.  Patient is being admitted for surgery, OT, pain control, prophylactic antibiotics, VTE prophylaxis, progressive ambulation, ADL's and discharge planning.   The patient is planning to be discharged home care of sisters.    Armida Sans, PA-C 10/27/2022 1:21 PM

## 2022-10-29 NOTE — Anesthesia Preprocedure Evaluation (Signed)
Anesthesia Evaluation  Patient identified by MRN, date of birth, ID band Patient awake    Reviewed: Allergy & Precautions, H&P , NPO status , Patient's Chart, lab work & pertinent test results  Airway Mallampati: II  TM Distance: >3 FB Neck ROM: Full    Dental no notable dental hx. (+) Teeth Intact, Dental Advisory Given   Pulmonary neg pulmonary ROS, former smoker   Pulmonary exam normal breath sounds clear to auscultation       Cardiovascular Exercise Tolerance: Good negative cardio ROS  Rhythm:Regular Rate:Normal     Neuro/Psych negative neurological ROS  negative psych ROS   GI/Hepatic negative GI ROS, Neg liver ROS,,,  Endo/Other  negative endocrine ROS    Renal/GU negative Renal ROS  negative genitourinary   Musculoskeletal  (+) Arthritis , Osteoarthritis,    Abdominal   Peds  Hematology negative hematology ROS (+)   Anesthesia Other Findings   Reproductive/Obstetrics negative OB ROS                             Anesthesia Physical Anesthesia Plan  ASA: 2  Anesthesia Plan: General   Post-op Pain Management: Regional block*, Tylenol PO (pre-op)* and Toradol IV (intra-op)*   Induction: Intravenous  PONV Risk Score and Plan: 4 or greater and Ondansetron, Dexamethasone and Midazolam  Airway Management Planned:   Additional Equipment:   Intra-op Plan:   Post-operative Plan: Extubation in OR  Informed Consent: I have reviewed the patients History and Physical, chart, labs and discussed the procedure including the risks, benefits and alternatives for the proposed anesthesia with the patient or authorized representative who has indicated his/her understanding and acceptance.     Dental advisory given  Plan Discussed with: CRNA  Anesthesia Plan Comments:        Anesthesia Quick Evaluation

## 2022-10-30 ENCOUNTER — Other Ambulatory Visit (HOSPITAL_COMMUNITY): Payer: Self-pay

## 2022-10-30 ENCOUNTER — Other Ambulatory Visit: Payer: Self-pay

## 2022-10-30 ENCOUNTER — Ambulatory Visit (HOSPITAL_COMMUNITY): Payer: Self-pay | Admitting: Anesthesiology

## 2022-10-30 ENCOUNTER — Ambulatory Visit (HOSPITAL_COMMUNITY)
Admission: RE | Admit: 2022-10-30 | Discharge: 2022-10-30 | Disposition: A | Discharge status: Home / Self Care | Payer: BC Managed Care – PPO | Attending: Orthopedic Surgery | Admitting: Orthopedic Surgery

## 2022-10-30 ENCOUNTER — Ambulatory Visit (HOSPITAL_COMMUNITY): Payer: BC Managed Care – PPO

## 2022-10-30 ENCOUNTER — Encounter (HOSPITAL_COMMUNITY)
Admission: RE | Disposition: A | Discharge status: Home / Self Care | Payer: Self-pay | Source: Home / Self Care | Attending: Orthopedic Surgery

## 2022-10-30 ENCOUNTER — Ambulatory Visit (HOSPITAL_COMMUNITY): Payer: BC Managed Care – PPO | Admitting: Anesthesiology

## 2022-10-30 ENCOUNTER — Encounter (HOSPITAL_COMMUNITY): Payer: Self-pay | Admitting: Orthopedic Surgery

## 2022-10-30 DIAGNOSIS — M19011 Primary osteoarthritis, right shoulder: Secondary | ICD-10-CM | POA: Insufficient documentation

## 2022-10-30 DIAGNOSIS — Z7982 Long term (current) use of aspirin: Secondary | ICD-10-CM | POA: Diagnosis not present

## 2022-10-30 DIAGNOSIS — Z471 Aftercare following joint replacement surgery: Secondary | ICD-10-CM | POA: Diagnosis not present

## 2022-10-30 DIAGNOSIS — G8918 Other acute postprocedural pain: Secondary | ICD-10-CM | POA: Diagnosis not present

## 2022-10-30 DIAGNOSIS — Z87891 Personal history of nicotine dependence: Secondary | ICD-10-CM | POA: Diagnosis not present

## 2022-10-30 DIAGNOSIS — G35 Multiple sclerosis: Secondary | ICD-10-CM | POA: Insufficient documentation

## 2022-10-30 DIAGNOSIS — Z79624 Long term (current) use of inhibitors of nucleotide synthesis: Secondary | ICD-10-CM | POA: Diagnosis not present

## 2022-10-30 DIAGNOSIS — Z96611 Presence of right artificial shoulder joint: Secondary | ICD-10-CM | POA: Diagnosis not present

## 2022-10-30 DIAGNOSIS — M879 Osteonecrosis, unspecified: Secondary | ICD-10-CM | POA: Diagnosis not present

## 2022-10-30 DIAGNOSIS — Z79899 Other long term (current) drug therapy: Secondary | ICD-10-CM | POA: Insufficient documentation

## 2022-10-30 DIAGNOSIS — M75121 Complete rotator cuff tear or rupture of right shoulder, not specified as traumatic: Secondary | ICD-10-CM | POA: Insufficient documentation

## 2022-10-30 DIAGNOSIS — M75101 Unspecified rotator cuff tear or rupture of right shoulder, not specified as traumatic: Secondary | ICD-10-CM | POA: Diagnosis present

## 2022-10-30 DIAGNOSIS — Z01818 Encounter for other preprocedural examination: Secondary | ICD-10-CM

## 2022-10-30 HISTORY — PX: REVERSE SHOULDER ARTHROPLASTY: SHX5054

## 2022-10-30 SURGERY — ARTHROPLASTY, SHOULDER, TOTAL, REVERSE
Anesthesia: General | Site: Shoulder | Laterality: Right

## 2022-10-30 MED ORDER — DEXAMETHASONE SODIUM PHOSPHATE 10 MG/ML IJ SOLN
INTRAMUSCULAR | Status: AC
  Filled 2022-10-30: qty 1

## 2022-10-30 MED ORDER — LIDOCAINE HCL (PF) 2 % IJ SOLN
INTRAMUSCULAR | Status: AC
  Filled 2022-10-30: qty 5

## 2022-10-30 MED ORDER — BACLOFEN 10 MG PO TABS
10.0000 mg | ORAL_TABLET | Freq: Three times a day (TID) | ORAL | 0 refills | Status: DC
  Filled 2022-10-30: qty 50, 17d supply, fill #0

## 2022-10-30 MED ORDER — CHLORHEXIDINE GLUCONATE 0.12 % MT SOLN
15.0000 mL | Freq: Once | OROMUCOSAL | Status: AC
  Administered 2022-10-30: 15 mL via OROMUCOSAL

## 2022-10-30 MED ORDER — METOCLOPRAMIDE HCL 5 MG/ML IJ SOLN: 5.0000 mg | Freq: Three times a day (TID) | INTRAMUSCULAR | Status: DC | PRN

## 2022-10-30 MED ORDER — FENTANYL CITRATE (PF) 100 MCG/2ML IJ SOLN
INTRAMUSCULAR | Status: AC
  Filled 2022-10-30: qty 2

## 2022-10-30 MED ORDER — ORAL CARE MOUTH RINSE: 15.0000 mL | Freq: Once | OROMUCOSAL | Status: AC

## 2022-10-30 MED ORDER — OXYCODONE HCL 5 MG PO TABS
5.0000 mg | ORAL_TABLET | ORAL | 0 refills | Status: DC | PRN
Start: 2022-10-30 — End: 2023-03-18
  Filled 2022-10-30: qty 30, 5d supply, fill #0

## 2022-10-30 MED ORDER — ROCURONIUM BROMIDE 100 MG/10ML IV SOLN
INTRAVENOUS | Status: DC | PRN
  Administered 2022-10-30: 50 mg via INTRAVENOUS

## 2022-10-30 MED ORDER — TRANEXAMIC ACID-NACL 1000-0.7 MG/100ML-% IV SOLN: 1000.0000 mg | Freq: Once | INTRAVENOUS | Status: DC

## 2022-10-30 MED ORDER — PHENYLEPHRINE 80 MCG/ML (10ML) SYRINGE FOR IV PUSH (FOR BLOOD PRESSURE SUPPORT)
PREFILLED_SYRINGE | INTRAVENOUS | Status: DC | PRN
Start: 2022-10-30 — End: 2022-10-30
  Administered 2022-10-30 (×2): 80 ug via INTRAVENOUS

## 2022-10-30 MED ORDER — PROPOFOL 10 MG/ML IV BOLUS
INTRAVENOUS | Status: DC | PRN
  Administered 2022-10-30: 130 mg via INTRAVENOUS

## 2022-10-30 MED ORDER — ONDANSETRON HCL 4 MG/2ML IJ SOLN
INTRAMUSCULAR | Status: DC | PRN
  Administered 2022-10-30: 4 mg via INTRAVENOUS

## 2022-10-30 MED ORDER — MIDAZOLAM HCL 2 MG/2ML IJ SOLN
INTRAMUSCULAR | Status: AC
  Filled 2022-10-30: qty 2

## 2022-10-30 MED ORDER — ONDANSETRON HCL 4 MG/2ML IJ SOLN
INTRAMUSCULAR | Status: AC
  Filled 2022-10-30: qty 2

## 2022-10-30 MED ORDER — PROPOFOL 500 MG/50ML IV EMUL
INTRAVENOUS | Status: AC
  Filled 2022-10-30: qty 50

## 2022-10-30 MED ORDER — CEFAZOLIN SODIUM-DEXTROSE 2-4 GM/100ML-% IV SOLN
2.0000 g | INTRAVENOUS | Status: AC
  Administered 2022-10-30: 2 g via INTRAVENOUS

## 2022-10-30 MED ORDER — SENNA-DOCUSATE SODIUM 8.6-50 MG PO TABS
2.0000 | ORAL_TABLET | Freq: Every day | ORAL | 1 refills | Status: DC
Start: 2022-10-30 — End: 2023-08-07
  Filled 2022-10-30: qty 30, 15d supply, fill #0
  Filled 2022-11-22 – 2022-11-26 (×2): qty 30, 15d supply, fill #1

## 2022-10-30 MED ORDER — BUPIVACAINE-EPINEPHRINE (PF) 0.5% -1:200000 IJ SOLN
INTRAMUSCULAR | Status: DC | PRN
  Administered 2022-10-30: 15 mL via PERINEURAL

## 2022-10-30 MED ORDER — MIDAZOLAM HCL 5 MG/5ML IJ SOLN
INTRAMUSCULAR | Status: DC | PRN
  Administered 2022-10-30: 2 mg via INTRAVENOUS

## 2022-10-30 MED ORDER — PROPOFOL 10 MG/ML IV BOLUS
INTRAVENOUS | Status: AC
  Filled 2022-10-30: qty 20

## 2022-10-30 MED ORDER — TRANEXAMIC ACID-NACL 1000-0.7 MG/100ML-% IV SOLN
1000.0000 mg | INTRAVENOUS | Status: AC
  Administered 2022-10-30: 1000 mg via INTRAVENOUS

## 2022-10-30 MED ORDER — LACTATED RINGERS IV BOLUS: 250.0000 mL | Freq: Once | INTRAVENOUS | Status: DC

## 2022-10-30 MED ORDER — ONDANSETRON HCL 4 MG/2ML IJ SOLN: 4.0000 mg | Freq: Four times a day (QID) | INTRAMUSCULAR | Status: DC | PRN

## 2022-10-30 MED ORDER — ACETAMINOPHEN 325 MG PO TABS: 325.0000 mg | ORAL_TABLET | Freq: Four times a day (QID) | ORAL | Status: DC | PRN

## 2022-10-30 MED ORDER — LACTATED RINGERS IV SOLN: INTRAVENOUS | Status: DC

## 2022-10-30 MED ORDER — DEXAMETHASONE SODIUM PHOSPHATE 10 MG/ML IJ SOLN
INTRAMUSCULAR | Status: DC | PRN
Start: 2022-10-30 — End: 2022-10-30
  Administered 2022-10-30: 5 mg via INTRAVENOUS

## 2022-10-30 MED ORDER — BUPIVACAINE HCL (PF) 0.25 % IJ SOLN
INTRAMUSCULAR | Status: AC
  Filled 2022-10-30: qty 30

## 2022-10-30 MED ORDER — CEFAZOLIN SODIUM-DEXTROSE 1-4 GM/50ML-% IV SOLN: 1.0000 g | Freq: Four times a day (QID) | INTRAVENOUS | Status: DC

## 2022-10-30 MED ORDER — PHENYLEPHRINE 80 MCG/ML (10ML) SYRINGE FOR IV PUSH (FOR BLOOD PRESSURE SUPPORT)
PREFILLED_SYRINGE | INTRAVENOUS | Status: AC
  Filled 2022-10-30: qty 10

## 2022-10-30 MED ORDER — OXYCODONE HCL 5 MG PO TABS: 5.0000 mg | ORAL_TABLET | ORAL | Status: DC | PRN

## 2022-10-30 MED ORDER — OXYCODONE HCL 5 MG PO TABS: 10.0000 mg | ORAL_TABLET | ORAL | Status: DC | PRN

## 2022-10-30 MED ORDER — HYDROMORPHONE HCL 1 MG/ML IJ SOLN: 0.5000 mg | INTRAMUSCULAR | Status: DC | PRN

## 2022-10-30 MED ORDER — ACETAMINOPHEN 500 MG PO TABS: 1000.0000 mg | ORAL_TABLET | Freq: Four times a day (QID) | ORAL | Status: DC

## 2022-10-30 MED ORDER — LACTATED RINGERS IV BOLUS
500.0000 mL | Freq: Once | INTRAVENOUS | Status: AC
  Administered 2022-10-30: 500 mL via INTRAVENOUS

## 2022-10-30 MED ORDER — 0.9 % SODIUM CHLORIDE (POUR BTL) OPTIME
TOPICAL | Status: DC | PRN
  Administered 2022-10-30: 1000 mL

## 2022-10-30 MED ORDER — METOCLOPRAMIDE HCL 5 MG PO TABS
5.0000 mg | ORAL_TABLET | Freq: Three times a day (TID) | ORAL | Status: DC | PRN
  Filled 2022-10-30: qty 2

## 2022-10-30 MED ORDER — ACETAMINOPHEN 500 MG PO TABS
1000.0000 mg | ORAL_TABLET | Freq: Once | ORAL | Status: AC
  Administered 2022-10-30: 1000 mg via ORAL

## 2022-10-30 MED ORDER — ONDANSETRON HCL 4 MG PO TABS
4.0000 mg | ORAL_TABLET | Freq: Four times a day (QID) | ORAL | Status: DC | PRN
  Filled 2022-10-30: qty 1

## 2022-10-30 MED ORDER — BUPIVACAINE LIPOSOME 1.3 % IJ SUSP
INTRAMUSCULAR | Status: DC | PRN
Start: 2022-10-30 — End: 2022-10-30
  Administered 2022-10-30: 10 mL via PERINEURAL

## 2022-10-30 MED ORDER — ONDANSETRON HCL 4 MG PO TABS
4.0000 mg | ORAL_TABLET | Freq: Three times a day (TID) | ORAL | 0 refills | Status: DC | PRN
  Filled 2022-10-30: qty 10, 4d supply, fill #0

## 2022-10-30 MED ORDER — FENTANYL CITRATE (PF) 100 MCG/2ML IJ SOLN
INTRAMUSCULAR | Status: DC | PRN
  Administered 2022-10-30 (×2): 25 ug via INTRAVENOUS
  Administered 2022-10-30: 50 ug via INTRAVENOUS

## 2022-10-30 MED ORDER — ROCURONIUM BROMIDE 10 MG/ML (PF) SYRINGE
PREFILLED_SYRINGE | INTRAVENOUS | Status: AC
  Filled 2022-10-30: qty 10

## 2022-10-30 MED ORDER — STERILE WATER FOR IRRIGATION IR SOLN
Status: DC | PRN
  Administered 2022-10-30: 2000 mL

## 2022-10-30 MED ORDER — SODIUM CHLORIDE 0.9% FLUSH: 10.0000 mL | Freq: Two times a day (BID) | INTRAVENOUS | Status: DC

## 2022-10-30 MED ORDER — POVIDONE-IODINE 10 % EX SWAB: 2.0000 | Freq: Once | CUTANEOUS | Status: DC

## 2022-10-30 MED ORDER — SUGAMMADEX SODIUM 200 MG/2ML IV SOLN
INTRAVENOUS | Status: DC | PRN
Start: 2022-10-30 — End: 2022-10-30
  Administered 2022-10-30: 200 mg via INTRAVENOUS

## 2022-10-30 MED ORDER — HYDROMORPHONE HCL 1 MG/ML IJ SOLN: 0.2500 mg | INTRAMUSCULAR | Status: DC | PRN

## 2022-10-30 SURGICAL SUPPLY — 58 items
AID PSTN UNV HD RSTRNT DISP (MISCELLANEOUS) ×1
BAG COUNTER SPONGE SURGICOUNT (BAG) IMPLANT
BAG SPEC THK2 15X12 ZIP CLS (MISCELLANEOUS) ×1
BAG SPNG CNTER NS LX DISP (BAG)
BAG ZIPLOCK 12X15 (MISCELLANEOUS) ×1 IMPLANT
BASEPLATE GLENOSPHERE 25 (Plate) IMPLANT
BEARING HUMERAL SHLDER 36M STD (Shoulder) IMPLANT
BIT DRILL QUICK REL 1/8 2PK SL (BIT) IMPLANT
BIT DRILL TWIST 2.7 (BIT) IMPLANT
BLADE SAW SGTL 73X25 THK (BLADE) ×1 IMPLANT
BRNG HUM STD 36 RVRS SHLDR (Shoulder) ×1 IMPLANT
CEMENT BONE DEPUY (Cement) IMPLANT
CLSR STERI-STRIP ANTIMIC 1/2X4 (GAUZE/BANDAGES/DRESSINGS) ×1 IMPLANT
COOLER ICEMAN CLASSIC (MISCELLANEOUS) IMPLANT
COVER BACK TABLE 60X90IN (DRAPES) ×1 IMPLANT
COVER SURGICAL LIGHT HANDLE (MISCELLANEOUS) ×1 IMPLANT
DRAPE ORTHO SPLIT 77X108 STRL (DRAPES) ×2
DRAPE SHEET LG 3/4 BI-LAMINATE (DRAPES) ×2 IMPLANT
DRAPE SURG 17X11 SM STRL (DRAPES) ×1 IMPLANT
DRAPE SURG ORHT 6 SPLT 77X108 (DRAPES) ×2 IMPLANT
DRAPE TOP 10253 STERILE (DRAPES) IMPLANT
DRAPE U-SHAPE 47X51 STRL (DRAPES) ×1 IMPLANT
DRSG MEPILEX POST OP 4X8 (GAUZE/BANDAGES/DRESSINGS) ×1 IMPLANT
DURAPREP 26ML APPLICATOR (WOUND CARE) ×2 IMPLANT
ELECT REM PT RETURN 15FT ADLT (MISCELLANEOUS) ×1 IMPLANT
FACESHIELD WRAPAROUND (MASK) ×1 IMPLANT
FACESHIELD WRAPAROUND OR TEAM (MASK) ×1 IMPLANT
GLENOID SPHERE STD STRL 36MM (Orthopedic Implant) IMPLANT
GLOVE BIO SURGEON STRL SZ 6.5 (GLOVE) ×1 IMPLANT
GLOVE BIOGEL PI IND STRL 7.0 (GLOVE) ×1 IMPLANT
GLOVE BIOGEL PI IND STRL 8 (GLOVE) ×1 IMPLANT
GLOVE ORTHO TXT STRL SZ7.5 (GLOVE) ×1 IMPLANT
GOWN STRL SURGICAL XL XLNG (GOWN DISPOSABLE) ×2 IMPLANT
HOOD PEEL AWAY T7 (MISCELLANEOUS) ×2 IMPLANT
KIT BASIN OR (CUSTOM PROCEDURE TRAY) ×1 IMPLANT
KIT TURNOVER KIT A (KITS) IMPLANT
PACK SHOULDER (CUSTOM PROCEDURE TRAY) ×1 IMPLANT
PIN THREADED REVERSE (PIN) IMPLANT
RESTRAINT HEAD UNIVERSAL NS (MISCELLANEOUS) ×1 IMPLANT
SCREW BONE LOCKING 4.75X30X3.5 (Screw) IMPLANT
SCREW BONE STRL 6.5MMX25MM (Screw) IMPLANT
SCREW LOCKING STRL 4.75X25X3.5 (Screw) IMPLANT
SHOULDER HUMERAL BEAR 36M STD (Shoulder) ×1 IMPLANT
SLING ARM IMMOBILIZER LRG (SOFTGOODS) ×1 IMPLANT
SMARTMIX MINI TOWER (MISCELLANEOUS)
SPIKE FLUID TRANSFER (MISCELLANEOUS) IMPLANT
SPONGE T-LAP 4X18 ~~LOC~~+RFID (SPONGE) IMPLANT
STEM HUMERAL STRL 10MMX55MM (Stem) IMPLANT
SUCTION TUBE FRAZIER 12FR DISP (SUCTIONS) ×1 IMPLANT
SUPPORT WRAP ARM LG (MISCELLANEOUS) ×1 IMPLANT
SUT MAXBRAID #2 CVD NDL (SUTURE) IMPLANT
SUT MAXBRAID #5 CCS-NDL 2PK (SUTURE) IMPLANT
SUT VIC AB 1 CT1 36 (SUTURE) ×1 IMPLANT
SUT VIC AB 2-0 CT1 27 (SUTURE) ×1
SUT VIC AB 2-0 CT1 TAPERPNT 27 (SUTURE) ×1 IMPLANT
TOWEL OR 17X26 10 PK STRL BLUE (TOWEL DISPOSABLE) ×1 IMPLANT
TOWER SMARTMIX MINI (MISCELLANEOUS) IMPLANT
TRAY HUM REV SHOULDER STD +6 (Shoulder) IMPLANT

## 2022-10-30 NOTE — Discharge Instructions (Addendum)
Diet: As you were doing prior to hospitalization  ? ?Shower:  May shower but keep the wounds dry, use an occlusive plastic wrap, NO SOAKING IN TUB.  If the bandage gets wet, change with a clean dry gauze.  If you have a splint on, leave the splint in place and keep the splint dry with a plastic bag. ? ?Dressing:  You may change your dressing 3-5 days after surgery, unless you have a splint.  If you have a splint, then just leave the splint in place and we will change your bandages during your first follow-up appointment.   ? ?If you had hand or foot surgery, we will plan to remove your stitches in about 2 weeks in the office.  For all other surgeries, there are sticky tapes (steri-strips) on your wounds and all the stitches are absorbable.  Leave the steri-strips in place when changing your dressings, they will peel off with time, usually 2-3 weeks. ? ?Activity:  Increase activity slowly as tolerated, but follow the weight bearing instructions below.  The rules on driving is that you can not be taking narcotics while you drive, and you must feel in control of the vehicle.   ? ?Weight Bearing:   no bearing weight with right arm.   ? ?To prevent constipation: you may use a stool softener such as - ? ?Colace (over the counter) 100 mg by mouth twice a day  ?Drink plenty of fluids (prune juice may be helpful) and high fiber foods ?Miralax (over the counter) for constipation as needed.   ? ?Itching:  If you experience itching with your medications, try taking only a single pain pill, or even half a pain pill at a time.  You may take up to 10 pain pills per day, and you can also use benadryl over the counter for itching or also to help with sleep.  ? ?Precautions:  If you experience chest pain or shortness of breath - call 911 immediately for transfer to the hospital emergency department!! ? ?If you develop a fever greater that 101 F, purulent drainage from wound, increased redness or drainage from wound, or calf pain --  Call the office at [phone]                                                ?Follow- Up Appointment:  Please call for an appointment to be seen in 2 weeks [place] - [phone] ? ? ?  ?

## 2022-10-30 NOTE — Anesthesia Procedure Notes (Signed)
Procedure Name: Intubation Date/Time: 10/30/2022 7:38 AM  Performed by: Sampson Goon, CRNAPre-anesthesia Checklist: Patient identified, Emergency Drugs available, Suction available and Patient being monitored Patient Re-evaluated:Patient Re-evaluated prior to induction Oxygen Delivery Method: Circle System Utilized Preoxygenation: Pre-oxygenation with 100% oxygen Induction Type: IV induction Ventilation: Mask ventilation without difficulty Laryngoscope Size: Mac and 3 Grade View: Grade I Tube type: Oral Tube size: 7.0 mm Number of attempts: 1 Airway Equipment and Method: Stylet and Oral airway Placement Confirmation: ETT inserted through vocal cords under direct vision, positive ETCO2 and breath sounds checked- equal and bilateral Secured at: 22 cm Tube secured with: Tape Dental Injury: Teeth and Oropharynx as per pre-operative assessment

## 2022-10-30 NOTE — Op Note (Signed)
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  SECOND Assistant: Roswell Nickel, PA-C  ANESTHESIA:   General with interscalene block using Exparel  ESTIMATED BLOOD LOSS: 100 mL  UNIQUE ASPECTS OF THE CASE: The humeral head was collapsed, and was engaged and locked on the glenoid.  The glenoid itself appeared normal.  I did make 2 head cut, the first 1 looked more like an anatomic cut, the second 1 required +6 offset and restore the anatomy of the proximal humerus fairly well, the soft tissue tension felt appropriate, the subscapularis repair came down to bone although it did not have a lot of laxity, it was the exact length I needed.  PREOPERATIVE INDICATIONS:  Kelly Grant is a  57 y.o. female with a diagnosis of RIGHT SHOULDER OSTEOARTHRITIS who failed conservative measures and elected for surgical management.    The risks benefits and alternatives were discussed with the patient preoperatively including but not limited to the risks of infection, bleeding, nerve injury, cardiopulmonary complications, the need for revision surgery, dislocation, brachial plexus palsy, incomplete relief of pain, among others, and the patient was willing to proceed.  OPERATIVE IMPLANTS:   Implant Name Type Inv. Item Serial No. Manufacturer Lot No. LRB No. Used Action  BASEPLATE GLENOSPHERE 25 - [id number] Plate BASEPLATE GLENOSPHERE 25  [employer](ORTH,TRAU,[employer]) [id number] Right 1 Implanted  SCREW BONE STRL 6.7WGN56OZ - [id number] Screw SCREW BONE STRL 6.5MMX25MM  [employer](ORTH,TRAU,BIO,SG) [id number] Right 1 Implanted   SCREW LOCKING STRL 3.24M01U2.5 - [id number] Screw SCREW LOCKING STRL 4.74Q59D6.3  [employer](ORTH,TRAU,BIO,SG) [id number] Right 1 Implanted  GLENOID SPHERE STD STRL - [id number] Orthopedic Implant GLENOID SPHERE STD STRL  [employer](ORTH,TRAU,BIO,SG) [id number] Right 1 Implanted  SCREW BONE LOCKING 9.32T55D3.5 - [id number] Screw SCREW BONE LOCKING 4.75X30X3.5  [employer](ORTH,TRAU,BIO,SG) [id number] Right 1 Implanted  STEM HUMERAL STRL 10MMX55MM - [id number] Stem STEM HUMERAL STRL 10MMX55MM  [employer](ORTH,TRAU,BIO,SG) [id number] Right 1 Implanted  BRNG HUM STD 36 RVRS SHLDR - [id number] Shoulder BRNG HUM STD 36 RVRS SHLDR  [employer](ORTH,TRAU,BIO,SG) [id number] Right 1 Implanted  TRAY HUM REV SHOULDER STD +6 - [id number] Shoulder TRAY HUM REV SHOULDER STD +6  [employer](ORTH,TRAU,BIO,SG) [id number] Right 1 Implanted    OPERATIVE FINDINGS: Full-thickness supraspinatus tear with atrophic tissue and collapse of the humeral head  OPERATIVE PROCEDURE: The patient was brought to the operating room and placed in the supine position. General anesthesia was administered. IV antibiotics were given.  Time out was performed. The upper extremity was prepped and draped in usual sterile fashion. The patient was in a beachchair position. Deltopectoral approach was carried out. The biceps was tenodesed to the pectoralis tendon with #2 Maxbraid. The subscapularis was released off of the bone.   I then performed circumferential releases of the humerus, and then dislocated the head, and then reamed with the reamer to the above named size.  I then applied the jig, and cut the humeral head in 30 of retroversion, and then turned my attention to the glenoid.  Deep retractors were placed, and I resected the labrum, and then placed a guidepin into the center position on the glenoid, with slight inferior inclination.  I then reamed over the guidepin, and this created a small metaphyseal cancellus blush  inferiorly, removing just the cartilage to the subchondral bone superiorly. The base plate was selected and impacted place, and then I secured it centrally with a nonlocking screw, and I had excellent purchase both inferiorly and superiorly.   I then turned my attention to the glenosphere, and impacted this into place, placing slight inferior offset (set on B).   The glenosphere was completely seated, and had engagement of the [facility] taper. I then turned my attention back to the humerus.  I sequentially broached, and then trialed, and was found to restore soft tissue tension, and it had 2 finger tightness. Therefore the above named components were selected. The shoulder felt stable throughout functional motion.  Before I placed the real prosthesis I had also placed a total of 1 #2 and 2 #5 Maxbraid through the humerus for later subscapularis repair.  I then impacted the real prosthesis into place, as well as the real humeral tray, and reduced the shoulder. The shoulder had excellent motion, and was stable, and I irrigated the wounds copiously.    I then used the Maxbraid suture to repair the subscapularis. This came down to bone.  I then irrigated the shoulder copiously once more, repaired the deltopectoral interval with Vicryl followed by subcutaneous Vicryl with Steri-Strips and sterile gauze for the skin. The patient was awakened and returned back in stable and satisfactory condition. There were no complications and She tolerated the procedure well.

## 2022-10-30 NOTE — Transfer of Care (Signed)
Immediate Anesthesia Transfer of Care Note  Patient: Kelly Grant  Procedure(s) Performed: REVERSE SHOULDER ARTHROPLASTY (Right: Shoulder)  Patient Location: PACU  Anesthesia Type:General and Regional  Level of Consciousness: awake  Airway & Oxygen Therapy: Patient Spontanous Breathing and Patient connected to nasal cannula oxygen  Post-op Assessment: Report given to RN and Post -op Vital signs reviewed and stable  Post vital signs: Reviewed and stable  Last Vitals:  Vitals Value Taken Time  BP 144/77 10/30/22 0945  Temp    Pulse 65 10/30/22 0947  Resp 12 10/30/22 0947  SpO2 98 % 10/30/22 0947  Vitals shown include unfiled device data.  Last Pain:  Vitals:   10/30/22 0550  TempSrc: Oral  PainSc:          Complications: No notable events documented.

## 2022-10-30 NOTE — Evaluation (Signed)
Occupational Therapy Evaluation Patient Details Name: Kelly Grant MRN: [id number] DOB: [birth year] Today's Date: 10/30/2022   History of Present Illness Patient is a 57 year old female who presented with right shoulder osteoarthritis after failed conservative measures. Patient underwent a right reverse total shoulder arthroplasty. PMH: arthritis, MS, left carpal tunnel release   Clinical Impression   Patient is s/p shoulder replacement without functional use of RUE secondary to effects of surgery and interscalene block and shoulder precautions. Therapist provided education and instruction to patient and daughter  in regards to exercises, precautions, positioning, donning upper extremity clothing and bathing while maintaining shoulder precautions, ice and edema management, use of ice machine and donning/doffing sling. Patient and daughter verbalized understanding and demonstrated as needed. Patient needed assistance to donn shirt, underwear, pants, socks and shoes and provided with instruction on compensatory strategies to perform ADLs. Patient to follow up with MD for further therapy needs.         If plan is discharge home, recommend the following: A little help with walking and/or transfers;A little help with bathing/dressing/bathroom;Assistance with cooking/housework;Direct supervision/assist for medications management;Assist for transportation;Help with stairs or ramp for entrance;Direct supervision/assist for financial management    Functional Status Assessment  Patient has had a recent decline in their functional status and demonstrates the ability to make significant improvements in function in a reasonable and predictable amount of time.  Equipment Recommendations  None recommended by OT       Precautions / Restrictions Precautions Precautions: Shoulder Type of Shoulder Precautions: no ROM of shoulder, ok for AROM of hand wrist and shoulder. Shoulder Interventions: Shoulder  sling/immobilizer;At all times;Off for dressing/bathing/exercises Precaution Booklet Issued: Yes (comment) (handouts) Required Braces or Orthoses: Sling Restrictions Weight Bearing Restrictions: Yes RUE Weight Bearing: Non weight bearing      Mobility Bed Mobility               General bed mobility comments: patient was in PACU in recliner          Balance Overall balance assessment: Mild deficits observed, not formally tested           ADL either performed or assessed with clinical judgement   ADL         General ADL Comments: patient reported being able to get clothing on herself with no A prior level. patient needed min A to don pants to pull up on R side with increased time. patient was educate don compensatory strategies to make it easier in next level of care. patient verbalized understanding. patient practiced steps 4 that she will have to navigate at her sisters house this afternoon with supervision and no LOB.     Vision   Vision Assessment?: No apparent visual deficits            Pertinent Vitals/Pain Pain Assessment Pain Assessment: No/denies pain (block still in place)     Extremity/Trunk Assessment Upper Extremity Assessment Upper Extremity Assessment: RUE deficits/detail RUE Deficits / Details: block still in place. patient able to move index finger minimally.   Lower Extremity Assessment Lower Extremity Assessment: Overall WFL for tasks assessed   Cervical / Trunk Assessment Cervical / Trunk Assessment: Normal   Communication Communication Communication: No apparent difficulties   Cognition Arousal: Alert Behavior During Therapy: WFL for tasks assessed/performed Overall Cognitive Status: Within Functional Limits for tasks assessed         General Comments: noted to have breif moments of confusion but over all appropirate. daughter was present during  session as well.           Shoulder Instructions Shoulder  Instructions Donning/doffing shirt without moving shoulder: Minimal assistance;Caregiver independent with task Method for sponge bathing under operated UE: Caregiver independent with task Donning/doffing sling/immobilizer: Caregiver independent with task Correct positioning of sling/immobilizer: Caregiver independent with task ROM for elbow, wrist and digits of operated UE: Modified independent Sling wearing schedule (on at all times/off for ADL's): Caregiver independent with task Proper positioning of operated UE when showering: Caregiver independent with task Positioning of UE while sleeping: Caregiver independent with task    Home Living Family/patient expects to be discharged to:: Private residence Living Arrangements: Children Available Help at Discharge: Family;Available 24 hours/day (going to sisters house) Type of Home: House Home Access: Stairs to enter [employer] of Steps: 4   Home Layout: One level                          Prior Functioning/Environment Prior Level of Function : Independent/Modified Independent                        OT Problem List: Decreased safety awareness;Decreased knowledge of precautions;Pain      OT Treatment/Interventions:      OT Goals(Current goals can be found in the care plan section) Acute Rehab OT Goals OT Goal Formulation: All assessment and education complete, DC therapy  OT Frequency:            End of Session Equipment Utilized During Treatment: Other (comment) (sling) Nurse Communication: Mobility status  Activity Tolerance: Patient tolerated treatment well Patient left: in chair;with call bell/phone within reach;with family/visitor present (in PACU)  OT Visit Diagnosis: Pain                Time: 2130-8657 OT Time Calculation (min): 30 min Charges:  OT General Charges $OT Visit: 1 Visit OT Evaluation $OT Eval Low Complexity: 1 Low OT Treatments $Self Care/Home Management : 8-22  mins  Rosalio Loud, MS Acute Rehabilitation Department Office# [phone]   Selinda Flavin 10/30/2022, 12:38 PM

## 2022-10-30 NOTE — Interval H&P Note (Signed)
History and Physical Interval Note:  10/30/2022 7:16 AM  Kelly Grant  has presented today for surgery, with the diagnosis of RIGHT SHOULDER OSTEOARTHRITIS.  The various methods of treatment have been discussed with the patient and family. After consideration of risks, benefits and other options for treatment, the patient has consented to  Procedure(s): REVERSE SHOULDER ARTHROPLASTY (Right) as a surgical intervention.  The patient's history has been reviewed, patient examined, no change in status, stable for surgery.  I have reviewed the patient's chart and labs.  Questions were answered to the patient's satisfaction.     Eulas Post

## 2022-10-30 NOTE — Anesthesia Procedure Notes (Signed)
Anesthesia Regional Block: Interscalene brachial plexus block   Pre-Anesthetic Checklist: , timeout performed,  Correct Patient, Correct Site, Correct Laterality,  Correct Procedure, Correct Position, site marked,  Risks and benefits discussed,  Pre-op evaluation,  At surgeon's request and post-op pain management  Laterality: Right  Prep: Maximum Sterile Barrier Precautions used, chloraprep       Needles:  Injection technique: Single-shot  Needle Type: Echogenic Stimulator Needle     Needle Length: 5cm  Needle Gauge: 22     Additional Needles:   Procedures:, nerve stimulator,,, ultrasound used (permanent image in chart),,     Nerve Stimulator or Paresthesia:  Response: Biceps response, 0.4 mA  Additional Responses:   Narrative:  Start time: 10/30/2022 6:57 AM End time: 10/30/2022 7:07 AM Injection made incrementally with aspirations every 5 mL.  Performed by: Personally  Anesthesiologist: Gaynelle Adu, MD

## 2022-10-30 NOTE — Anesthesia Postprocedure Evaluation (Signed)
Anesthesia Post Note  Patient: STAVROULA ROHDE  Procedure(s) Performed: REVERSE SHOULDER ARTHROPLASTY (Right: Shoulder)     Patient location during evaluation: PACU Anesthesia Type: General and Regional Level of consciousness: awake and alert Pain management: pain level controlled Vital Signs Assessment: post-procedure vital signs reviewed and stable Respiratory status: spontaneous breathing, nonlabored ventilation and respiratory function stable Cardiovascular status: blood pressure returned to baseline and stable Postop Assessment: no apparent nausea or vomiting Anesthetic complications: no  No notable events documented.  Last Vitals:  Vitals:   10/30/22 1100 10/30/22 1121  BP: (!) 167/89 (!) 147/90  Pulse: 66   Resp: 14   Temp:  (!) 36.4 C  SpO2: 94%     Last Pain:  Vitals:   10/30/22 1121  TempSrc: Oral  PainSc: 0-No pain                 Trellis Guirguis,W. EDMOND

## 2022-10-31 ENCOUNTER — Encounter (HOSPITAL_COMMUNITY): Payer: Self-pay | Admitting: Orthopedic Surgery

## 2022-11-02 DIAGNOSIS — M19011 Primary osteoarthritis, right shoulder: Secondary | ICD-10-CM | POA: Diagnosis not present

## 2022-11-08 DIAGNOSIS — M19011 Primary osteoarthritis, right shoulder: Secondary | ICD-10-CM | POA: Diagnosis not present

## 2022-11-12 DIAGNOSIS — M19011 Primary osteoarthritis, right shoulder: Secondary | ICD-10-CM | POA: Diagnosis not present

## 2022-11-16 ENCOUNTER — Other Ambulatory Visit: Payer: Self-pay

## 2022-11-16 ENCOUNTER — Encounter (HOSPITAL_COMMUNITY): Payer: Self-pay | Admitting: Emergency Medicine

## 2022-11-16 ENCOUNTER — Emergency Department (HOSPITAL_COMMUNITY)
Admission: EM | Admit: 2022-11-16 | Discharge: 2022-11-16 | Disposition: A | Discharge status: Home / Self Care | Payer: BC Managed Care – PPO | Attending: Emergency Medicine | Admitting: Emergency Medicine

## 2022-11-16 DIAGNOSIS — T402X1A Poisoning by other opioids, accidental (unintentional), initial encounter: Secondary | ICD-10-CM | POA: Diagnosis not present

## 2022-11-16 DIAGNOSIS — T40601A Poisoning by unspecified narcotics, accidental (unintentional), initial encounter: Secondary | ICD-10-CM

## 2022-11-16 DIAGNOSIS — I499 Cardiac arrhythmia, unspecified: Secondary | ICD-10-CM | POA: Diagnosis not present

## 2022-11-16 DIAGNOSIS — Z7982 Long term (current) use of aspirin: Secondary | ICD-10-CM | POA: Diagnosis not present

## 2022-11-16 DIAGNOSIS — O479 False labor, unspecified: Secondary | ICD-10-CM | POA: Diagnosis not present

## 2022-11-16 DIAGNOSIS — R404 Transient alteration of awareness: Secondary | ICD-10-CM | POA: Diagnosis not present

## 2022-11-16 DIAGNOSIS — R410 Disorientation, unspecified: Secondary | ICD-10-CM | POA: Diagnosis not present

## 2022-11-16 LAB — BASIC METABOLIC PANEL
Anion gap: 8 (ref 5–15)
BUN: 11 mg/dL (ref 6–20)
CO2: 25 mmol/L (ref 22–32)
Calcium: 9.3 mg/dL (ref 8.9–10.3)
Chloride: 106 mmol/L (ref 98–111)
Creatinine, Ser: 0.73 mg/dL (ref 0.44–1.00)
GFR, Estimated: >60 mL/min (ref >60)
Glucose, Bld: 111 mg/dL — ABNORMAL HIGH (ref 70–99)
Potassium: 3.4 mmol/L — ABNORMAL LOW (ref 3.5–5.1)
Sodium: 139 mmol/L (ref 135–145)

## 2022-11-16 LAB — CBC
HCT: 39.9 % (ref 36.0–46.0)
Hemoglobin: 13 g/dL (ref 12.0–15.0)
MCH: 32.2 pg (ref 26.0–34.0)
MCHC: 32.6 g/dL (ref 30.0–36.0)
MCV: 98.8 fL (ref 80.0–100.0)
Platelets: 408 10*3/uL — ABNORMAL HIGH (ref 150–400)
RBC: 4.04 MIL/uL (ref 3.87–5.11)
RDW: 14 % (ref 11.5–15.5)
WBC: 12.5 10*3/uL — ABNORMAL HIGH (ref 4.0–10.5)
nRBC: 0 % (ref 0.0–0.2)

## 2022-11-16 NOTE — ED Provider Notes (Signed)
[place] EMERGENCY DEPARTMENT AT [facility] Provider Note   CSN: [id number] Arrival date & time: 11/16/22  1444     History  Chief Complaint  Patient presents with   Drug Overdose    Kelly Grant is a 57 y.o. female.   Drug Overdose     Patient has a history of multiple sclerosis arthritis who was recently in the hospital for shoulder arthroplasty on October 8.  Patient states she was at home today and she thought she was taking her muscle relaxants but she may have accidentally taken additional oxycodone.  Family found her unresponsive at the dining table.  EMS was called.  Patient was initially unresponsive.  She was given 1 mg Narcan.  Patient eventually returned back to baseline.  Patient denies that she was trying to harm herself.  She was not trying to take excessive doses.  She thought she was just taking one of her pain and muscle relaxants.  Medications.  Home Medications Prior to Admission medications   Medication Sig Start Date End Date Taking? Authorizing Provider  aspirin EC 81 MG tablet Take 1 tablet (81 mg total) by mouth daily. 02/15/17   Massie Maroon, FNP  baclofen (LIORESAL) 10 MG tablet Take 1 tablet (10 mg total) by mouth 3 (three) times daily. Patient taking differently: Take 10 mg by mouth 2 (two) times daily as needed for muscle spasms. 08/26/22   Carlisle Beers, FNP  baclofen (LIORESAL) 10 MG tablet Take 1 tablet (10 mg total) by mouth 3 (three) times daily as needed for muscle spasm 10/30/22   Janine Ores K, PA-C  Boswellia-Glucosamine-Vit D (OSTEO BI-FLEX ONE PER DAY PO) Take 2 tablets by mouth daily at 6 (six) AM.    [provider]  calcium-vitamin D (OSCAL WITH D) 500-200 MG-UNIT tablet Take 2 tablets by mouth in the morning.    [provider]  carboxymethylcellulose (REFRESH PLUS) 0.5 % SOLN Place 1 drop into both eyes 2 (two) times daily as needed (dry/irritated eyes.).    [provider]   Fingolimod HCl 0.5 MG CAPS TAKE ONE CAPSULE BY MOUTH ONCE DAILY. MAY TAKE WITH OR WITHOUT FOOD. STORE AT ROOM TEMPERATURE. 09/12/22   Glean Salvo, NP  meloxicam (MOBIC) 15 MG tablet Take 15 mg by mouth daily as needed (pain/inflammation.). 09/14/22   [provider]  Multiple Vitamin (MULTIVITAMIN) tablet Take 1 tablet by mouth daily. 04/29/15   Massie Maroon, FNP  omega-3 acid ethyl esters (LOVAZA) 1 g capsule Take 1 capsule (1 g total) by mouth daily. 02/17/20   Barbette Merino, NP  ondansetron (ZOFRAN) 4 MG tablet Take 1 tablet (4 mg total) by mouth every 8 (eight) hours as needed for nausea or vomiting. 10/30/22   Armida Sans, PA-C  oxyCODONE (ROXICODONE) 5 MG immediate release tablet Take 1 tablet (5 mg total) by mouth every 4 (four) hours as needed for severe pain. 10/30/22   Armida Sans, PA-C  Probiotic Product (PROBIOTIC PO) Take 1 capsule by mouth daily at 6 (six) AM.    [provider]  rosuvastatin (CRESTOR) 10 MG tablet TAKE 1 TABLET(10 MG) BY MOUTH AT BEDTIME 09/13/22   Ivonne Andrew, NP  sennosides-docusate sodium (SENOKOT-S) 8.6-50 MG tablet Take 2 tablets by mouth daily. 10/30/22   Janine Ores K, PA-C  valACYclovir (VALTREX) 500 MG tablet TAKE 1 TABLET(500 MG) BY MOUTH DAILY 09/14/22   Ivonne Andrew, NP  Wheat Dextrin (BENEFIBER DRINK  MIX PO) Take 1 Dose by mouth in the morning and at bedtime.    [provider]      Allergies    Macrobid [nitrofurantoin macrocrystal]    Review of Systems   Review of Systems  Physical Exam Updated Vital Signs BP (!) 159/99   Pulse 96   Temp 98.6 F (37 C) (Oral)   Resp 15   SpO2 98%  Physical Exam Vitals and nursing note reviewed.  Constitutional:      General: She is not in acute distress.    Appearance: She is well-developed. She is not diaphoretic.  HENT:     Head: Normocephalic and atraumatic.     Right Ear: External ear normal.     Left Ear: External ear normal.  Eyes:     General: No  scleral icterus.       Right eye: No discharge.        Left eye: No discharge.     Conjunctiva/sclera: Conjunctivae normal.  Neck:     Trachea: No tracheal deviation.  Cardiovascular:     Rate and Rhythm: Normal rate.  Pulmonary:     Effort: Pulmonary effort is normal. No respiratory distress.     Breath sounds: No stridor.  Abdominal:     General: There is no distension.  Musculoskeletal:        General: No swelling or deformity.     Cervical back: Neck supple.  Skin:    General: Skin is warm and dry.     Findings: No rash.  Neurological:     Mental Status: She is alert and oriented to person, place, and time. Mental status is at baseline.     Cranial Nerves: No dysarthria or facial asymmetry.     Motor: No weakness or seizure activity.     ED Results / Procedures / Treatments   Labs (all labs ordered are listed, but only abnormal results are displayed) Labs Reviewed  CBC - Abnormal; Notable for the following components:      Result Value   WBC 12.5 (*)    Platelets 408 (*)    All other components within normal limits  BASIC METABOLIC PANEL - Abnormal; Notable for the following components:   Potassium 3.4 (*)    Glucose, Bld 111 (*)    All other components within normal limits    EKG None  Radiology No results found.  Procedures Procedures    Medications Ordered in ED Medications - No data to display  ED Course/ Medical Decision Making/ A&P Clinical Course as of 11/16/22 1700  Fri Nov 16, 2022  1644 CBC normal.  Metabolic panel normal [JK]    Clinical Course User Index [JK] Linwood Dibbles, MD                                 Medical Decision Making Problems Addressed: Opiate overdose, accidental or unintentional, initial encounter [facility]): acute illness or injury that poses a threat to life or bodily functions  Amount and/or Complexity of Data Reviewed Labs: ordered. Decision-making details documented in ED Course.   Patient presented to the ED for  evaluation after an accidental oxycodone overdose.  Patient has been taking it after her recent shoulder surgery.  Patient thinks she might have accidentally taken an additional oxycodone instead of her muscle relaxant.  Patient was given Narcan and her symptoms resolved.  In the ED she has remained alert no  distress.  The patient denies any intent to harm herself.  Significant lab abnormalities noted.  Patient was monitored for couple of hours and she did not have any recurrent symptoms.  At this point she appears stable for discharge and her family's care.        Final Clinical Impression(s) / ED Diagnoses Final diagnoses:  Opiate overdose, accidental or unintentional, initial encounter [facility])    Rx / DC Orders ED Discharge Orders     None         Linwood Dibbles, MD 11/16/22 1700

## 2022-11-16 NOTE — Discharge Instructions (Signed)
Avoid taking oxycodone medication.  Return to the ED if you have any recurrent episodes.  Follow-up with orthopedic doctor to be rechecked

## 2022-11-16 NOTE — ED Triage Notes (Signed)
Pt BIB EMS from home after taking unknown amount of oxycodone obtained after recent shoulder surgery. Unrousable at the scene, 1 mg narcan, disoriented to situation and time. Oxy pill bottle at bedside.  BP 154/95 P 116 SpO2 95% CBG 138

## 2022-11-20 ENCOUNTER — Other Ambulatory Visit (HOSPITAL_COMMUNITY): Payer: Self-pay

## 2022-11-21 ENCOUNTER — Encounter: Payer: Self-pay | Admitting: Physical Therapy

## 2022-11-21 ENCOUNTER — Ambulatory Visit: Payer: BC Managed Care – PPO | Attending: Orthopedic Surgery | Admitting: Physical Therapy

## 2022-11-21 ENCOUNTER — Other Ambulatory Visit: Payer: Self-pay

## 2022-11-21 DIAGNOSIS — M25511 Pain in right shoulder: Secondary | ICD-10-CM | POA: Insufficient documentation

## 2022-11-21 DIAGNOSIS — M6281 Muscle weakness (generalized): Secondary | ICD-10-CM | POA: Insufficient documentation

## 2022-11-21 DIAGNOSIS — M25611 Stiffness of right shoulder, not elsewhere classified: Secondary | ICD-10-CM | POA: Insufficient documentation

## 2022-11-21 NOTE — Therapy (Signed)
OUTPATIENT PHYSICAL THERAPY SHOULDER EVALUATION   Patient Name: Kelly Grant MRN: [id number] DOB:1965-10-30, 57 y.o., female Today's Date: 11/21/2022  END OF SESSION:  PT End of Session - 11/21/22 1320     Visit Number 1    Number of Visits 17    Date for PT Re-Evaluation 01/16/23    Authorization Type [employer]    PT Start Time 1320   late check in   PT Stop Time 1403    PT Time Calculation (min) 43 min    Activity Tolerance Patient tolerated treatment well;No increased pain    Behavior During Therapy WFL for tasks assessed/performed             Past Medical History:  Diagnosis Date   Abnormal Pap smear    ASC-H   Arthritis    Chlamydia    History of chicken pox    HSV infection    Multiple sclerosis (HCC)    Neuromuscular disorder (HCC)    MS   Yeast infection    Past Surgical History:  Procedure Laterality Date   CARPAL TUNNEL RELEASE Left    in addition to wrist fracture surgery   CESAREAN SECTION     x2   COLONOSCOPY  2016   DJ-MAC-movi(exc)-TA   INCISION AND DRAINAGE ABSCESS Left    left buttock   POLYPECTOMY  2016   TA   REVERSE SHOULDER ARTHROPLASTY Right 10/30/2022   Procedure: REVERSE SHOULDER ARTHROPLASTY;  Surgeon: Teryl Lucy, MD;  Location: [facility] ORS;  Service: Orthopedics;  Laterality: Right;   TUBAL LIGATION     Patient Active Problem List   Diagnosis Date Noted   Diabetes mellitus screening 10/01/2022   ASCUS with positive high risk HPV cervical 05/07/2022   Hyperlipidemia 09/29/2021   Encounter for therapeutic drug monitoring 12/13/2014   Multiple sclerosis (HCC) 05/08/2012   HSV infection 06/22/2011    PCP: Ivonne Andrew, NP  REFERRING PROVIDER: Teryl Lucy, MD  REFERRING DIAG: S/P Right Reverse Total Shoulder Arthroplasty DOS 10/30/2022  THERAPY DIAG:  Right shoulder pain, unspecified chronicity  Stiffness of right shoulder, not elsewhere classified  Muscle weakness (generalized)  Rationale for Evaluation and  Treatment: Rehabilitation  ONSET DATE: s/p rTSA 10/30/22  SUBJECTIVE:                                                                                                                                                                                      SUBJECTIVE STATEMENT: Pt reports ongoing shoulder pain since August 2024 which led to rTSA on 10/30/22. Since surgery has been using sling most of the time although she states at recent follow up with surgeon she  was cleared to be out of it while sitting. Has been doing elbow/hand exercises. States she is able to do majority of self care tasks without assist but does have increased difficulty and is avoiding liting/heavier tasks as expected post op.  Hand dominance: Right  PERTINENT HISTORY: MS, rSTA 10/30/22  PAIN:  Are you having pain: no pain Location/description: lateral/superior shoulder R  Best-worst over past week: 0-5/10, calms quickly   - aggravating factors: general activity, difficulty describing otherwise - Easing factors: rest, sling, medication  PRECAUTIONS: R shoulder reverse TSA 2 weeks post op: 11/13/22 4 weeks: 11/27/22 6 weeks: 12/11/22 8 weeks: 12/25/22  WEIGHT BEARING RESTRICTIONS: Yes assumed NWB on surgical limb  FALLS:  Has patient fallen in last 6 months? No  LIVING ENVIRONMENT: Third floor apartment with three flights of stairs 97 year old son lives with her  OCCUPATION: Working prior to surgery - operator/lead on packing line at [employer]; lots of time up on feet, has to lift fairly heavy items at times   PLOF: Independent  PATIENT GOALS:  be able to use shoulder more again  NEXT MD VISIT: November 18th   OBJECTIVE:  Note: Objective measures were completed at Evaluation unless otherwise noted.  DIAGNOSTIC FINDINGS:  S/p rTSA 10/30/22  PATIENT SURVEYS:  FOTO 34 current, 63 predicted  COGNITION: Overall cognitive status: Within functional limits for tasks assessed     SENSATION: No sensory  complaints, not formally tested  POSTURE: Guarded posture RUE as expected, mild forward head and increased thoracic kyphosis  UPPER EXTREMITY ROM:  A/PROM Right eval Left eval  Shoulder flexion P: ~60 deg limited by muscle guarding    Shoulder abduction P: ~70 deg   Shoulder internal rotation NT   Shoulder external rotation NT   Elbow flexion    Elbow extension    Wrist flexion    Wrist extension     (Blank rows = not tested) (Key: WFL = within functional limits not formally assessed, * = concordant pain, s = stiffness/stretching sensation, NT = not tested)  Comments: pt denies pain with ROM  UPPER EXTREMITY MMT:  MMT Right eval Left eval  Shoulder flexion    Shoulder extension    Shoulder abduction    Shoulder extension    Shoulder internal rotation    Shoulder external rotation    Elbow flexion    Elbow extension    Grip strength    (Blank rows = not tested)  (Key: WFL = within functional limits not formally assessed, * = concordant pain, s = stiffness/stretching sensation, NT = not tested)  Comments: deferred on eval given acuity of surgery  SHOULDER SPECIAL TESTS: Deferred given acuity of surgery  JOINT MOBILITY TESTING:  Deferred given acuity of surgery  PALPATION:  Deferred - visual inspection of incision covered in steristrips, no apparent erythema or swelling, grossly WNL   TODAY'S TREATMENT:  [facility] Adult PT Treatment:                                                DATE: 11/21/22 Therapeutic Exercise: Fwd pendulums x8 with table support, education/cues for appropriate setup Scapular retractions (emphasis on avoiding extension at elbow) x8 HEP handout + education, emphasis on appropriate mechanics, safety w/ movement, and shoulder precautions Manual Therapy: PROM abduction/flexion within pt tolerance    PATIENT  EDUCATION: Education details: Pt education on PT impairments, prognosis, and POC. Informed consent. Rationale for interventions, safe/appropriate HEP performance Person educated: Patient Education method: Explanation, Demonstration, Tactile cues, Verbal cues, and Handouts Education comprehension: verbalized understanding, returned demonstration, verbal cues required, tactile cues required, and needs further education    HOME EXERCISE PROGRAM: Access Code: [email or web address] URL: [email or web address] Date: 11/21/2022 Prepared by: Fransisco Hertz  Program Notes - please ensure with scapular retraction elbow does not go into extension (keep tucked at side, may perform in sling to help limit)  Exercises - Flexion-Extension Shoulder Pendulum with Table Support  - 2-3 x daily - 1 sets - 10 reps - Seated Scapular Retraction  - 2-3 x daily - 1 sets - 8 reps - Seated Gripping Towel  - 2-3 x daily - 1 sets - 10 reps  ASSESSMENT:  CLINICAL IMPRESSION: Patient is a 57 y.o. woman who was seen today for physical therapy evaluation and treatment for R shoulder pain s/p rTSA 10/30/22. Pt endorses difficulty with daily activities and self care as expected postoperatively. Exam limited to PROM today given proximity to surgery, measurements as above. Initiation of HEP with emphasis on appropriate mechanics within pt precautions, does well with avoiding shoulder extension with scapular retraction. Tolerates session quite well overall. No adverse events, pt reports no pain on departure. Recommend skilled PT to address aforementioned deficits with aim of improving functional tolerance and reducing pain with typical activities. Pt departs today's session in no acute distress, all voiced concerns/questions addressed appropriately from PT perspective.      OBJECTIVE IMPAIRMENTS: decreased activity tolerance, decreased endurance, decreased mobility, decreased ROM, decreased strength, impaired UE functional use,  postural dysfunction, and pain.   ACTIVITY LIMITATIONS: carrying, lifting, sleeping, bathing, reach over head, and hygiene/grooming  PARTICIPATION LIMITATIONS: meal prep, cleaning, laundry, driving, community activity, and occupation  PERSONAL FACTORS: Time since onset of injury/illness/exacerbation and 1 comorbidity: MS  are also affecting patient's functional outcome.   REHAB POTENTIAL: Good  CLINICAL DECISION MAKING: Stable/uncomplicated  EVALUATION COMPLEXITY: Low   GOALS: Goals reviewed with patient? Yes  SHORT TERM GOALS: Target date: 12/19/2022 Pt will demonstrate appropriate understanding and performance of initially prescribed HEP in order to facilitate improved independence with management of symptoms.  Baseline: HEP provided on eval Goal status: INITIAL   2. Pt will score greater than or equal to 49 on FOTO in order to demonstrate improved perception of function due to symptoms.  Baseline: 34  Goal status: INITIAL    LONG TERM GOALS: Target date: 01/16/2023 Pt will score 63 on FOTO in order to demonstrate improved perception of function due to symptoms. Baseline: 34 Goal status: INITIAL  2.  Pt will demonstrate at least 140 degrees of active shoulder elevation in order to demonstrate improved tolerance to functional movement patterns such as overhead reaching and upper body dressing.  Baseline: see ROM chart above - PROM only on eval Goal status:  INITIAL  3.  Pt will demonstrate at least 4+/5 shoulder flex/abduction MMT for improved symmetry of UE strength and improved tolerance to functional movements.  Baseline: deferred on eval given proximity to surgery Goal status: INITIAL  4. Pt will report/demonstrate ability to perform upper body dressing with less than 2 point increase in pain on NPS in order to indicate improved tolerance/independence with ADLs.  Baseline: increased difficulty/modification required  Goal status: INITIAL   5. Pt will report at least 50%  decrease in overall pain levels in past week in order to facilitate improved tolerance to basic ADLs/mobility.   Baseline: 0-5/10  Goal status: INITIAL    6. Pt will demonstrate appropriate performance of final prescribed HEP in order to facilitate improved self-management of symptoms post-discharge.   Baseline: initial HEP prescribed  Goal status: INITIAL    PLAN:  PT FREQUENCY: 2x/week  PT DURATION: 8 weeks  PLANNED INTERVENTIONS: 97164- PT Re-evaluation, 97110-Therapeutic exercises, 97530- Therapeutic activity, 97112- Neuromuscular re-education, 97535- Self Care, 16109- Manual therapy, Patient/Family education, Balance training, Taping, Dry Needling, Joint mobilization, Spinal mobilization, Scar mobilization, Cryotherapy, and Moist heat  PLAN FOR NEXT SESSION: Review/update HEP PRN. Symptom modification strategies as indicated/appropriate. No extension/IR. PROM->AAROM phase 1 per Delbert Harness protocol.    Ashley Murrain PT, DPT 11/21/2022 3:50 PM

## 2022-11-26 ENCOUNTER — Other Ambulatory Visit (HOSPITAL_COMMUNITY): Payer: Self-pay

## 2022-11-26 NOTE — Therapy (Signed)
OUTPATIENT PHYSICAL THERAPY NOTE   Patient Name: Kelly Grant MRN: [id number] DOB:1965/04/09, 57 y.o., female Today's Date: 11/27/2022  END OF SESSION:  PT End of Session - 11/27/22 0846     Visit Number 2    Number of Visits 17    Date for PT Re-Evaluation 01/16/23    Authorization Type [employer]    PT Start Time 0847    PT Stop Time 0926    PT Time Calculation (min) 39 min    Activity Tolerance Patient tolerated treatment well;No increased pain    Behavior During Therapy WFL for tasks assessed/performed              Past Medical History:  Diagnosis Date   Abnormal Pap smear    ASC-H   Arthritis    Chlamydia    History of chicken pox    HSV infection    Multiple sclerosis (HCC)    Neuromuscular disorder (HCC)    MS   Yeast infection    Past Surgical History:  Procedure Laterality Date   CARPAL TUNNEL RELEASE Left    in addition to wrist fracture surgery   CESAREAN SECTION     x2   COLONOSCOPY  2016   DJ-MAC-movi(exc)-TA   INCISION AND DRAINAGE ABSCESS Left    left buttock   POLYPECTOMY  2016   TA   REVERSE SHOULDER ARTHROPLASTY Right 10/30/2022   Procedure: REVERSE SHOULDER ARTHROPLASTY;  Surgeon: Teryl Lucy, MD;  Location: [facility];  Service: Orthopedics;  Laterality: Right;   TUBAL LIGATION     Patient Active Problem List   Diagnosis Date Noted   Diabetes mellitus screening 10/01/2022   ASCUS with positive high risk HPV cervical 05/07/2022   Hyperlipidemia 09/29/2021   Encounter for therapeutic drug monitoring 12/13/2014   Multiple sclerosis (HCC) 05/08/2012   HSV infection 06/22/2011    PCP: Ivonne Andrew, NP  REFERRING PROVIDER: Teryl Lucy, MD  REFERRING DIAG: S/P Right Reverse Total Shoulder Arthroplasty DOS 10/30/2022  THERAPY DIAG:  Right shoulder pain, unspecified chronicity  Stiffness of right shoulder, not elsewhere classified  Muscle weakness (generalized)  Rationale for Evaluation and Treatment: Rehabilitation  ONSET  DATE: s/p rTSA 10/30/22  SUBJECTIVE:                                                                                                                                                                                     Per eval - Pt reports ongoing shoulder pain since August 2024 which led to rTSA on 10/30/22. Since surgery has been using sling most of the time although she states at recent follow up with surgeon she was cleared to be  out of it while sitting. Has been doing elbow/hand exercises. States she is able to do majority of self care tasks without assist but does have increased difficulty and is avoiding liting/heavier tasks as expected post op.  Hand dominance: Right  SUBJECTIVE STATEMENT: 11/27/2022 Pt arrives w/o pain, denies soreness or increase in pain after eval. Reports some performance of HEP at home, no issues.   PERTINENT HISTORY: MS, rSTA 10/30/22  PAIN:  Are you having pain: no pain Location/description: lateral/superior shoulder R  Best-worst over past week: 0-5/10, calms quickly   - aggravating factors: general activity, difficulty describing otherwise - Easing factors: rest, sling, medication  PRECAUTIONS: R shoulder reverse TSA 2 weeks post op: 11/13/22 4 weeks: 11/27/22 6 weeks: 12/11/22 8 weeks: 12/25/22  WEIGHT BEARING RESTRICTIONS: Yes assumed NWB on surgical limb  FALLS:  Has patient fallen in last 6 months? No  LIVING ENVIRONMENT: Third floor apartment with three flights of stairs 51 year old son lives with her  OCCUPATION: Working prior to surgery - operator/lead on packing line at [employer]; lots of time up on feet, has to lift fairly heavy items at times   PLOF: Independent  PATIENT GOALS:  be able to use shoulder more again  NEXT MD VISIT: November 18th   OBJECTIVE:  Note: Objective measures were completed at Evaluation unless otherwise noted.  DIAGNOSTIC FINDINGS:  S/p rTSA 10/30/22  PATIENT SURVEYS:  FOTO 34 current, 63  predicted  COGNITION: Overall cognitive status: Within functional limits for tasks assessed     SENSATION: No sensory complaints, not formally tested  POSTURE: Guarded posture RUE as expected, mild forward head and increased thoracic kyphosis  UPPER EXTREMITY ROM:  A/PROM Right eval Left eval Right 11/27/22  Shoulder flexion P: ~60 deg limited by muscle guarding   P: ~80 deg  Shoulder abduction P: ~70 deg  P: ~80 deg  Shoulder internal rotation NT    Shoulder external rotation NT    Elbow flexion     Elbow extension     Wrist flexion     Wrist extension      (Blank rows = not tested) (Key: WFL = within functional limits not formally assessed, * = concordant pain, s = stiffness/stretching sensation, NT = not tested)  Comments: pt denies pain with ROM  UPPER EXTREMITY MMT:  MMT Right eval Left eval  Shoulder flexion    Shoulder extension    Shoulder abduction    Shoulder extension    Shoulder internal rotation    Shoulder external rotation    Elbow flexion    Elbow extension    Grip strength    (Blank rows = not tested)  (Key: WFL = within functional limits not formally assessed, * = concordant pain, s = stiffness/stretching sensation, NT = not tested)  Comments: deferred on eval given acuity of surgery  SHOULDER SPECIAL TESTS: Deferred given acuity of surgery  JOINT MOBILITY TESTING:  Deferred given acuity of surgery  PALPATION:  Deferred - visual inspection of incision covered in steristrips, no apparent erythema or swelling, grossly WNL   TODAY'S TREATMENT:  [facility] Adult PT Treatment:                                                DATE: 11/27/22 Therapeutic Exercise: Standing pendulums fwd, RUE, 2x10 cues for improved relaxation Seated passive fwd flexion (slideboard and towel) 2x8 cues for comfortable ROM, reduced UT  compensations Seated passive scaption (slideboard and towel) x8 cues for setup, comfortable positioning and ROM Standing elbow flex/ext x12 emphasis on full ROM  Standing scapular retraction 2x12 (sling donned to assist with avoiding shoulder extension) HEP update + education on safe/appropriate performance, handout  Manual Therapy: Supine; passive physiological movement R shoulder  abduction/flexion to pt tolerance    [facility] Adult PT Treatment:                                                DATE: 11/21/22 Therapeutic Exercise: Fwd pendulums x8 with table support, education/cues for appropriate setup Scapular retractions (emphasis on avoiding extension at elbow) x8 HEP handout + education, emphasis on appropriate mechanics, safety w/ movement, and shoulder precautions Manual Therapy: PROM abduction/flexion within pt tolerance    PATIENT EDUCATION: Education details: rationale for interventions, HEP  Person educated: Patient Education method: Explanation, Demonstration, Tactile cues, Verbal cues Education comprehension: verbalized understanding, returned demonstration, verbal cues required, tactile cues required, and needs further education    HOME EXERCISE PROGRAM: Access Code: [email or web address] URL: [email or web address] Date: 11/27/2022 Prepared by: Fransisco Hertz  Program Notes - please ensure with scapular retraction elbow does not go into extension (keep tucked at side, may perform in sling to help limit)  Exercises - Flexion-Extension Shoulder Pendulum with Table Support  - 2-3 x daily - 1 sets - 10 reps - Seated Scapular Retraction  - 2-3 x daily - 1 sets - 8 reps - Seated Gripping Towel  - 2-3 x daily - 1 sets - 10 reps - Seated Shoulder Flexion Towel Slide at Table Top  - 2-3 x daily - 1 sets - 8 reps  ASSESSMENT:  CLINICAL IMPRESSION: 11/27/2022 Pt arrives w/o pain, no issues since initial eval. Today focusing on improving passive mobility within pt tolerance  which she does well with, cues as above. Pt denies any increases in pain throughout session, no adverse events. HEP update as above, education on safe/appropriate setup. Pt continues with post op deficits as expected but does demonstrate improved ROM compared to eval. Recommend continuing along current POC in order to address relevant deficits and improve functional tolerance. Pt departs today's session in no acute distress, all voiced questions/concerns addressed appropriately from PT perspective.    Per eval - Patient is a 57 y.o. woman who was seen today for physical therapy evaluation and treatment for R shoulder pain s/p rTSA 10/30/22. Pt endorses difficulty with daily activities and self care as expected postoperatively. Exam limited to PROM today given proximity to surgery, measurements as above. Initiation of HEP with emphasis on appropriate mechanics within pt precautions, does well with avoiding shoulder extension with scapular retraction. Tolerates session quite well overall. No adverse events, pt reports no pain on departure. Recommend skilled PT to address aforementioned deficits with aim of improving functional tolerance and reducing pain with typical activities. Pt departs today's session in no acute distress,  all voiced concerns/questions addressed appropriately from PT perspective.      OBJECTIVE IMPAIRMENTS: decreased activity tolerance, decreased endurance, decreased mobility, decreased ROM, decreased strength, impaired UE functional use, postural dysfunction, and pain.   ACTIVITY LIMITATIONS: carrying, lifting, sleeping, bathing, reach over head, and hygiene/grooming  PARTICIPATION LIMITATIONS: meal prep, cleaning, laundry, driving, community activity, and occupation  PERSONAL FACTORS: Time since onset of injury/illness/exacerbation and 1 comorbidity: MS  are also affecting patient's functional outcome.   REHAB POTENTIAL: Good  CLINICAL DECISION MAKING:  Stable/uncomplicated  EVALUATION COMPLEXITY: Low   GOALS: Goals reviewed with patient? Yes  SHORT TERM GOALS: Target date: 12/19/2022 Pt will demonstrate appropriate understanding and performance of initially prescribed HEP in order to facilitate improved independence with management of symptoms.  Baseline: HEP provided on eval Goal status: INITIAL   2. Pt will score greater than or equal to 49 on FOTO in order to demonstrate improved perception of function due to symptoms.  Baseline: 34  Goal status: INITIAL    LONG TERM GOALS: Target date: 01/16/2023 Pt will score 63 on FOTO in order to demonstrate improved perception of function due to symptoms. Baseline: 34 Goal status: INITIAL  2.  Pt will demonstrate at least 140 degrees of active shoulder elevation in order to demonstrate improved tolerance to functional movement patterns such as overhead reaching and upper body dressing.  Baseline: see ROM chart above - PROM only on eval Goal status: INITIAL  3.  Pt will demonstrate at least 4+/5 shoulder flex/abduction MMT for improved symmetry of UE strength and improved tolerance to functional movements.  Baseline: deferred on eval given proximity to surgery Goal status: INITIAL  4. Pt will report/demonstrate ability to perform upper body dressing with less than 2 point increase in pain on NPS in order to indicate improved tolerance/independence with ADLs.  Baseline: increased difficulty/modification required  Goal status: INITIAL   5. Pt will report at least 50% decrease in overall pain levels in past week in order to facilitate improved tolerance to basic ADLs/mobility.   Baseline: 0-5/10  Goal status: INITIAL    6. Pt will demonstrate appropriate performance of final prescribed HEP in order to facilitate improved self-management of symptoms post-discharge.   Baseline: initial HEP prescribed  Goal status: INITIAL    PLAN:  PT FREQUENCY: 2x/week  PT DURATION: 8  weeks  PLANNED INTERVENTIONS: 97164- PT Re-evaluation, 97110-Therapeutic exercises, 97530- Therapeutic activity, 97112- Neuromuscular re-education, 97535- Self Care, 16109- Manual therapy, Patient/Family education, Balance training, Taping, Dry Needling, Joint mobilization, Spinal mobilization, Scar mobilization, Cryotherapy, and Moist heat  PLAN FOR NEXT SESSION: Review/update HEP PRN. Symptom modification strategies as indicated/appropriate. No extension/IR. PROM->AAROM phase 1 per [facility] protocol.     Ashley Murrain PT, DPT 11/27/2022 9:30 AM

## 2022-11-27 ENCOUNTER — Encounter: Payer: Self-pay | Admitting: Physical Therapy

## 2022-11-27 ENCOUNTER — Ambulatory Visit: Payer: BC Managed Care – PPO | Attending: Orthopedic Surgery | Admitting: Physical Therapy

## 2022-11-27 DIAGNOSIS — M25611 Stiffness of right shoulder, not elsewhere classified: Secondary | ICD-10-CM | POA: Insufficient documentation

## 2022-11-27 DIAGNOSIS — M25511 Pain in right shoulder: Secondary | ICD-10-CM | POA: Diagnosis present

## 2022-11-27 DIAGNOSIS — M6281 Muscle weakness (generalized): Secondary | ICD-10-CM | POA: Diagnosis present

## 2022-11-29 NOTE — Therapy (Signed)
OUTPATIENT PHYSICAL THERAPY NOTE   Patient Name: Kelly Grant MRN: [id number] DOB:[birth year], 57 y.o., female Today's Date: 11/30/2022  END OF SESSION:  PT End of Session - 11/30/22 1011     Visit Number 3    Number of Visits 17    Date for PT Re-Evaluation 01/16/23    Authorization Type [employer]    PT Start Time 1013    PT Stop Time 1059    PT Time Calculation (min) 46 min    Activity Tolerance Patient tolerated treatment well;No increased pain    Behavior During Therapy WFL for tasks assessed/performed               Past Medical History:  Diagnosis Date   Abnormal Pap smear    ASC-H   Arthritis    Chlamydia    History of chicken pox    HSV infection    Multiple sclerosis (HCC)    Neuromuscular disorder (HCC)    MS   Yeast infection    Past Surgical History:  Procedure Laterality Date   CARPAL TUNNEL RELEASE Left    in addition to wrist fracture surgery   CESAREAN SECTION     x2   COLONOSCOPY  2016   DJ-MAC-movi(exc)-TA   INCISION AND DRAINAGE ABSCESS Left    left buttock   POLYPECTOMY  2016   TA   REVERSE SHOULDER ARTHROPLASTY Right 10/30/2022   Procedure: REVERSE SHOULDER ARTHROPLASTY;  Surgeon: Teryl Lucy, MD;  Location: [facility];  Service: Orthopedics;  Laterality: Right;   TUBAL LIGATION     Patient Active Problem List   Diagnosis Date Noted   Diabetes mellitus screening 10/01/2022   ASCUS with positive high risk HPV cervical 05/07/2022   Hyperlipidemia 09/29/2021   Encounter for therapeutic drug monitoring 12/13/2014   Multiple sclerosis (HCC) 05/08/2012   HSV infection 06/22/2011    PCP: Ivonne Andrew, NP  REFERRING PROVIDER: Teryl Lucy, MD  REFERRING DIAG: S/P Right Reverse Total Shoulder Arthroplasty DOS 10/30/2022  THERAPY DIAG:  Right shoulder pain, unspecified chronicity  Stiffness of right shoulder, not elsewhere classified  Muscle weakness (generalized)  Rationale for Evaluation and Treatment: Rehabilitation  ONSET  DATE: s/p rTSA 10/30/22  SUBJECTIVE:                                                                                                                                                                                     Per eval - Pt reports ongoing shoulder pain since August 2024 which led to rTSA on 10/30/22. Since surgery has been using sling most of the time although she states at recent follow up with surgeon she was cleared to  be out of it while sitting. Has been doing elbow/hand exercises. States she is able to do majority of self care tasks without assist but does have increased difficulty and is avoiding liting/heavier tasks as expected post op.  Hand dominance: Right  SUBJECTIVE STATEMENT: 11/30/2022 Pt reports some posterior shoulder soreness/aching at times, denies any issues after last session. No pain at present, arrives with sling. Has been doing HEP at home, describes some discomfort with passive flexion but not worsening, eases with rest.     PERTINENT HISTORY: MS, rSTA 10/30/22  PAIN:  Are you having pain: no pain Location/description: lateral/superior shoulder R  Best-worst over past week: 0-5/10, calms quickly   - aggravating factors: general activity, difficulty describing otherwise - Easing factors: rest, sling, medication  PRECAUTIONS: R shoulder reverse TSA 2 weeks post op: 11/13/22 4 weeks: 11/27/22 6 weeks: 12/11/22 8 weeks: 12/25/22  WEIGHT BEARING RESTRICTIONS: Yes assumed NWB on surgical limb  FALLS:  Has patient fallen in last 6 months? No  LIVING ENVIRONMENT: Third floor apartment with three flights of stairs 3 year old son lives with her  OCCUPATION: Working prior to surgery - operator/lead on packing line at [employer]; lots of time up on feet, has to lift fairly heavy items at times   PLOF: Independent  PATIENT GOALS:  be able to use shoulder more again  NEXT MD VISIT: November 18th   OBJECTIVE:  Note: Objective measures were completed at Evaluation  unless otherwise noted.  DIAGNOSTIC FINDINGS:  S/p rTSA 10/30/22  PATIENT SURVEYS:  FOTO 34 current, 63 predicted  COGNITION: Overall cognitive status: Within functional limits for tasks assessed     SENSATION: No sensory complaints, not formally tested  POSTURE: Guarded posture RUE as expected, mild forward head and increased thoracic kyphosis  UPPER EXTREMITY ROM:  A/PROM Right eval Left eval Right 11/27/22 Right 11/30/22  Shoulder flexion P: ~60 deg limited by muscle guarding   P: ~80 deg P: 96 deg painless with towel slides at table  Shoulder abduction P: ~70 deg  P: ~80 deg   Shoulder internal rotation NT     Shoulder external rotation NT     Elbow flexion      Elbow extension      Wrist flexion      Wrist extension       (Blank rows = not tested) (Key: WFL = within functional limits not formally assessed, * = concordant pain, s = stiffness/stretching sensation, NT = not tested)  Comments: pt denies pain with ROM  UPPER EXTREMITY MMT:  MMT Right eval Left eval  Shoulder flexion    Shoulder extension    Shoulder abduction    Shoulder extension    Shoulder internal rotation    Shoulder external rotation    Elbow flexion    Elbow extension    Grip strength    (Blank rows = not tested)  (Key: WFL = within functional limits not formally assessed, * = concordant pain, s = stiffness/stretching sensation, NT = not tested)  Comments: deferred on eval given acuity of surgery  SHOULDER SPECIAL TESTS: Deferred given acuity of surgery  JOINT MOBILITY TESTING:  Deferred given acuity of surgery  PALPATION:  Deferred - visual inspection of incision covered in steristrips, no apparent erythema or swelling, grossly WNL   TODAY'S TREATMENT:  [facility] Adult PT Treatment:                                                DATE: 11/30/22 Therapeutic  Exercise: Seated passive fwd flexion (board and towel) 3x8 cues for reduced UT compensations, comfortable ROM  Scapular retraction outside of sling, x15 cues to avoid shoulder extension, tactile cues to prevent  Scaption passive with board and towel, seated; 2x8  Verbal HEP review/education  Therapeutic Activity: Supine; passive physiological movement  R shoulder into flex, abd, and ER to pt tolerance (ER only to neutral due to stiffness). Gentle oscillations to reduce muscle guarding   [facility] Adult PT Treatment:                                                DATE: 11/27/22 Therapeutic Exercise: Standing pendulums fwd, RUE, 2x10 cues for improved relaxation Seated passive fwd flexion (slideboard and towel) 2x8 cues for comfortable ROM, reduced UT compensations Seated passive scaption (slideboard and towel) x8 cues for setup, comfortable positioning and ROM Standing elbow flex/ext x12 emphasis on full ROM  Standing scapular retraction 2x12 (sling donned to assist with avoiding shoulder extension) HEP update + education on safe/appropriate performance, handout  Manual Therapy: Supine; passive physiological movement R shoulder  abduction/flexion to pt tolerance    [facility] Adult PT Treatment:                                                DATE: 11/21/22 Therapeutic Exercise: Fwd pendulums x8 with table support, education/cues for appropriate setup Scapular retractions (emphasis on avoiding extension at elbow) x8 HEP handout + education, emphasis on appropriate mechanics, safety w/ movement, and shoulder precautions Manual Therapy: PROM abduction/flexion within pt tolerance    PATIENT EDUCATION: Education details: rationale for interventions, HEP  Person educated: Patient Education method: Explanation, Demonstration, Tactile cues, Verbal cues Education comprehension: verbalized understanding, returned demonstration, verbal cues required, tactile cues required, and needs further education     HOME EXERCISE PROGRAM: Access Code: [email or web address] URL: [email or web address] Date: 11/27/2022 Prepared by: Fransisco Hertz  Program Notes - please ensure with scapular retraction elbow does not go into extension (keep tucked at side, may perform in sling to help limit)  Exercises - Flexion-Extension Shoulder Pendulum with Table Support  - 2-3 x daily - 1 sets - 10 reps - Seated Scapular Retraction  - 2-3 x daily - 1 sets - 8 reps - Seated Gripping Towel  - 2-3 x daily - 1 sets - 10 reps - Seated Shoulder Flexion Towel Slide at Table Top  - 2-3 x daily - 1 sets - 8 reps  ASSESSMENT:  CLINICAL IMPRESSION: 11/30/2022 Pt arrives w/o pain, reports some discomfort with HEP but overall improving pain levels compared to start of care, no issues after last session. Manual ROM comparable today to last session although less stiffness throughout available range - did begin gently working into ER although unable to get past neutral due to stiffness, nonpainful. Pt does demo improved flexion PROM with table slides. No adverse events, pt denies any pain throughout session or on  departure. Recommend continuing along current POC in order to address relevant deficits and improve functional tolerance. Pt departs today's session in no acute distress, all voiced questions/concerns addressed appropriately from PT perspective.    Per eval - Patient is a 57 y.o. woman who was seen today for physical therapy evaluation and treatment for R shoulder pain s/p rTSA 10/30/22. Pt endorses difficulty with daily activities and self care as expected postoperatively. Exam limited to PROM today given proximity to surgery, measurements as above. Initiation of HEP with emphasis on appropriate mechanics within pt precautions, does well with avoiding shoulder extension with scapular retraction. Tolerates session quite well overall. No adverse events, pt reports no pain on departure. Recommend skilled PT to address  aforementioned deficits with aim of improving functional tolerance and reducing pain with typical activities. Pt departs today's session in no acute distress, all voiced concerns/questions addressed appropriately from PT perspective.      OBJECTIVE IMPAIRMENTS: decreased activity tolerance, decreased endurance, decreased mobility, decreased ROM, decreased strength, impaired UE functional use, postural dysfunction, and pain.   ACTIVITY LIMITATIONS: carrying, lifting, sleeping, bathing, reach over head, and hygiene/grooming  PARTICIPATION LIMITATIONS: meal prep, cleaning, laundry, driving, community activity, and occupation  PERSONAL FACTORS: Time since onset of injury/illness/exacerbation and 1 comorbidity: MS  are also affecting patient's functional outcome.   REHAB POTENTIAL: Good  CLINICAL DECISION MAKING: Stable/uncomplicated  EVALUATION COMPLEXITY: Low   GOALS: Goals reviewed with patient? Yes  SHORT TERM GOALS: Target date: 12/19/2022 Pt will demonstrate appropriate understanding and performance of initially prescribed HEP in order to facilitate improved independence with management of symptoms.  Baseline: HEP provided on eval Goal status: INITIAL   2. Pt will score greater than or equal to 49 on FOTO in order to demonstrate improved perception of function due to symptoms.  Baseline: 34  Goal status: INITIAL    LONG TERM GOALS: Target date: 01/16/2023 Pt will score 63 on FOTO in order to demonstrate improved perception of function due to symptoms. Baseline: 34 Goal status: INITIAL  2.  Pt will demonstrate at least 140 degrees of active shoulder elevation in order to demonstrate improved tolerance to functional movement patterns such as overhead reaching and upper body dressing.  Baseline: see ROM chart above - PROM only on eval Goal status: INITIAL  3.  Pt will demonstrate at least 4+/5 shoulder flex/abduction MMT for improved symmetry of UE strength and improved tolerance  to functional movements.  Baseline: deferred on eval given proximity to surgery Goal status: INITIAL  4. Pt will report/demonstrate ability to perform upper body dressing with less than 2 point increase in pain on NPS in order to indicate improved tolerance/independence with ADLs.  Baseline: increased difficulty/modification required  Goal status: INITIAL   5. Pt will report at least 50% decrease in overall pain levels in past week in order to facilitate improved tolerance to basic ADLs/mobility.   Baseline: 0-5/10  Goal status: INITIAL    6. Pt will demonstrate appropriate performance of final prescribed HEP in order to facilitate improved self-management of symptoms post-discharge.   Baseline: initial HEP prescribed  Goal status: INITIAL    PLAN:  PT FREQUENCY: 2x/week  PT DURATION: 8 weeks  PLANNED INTERVENTIONS: 97164- PT Re-evaluation, 97110-Therapeutic exercises, 97530- Therapeutic activity, 97112- Neuromuscular re-education, 97535- Self Care, 96045- Manual therapy, Patient/Family education, Balance training, Taping, Dry Needling, Joint mobilization, Spinal mobilization, Scar mobilization, Cryotherapy, and Moist heat  PLAN FOR NEXT SESSION: Review/update HEP PRN. Symptom modification strategies as indicated/appropriate. No extension/IR. Phase  2 (weeks 4-6) okay for PROM>AAROM, ER to 45 deg max at 0-30 deg abduction, flexion/scaption <120, submax deltoid isometrics   Ashley Murrain PT, DPT 11/30/2022 11:00 AM

## 2022-11-30 ENCOUNTER — Encounter: Payer: Self-pay | Admitting: Physical Therapy

## 2022-11-30 ENCOUNTER — Ambulatory Visit: Payer: BC Managed Care – PPO | Admitting: Physical Therapy

## 2022-11-30 DIAGNOSIS — M25511 Pain in right shoulder: Secondary | ICD-10-CM | POA: Diagnosis not present

## 2022-11-30 DIAGNOSIS — M25611 Stiffness of right shoulder, not elsewhere classified: Secondary | ICD-10-CM

## 2022-11-30 DIAGNOSIS — M6281 Muscle weakness (generalized): Secondary | ICD-10-CM

## 2022-12-03 ENCOUNTER — Ambulatory Visit: Payer: BC Managed Care – PPO | Admitting: Physical Therapy

## 2022-12-03 ENCOUNTER — Encounter: Payer: Self-pay | Admitting: Physical Therapy

## 2022-12-03 DIAGNOSIS — M25611 Stiffness of right shoulder, not elsewhere classified: Secondary | ICD-10-CM

## 2022-12-03 DIAGNOSIS — M6281 Muscle weakness (generalized): Secondary | ICD-10-CM

## 2022-12-03 DIAGNOSIS — M25511 Pain in right shoulder: Secondary | ICD-10-CM

## 2022-12-03 NOTE — Therapy (Signed)
OUTPATIENT PHYSICAL THERAPY NOTE   Patient Name: Kelly Grant MRN: [id number] DOB:Oct 30, 1965, 57 y.o., female Today's Date: 12/03/2022  END OF SESSION:  PT End of Session - 12/03/22 1015     Visit Number 4    Number of Visits 17    Date for PT Re-Evaluation 01/16/23    Authorization Type [employer]    PT Start Time 1015    PT Stop Time 1059    PT Time Calculation (min) 44 min    Activity Tolerance Patient tolerated treatment well;No increased pain    Behavior During Therapy WFL for tasks assessed/performed                Past Medical History:  Diagnosis Date   Abnormal Pap smear    ASC-H   Arthritis    Chlamydia    History of chicken pox    HSV infection    Multiple sclerosis (HCC)    Neuromuscular disorder (HCC)    MS   Yeast infection    Past Surgical History:  Procedure Laterality Date   CARPAL TUNNEL RELEASE Left    in addition to wrist fracture surgery   CESAREAN SECTION     x2   COLONOSCOPY  2016   DJ-MAC-movi(exc)-TA   INCISION AND DRAINAGE ABSCESS Left    left buttock   POLYPECTOMY  2016   TA   REVERSE SHOULDER ARTHROPLASTY Right 10/30/2022   Procedure: REVERSE SHOULDER ARTHROPLASTY;  Surgeon: Teryl Lucy, MD;  Location: [facility];  Service: Orthopedics;  Laterality: Right;   TUBAL LIGATION     Patient Active Problem List   Diagnosis Date Noted   Diabetes mellitus screening 10/01/2022   ASCUS with positive high risk HPV cervical 05/07/2022   Hyperlipidemia 09/29/2021   Encounter for therapeutic drug monitoring 12/13/2014   Multiple sclerosis (HCC) 05/08/2012   HSV infection 06/22/2011    PCP: Ivonne Andrew, NP  REFERRING PROVIDER: Teryl Lucy, MD  REFERRING DIAG: S/P Right Reverse Total Shoulder Arthroplasty DOS 10/30/2022  THERAPY DIAG:  Right shoulder pain, unspecified chronicity  Stiffness of right shoulder, not elsewhere classified  Muscle weakness (generalized)  Rationale for Evaluation and Treatment:  Rehabilitation  ONSET DATE: s/p rTSA 10/30/22  SUBJECTIVE:                                                                                                                                                                                     Per eval - Pt reports ongoing shoulder pain since August 2024 which led to rTSA on 10/30/22. Since surgery has been using sling most of the time although she states at recent follow up with surgeon she was cleared  to be out of it while sitting. Has been doing elbow/hand exercises. States she is able to do majority of self care tasks without assist but does have increased difficulty and is avoiding liting/heavier tasks as expected post op.  Hand dominance: Right  SUBJECTIVE STATEMENT: 12/03/2022 Pt states she felt good after last session, no pain today. No other new updates, HEP going okay at home, doing 2-3x/day   PERTINENT HISTORY: MS, rSTA 10/30/22  PAIN:  Are you having pain: no pain Location/description: lateral/superior shoulder R  Best-worst over past week: 0-5/10, calms quickly   - aggravating factors: general activity, difficulty describing otherwise - Easing factors: rest, sling, medication  PRECAUTIONS: R shoulder reverse TSA 2 weeks post op: 11/13/22 4 weeks: 11/27/22 6 weeks: 12/11/22 8 weeks: 12/25/22  WEIGHT BEARING RESTRICTIONS: Yes assumed NWB on surgical limb  FALLS:  Has patient fallen in last 6 months? No  LIVING ENVIRONMENT: Third floor apartment with three flights of stairs 16 year old son lives with her  OCCUPATION: Working prior to surgery - operator/lead on packing line at [employer]; lots of time up on feet, has to lift fairly heavy items at times   PLOF: Independent  PATIENT GOALS:  be able to use shoulder more again  NEXT MD VISIT: November 18th   OBJECTIVE:  Note: Objective measures were completed at Evaluation unless otherwise noted.  DIAGNOSTIC FINDINGS:  S/p rTSA 10/30/22  PATIENT SURVEYS:  FOTO 34 current,  63 predicted  COGNITION: Overall cognitive status: Within functional limits for tasks assessed     SENSATION: No sensory complaints, not formally tested  POSTURE: Guarded posture RUE as expected, mild forward head and increased thoracic kyphosis  UPPER EXTREMITY ROM:  A/PROM Right eval Left eval Right 11/27/22 Right 11/30/22  Shoulder flexion P: ~60 deg limited by muscle guarding   P: ~80 deg P: 96 deg painless with towel slides at table  Shoulder abduction P: ~70 deg  P: ~80 deg   Shoulder internal rotation NT     Shoulder external rotation NT     Elbow flexion      Elbow extension      Wrist flexion      Wrist extension       (Blank rows = not tested) (Key: WFL = within functional limits not formally assessed, * = concordant pain, s = stiffness/stretching sensation, NT = not tested)  Comments: pt denies pain with ROM  UPPER EXTREMITY MMT:  MMT Right eval Left eval  Shoulder flexion    Shoulder extension    Shoulder abduction    Shoulder extension    Shoulder internal rotation    Shoulder external rotation    Elbow flexion    Elbow extension    Grip strength    (Blank rows = not tested)  (Key: WFL = within functional limits not formally assessed, * = concordant pain, s = stiffness/stretching sensation, NT = not tested)  Comments: deferred on eval given acuity of surgery  SHOULDER SPECIAL TESTS: Deferred given acuity of surgery  JOINT MOBILITY TESTING:  Deferred given acuity of surgery  PALPATION:  Deferred - visual inspection of incision covered in steristrips, no apparent erythema or swelling, grossly WNL   TODAY'S TREATMENT:  [facility] Adult PT Treatment:                                                DATE: 12/03/22 Therapeutic Exercise: Seated fwd passive flexion x10 with towel and slideboard  Seated passive scaption x10 with  towel and board  Standing abduction isometric, submax 2x5 with towel at wall cues for setup and appropriate force output Seated ER PROM 2x10 with dowel and towel support for appropriate alignment  Supine shoulder flexion AAROM wide grip dowel x6, cues for appropriate setup and comfortable ROM, PT assisting to avoid shoulder extension in resting position Scapular retraction x15, verbal cues to avoid shoulder extension but no longer requiring tactile cues Standing shoulder ER PROM at doorway, x5 with PT tactile cues to avoid shoulder extension, x5 with towel to maintain appropriate shoulder positioning  HEP update + education/handout, emphasis on safe/appropriate setup for ER stretch  Manual Therapy: Supine; PROM to pt tolerance for GH abduction, scaption, and flexion    [facility] Adult PT Treatment:                                                DATE: 11/30/22 Therapeutic Exercise: Seated passive fwd flexion (board and towel) 3x8 cues for reduced UT compensations, comfortable ROM  Scapular retraction outside of sling, x15 cues to avoid shoulder extension, tactile cues to prevent  Scaption passive with board and towel, seated; 2x8  Verbal HEP review/education  Therapeutic Activity: Supine; passive physiological movement  R shoulder into flex, abd, and ER to pt tolerance (ER only to neutral due to stiffness). Gentle oscillations to reduce muscle guarding   [facility] Adult PT Treatment:                                                DATE: 11/27/22 Therapeutic Exercise: Standing pendulums fwd, RUE, 2x10 cues for improved relaxation Seated passive fwd flexion (slideboard and towel) 2x8 cues for comfortable ROM, reduced UT compensations Seated passive scaption (slideboard and towel) x8 cues for setup, comfortable positioning and ROM Standing elbow flex/ext x12 emphasis on full ROM  Standing scapular retraction 2x12 (sling donned to assist with avoiding shoulder extension) HEP update + education on  safe/appropriate performance, handout  Manual Therapy: Supine; passive physiological movement R shoulder  abduction/flexion to pt tolerance     PATIENT EDUCATION: Education details: rationale for interventions, HEP  Person educated: Patient Education method: Explanation, Demonstration, Tactile cues, Verbal cues Education comprehension: verbalized understanding, returned demonstration, verbal cues required, tactile cues required, and needs further education    HOME EXERCISE PROGRAM: Access Code: [email or web address] URL: [email or web address] Date: 12/03/2022 Prepared by: Fransisco Hertz  Program Notes - please ensure with scapular retraction elbow does not go into extension (keep tucked at side, may perform in sling to help limit)- with external rotation stretch - keep towel tucked at elbow as done in clinic to avoid going into any shoulder extension  Exercises - Seated Scapular Retraction  - 2-3 x daily - 1 sets - 15 reps - Seated Gripping Towel  - 2-3 x daily - 1 sets - 10 reps -  Seated Elbow Flexion and Extension AROM  - 2-3 x daily - 1 sets - 12 reps - Seated Shoulder Flexion Towel Slide at Table Top  - 2-3 x daily - 1 sets - 8 reps - Standing Shoulder External Rotation Stretch in Doorway  - 2-3 x daily - 1 sets - 8 reps  ASSESSMENT:  CLINICAL IMPRESSION: 12/03/2022 Pt arrives w/o pain, no issues after last session. Today continuing with manual for GH mobility, and progressing to introduction of submax deltoid isometrics and AAROM within available ROM as pt is now >5 weeks post op, phase 2 murphy wainer protocol. Tolerates session well overall although we are unable to initiate flexion isometrics given inability to obtain appropriate alignment with ER stiffness, ER stiffness also appears to affect kinematics with supine flexion AAROM. Given this, updated HEP to address this deficit with emphasis on appropriate setup and avoiding compensations. Pt tolerates session well without  any pain or adverse event. Recommend continuing along current POC in order to address relevant deficits and improve functional tolerance. Pt departs today's session in no acute distress, all voiced questions/concerns addressed appropriately from PT perspective.     Per eval - Patient is a 56 y.o. woman who was seen today for physical therapy evaluation and treatment for R shoulder pain s/p rTSA 10/30/22. Pt endorses difficulty with daily activities and self care as expected postoperatively. Exam limited to PROM today given proximity to surgery, measurements as above. Initiation of HEP with emphasis on appropriate mechanics within pt precautions, does well with avoiding shoulder extension with scapular retraction. Tolerates session quite well overall. No adverse events, pt reports no pain on departure. Recommend skilled PT to address aforementioned deficits with aim of improving functional tolerance and reducing pain with typical activities. Pt departs today's session in no acute distress, all voiced concerns/questions addressed appropriately from PT perspective.      OBJECTIVE IMPAIRMENTS: decreased activity tolerance, decreased endurance, decreased mobility, decreased ROM, decreased strength, impaired UE functional use, postural dysfunction, and pain.   ACTIVITY LIMITATIONS: carrying, lifting, sleeping, bathing, reach over head, and hygiene/grooming  PARTICIPATION LIMITATIONS: meal prep, cleaning, laundry, driving, community activity, and occupation  PERSONAL FACTORS: Time since onset of injury/illness/exacerbation and 1 comorbidity: MS  are also affecting patient's functional outcome.   REHAB POTENTIAL: Good  CLINICAL DECISION MAKING: Stable/uncomplicated  EVALUATION COMPLEXITY: Low   GOALS: Goals reviewed with patient? Yes  SHORT TERM GOALS: Target date: 12/19/2022 Pt will demonstrate appropriate understanding and performance of initially prescribed HEP in order to facilitate improved  independence with management of symptoms.  Baseline: HEP provided on eval Goal status: INITIAL   2. Pt will score greater than or equal to 49 on FOTO in order to demonstrate improved perception of function due to symptoms.  Baseline: 34  Goal status: INITIAL    LONG TERM GOALS: Target date: 01/16/2023 Pt will score 63 on FOTO in order to demonstrate improved perception of function due to symptoms. Baseline: 34 Goal status: INITIAL  2.  Pt will demonstrate at least 140 degrees of active shoulder elevation in order to demonstrate improved tolerance to functional movement patterns such as overhead reaching and upper body dressing.  Baseline: see ROM chart above - PROM only on eval Goal status: INITIAL  3.  Pt will demonstrate at least 4+/5 shoulder flex/abduction MMT for improved symmetry of UE strength and improved tolerance to functional movements.  Baseline: deferred on eval given proximity to surgery Goal status: INITIAL  4. Pt will report/demonstrate ability to perform  upper body dressing with less than 2 point increase in pain on NPS in order to indicate improved tolerance/independence with ADLs.  Baseline: increased difficulty/modification required  Goal status: INITIAL   5. Pt will report at least 50% decrease in overall pain levels in past week in order to facilitate improved tolerance to basic ADLs/mobility.   Baseline: 0-5/10  Goal status: INITIAL    6. Pt will demonstrate appropriate performance of final prescribed HEP in order to facilitate improved self-management of symptoms post-discharge.   Baseline: initial HEP prescribed  Goal status: INITIAL    PLAN:  PT FREQUENCY: 2x/week  PT DURATION: 8 weeks  PLANNED INTERVENTIONS: 97164- PT Re-evaluation, 97110-Therapeutic exercises, 97530- Therapeutic activity, 97112- Neuromuscular re-education, 97535- Self Care, 13244- Manual therapy, Patient/Family education, Balance training, Taping, Dry Needling, Joint mobilization,  Spinal mobilization, Scar mobilization, Cryotherapy, and Moist heat  PLAN FOR NEXT SESSION: Review/update HEP PRN. Symptom modification strategies as indicated/appropriate. No extension/IR. Phase 2 (weeks 4-6) okay for PROM>AAROM, ER to 45 deg max at 0-30 deg abduction, flexion/scaption <120, submax deltoid isometrics   Ashley Murrain PT, DPT 12/03/2022 11:04 AM

## 2022-12-05 ENCOUNTER — Ambulatory Visit: Payer: BC Managed Care – PPO

## 2022-12-05 DIAGNOSIS — M6281 Muscle weakness (generalized): Secondary | ICD-10-CM

## 2022-12-05 DIAGNOSIS — M25511 Pain in right shoulder: Secondary | ICD-10-CM

## 2022-12-05 DIAGNOSIS — M25611 Stiffness of right shoulder, not elsewhere classified: Secondary | ICD-10-CM

## 2022-12-05 NOTE — Therapy (Signed)
OUTPATIENT PHYSICAL THERAPY NOTE   Patient Name: Kelly Grant MRN: [id number] DOB:Jun 21, 1965, 57 y.o., female Today's Date: 12/05/2022  END OF SESSION:  PT End of Session - 12/05/22 0917     Visit Number 5    Number of Visits 17    Date for PT Re-Evaluation 01/16/23    Authorization Type [employer]    Authorization Time Period 15 visits approved 11/26/22-02/24/23    Authorization - Visit Number 4    Authorization - Number of Visits 15    PT Start Time 0916    PT Stop Time 0956    PT Time Calculation (min) 40 min    Activity Tolerance Patient tolerated treatment well;No increased pain    Behavior During Therapy WFL for tasks assessed/performed             Past Medical History:  Diagnosis Date   Abnormal Pap smear    ASC-H   Arthritis    Chlamydia    History of chicken pox    HSV infection    Multiple sclerosis (HCC)    Neuromuscular disorder (HCC)    MS   Yeast infection    Past Surgical History:  Procedure Laterality Date   CARPAL TUNNEL RELEASE Left    in addition to wrist fracture surgery   CESAREAN SECTION     x2   COLONOSCOPY  2016   DJ-MAC-movi(exc)-TA   INCISION AND DRAINAGE ABSCESS Left    left buttock   POLYPECTOMY  2016   TA   REVERSE SHOULDER ARTHROPLASTY Right 10/30/2022   Procedure: REVERSE SHOULDER ARTHROPLASTY;  Surgeon: Teryl Lucy, MD;  Location: [facility];  Service: Orthopedics;  Laterality: Right;   TUBAL LIGATION     Patient Active Problem List   Diagnosis Date Noted   Diabetes mellitus screening 10/01/2022   ASCUS with positive high risk HPV cervical 05/07/2022   Hyperlipidemia 09/29/2021   Encounter for therapeutic drug monitoring 12/13/2014   Multiple sclerosis (HCC) 05/08/2012   HSV infection 06/22/2011    PCP: Ivonne Andrew, NP  REFERRING PROVIDER: Teryl Lucy, MD  REFERRING DIAG: S/P Right Reverse Total Shoulder Arthroplasty DOS 10/30/2022  THERAPY DIAG:  Right shoulder pain, unspecified chronicity  Stiffness of  right shoulder, not elsewhere classified  Muscle weakness (generalized)  Rationale for Evaluation and Treatment: Rehabilitation  ONSET DATE: s/p rTSA 10/30/22  SUBJECTIVE:                                                                                                                                                                                     Per eval - Pt reports ongoing shoulder pain since August 2024 which led to rTSA  on 10/30/22. Since surgery has been using sling most of the time although she states at recent follow up with surgeon she was cleared to be out of it while sitting. Has been doing elbow/hand exercises. States she is able to do majority of self care tasks without assist but does have increased difficulty and is avoiding liting/heavier tasks as expected post op.  Hand dominance: Right  SUBJECTIVE STATEMENT: Patient reports no current pain and no soreness after previous session. She states she tried the new ER stretch at home but isn't sure if she did it correctly. She states that she sees the surgeon next Monday.    PERTINENT HISTORY: MS, rSTA 10/30/22  PAIN:  Are you having pain: no pain Location/description: lateral/superior shoulder R  Best-worst over past week: 0-5/10, calms quickly   - aggravating factors: general activity, difficulty describing otherwise - Easing factors: rest, sling, medication  PRECAUTIONS: R shoulder reverse TSA 2 weeks post op: 11/13/22 4 weeks: 11/27/22 6 weeks: 12/11/22 8 weeks: 12/25/22  WEIGHT BEARING RESTRICTIONS: Yes assumed NWB on surgical limb  FALLS:  Has patient fallen in last 6 months? No  LIVING ENVIRONMENT: Third floor apartment with three flights of stairs 84 year old son lives with her  OCCUPATION: Working prior to surgery - operator/lead on packing line at [employer]; lots of time up on feet, has to lift fairly heavy items at times   PLOF: Independent  PATIENT GOALS:  be able to use shoulder more again  NEXT MD  VISIT: November 18th   OBJECTIVE:  Note: Objective measures were completed at Evaluation unless otherwise noted.  DIAGNOSTIC FINDINGS:  S/p rTSA 10/30/22  PATIENT SURVEYS:  FOTO 34 current, 63 predicted  COGNITION: Overall cognitive status: Within functional limits for tasks assessed     SENSATION: No sensory complaints, not formally tested  POSTURE: Guarded posture RUE as expected, mild forward head and increased thoracic kyphosis  UPPER EXTREMITY ROM:  A/PROM Right eval Left eval Right 11/27/22 Right 11/30/22  Shoulder flexion P: ~60 deg limited by muscle guarding   P: ~80 deg P: 96 deg painless with towel slides at table  Shoulder abduction P: ~70 deg  P: ~80 deg   Shoulder internal rotation NT     Shoulder external rotation NT     Elbow flexion      Elbow extension      Wrist flexion      Wrist extension       (Blank rows = not tested) (Key: WFL = within functional limits not formally assessed, * = concordant pain, s = stiffness/stretching sensation, NT = not tested)  Comments: pt denies pain with ROM  UPPER EXTREMITY MMT:  MMT Right eval Left eval  Shoulder flexion    Shoulder extension    Shoulder abduction    Shoulder extension    Shoulder internal rotation    Shoulder external rotation    Elbow flexion    Elbow extension    Grip strength    (Blank rows = not tested)  (Key: WFL = within functional limits not formally assessed, * = concordant pain, s = stiffness/stretching sensation, NT = not tested)  Comments: deferred on eval given acuity of surgery  SHOULDER SPECIAL TESTS: Deferred given acuity of surgery  JOINT MOBILITY TESTING:  Deferred given acuity of surgery  PALPATION:  Deferred - visual inspection of incision covered in steristrips, no apparent erythema or swelling, grossly WNL   TODAY'S TREATMENT:  [facility] Adult PT Treatment:                                                DATE: 12/05/22 Therapeutic Exercise: Seated fwd passive  flexion x10 with towel and slideboard  Seated passive scaption x10 with towel and board  Standing abduction isometric, submax 2x5 with towel at wall cues for setup and appropriate force output Seated ER PROM 2x10 with dowel and towel support for appropriate alignment  Supine shoulder flexion AAROM wide grip dowel x6, cues for appropriate setup and comfortable ROM, PT assisting to avoid shoulder extension in resting position Scapular retraction x15, verbal cues to avoid shoulder extension but no longer requiring tactile cues Standing shoulder ER PROM at doorway, x5 with PT tactile cues to avoid shoulder extension, x5 with towel to maintain appropriate shoulder positioning  Manual Therapy: Supine; PROM to pt tolerance for GH abduction, scaption, and flexion                                                                                                                                [facility] Adult PT Treatment:                                                DATE: 12/03/22 Therapeutic Exercise: Seated fwd passive flexion x10 with towel and slideboard  Seated passive scaption x10 with towel and board  Standing abduction isometric, submax 2x5 with towel at wall cues for setup and appropriate force output Seated ER PROM 2x10 with dowel and towel support for appropriate alignment  Supine shoulder flexion AAROM wide grip dowel x6, cues for appropriate setup and comfortable ROM, PT assisting to avoid shoulder extension in resting position Scapular retraction x15, verbal cues to avoid shoulder extension but no longer requiring tactile cues Standing shoulder ER PROM at doorway, x5 with PT tactile cues to avoid shoulder extension, x5 with towel to maintain appropriate shoulder positioning  HEP update + education/handout, emphasis on safe/appropriate setup for ER stretch  Manual Therapy: Supine; PROM to pt tolerance for GH abduction, scaption, and flexion    [facility] Adult PT Treatment:                                                 DATE: 11/30/22 Therapeutic Exercise: Seated passive fwd flexion (board and towel) 3x8 cues for reduced UT compensations, comfortable ROM  Scapular retraction outside of sling, x15 cues to avoid shoulder extension, tactile cues to prevent  Scaption passive with board and towel, seated; 2x8  Verbal HEP  review/education  Therapeutic Activity: Supine; passive physiological movement  R shoulder into flex, abd, and ER to pt tolerance (ER only to neutral due to stiffness). Gentle oscillations to reduce muscle guarding    PATIENT EDUCATION: Education details: rationale for interventions, HEP  Person educated: Patient Education method: Explanation, Demonstration, Tactile cues, Verbal cues Education comprehension: verbalized understanding, returned demonstration, verbal cues required, tactile cues required, and needs further education    HOME EXERCISE PROGRAM: Access Code: [email or web address] URL: [email or web address] Date: 12/03/2022 Prepared by: Fransisco Hertz  Program Notes - please ensure with scapular retraction elbow does not go into extension (keep tucked at side, may perform in sling to help limit)- with external rotation stretch - keep towel tucked at elbow as done in clinic to avoid going into any shoulder extension  Exercises - Seated Scapular Retraction  - 2-3 x daily - 1 sets - 15 reps - Seated Gripping Towel  - 2-3 x daily - 1 sets - 10 reps - Seated Elbow Flexion and Extension AROM  - 2-3 x daily - 1 sets - 12 reps - Seated Shoulder Flexion Towel Slide at Table Top  - 2-3 x daily - 1 sets - 8 reps - Standing Shoulder External Rotation Stretch in Doorway  - 2-3 x daily - 1 sets - 8 reps  ASSESSMENT:  CLINICAL IMPRESSION: Patient presents to PT report no current pain and no soreness after previous session. Today session continued to focus on AAROM within tolerance, PROM, and submaximal isometrics. She needs occasional verbal and tactile cues to perform  exercises correctly. Patient was able to tolerate all prescribed exercises with no adverse effects. Patient continues to benefit from skilled PT services and should be progressed as able to improve functional independence.    Per eval - Patient is a 57 y.o. woman who was seen today for physical therapy evaluation and treatment for R shoulder pain s/p rTSA 10/30/22. Pt endorses difficulty with daily activities and self care as expected postoperatively. Exam limited to PROM today given proximity to surgery, measurements as above. Initiation of HEP with emphasis on appropriate mechanics within pt precautions, does well with avoiding shoulder extension with scapular retraction. Tolerates session quite well overall. No adverse events, pt reports no pain on departure. Recommend skilled PT to address aforementioned deficits with aim of improving functional tolerance and reducing pain with typical activities. Pt departs today's session in no acute distress, all voiced concerns/questions addressed appropriately from PT perspective.      OBJECTIVE IMPAIRMENTS: decreased activity tolerance, decreased endurance, decreased mobility, decreased ROM, decreased strength, impaired UE functional use, postural dysfunction, and pain.   ACTIVITY LIMITATIONS: carrying, lifting, sleeping, bathing, reach over head, and hygiene/grooming  PARTICIPATION LIMITATIONS: meal prep, cleaning, laundry, driving, community activity, and occupation  PERSONAL FACTORS: Time since onset of injury/illness/exacerbation and 1 comorbidity: MS  are also affecting patient's functional outcome.   REHAB POTENTIAL: Good  CLINICAL DECISION MAKING: Stable/uncomplicated  EVALUATION COMPLEXITY: Low   GOALS: Goals reviewed with patient? Yes  SHORT TERM GOALS: Target date: 12/19/2022 Pt will demonstrate appropriate understanding and performance of initially prescribed HEP in order to facilitate improved independence with management of symptoms.   Baseline: HEP provided on eval Goal status: INITIAL   2. Pt will score greater than or equal to 49 on FOTO in order to demonstrate improved perception of function due to symptoms.  Baseline: 34  Goal status: INITIAL    LONG TERM GOALS: Target date: 01/16/2023 Pt will score 63 on FOTO in order to  demonstrate improved perception of function due to symptoms. Baseline: 34 Goal status: INITIAL  2.  Pt will demonstrate at least 140 degrees of active shoulder elevation in order to demonstrate improved tolerance to functional movement patterns such as overhead reaching and upper body dressing.  Baseline: see ROM chart above - PROM only on eval Goal status: INITIAL  3.  Pt will demonstrate at least 4+/5 shoulder flex/abduction MMT for improved symmetry of UE strength and improved tolerance to functional movements.  Baseline: deferred on eval given proximity to surgery Goal status: INITIAL  4. Pt will report/demonstrate ability to perform upper body dressing with less than 2 point increase in pain on NPS in order to indicate improved tolerance/independence with ADLs.  Baseline: increased difficulty/modification required  Goal status: INITIAL   5. Pt will report at least 50% decrease in overall pain levels in past week in order to facilitate improved tolerance to basic ADLs/mobility.   Baseline: 0-5/10  Goal status: INITIAL    6. Pt will demonstrate appropriate performance of final prescribed HEP in order to facilitate improved self-management of symptoms post-discharge.   Baseline: initial HEP prescribed  Goal status: INITIAL    PLAN:  PT FREQUENCY: 2x/week  PT DURATION: 8 weeks  PLANNED INTERVENTIONS: 97164- PT Re-evaluation, 97110-Therapeutic exercises, 97530- Therapeutic activity, 97112- Neuromuscular re-education, 97535- Self Care, 54098- Manual therapy, Patient/Family education, Balance training, Taping, Dry Needling, Joint mobilization, Spinal mobilization, Scar mobilization,  Cryotherapy, and Moist heat  PLAN FOR NEXT SESSION: Review/update HEP PRN. Symptom modification strategies as indicated/appropriate. No extension/IR. Phase 2 (weeks 4-6) okay for PROM>AAROM, ER to 45 deg max at 0-30 deg abduction, flexion/scaption <120, submax deltoid isometrics   Berta Minor PTA 12/05/2022 9:55 AM

## 2022-12-10 DIAGNOSIS — M19011 Primary osteoarthritis, right shoulder: Secondary | ICD-10-CM | POA: Diagnosis not present

## 2022-12-10 NOTE — Therapy (Signed)
OUTPATIENT PHYSICAL THERAPY NOTE   Patient Name: Kelly Grant MRN: [id number] DOB:July 16, 1965, 57 y.o., female Today's Date: 12/11/2022  END OF SESSION:  PT End of Session - 12/11/22 1014     Visit Number 6    Number of Visits 17    Date for PT Re-Evaluation 01/16/23    Authorization Type [employer]    Authorization Time Period 15 visits approved 11/26/22-02/24/23    Authorization - Visit Number 5    Authorization - Number of Visits 15    PT Start Time 1015    PT Stop Time 1057    PT Time Calculation (min) 42 min    Activity Tolerance Patient tolerated treatment well;No increased pain    Behavior During Therapy WFL for tasks assessed/performed              Past Medical History:  Diagnosis Date   Abnormal Pap smear    ASC-H   Arthritis    Chlamydia    History of chicken pox    HSV infection    Multiple sclerosis (HCC)    Neuromuscular disorder (HCC)    MS   Yeast infection    Past Surgical History:  Procedure Laterality Date   CARPAL TUNNEL RELEASE Left    in addition to wrist fracture surgery   CESAREAN SECTION     x2   COLONOSCOPY  2016   DJ-MAC-movi(exc)-TA   INCISION AND DRAINAGE ABSCESS Left    left buttock   POLYPECTOMY  2016   TA   REVERSE SHOULDER ARTHROPLASTY Right 10/30/2022   Procedure: REVERSE SHOULDER ARTHROPLASTY;  Surgeon: Teryl Lucy, MD;  Location: [facility];  Service: Orthopedics;  Laterality: Right;   TUBAL LIGATION     Patient Active Problem List   Diagnosis Date Noted   Diabetes mellitus screening 10/01/2022   ASCUS with positive high risk HPV cervical 05/07/2022   Hyperlipidemia 09/29/2021   Encounter for therapeutic drug monitoring 12/13/2014   Multiple sclerosis (HCC) 05/08/2012   HSV infection 06/22/2011    PCP: Ivonne Andrew, NP  REFERRING PROVIDER: Teryl Lucy, MD  REFERRING DIAG: S/P Right Reverse Total Shoulder Arthroplasty DOS 10/30/2022  THERAPY DIAG:  Right shoulder pain, unspecified chronicity  Stiffness of  right shoulder, not elsewhere classified  Muscle weakness (generalized)  Rationale for Evaluation and Treatment: Rehabilitation  ONSET DATE: s/p rTSA 10/30/22  SUBJECTIVE:                                                                                                                                                                                     Per eval - Pt reports ongoing shoulder pain since August 2024 which led to  rTSA on 10/30/22. Since surgery has been using sling most of the time although she states at recent follow up with surgeon she was cleared to be out of it while sitting. Has been doing elbow/hand exercises. States she is able to do majority of self care tasks without assist but does have increased difficulty and is avoiding liting/heavier tasks as expected post op.  Hand dominance: Right  SUBJECTIVE STATEMENT: 12/11/2022 No pain at present, did well after last session. States she saw surgeon yesterday, is cleared to drive, states they were happy with progress, took XR. Still wearing sling in community.    PERTINENT HISTORY: MS, rSTA 10/30/22  PAIN:  Are you having pain: no pain Location/description: lateral/superior shoulder R  Best-worst over past week: 0-5/10, calms quickly   - aggravating factors: general activity, difficulty describing otherwise - Easing factors: rest, sling, medication  PRECAUTIONS: R shoulder reverse TSA 6 weeks: 12/11/22 8 weeks: 12/25/22  WEIGHT BEARING RESTRICTIONS: Yes assumed NWB on surgical limb  FALLS:  Has patient fallen in last 6 months? No  LIVING ENVIRONMENT: Third floor apartment with three flights of stairs 68 year old son lives with her  OCCUPATION: Working prior to surgery - operator/lead on packing line at [employer]; lots of time up on feet, has to lift fairly heavy items at times   PLOF: Independent  PATIENT GOALS:  be able to use shoulder more again  NEXT MD VISIT: December 16th  OBJECTIVE:  Note: Objective  measures were completed at Evaluation unless otherwise noted.  DIAGNOSTIC FINDINGS:  S/p rTSA 10/30/22  PATIENT SURVEYS:  FOTO 34 current, 63 predicted 12/11/22 FOTO 50  COGNITION: Overall cognitive status: Within functional limits for tasks assessed     SENSATION: No sensory complaints, not formally tested  POSTURE: Guarded posture RUE as expected, mild forward head and increased thoracic kyphosis  UPPER EXTREMITY ROM:  A/PROM Right eval Left eval Right 11/27/22 Right 11/30/22  Shoulder flexion P: ~60 deg limited by muscle guarding   P: ~80 deg P: 96 deg painless with towel slides at table  Shoulder abduction P: ~70 deg  P: ~80 deg   Shoulder internal rotation NT     Shoulder external rotation NT     Elbow flexion      Elbow extension      Wrist flexion      Wrist extension       (Blank rows = not tested) (Key: WFL = within functional limits not formally assessed, * = concordant pain, s = stiffness/stretching sensation, NT = not tested)  Comments: pt denies pain with ROM  UPPER EXTREMITY MMT:  MMT Right eval Left eval  Shoulder flexion    Shoulder extension    Shoulder abduction    Shoulder extension    Shoulder internal rotation    Shoulder external rotation    Elbow flexion    Elbow extension    Grip strength    (Blank rows = not tested)  (Key: WFL = within functional limits not formally assessed, * = concordant pain, s = stiffness/stretching sensation, NT = not tested)  Comments: deferred on eval given acuity of surgery  SHOULDER SPECIAL TESTS: Deferred given acuity of surgery  JOINT MOBILITY TESTING:  Deferred given acuity of surgery  PALPATION:  Deferred - visual inspection of incision covered in steristrips, no apparent erythema or swelling, grossly WNL   TODAY'S TREATMENT:            [facility] Adult PT Treatment:  DATE: 12/11/22 Therapeutic Exercise: Supine dowel shoulder flexion AAROM x10 towel to  mitigate shoulder extension at resting position Supine clasped shoulder flexion AAROM w/ contralat limb x8 Supine ER PROM w/ towel support and dowel x12 PT assist/cues for appropriate alignment/ROM Standing shoulder flexion isometrics x12, submax, cues for positioning/comfort Standing shoulder abduction isometric x12 submax cues for positioning Standing doorway ER stretch 4x30sec with towel support to avoid extension, increased time spent w/ education on home setup and appropriate performance Manual Therapy: Supine; PROM R GH flex/abduction/ER to pt tolerance - emphasis on ER today    [facility] Adult PT Treatment:                                                DATE: 12/05/22 Therapeutic Exercise: Seated fwd passive flexion x10 with towel and slideboard  Seated passive scaption x10 with towel and board  Standing abduction isometric, submax 2x5 with towel at wall cues for setup and appropriate force output Seated ER PROM 2x10 with dowel and towel support for appropriate alignment  Supine shoulder flexion AAROM wide grip dowel x6, cues for appropriate setup and comfortable ROM, PT assisting to avoid shoulder extension in resting position Scapular retraction x15, verbal cues to avoid shoulder extension but no longer requiring tactile cues Standing shoulder ER PROM at doorway, x5 with PT tactile cues to avoid shoulder extension, x5 with towel to maintain appropriate shoulder positioning  Manual Therapy: Supine; PROM to pt tolerance for GH abduction, scaption, and flexion                                                                                                                                [facility] Adult PT Treatment:                                                DATE: 12/03/22 Therapeutic Exercise: Seated fwd passive flexion x10 with towel and slideboard  Seated passive scaption x10 with towel and board  Standing abduction isometric, submax 2x5 with towel at wall cues for setup and appropriate  force output Seated ER PROM 2x10 with dowel and towel support for appropriate alignment  Supine shoulder flexion AAROM wide grip dowel x6, cues for appropriate setup and comfortable ROM, PT assisting to avoid shoulder extension in resting position Scapular retraction x15, verbal cues to avoid shoulder extension but no longer requiring tactile cues Standing shoulder ER PROM at doorway, x5 with PT tactile cues to avoid shoulder extension, x5 with towel to maintain appropriate shoulder positioning  HEP update + education/handout, emphasis on safe/appropriate setup for ER stretch  Manual Therapy: Supine; PROM to pt tolerance for GH abduction, scaption, and flexion  PATIENT EDUCATION: Education details: rationale for interventions, HEP  Person educated: Patient Education method: Explanation, Demonstration, Tactile cues, Verbal cues Education comprehension: verbalized understanding, returned demonstration, verbal cues required, tactile cues required, and needs further education    HOME EXERCISE PROGRAM: Access Code: [email or web address] URL: [email or web address] Date: 12/03/2022 Prepared by: Fransisco Hertz  Program Notes - please ensure with scapular retraction elbow does not go into extension (keep tucked at side, may perform in sling to help limit)- with external rotation stretch - keep towel tucked at elbow as done in clinic to avoid going into any shoulder extension  Exercises - Seated Scapular Retraction  - 2-3 x daily - 1 sets - 15 reps - Seated Gripping Towel  - 2-3 x daily - 1 sets - 10 reps - Seated Elbow Flexion and Extension AROM  - 2-3 x daily - 1 sets - 12 reps - Seated Shoulder Flexion Towel Slide at Table Top  - 2-3 x daily - 1 sets - 8 reps - Standing Shoulder External Rotation Stretch in Doorway  - 2-3 x daily - 1 sets - 8 reps  ASSESSMENT:  CLINICAL IMPRESSION: 12/11/2022 Pt arrives w/o pain, notes follow up with surgeon went well. Today pt is at 6 week mark post  op, continuing to progress for increased volume with AAROM for [facility] elevation, emphasis on ER mobility as it remains quite stiff (albeit painless). Also spent increased time ensuring appropriate setup of ER ROM at home. No adverse events, pt reports no pain on departure. Recommend continuing along current POC in order to address relevant deficits and improve functional tolerance. Pt departs today's session in no acute distress, all voiced questions/concerns addressed appropriately from PT perspective.    Per eval - Patient is a 57 y.o. woman who was seen today for physical therapy evaluation and treatment for R shoulder pain s/p rTSA 10/30/22. Pt endorses difficulty with daily activities and self care as expected postoperatively. Exam limited to PROM today given proximity to surgery, measurements as above. Initiation of HEP with emphasis on appropriate mechanics within pt precautions, does well with avoiding shoulder extension with scapular retraction. Tolerates session quite well overall. No adverse events, pt reports no pain on departure. Recommend skilled PT to address aforementioned deficits with aim of improving functional tolerance and reducing pain with typical activities. Pt departs today's session in no acute distress, all voiced concerns/questions addressed appropriately from PT perspective.      OBJECTIVE IMPAIRMENTS: decreased activity tolerance, decreased endurance, decreased mobility, decreased ROM, decreased strength, impaired UE functional use, postural dysfunction, and pain.   ACTIVITY LIMITATIONS: carrying, lifting, sleeping, bathing, reach over head, and hygiene/grooming  PARTICIPATION LIMITATIONS: meal prep, cleaning, laundry, driving, community activity, and occupation  PERSONAL FACTORS: Time since onset of injury/illness/exacerbation and 1 comorbidity: MS  are also affecting patient's functional outcome.   REHAB POTENTIAL: Good  CLINICAL DECISION MAKING:  Stable/uncomplicated  EVALUATION COMPLEXITY: Low   GOALS: Goals reviewed with patient? Yes  SHORT TERM GOALS: Target date: 12/19/2022 Pt will demonstrate appropriate understanding and performance of initially prescribed HEP in order to facilitate improved independence with management of symptoms.  Baseline: HEP provided on eval Goal status: INITIAL   2. Pt will score greater than or equal to 49 on FOTO in order to demonstrate improved perception of function due to symptoms.  Baseline: 34  Goal status: INITIAL    LONG TERM GOALS: Target date: 01/16/2023 Pt will score 63 on FOTO in order to demonstrate improved perception of function due to symptoms.  Baseline: 34 Goal status: INITIAL  2.  Pt will demonstrate at least 140 degrees of active shoulder elevation in order to demonstrate improved tolerance to functional movement patterns such as overhead reaching and upper body dressing.  Baseline: see ROM chart above - PROM only on eval Goal status: INITIAL  3.  Pt will demonstrate at least 4+/5 shoulder flex/abduction MMT for improved symmetry of UE strength and improved tolerance to functional movements.  Baseline: deferred on eval given proximity to surgery Goal status: INITIAL  4. Pt will report/demonstrate ability to perform upper body dressing with less than 2 point increase in pain on NPS in order to indicate improved tolerance/independence with ADLs.  Baseline: increased difficulty/modification required  Goal status: INITIAL   5. Pt will report at least 50% decrease in overall pain levels in past week in order to facilitate improved tolerance to basic ADLs/mobility.   Baseline: 0-5/10  Goal status: INITIAL    6. Pt will demonstrate appropriate performance of final prescribed HEP in order to facilitate improved self-management of symptoms post-discharge.   Baseline: initial HEP prescribed  Goal status: INITIAL    PLAN:  PT FREQUENCY: 2x/week  PT DURATION: 8  weeks  PLANNED INTERVENTIONS: 97164- PT Re-evaluation, 97110-Therapeutic exercises, 97530- Therapeutic activity, 97112- Neuromuscular re-education, 97535- Self Care, 16109- Manual therapy, Patient/Family education, Balance training, Taping, Dry Needling, Joint mobilization, Spinal mobilization, Scar mobilization, Cryotherapy, and Moist heat  PLAN FOR NEXT SESSION: Review/update HEP PRN. Symptom modification strategies as indicated/appropriate. No extension/IR. Phase 2 (weeks 4-6) okay for PROM>AAROM, ER to 45 deg max at 0-30 deg abduction, flexion/scaption <120, submax deltoid isometrics   Ashley Murrain PT, DPT 12/11/2022 11:00 AM

## 2022-12-11 ENCOUNTER — Ambulatory Visit: Payer: BC Managed Care – PPO | Admitting: Physical Therapy

## 2022-12-11 ENCOUNTER — Encounter: Payer: Self-pay | Admitting: Physical Therapy

## 2022-12-11 DIAGNOSIS — M25511 Pain in right shoulder: Secondary | ICD-10-CM

## 2022-12-11 DIAGNOSIS — M6281 Muscle weakness (generalized): Secondary | ICD-10-CM

## 2022-12-11 DIAGNOSIS — M25611 Stiffness of right shoulder, not elsewhere classified: Secondary | ICD-10-CM

## 2022-12-13 ENCOUNTER — Ambulatory Visit: Payer: BC Managed Care – PPO

## 2022-12-13 ENCOUNTER — Other Ambulatory Visit: Payer: Self-pay | Admitting: Nurse Practitioner

## 2022-12-13 DIAGNOSIS — M6281 Muscle weakness (generalized): Secondary | ICD-10-CM

## 2022-12-13 DIAGNOSIS — E785 Hyperlipidemia, unspecified: Secondary | ICD-10-CM

## 2022-12-13 DIAGNOSIS — M25511 Pain in right shoulder: Secondary | ICD-10-CM

## 2022-12-13 DIAGNOSIS — M25611 Stiffness of right shoulder, not elsewhere classified: Secondary | ICD-10-CM

## 2022-12-13 NOTE — Therapy (Signed)
OUTPATIENT PHYSICAL THERAPY NOTE   Patient Name: Kelly Grant MRN: [id number] DOB:1965/05/06, 57 y.o., female Today's Date: 12/13/2022  END OF SESSION:  PT End of Session - 12/13/22 1000     Visit Number 7    Number of Visits 17    Date for PT Re-Evaluation 01/16/23    Authorization Type [employer]    Authorization Time Period 15 visits approved 11/26/22-02/24/23    Authorization - Visit Number 6    Authorization - Number of Visits 15    PT Start Time 1000    PT Stop Time 1038    PT Time Calculation (min) 38 min    Activity Tolerance Patient tolerated treatment well;No increased pain    Behavior During Therapy WFL for tasks assessed/performed               Past Medical History:  Diagnosis Date   Abnormal Pap smear    ASC-H   Arthritis    Chlamydia    History of chicken pox    HSV infection    Multiple sclerosis (HCC)    Neuromuscular disorder (HCC)    MS   Yeast infection    Past Surgical History:  Procedure Laterality Date   CARPAL TUNNEL RELEASE Left    in addition to wrist fracture surgery   CESAREAN SECTION     x2   COLONOSCOPY  2016   DJ-MAC-movi(exc)-TA   INCISION AND DRAINAGE ABSCESS Left    left buttock   POLYPECTOMY  2016   TA   REVERSE SHOULDER ARTHROPLASTY Right 10/30/2022   Procedure: REVERSE SHOULDER ARTHROPLASTY;  Surgeon: Teryl Lucy, MD;  Location: [facility];  Service: Orthopedics;  Laterality: Right;   TUBAL LIGATION     Patient Active Problem List   Diagnosis Date Noted   Diabetes mellitus screening 10/01/2022   ASCUS with positive high risk HPV cervical 05/07/2022   Hyperlipidemia 09/29/2021   Encounter for therapeutic drug monitoring 12/13/2014   Multiple sclerosis (HCC) 05/08/2012   HSV infection 06/22/2011    PCP: Ivonne Andrew, NP  REFERRING PROVIDER: Teryl Lucy, MD  REFERRING DIAG: S/P Right Reverse Total Shoulder Arthroplasty DOS 10/30/2022  THERAPY DIAG:  Right shoulder pain, unspecified chronicity  Muscle  weakness (generalized)  Stiffness of right shoulder, not elsewhere classified  Rationale for Evaluation and Treatment: Rehabilitation  ONSET DATE: s/p rTSA 10/30/22  SUBJECTIVE:                                                                                                                                                                                     Per eval - Pt reports ongoing shoulder pain since August 2024 which led  to rTSA on 10/30/22. Since surgery has been using sling most of the time although she states at recent follow up with surgeon she was cleared to be out of it while sitting. Has been doing elbow/hand exercises. States she is able to do majority of self care tasks without assist but does have increased difficulty and is avoiding liting/heavier tasks as expected post op.  Hand dominance: Right  SUBJECTIVE STATEMENT: Patient reports that she drove to her appt today with no issues, is not wearing sling today.     PERTINENT HISTORY: MS, rSTA 10/30/22  PAIN:  Are you having pain: no pain Location/description: lateral/superior shoulder R  Best-worst over past week: 0-5/10, calms quickly   - aggravating factors: general activity, difficulty describing otherwise - Easing factors: rest, sling, medication  PRECAUTIONS: R shoulder reverse TSA 6 weeks: 12/11/22 8 weeks: 12/25/22  WEIGHT BEARING RESTRICTIONS: Yes assumed NWB on surgical limb  FALLS:  Has patient fallen in last 6 months? No  LIVING ENVIRONMENT: Third floor apartment with three flights of stairs 59 year old son lives with her  OCCUPATION: Working prior to surgery - operator/lead on packing line at [employer]; lots of time up on feet, has to lift fairly heavy items at times   PLOF: Independent  PATIENT GOALS:  be able to use shoulder more again  NEXT MD VISIT: December 16th  OBJECTIVE:  Note: Objective measures were completed at Evaluation unless otherwise noted.  DIAGNOSTIC FINDINGS:  S/p rTSA  10/30/22  PATIENT SURVEYS:  FOTO 34 current, 63 predicted 12/11/22 FOTO 50  COGNITION: Overall cognitive status: Within functional limits for tasks assessed     SENSATION: No sensory complaints, not formally tested  POSTURE: Guarded posture RUE as expected, mild forward head and increased thoracic kyphosis  UPPER EXTREMITY ROM:  A/PROM Right eval Left eval Right 11/27/22 Right 11/30/22  Shoulder flexion P: ~60 deg limited by muscle guarding   P: ~80 deg P: 96 deg painless with towel slides at table  Shoulder abduction P: ~70 deg  P: ~80 deg   Shoulder internal rotation NT     Shoulder external rotation NT     Elbow flexion      Elbow extension      Wrist flexion      Wrist extension       (Blank rows = not tested) (Key: WFL = within functional limits not formally assessed, * = concordant pain, s = stiffness/stretching sensation, NT = not tested)  Comments: pt denies pain with ROM  UPPER EXTREMITY MMT:  MMT Right eval Left eval  Shoulder flexion    Shoulder extension    Shoulder abduction    Shoulder extension    Shoulder internal rotation    Shoulder external rotation    Elbow flexion    Elbow extension    Grip strength    (Blank rows = not tested)  (Key: WFL = within functional limits not formally assessed, * = concordant pain, s = stiffness/stretching sensation, NT = not tested)  Comments: deferred on eval given acuity of surgery  SHOULDER SPECIAL TESTS: Deferred given acuity of surgery  JOINT MOBILITY TESTING:  Deferred given acuity of surgery  PALPATION:  Deferred - visual inspection of incision covered in steristrips, no apparent erythema or swelling, grossly WNL   TODAY'S TREATMENT:            [facility] Adult PT Treatment:  DATE: 12/13/22 Therapeutic Exercise: Supine dowel shoulder flexion AAROM x10 towel to mitigate shoulder extension at resting position Supine clasped shoulder flexion AAROM w/ contralat limb  x8 Supine ER PROM w/ towel support and dowel x12 PT assist/cues for appropriate alignment/ROM Standing shoulder flexion isometrics x12, submax, cues for positioning/comfort Standing shoulder abduction isometric x12 submax cues for positioning Standing doorway ER stretch 4x30sec with towel support to avoid extension, increased time spent w/ education on home setup and appropriate performance (cue to turn to Lt, wrist on wall to mitigate wrist hyperextension) Manual Therapy: Supine; PROM R GH flex/abduction/ER to pt tolerance - emphasis on ER today   [facility] Adult PT Treatment:                                                DATE: 12/11/22 Therapeutic Exercise: Supine dowel shoulder flexion AAROM x10 towel to mitigate shoulder extension at resting position Supine clasped shoulder flexion AAROM w/ contralat limb x8 Supine ER PROM w/ towel support and dowel x12 PT assist/cues for appropriate alignment/ROM Standing shoulder flexion isometrics x12, submax, cues for positioning/comfort Standing shoulder abduction isometric x12 submax cues for positioning Standing doorway ER stretch 4x30sec with towel support to avoid extension, increased time spent w/ education on home setup and appropriate performance Manual Therapy: Supine; PROM R GH flex/abduction/ER to pt tolerance - emphasis on ER today    [facility] Adult PT Treatment:                                                DATE: 12/05/22 Therapeutic Exercise: Seated fwd passive flexion x10 with towel and slideboard  Seated passive scaption x10 with towel and board  Standing abduction isometric, submax 2x5 with towel at wall cues for setup and appropriate force output Seated ER PROM 2x10 with dowel and towel support for appropriate alignment  Supine shoulder flexion AAROM wide grip dowel x6, cues for appropriate setup and comfortable ROM, PT assisting to avoid shoulder extension in resting position Scapular retraction x15, verbal cues to avoid shoulder  extension but no longer requiring tactile cues Standing shoulder ER PROM at doorway, x5 with PT tactile cues to avoid shoulder extension, x5 with towel to maintain appropriate shoulder positioning  Manual Therapy: Supine; PROM to pt tolerance for GH abduction, scaption, and flexion                                                                                                                                  PATIENT EDUCATION: Education details: rationale for interventions, HEP  Person educated: Patient Education method: Explanation, Demonstration, Tactile cues, Verbal cues Education comprehension: verbalized understanding, returned demonstration, verbal cues required, tactile  cues required, and needs further education    HOME EXERCISE PROGRAM: Access Code: [email or web address] URL: [email or web address] Date: 12/03/2022 Prepared by: Fransisco Hertz  Program Notes - please ensure with scapular retraction elbow does not go into extension (keep tucked at side, may perform in sling to help limit)- with external rotation stretch - keep towel tucked at elbow as done in clinic to avoid going into any shoulder extension  Exercises - Seated Scapular Retraction  - 2-3 x daily - 1 sets - 15 reps - Seated Gripping Towel  - 2-3 x daily - 1 sets - 10 reps - Seated Elbow Flexion and Extension AROM  - 2-3 x daily - 1 sets - 12 reps - Seated Shoulder Flexion Towel Slide at Table Top  - 2-3 x daily - 1 sets - 8 reps - Standing Shoulder External Rotation Stretch in Doorway  - 2-3 x daily - 1 sets - 8 reps  ASSESSMENT:  CLINICAL IMPRESSION: Patient presents to PT reporting minimal current pain, is not wearing sling today and drove to her appointment today with no issues. Session today continued to focus on PROM and AAROM, particularly in ER as this remains most limited. She continues to require moderate cues to perform ER stretch at doorway. Patient was able to tolerate all prescribed exercises with no  adverse effects. Patient continues to benefit from skilled PT services and should be progressed as able to improve functional independence.    Per eval - Patient is a 57 y.o. woman who was seen today for physical therapy evaluation and treatment for R shoulder pain s/p rTSA 10/30/22. Pt endorses difficulty with daily activities and self care as expected postoperatively. Exam limited to PROM today given proximity to surgery, measurements as above. Initiation of HEP with emphasis on appropriate mechanics within pt precautions, does well with avoiding shoulder extension with scapular retraction. Tolerates session quite well overall. No adverse events, pt reports no pain on departure. Recommend skilled PT to address aforementioned deficits with aim of improving functional tolerance and reducing pain with typical activities. Pt departs today's session in no acute distress, all voiced concerns/questions addressed appropriately from PT perspective.      OBJECTIVE IMPAIRMENTS: decreased activity tolerance, decreased endurance, decreased mobility, decreased ROM, decreased strength, impaired UE functional use, postural dysfunction, and pain.   ACTIVITY LIMITATIONS: carrying, lifting, sleeping, bathing, reach over head, and hygiene/grooming  PARTICIPATION LIMITATIONS: meal prep, cleaning, laundry, driving, community activity, and occupation  PERSONAL FACTORS: Time since onset of injury/illness/exacerbation and 1 comorbidity: MS  are also affecting patient's functional outcome.   REHAB POTENTIAL: Good  CLINICAL DECISION MAKING: Stable/uncomplicated  EVALUATION COMPLEXITY: Low   GOALS: Goals reviewed with patient? Yes  SHORT TERM GOALS: Target date: 12/19/2022 Pt will demonstrate appropriate understanding and performance of initially prescribed HEP in order to facilitate improved independence with management of symptoms.  Baseline: HEP provided on eval Goal status: INITIAL   2. Pt will score greater  than or equal to 49 on FOTO in order to demonstrate improved perception of function due to symptoms.  Baseline: 34  Goal status: INITIAL    LONG TERM GOALS: Target date: 01/16/2023 Pt will score 63 on FOTO in order to demonstrate improved perception of function due to symptoms. Baseline: 34 Goal status: INITIAL  2.  Pt will demonstrate at least 140 degrees of active shoulder elevation in order to demonstrate improved tolerance to functional movement patterns such as overhead reaching and upper body dressing.  Baseline: see ROM chart above -  PROM only on eval Goal status: INITIAL  3.  Pt will demonstrate at least 4+/5 shoulder flex/abduction MMT for improved symmetry of UE strength and improved tolerance to functional movements.  Baseline: deferred on eval given proximity to surgery Goal status: INITIAL  4. Pt will report/demonstrate ability to perform upper body dressing with less than 2 point increase in pain on NPS in order to indicate improved tolerance/independence with ADLs.  Baseline: increased difficulty/modification required  Goal status: INITIAL   5. Pt will report at least 50% decrease in overall pain levels in past week in order to facilitate improved tolerance to basic ADLs/mobility.   Baseline: 0-5/10  Goal status: INITIAL    6. Pt will demonstrate appropriate performance of final prescribed HEP in order to facilitate improved self-management of symptoms post-discharge.   Baseline: initial HEP prescribed  Goal status: INITIAL    PLAN:  PT FREQUENCY: 2x/week  PT DURATION: 8 weeks  PLANNED INTERVENTIONS: 97164- PT Re-evaluation, 97110-Therapeutic exercises, 97530- Therapeutic activity, 97112- Neuromuscular re-education, 97535- Self Care, 29528- Manual therapy, Patient/Family education, Balance training, Taping, Dry Needling, Joint mobilization, Spinal mobilization, Scar mobilization, Cryotherapy, and Moist heat  PLAN FOR NEXT SESSION: Review/update HEP PRN. Symptom  modification strategies as indicated/appropriate. No extension/IR. Phase 2 (weeks 4-6) okay for PROM>AAROM, ER to 45 deg max at 0-30 deg abduction, flexion/scaption <120, submax deltoid isometrics   Ashley Murrain PT, DPT 12/13/2022 10:40 AM

## 2022-12-17 ENCOUNTER — Ambulatory Visit: Payer: BC Managed Care – PPO

## 2022-12-18 ENCOUNTER — Other Ambulatory Visit: Payer: Self-pay | Admitting: Nurse Practitioner

## 2022-12-18 DIAGNOSIS — B009 Herpesviral infection, unspecified: Secondary | ICD-10-CM

## 2022-12-18 NOTE — Telephone Encounter (Signed)
Please advise Kh

## 2022-12-19 ENCOUNTER — Ambulatory Visit: Payer: BC Managed Care – PPO

## 2022-12-19 DIAGNOSIS — M25611 Stiffness of right shoulder, not elsewhere classified: Secondary | ICD-10-CM

## 2022-12-19 DIAGNOSIS — M25511 Pain in right shoulder: Secondary | ICD-10-CM | POA: Diagnosis not present

## 2022-12-19 DIAGNOSIS — M6281 Muscle weakness (generalized): Secondary | ICD-10-CM

## 2022-12-19 NOTE — Therapy (Signed)
OUTPATIENT PHYSICAL THERAPY NOTE   Patient Name: Kelly Grant MRN: [id number] DOB:11-13-1965, 57 y.o., female Today's Date: 12/19/2022  END OF SESSION:  PT End of Session - 12/19/22 1534     Visit Number 8    Number of Visits 17    Date for PT Re-Evaluation 01/16/23    Authorization Type [employer]    Authorization Time Period 15 visits approved 11/26/22-02/24/23    Authorization - Visit Number 7    Authorization - Number of Visits 15    PT Start Time 1531    PT Stop Time 1611    PT Time Calculation (min) 40 min    Activity Tolerance Patient tolerated treatment well;No increased pain    Behavior During Therapy WFL for tasks assessed/performed                Past Medical History:  Diagnosis Date   Abnormal Pap smear    ASC-H   Arthritis    Chlamydia    History of chicken pox    HSV infection    Multiple sclerosis (HCC)    Neuromuscular disorder (HCC)    MS   Yeast infection    Past Surgical History:  Procedure Laterality Date   CARPAL TUNNEL RELEASE Left    in addition to wrist fracture surgery   CESAREAN SECTION     x2   COLONOSCOPY  2016   DJ-MAC-movi(exc)-TA   INCISION AND DRAINAGE ABSCESS Left    left buttock   POLYPECTOMY  2016   TA   REVERSE SHOULDER ARTHROPLASTY Right 10/30/2022   Procedure: REVERSE SHOULDER ARTHROPLASTY;  Surgeon: Teryl Lucy, MD;  Location: [facility];  Service: Orthopedics;  Laterality: Right;   TUBAL LIGATION     Patient Active Problem List   Diagnosis Date Noted   Diabetes mellitus screening 10/01/2022   ASCUS with positive high risk HPV cervical 05/07/2022   Hyperlipidemia 09/29/2021   Encounter for therapeutic drug monitoring 12/13/2014   Multiple sclerosis (HCC) 05/08/2012   HSV infection 06/22/2011    PCP: Ivonne Andrew, NP  REFERRING PROVIDER: Teryl Lucy, MD  REFERRING DIAG: S/P Right Reverse Total Shoulder Arthroplasty DOS 10/30/2022  THERAPY DIAG:  Right shoulder pain, unspecified chronicity  Muscle  weakness (generalized)  Stiffness of right shoulder, not elsewhere classified  Rationale for Evaluation and Treatment: Rehabilitation  ONSET DATE: s/p rTSA 10/30/22  SUBJECTIVE:                                                                                                                                                                                     Per eval - Pt reports ongoing shoulder pain since August 2024 which  led to rTSA on 10/30/22. Since surgery has been using sling most of the time although she states at recent follow up with surgeon she was cleared to be out of it while sitting. Has been doing elbow/hand exercises. States she is able to do majority of self care tasks without assist but does have increased difficulty and is avoiding liting/heavier tasks as expected post op.  Hand dominance: Right  SUBJECTIVE STATEMENT: Pt presents to PT with reports of no shoulder pain. Pt   PERTINENT HISTORY: MS, rSTA 10/30/22  PAIN:  Are you having pain: no pain Location/description: lateral/superior shoulder R  Best-worst over past week: 0-5/10, calms quickly   - aggravating factors: general activity, difficulty describing otherwise - Easing factors: rest, sling, medication  PRECAUTIONS: R shoulder reverse TSA 6 weeks: 12/11/22 8 weeks: 12/25/22  WEIGHT BEARING RESTRICTIONS: Yes assumed NWB on surgical limb  FALLS:  Has patient fallen in last 6 months? No  LIVING ENVIRONMENT: Third floor apartment with three flights of stairs 17 year old son lives with her  OCCUPATION: Working prior to surgery - operator/lead on packing line at [employer]; lots of time up on feet, has to lift fairly heavy items at times   PLOF: Independent  PATIENT GOALS:  be able to use shoulder more again  NEXT MD VISIT: December 16th  OBJECTIVE:  Note: Objective measures were completed at Evaluation unless otherwise noted.  DIAGNOSTIC FINDINGS:  S/p rTSA 10/30/22  PATIENT SURVEYS:  FOTO 34 current,  63 predicted 12/11/22 FOTO 50  COGNITION: Overall cognitive status: Within functional limits for tasks assessed     SENSATION: No sensory complaints, not formally tested  POSTURE: Guarded posture RUE as expected, mild forward head and increased thoracic kyphosis  UPPER EXTREMITY ROM:  A/PROM Right eval Left eval Right 11/27/22 Right 11/30/22  Shoulder flexion P: ~60 deg limited by muscle guarding   P: ~80 deg P: 96 deg painless with towel slides at table  Shoulder abduction P: ~70 deg  P: ~80 deg   Shoulder internal rotation NT     Shoulder external rotation NT     Elbow flexion      Elbow extension      Wrist flexion      Wrist extension       (Blank rows = not tested) (Key: WFL = within functional limits not formally assessed, * = concordant pain, s = stiffness/stretching sensation, NT = not tested)  Comments: pt denies pain with ROM  UPPER EXTREMITY MMT:  MMT Right eval Left eval  Shoulder flexion    Shoulder extension    Shoulder abduction    Shoulder extension    Shoulder internal rotation    Shoulder external rotation    Elbow flexion    Elbow extension    Grip strength    (Blank rows = not tested)  (Key: WFL = within functional limits not formally assessed, * = concordant pain, s = stiffness/stretching sensation, NT = not tested)  Comments: deferred on eval given acuity of surgery  SHOULDER SPECIAL TESTS: Deferred given acuity of surgery  JOINT MOBILITY TESTING:  Deferred given acuity of surgery  PALPATION:  Deferred - visual inspection of incision covered in steristrips, no apparent erythema or swelling, grossly WNL   TODAY'S TREATMENT:            [facility] Adult PT Treatment:  DATE: 12/19/22 Therapeutic Exercise: Seated cane ER AAROM 2x15 Supine dowel shoulder flexion AAROM 2x10 towel to mitigate shoulder extension at resting position Supine clasped shoulder flexion AAROM w/ contralat limb x 10 Supine ER  PROM w/ towel support and dowel x12 PT assist/cues for appropriate alignment/ROM Manual Therapy: Supine; PROM R GH flex/abduction/ER to pt tolerance - emphasis on ER today  [facility] Adult PT Treatment:                                                DATE: 12/13/22 Therapeutic Exercise: Supine dowel shoulder flexion AAROM x10 towel to mitigate shoulder extension at resting position Supine clasped shoulder flexion AAROM w/ contralat limb x8 Supine ER PROM w/ towel support and dowel x12 PT assist/cues for appropriate alignment/ROM Standing shoulder flexion isometrics x12, submax, cues for positioning/comfort Standing shoulder abduction isometric x12 submax cues for positioning Standing doorway ER stretch 4x30sec with towel support to avoid extension, increased time spent w/ education on home setup and appropriate performance (cue to turn to Lt, wrist on wall to mitigate wrist hyperextension) Manual Therapy: Supine; PROM R GH flex/abduction/ER to pt tolerance - emphasis on ER today   [facility] Adult PT Treatment:                                                DATE: 12/11/22 Therapeutic Exercise: Supine dowel shoulder flexion AAROM x10 towel to mitigate shoulder extension at resting position Supine clasped shoulder flexion AAROM w/ contralat limb x8 Supine ER PROM w/ towel support and dowel x12 PT assist/cues for appropriate alignment/ROM Standing shoulder flexion isometrics x12, submax, cues for positioning/comfort Standing shoulder abduction isometric x12 submax cues for positioning Standing doorway ER stretch 4x30sec with towel support to avoid extension, increased time spent w/ education on home setup and appropriate performance Manual Therapy: Supine; PROM R GH flex/abduction/ER to pt tolerance - emphasis on ER today    [facility] Adult PT Treatment:                                                DATE: 12/05/22 Therapeutic Exercise: Seated fwd passive flexion x10 with towel and slideboard  Seated  passive scaption x10 with towel and board  Standing abduction isometric, submax 2x5 with towel at wall cues for setup and appropriate force output Seated ER PROM 2x10 with dowel and towel support for appropriate alignment  Supine shoulder flexion AAROM wide grip dowel x6, cues for appropriate setup and comfortable ROM, PT assisting to avoid shoulder extension in resting position Scapular retraction x15, verbal cues to avoid shoulder extension but no longer requiring tactile cues Standing shoulder ER PROM at doorway, x5 with PT tactile cues to avoid shoulder extension, x5 with towel to maintain appropriate shoulder positioning  Manual Therapy: Supine; PROM to pt tolerance for GH abduction, scaption, and flexion  PATIENT EDUCATION: Education details: rationale for interventions, HEP  Person educated: Patient Education method: Explanation, Demonstration, Tactile cues, Verbal cues Education comprehension: verbalized understanding, returned demonstration, verbal cues required, tactile cues required, and needs further education    HOME EXERCISE PROGRAM: Access Code: [email or web address] URL: [email or web address] Date: 12/03/2022 Prepared by: Fransisco Hertz  Program Notes - please ensure with scapular retraction elbow does not go into extension (keep tucked at side, may perform in sling to help limit)- with external rotation stretch - keep towel tucked at elbow as done in clinic to avoid going into any shoulder extension  Exercises - Seated Scapular Retraction  - 2-3 x daily - 1 sets - 15 reps - Seated Gripping Towel  - 2-3 x daily - 1 sets - 10 reps - Seated Elbow Flexion and Extension AROM  - 2-3 x daily - 1 sets - 12 reps - Seated Shoulder Flexion Towel Slide at Table Top  - 2-3 x daily - 1 sets - 8 reps - Standing Shoulder External Rotation Stretch in Doorway   - 2-3 x daily - 1 sets - 8 reps  ASSESSMENT:  CLINICAL IMPRESSION: Pt tolerated treatment well today, continues to be limited in flex/abd/ER of R shoulder post surgery. Continues to benefit from skilled PT services, will continue to be seen per POC.    Per eval - Patient is a 57 y.o. woman who was seen today for physical therapy evaluation and treatment for R shoulder pain s/p rTSA 10/30/22. Pt endorses difficulty with daily activities and self care as expected postoperatively. Exam limited to PROM today given proximity to surgery, measurements as above. Initiation of HEP with emphasis on appropriate mechanics within pt precautions, does well with avoiding shoulder extension with scapular retraction. Tolerates session quite well overall. No adverse events, pt reports no pain on departure. Recommend skilled PT to address aforementioned deficits with aim of improving functional tolerance and reducing pain with typical activities. Pt departs today's session in no acute distress, all voiced concerns/questions addressed appropriately from PT perspective.      OBJECTIVE IMPAIRMENTS: decreased activity tolerance, decreased endurance, decreased mobility, decreased ROM, decreased strength, impaired UE functional use, postural dysfunction, and pain.   ACTIVITY LIMITATIONS: carrying, lifting, sleeping, bathing, reach over head, and hygiene/grooming  PARTICIPATION LIMITATIONS: meal prep, cleaning, laundry, driving, community activity, and occupation  PERSONAL FACTORS: Time since onset of injury/illness/exacerbation and 1 comorbidity: MS  are also affecting patient's functional outcome.   REHAB POTENTIAL: Good  CLINICAL DECISION MAKING: Stable/uncomplicated  EVALUATION COMPLEXITY: Low   GOALS: Goals reviewed with patient? Yes  SHORT TERM GOALS: Target date: 12/19/2022 Pt will demonstrate appropriate understanding and performance of initially prescribed HEP in order to facilitate improved independence  with management of symptoms.  Baseline: HEP provided on eval Goal status: INITIAL   2. Pt will score greater than or equal to 49 on FOTO in order to demonstrate improved perception of function due to symptoms.  Baseline: 34  Goal status: INITIAL    LONG TERM GOALS: Target date: 01/16/2023 Pt will score 63 on FOTO in order to demonstrate improved perception of function due to symptoms. Baseline: 34 Goal status: INITIAL  2.  Pt will demonstrate at least 140 degrees of active shoulder elevation in order to demonstrate improved tolerance to functional movement patterns such as overhead reaching and upper body dressing.  Baseline: see ROM chart above - PROM only on eval Goal status: INITIAL  3.  Pt will demonstrate at least 4+/5 shoulder flex/abduction MMT for improved  symmetry of UE strength and improved tolerance to functional movements.  Baseline: deferred on eval given proximity to surgery Goal status: INITIAL  4. Pt will report/demonstrate ability to perform upper body dressing with less than 2 point increase in pain on NPS in order to indicate improved tolerance/independence with ADLs.  Baseline: increased difficulty/modification required  Goal status: INITIAL   5. Pt will report at least 50% decrease in overall pain levels in past week in order to facilitate improved tolerance to basic ADLs/mobility.   Baseline: 0-5/10  Goal status: INITIAL    6. Pt will demonstrate appropriate performance of final prescribed HEP in order to facilitate improved self-management of symptoms post-discharge.   Baseline: initial HEP prescribed  Goal status: INITIAL    PLAN:  PT FREQUENCY: 2x/week  PT DURATION: 8 weeks  PLANNED INTERVENTIONS: 97164- PT Re-evaluation, 97110-Therapeutic exercises, 97530- Therapeutic activity, 97112- Neuromuscular re-education, 97535- Self Care, 16109- Manual therapy, Patient/Family education, Balance training, Taping, Dry Needling, Joint mobilization, Spinal  mobilization, Scar mobilization, Cryotherapy, and Moist heat  PLAN FOR NEXT SESSION: Review/update HEP PRN. Symptom modification strategies as indicated/appropriate. No extension/IR. Phase 2 (weeks 4-6) okay for PROM>AAROM, ER to 45 deg max at 0-30 deg abduction, flexion/scaption <120, submax deltoid isometrics   Eloy End PT  12/19/22 4:14 PM

## 2022-12-25 ENCOUNTER — Ambulatory Visit: Payer: Medicaid Other | Attending: Orthopedic Surgery

## 2022-12-25 DIAGNOSIS — M6281 Muscle weakness (generalized): Secondary | ICD-10-CM | POA: Diagnosis not present

## 2022-12-25 DIAGNOSIS — M25511 Pain in right shoulder: Secondary | ICD-10-CM | POA: Diagnosis not present

## 2022-12-25 DIAGNOSIS — M25611 Stiffness of right shoulder, not elsewhere classified: Secondary | ICD-10-CM | POA: Diagnosis not present

## 2022-12-25 NOTE — Therapy (Signed)
OUTPATIENT PHYSICAL THERAPY NOTE   Patient Name: Kelly SHAVERS MRN: [id number] DOB:07/02/1965, 57 y.o., female Today's Date: 12/25/2022  END OF SESSION:  PT End of Session - 12/25/22 1444     Visit Number 9    Number of Visits 17    Date for PT Re-Evaluation 01/16/23    Authorization Type [employer]    Authorization Time Period 15 visits approved 11/26/22-02/24/23    Authorization - Visit Number 8    Authorization - Number of Visits 15    PT Start Time 1445    PT Stop Time 1525    PT Time Calculation (min) 40 min    Activity Tolerance Patient tolerated treatment well;No increased pain    Behavior During Therapy WFL for tasks assessed/performed             Past Medical History:  Diagnosis Date   Abnormal Pap smear    ASC-H   Arthritis    Chlamydia    History of chicken pox    HSV infection    Multiple sclerosis (HCC)    Neuromuscular disorder (HCC)    MS   Yeast infection    Past Surgical History:  Procedure Laterality Date   CARPAL TUNNEL RELEASE Left    in addition to wrist fracture surgery   CESAREAN SECTION     x2   COLONOSCOPY  2016   DJ-MAC-movi(exc)-TA   INCISION AND DRAINAGE ABSCESS Left    left buttock   POLYPECTOMY  2016   TA   REVERSE SHOULDER ARTHROPLASTY Right 10/30/2022   Procedure: REVERSE SHOULDER ARTHROPLASTY;  Surgeon: Teryl Lucy, MD;  Location: [facility];  Service: Orthopedics;  Laterality: Right;   TUBAL LIGATION     Patient Active Problem List   Diagnosis Date Noted   Diabetes mellitus screening 10/01/2022   ASCUS with positive high risk HPV cervical 05/07/2022   Hyperlipidemia 09/29/2021   Encounter for therapeutic drug monitoring 12/13/2014   Multiple sclerosis (HCC) 05/08/2012   HSV infection 06/22/2011    PCP: Ivonne Andrew, NP  REFERRING PROVIDER: Teryl Lucy, MD  REFERRING DIAG: S/P Right Reverse Total Shoulder Arthroplasty DOS 10/30/2022  THERAPY DIAG:  Right shoulder pain, unspecified chronicity  Muscle weakness  (generalized)  Stiffness of right shoulder, not elsewhere classified  Rationale for Evaluation and Treatment: Rehabilitation  ONSET DATE: s/p rTSA 10/30/22  SUBJECTIVE:                                                                                                                                                                                     Per eval - Pt reports ongoing shoulder pain since August 2024 which led to rTSA  on 10/30/22. Since surgery has been using sling most of the time although she states at recent follow up with surgeon she was cleared to be out of it while sitting. Has been doing elbow/hand exercises. States she is able to do majority of self care tasks without assist but does have increased difficulty and is avoiding liting/heavier tasks as expected post op.  Hand dominance: Right  SUBJECTIVE STATEMENT: Pt reports no current pain and that she has some difficulty with putting her hair up.   PERTINENT HISTORY: MS, rSTA 10/30/22  PAIN:  Are you having pain: no pain Location/description: lateral/superior shoulder R  Best-worst over past week: 0-5/10, calms quickly   - aggravating factors: general activity, difficulty describing otherwise - Easing factors: rest, sling, medication  PRECAUTIONS: R shoulder reverse TSA 6 weeks: 12/11/22 8 weeks: 12/25/22  WEIGHT BEARING RESTRICTIONS: Yes assumed NWB on surgical limb  FALLS:  Has patient fallen in last 6 months? No  LIVING ENVIRONMENT: Third floor apartment with three flights of stairs 84 year old son lives with her  OCCUPATION: Working prior to surgery - operator/lead on packing line at [employer]; lots of time up on feet, has to lift fairly heavy items at times   PLOF: Independent  PATIENT GOALS:  be able to use shoulder more again  NEXT MD VISIT: December 16th  OBJECTIVE:  Note: Objective measures were completed at Evaluation unless otherwise noted.  DIAGNOSTIC FINDINGS:  S/p rTSA 10/30/22  PATIENT  SURVEYS:  FOTO 34 current, 63 predicted 12/11/22 FOTO 50  COGNITION: Overall cognitive status: Within functional limits for tasks assessed     SENSATION: No sensory complaints, not formally tested  POSTURE: Guarded posture RUE as expected, mild forward head and increased thoracic kyphosis  UPPER EXTREMITY ROM:  A/PROM Right eval Left eval Right 11/27/22 Right 11/30/22  Shoulder flexion P: ~60 deg limited by muscle guarding   P: ~80 deg P: 96 deg painless with towel slides at table  Shoulder abduction P: ~70 deg  P: ~80 deg   Shoulder internal rotation NT     Shoulder external rotation NT     Elbow flexion      Elbow extension      Wrist flexion      Wrist extension       (Blank rows = not tested) (Key: WFL = within functional limits not formally assessed, * = concordant pain, s = stiffness/stretching sensation, NT = not tested)  Comments: pt denies pain with ROM  UPPER EXTREMITY MMT:  MMT Right eval Left eval  Shoulder flexion    Shoulder extension    Shoulder abduction    Shoulder extension    Shoulder internal rotation    Shoulder external rotation    Elbow flexion    Elbow extension    Grip strength    (Blank rows = not tested)  (Key: WFL = within functional limits not formally assessed, * = concordant pain, s = stiffness/stretching sensation, NT = not tested)  Comments: deferred on eval given acuity of surgery  SHOULDER SPECIAL TESTS: Deferred given acuity of surgery  JOINT MOBILITY TESTING:  Deferred given acuity of surgery  PALPATION:  Deferred - visual inspection of incision covered in steristrips, no apparent erythema or swelling, grossly WNL   TODAY'S TREATMENT:   [facility] Adult PT Treatment:  DATE: 12/25/22 Therapeutic Exercise: Seated cane ER AAROM 2x15 Seated scap retraction 2x10 Supine dowel shoulder flexion AAROM 2x10 towel to mitigate shoulder extension at resting position Supine clasped shoulder  flexion AAROM w/ contralat limb x 10 Supine ER PROM w/ towel support and dowel x15 PT assist/cues for appropriate alignment/ROM Supine shoulder flexion AROM holding tennis ball x10 (very small ROM) Supine press AROM holding tennis ball 2x10 Manual Therapy: Supine; PROM R GH flex/abduction/ER to pt tolerance - emphasis on ER today            [facility] Adult PT Treatment:                                                DATE: 12/19/22 Therapeutic Exercise: Seated cane ER AAROM 2x15 Supine dowel shoulder flexion AAROM 2x10 towel to mitigate shoulder extension at resting position Supine clasped shoulder flexion AAROM w/ contralat limb x 10 Supine ER PROM w/ towel support and dowel x12 PT assist/cues for appropriate alignment/ROM Manual Therapy: Supine; PROM R GH flex/abduction/ER to pt tolerance - emphasis on ER today  [facility] Adult PT Treatment:                                                DATE: 12/13/22 Therapeutic Exercise: Supine dowel shoulder flexion AAROM x10 towel to mitigate shoulder extension at resting position Supine clasped shoulder flexion AAROM w/ contralat limb x8 Supine ER PROM w/ towel support and dowel x12 PT assist/cues for appropriate alignment/ROM Standing shoulder flexion isometrics x12, submax, cues for positioning/comfort Standing shoulder abduction isometric x12 submax cues for positioning Standing doorway ER stretch 4x30sec with towel support to avoid extension, increased time spent w/ education on home setup and appropriate performance (cue to turn to Lt, wrist on wall to mitigate wrist hyperextension) Manual Therapy: Supine; PROM R GH flex/abduction/ER to pt tolerance - emphasis on ER today                                                                                                                                 PATIENT EDUCATION: Education details: rationale for interventions, HEP  Person educated: Patient Education method: Explanation, Demonstration, Tactile  cues, Verbal cues Education comprehension: verbalized understanding, returned demonstration, verbal cues required, tactile cues required, and needs further education    HOME EXERCISE PROGRAM: Access Code: [email or web address] URL: [email or web address] Date: 12/03/2022 Prepared by: Fransisco Hertz  Program Notes - please ensure with scapular retraction elbow does not go into extension (keep tucked at side, may perform in sling to help limit)- with external rotation stretch - keep towel tucked at elbow as done in clinic to avoid going into any shoulder extension  Exercises -  Seated Scapular Retraction  - 2-3 x daily - 1 sets - 15 reps - Seated Gripping Towel  - 2-3 x daily - 1 sets - 10 reps - Seated Elbow Flexion and Extension AROM  - 2-3 x daily - 1 sets - 12 reps - Seated Shoulder Flexion Towel Slide at Table Top  - 2-3 x daily - 1 sets - 8 reps - Standing Shoulder External Rotation Stretch in Doorway  - 2-3 x daily - 1 sets - 8 reps  ASSESSMENT:  CLINICAL IMPRESSION: Patient presents to PT reporting no current pain and that she notices more stiffness than pain with OH motions like putting her hair up. [employer] AROM today to good effect, no increase in pain throughout, only endorses muscular fatigue. She has very limited ROM with active flexion, reports that the arm just feels "heavy" but is not painful. Continued working on [employer] and PROM as well, particularly into ER. Patient was able to tolerate all prescribed exercises with no adverse effects. Patient continues to benefit from skilled PT services and should be progressed as able to improve functional independence.    Per eval - Patient is a 57 y.o. woman who was seen today for physical therapy evaluation and treatment for R shoulder pain s/p rTSA 10/30/22. Pt endorses difficulty with daily activities and self care as expected postoperatively. Exam limited to PROM today given proximity to surgery, measurements as above. Initiation of  HEP with emphasis on appropriate mechanics within pt precautions, does well with avoiding shoulder extension with scapular retraction. Tolerates session quite well overall. No adverse events, pt reports no pain on departure. Recommend skilled PT to address aforementioned deficits with aim of improving functional tolerance and reducing pain with typical activities. Pt departs today's session in no acute distress, all voiced concerns/questions addressed appropriately from PT perspective.      OBJECTIVE IMPAIRMENTS: decreased activity tolerance, decreased endurance, decreased mobility, decreased ROM, decreased strength, impaired UE functional use, postural dysfunction, and pain.   ACTIVITY LIMITATIONS: carrying, lifting, sleeping, bathing, reach over head, and hygiene/grooming  PARTICIPATION LIMITATIONS: meal prep, cleaning, laundry, driving, community activity, and occupation  PERSONAL FACTORS: Time since onset of injury/illness/exacerbation and 1 comorbidity: MS  are also affecting patient's functional outcome.   REHAB POTENTIAL: Good  CLINICAL DECISION MAKING: Stable/uncomplicated  EVALUATION COMPLEXITY: Low   GOALS: Goals reviewed with patient? Yes  SHORT TERM GOALS: Target date: 12/19/2022 Pt will demonstrate appropriate understanding and performance of initially prescribed HEP in order to facilitate improved independence with management of symptoms.  Baseline: HEP provided on eval Goal status: INITIAL   2. Pt will score greater than or equal to 49 on FOTO in order to demonstrate improved perception of function due to symptoms.  Baseline: 34  Goal status: INITIAL    LONG TERM GOALS: Target date: 01/16/2023 Pt will score 63 on FOTO in order to demonstrate improved perception of function due to symptoms. Baseline: 34 Goal status: INITIAL  2.  Pt will demonstrate at least 140 degrees of active shoulder elevation in order to demonstrate improved tolerance to functional movement  patterns such as overhead reaching and upper body dressing.  Baseline: see ROM chart above - PROM only on eval Goal status: INITIAL  3.  Pt will demonstrate at least 4+/5 shoulder flex/abduction MMT for improved symmetry of UE strength and improved tolerance to functional movements.  Baseline: deferred on eval given proximity to surgery Goal status: INITIAL  4. Pt will report/demonstrate ability to perform upper body dressing with  less than 2 point increase in pain on NPS in order to indicate improved tolerance/independence with ADLs.  Baseline: increased difficulty/modification required  Goal status: INITIAL   5. Pt will report at least 50% decrease in overall pain levels in past week in order to facilitate improved tolerance to basic ADLs/mobility.   Baseline: 0-5/10  Goal status: INITIAL    6. Pt will demonstrate appropriate performance of final prescribed HEP in order to facilitate improved self-management of symptoms post-discharge.   Baseline: initial HEP prescribed  Goal status: INITIAL    PLAN:  PT FREQUENCY: 2x/week  PT DURATION: 8 weeks  PLANNED INTERVENTIONS: 97164- PT Re-evaluation, 97110-Therapeutic exercises, 97530- Therapeutic activity, 97112- Neuromuscular re-education, 97535- Self Care, 09323- Manual therapy, Patient/Family education, Balance training, Taping, Dry Needling, Joint mobilization, Spinal mobilization, Scar mobilization, Cryotherapy, and Moist heat  PLAN FOR NEXT SESSION: Review/update HEP PRN. Symptom modification strategies as indicated/appropriate. No extension/IR. Phase 2 (weeks 4-6) okay for PROM>AAROM, ER to 45 deg max at 0-30 deg abduction, flexion/scaption <120, submax deltoid isometrics   Berta Minor PTA  12/25/22 3:24 PM

## 2022-12-27 ENCOUNTER — Other Ambulatory Visit: Payer: Self-pay

## 2022-12-27 ENCOUNTER — Other Ambulatory Visit (HOSPITAL_COMMUNITY): Payer: Self-pay

## 2022-12-27 NOTE — Therapy (Signed)
OUTPATIENT PHYSICAL PROGRESS NOTE + RECERTIFICATION   Patient Name: Kelly Grant MRN: [id number] DOB:03-11-1965, 57 y.o., female Today's Date: 12/28/2022   Progress Note Reporting Period 11/21/22 to 12/28/22  See note below for Objective Data and Assessment of Progress/Goals.      END OF SESSION:  PT End of Session - 12/28/22 1014     Visit Number 10    Number of Visits 22    Date for PT Re-Evaluation 02/08/23    Authorization Type [employer]    Authorization Time Period 15 visits approved 11/26/22-02/24/23    Authorization - Visit Number 9    Authorization - Number of Visits 15    PT Start Time 1015    PT Stop Time 1102    PT Time Calculation (min) 47 min    Activity Tolerance Patient tolerated treatment well;No increased pain    Behavior During Therapy WFL for tasks assessed/performed              Past Medical History:  Diagnosis Date   Abnormal Pap smear    ASC-H   Arthritis    Chlamydia    History of chicken pox    HSV infection    Multiple sclerosis (HCC)    Neuromuscular disorder (HCC)    MS   Yeast infection    Past Surgical History:  Procedure Laterality Date   CARPAL TUNNEL RELEASE Left    in addition to wrist fracture surgery   CESAREAN SECTION     x2   COLONOSCOPY  2016   DJ-MAC-movi(exc)-TA   INCISION AND DRAINAGE ABSCESS Left    left buttock   POLYPECTOMY  2016   TA   REVERSE SHOULDER ARTHROPLASTY Right 10/30/2022   Procedure: REVERSE SHOULDER ARTHROPLASTY;  Surgeon: Teryl Lucy, MD;  Location: [facility];  Service: Orthopedics;  Laterality: Right;   TUBAL LIGATION     Patient Active Problem List   Diagnosis Date Noted   Diabetes mellitus screening 10/01/2022   ASCUS with positive high risk HPV cervical 05/07/2022   Hyperlipidemia 09/29/2021   Encounter for therapeutic drug monitoring 12/13/2014   Multiple sclerosis (HCC) 05/08/2012   HSV infection 06/22/2011    PCP: Ivonne Andrew, NP  REFERRING PROVIDER: Teryl Lucy,  MD  REFERRING DIAG: S/P Right Reverse Total Shoulder Arthroplasty DOS 10/30/2022  THERAPY DIAG:  Right shoulder pain, unspecified chronicity  Muscle weakness (generalized)  Stiffness of right shoulder, not elsewhere classified  Rationale for Evaluation and Treatment: Rehabilitation  ONSET DATE: s/p rTSA 10/30/22  SUBJECTIVE:  Per eval - Pt reports ongoing shoulder pain since August 2024 which led to rTSA on 10/30/22. Since surgery has been using sling most of the time although she states at recent follow up with surgeon she was cleared to be out of it while sitting. Has been doing elbow/hand exercises. States she is able to do majority of self care tasks without assist but does have increased difficulty and is avoiding liting/heavier tasks as expected post op.  Hand dominance: Right  SUBJECTIVE STATEMENT: 12/28/2022 Not a lot of pain, still having some difficulty w/ daily tasks. States since starting PT she is able to move arm better.    PERTINENT HISTORY: MS, rSTA 10/30/22  PAIN:  Are you having pain: no pain Worst: 4/10 Agg: movement  Per eval -  Location/description: lateral/superior shoulder R  Best-worst over past week: 0-5/10, calms quickly   - aggravating factors: general activity, difficulty describing otherwise - Easing factors: rest, sling, medication  PRECAUTIONS: R shoulder reverse TSA 8 weeks: 12/25/22 10 weeks: 01/08/23 12 weeks: 01/22/23  WEIGHT BEARING RESTRICTIONS: Yes assumed NWB on surgical limb  FALLS:  Has patient fallen in last 6 months? No  LIVING ENVIRONMENT: Third floor apartment with three flights of stairs 67 year old son lives with her  OCCUPATION: Working prior to surgery - operator/lead on packing line at [employer]; lots of time up on feet, has to lift fairly heavy  items at times   PLOF: Independent  PATIENT GOALS:  be able to use shoulder more again  NEXT MD VISIT: December 16th  OBJECTIVE:  Note: Objective measures were completed at Evaluation unless otherwise noted.  DIAGNOSTIC FINDINGS:  S/p rTSA 10/30/22  PATIENT SURVEYS:  FOTO 34 current, 63 predicted 12/11/22 FOTO 50 12/28/22: FOTO 50  COGNITION: Overall cognitive status: Within functional limits for tasks assessed     SENSATION: No sensory complaints, not formally tested  POSTURE: Guarded posture RUE as expected, mild forward head and increased thoracic kyphosis  UPPER EXTREMITY ROM:  A/PROM Right eval Left eval Right 11/27/22 Right 11/30/22 Right 12/28/22  Shoulder flexion P: ~60 deg limited by muscle guarding   P: ~80 deg P: 96 deg painless with towel slides at table A: 88 deg (more scaption due to ER deficits)  Shoulder abduction P: ~70 deg  P: ~80 deg  P: 98 deg without pain  Shoulder internal rotation NT      Shoulder external rotation NT    P: neutral without pain, limited by stiffness  Elbow flexion       Elbow extension       Wrist flexion       Wrist extension        (Blank rows = not tested) (Key: WFL = within functional limits not formally assessed, * = concordant pain, s = stiffness/stretching sensation, NT = not tested)  Comments: pt denies pain with ROM  UPPER EXTREMITY MMT:  MMT Right eval Left eval  Shoulder flexion    Shoulder extension    Shoulder abduction    Shoulder extension    Shoulder internal rotation    Shoulder external rotation    Elbow flexion    Elbow extension    Grip strength    (Blank rows = not tested)  (Key: WFL = within functional limits not formally assessed, * = concordant pain, s = stiffness/stretching sensation, NT = not tested)  Comments: deferred on eval given acuity of surgery  SHOULDER SPECIAL TESTS: Deferred given acuity of surgery  JOINT MOBILITY TESTING:  Deferred given acuity of surgery  PALPATION:   Deferred - visual inspection of incision covered in steristrips, no apparent erythema or swelling, grossly WNL   TODAY'S TREATMENT:   [facility] Adult PT Treatment:                                                DATE: 12/28/22 Therapeutic Exercise: Supine punches AROM 2x8 cues for maintaining alignment Supine shoulder flexion AROM R GH to tolerated ROM, 3x5 cues for alignment Sidelying R GH ER to neutral w. Towel at elbow for alignment x10 Dowel ER AAROM 2x15 cues for comfortable ROM, seated Finger ladder x8 R GH emphasis on reducing scapular compensations, elbow alignment (PR 21)  Manual Therapy: Supine; PROM R GH flex/abduction/ER to pt tolerance - continued emphasis on ER  Therapeutic Activity: FOTO + education MSK assessment + education Education/discussion re: progress with PT, symptom behavior as it affects activity tolerance, PT goals/POC                                                                           PATIENT EDUCATION: Education details: rationale for interventions, HEP, progress note activities, PT goals/POC Person educated: Patient Education method: Explanation, Demonstration, Tactile cues, Verbal cues Education comprehension: verbalized understanding, returned demonstration, verbal cues required, tactile cues required, and needs further education    HOME EXERCISE PROGRAM: Access Code: [email or web address]   ASSESSMENT:  CLINICAL IMPRESSION: 12/28/2022 Pt arrives w/o pain, endorsing slow/steady progress w/ PT, now >8 weeks post op. On exam she does demonstrate improvements in passive mobility, with AAROM/AROM activities as above she demonstrates significant compensations at elbow and scapula likely attributable in part to continued stiffness of ER; we are able to achieve neutral ER passively w/ fairly significant stiffness although nonpainful. She tolerates progressions into standing AAROM well today without increase in pain but does report stiffness/weakness. No adverse events,  pt denies any pain on departure. Goals addressed as below. Based on current trajectory with shoulder stiffness, recommend extension of POC as outlined below to maximize functional independence and activity tolerance. Pt departs today's session in no acute distress, all voiced questions/concerns addressed appropriately from PT perspective.     Per eval - Patient is a 58 y.o. woman who was seen today for physical therapy evaluation and treatment for R shoulder pain s/p rTSA 10/30/22. Pt endorses difficulty with daily activities and self care as expected postoperatively. Exam limited to PROM today given proximity to surgery, measurements as above. Initiation of HEP with emphasis on appropriate mechanics within pt precautions, does well with avoiding shoulder extension with scapular retraction. Tolerates session quite well overall. No adverse events, pt reports no pain on departure. Recommend skilled PT to address aforementioned deficits with aim of improving functional tolerance and reducing pain with typical activities. Pt departs today's session in no acute distress, all voiced concerns/questions addressed appropriately from PT perspective.      OBJECTIVE IMPAIRMENTS: decreased activity tolerance, decreased endurance, decreased mobility, decreased ROM, decreased strength, impaired UE functional use, postural dysfunction, and pain.   ACTIVITY LIMITATIONS: carrying, lifting, sleeping, bathing, reach over head, and  hygiene/grooming  PARTICIPATION LIMITATIONS: meal prep, cleaning, laundry, driving, community activity, and occupation  PERSONAL FACTORS: Time since onset of injury/illness/exacerbation and 1 comorbidity: MS  are also affecting patient's functional outcome.   REHAB POTENTIAL: Good  CLINICAL DECISION MAKING: Stable/uncomplicated  EVALUATION COMPLEXITY: Low   GOALS: Goals reviewed with patient? Yes  SHORT TERM GOALS: Target date: 12/19/2022 Pt will demonstrate appropriate understanding  and performance of initially prescribed HEP in order to facilitate improved independence with management of symptoms.  Baseline: HEP provided on eval 12/28/22: reports HEP going well Goal status: MET  2. Pt will score greater than or equal to 49 on FOTO in order to demonstrate improved perception of function due to symptoms.  Baseline: 34 12/28/22: 50 Goal status: ONGOING  LONG TERM GOALS: Target date: 02/08/2023 (updated 12/28/22) Pt will score 63 on FOTO in order to demonstrate improved perception of function due to symptoms. Baseline: 34 12/28/22: 50  Goal status: ONGOING  2.  Pt will demonstrate at least 140 degrees of active shoulder elevation in order to demonstrate improved tolerance to functional movement patterns such as overhead reaching and upper body dressing.  Baseline: see ROM chart above - PROM only on eval 12/28/22: see ROM chart above Goal status: ONGOING  3.  Pt will demonstrate at least 4+/5 shoulder flex/abduction MMT for improved symmetry of UE strength and improved tolerance to functional movements.  Baseline: deferred on eval given proximity to surgery 12/28/22: deferred given mobility deficits Goal status: ONGOING  4. Pt will report/demonstrate ability to perform upper body dressing with less than 2 point increase in pain on NPS in order to indicate improved tolerance/independence with ADLs.  Baseline: increased difficulty/modification required  12/28/22: able to perform w/o pain but does require modification/effort  Goal status:ONGOING  5. Pt will report at least 50% decrease in overall pain levels in past week in order to facilitate improved tolerance to basic ADLs/mobility.   Baseline: 0-5/10  12/28/22: 0-4/10  Goal status: ONGOING  6. Pt will demonstrate appropriate performance of final prescribed HEP in order to facilitate improved self-management of symptoms post-discharge.   Baseline: initial HEP prescribed  12/28/22: HEP going well per pt report  Goal  status: ONGOING  PLAN: UPDATED 12/28/22  PT FREQUENCY: 2x/week  PT DURATION: 6 weeks  PLANNED INTERVENTIONS: 97164- PT Re-evaluation, 97110-Therapeutic exercises, 97530- Therapeutic activity, 97112- Neuromuscular re-education, 97535- Self Care, 82956- Manual therapy, Patient/Family education, Balance training, Taping, Dry Needling, Joint mobilization, Spinal mobilization, Scar mobilization, Cryotherapy, and Moist heat  PLAN FOR NEXT SESSION: Review/update HEP PRN. Symptom modification strategies as indicated/appropriate. No extension/IR. Phase 3 (weeks 6-10) AROM supine>inclined    Ashley Murrain PT, DPT 12/28/2022 12:52 PM

## 2022-12-28 ENCOUNTER — Encounter: Payer: Self-pay | Admitting: Physical Therapy

## 2022-12-28 ENCOUNTER — Ambulatory Visit: Payer: Medicaid Other | Admitting: Physical Therapy

## 2022-12-28 DIAGNOSIS — M25611 Stiffness of right shoulder, not elsewhere classified: Secondary | ICD-10-CM | POA: Diagnosis not present

## 2022-12-28 DIAGNOSIS — M6281 Muscle weakness (generalized): Secondary | ICD-10-CM

## 2022-12-28 DIAGNOSIS — M25511 Pain in right shoulder: Secondary | ICD-10-CM | POA: Diagnosis not present

## 2022-12-31 ENCOUNTER — Other Ambulatory Visit: Payer: Self-pay

## 2022-12-31 ENCOUNTER — Other Ambulatory Visit (HOSPITAL_COMMUNITY): Payer: Self-pay

## 2022-12-31 ENCOUNTER — Encounter: Payer: Self-pay | Admitting: Nurse Practitioner

## 2022-12-31 ENCOUNTER — Ambulatory Visit (INDEPENDENT_AMBULATORY_CARE_PROVIDER_SITE_OTHER): Payer: Medicaid Other | Admitting: Nurse Practitioner

## 2022-12-31 ENCOUNTER — Ambulatory Visit (HOSPITAL_COMMUNITY)
Admission: RE | Admit: 2022-12-31 | Discharge: 2022-12-31 | Disposition: A | Discharge status: Home / Self Care | Payer: Medicaid Other | Source: Ambulatory Visit | Attending: Nurse Practitioner | Admitting: Nurse Practitioner

## 2022-12-31 VITALS — BP 160/95 | Wt 159.8 lb

## 2022-12-31 DIAGNOSIS — M25552 Pain in left hip: Secondary | ICD-10-CM | POA: Diagnosis not present

## 2022-12-31 DIAGNOSIS — Z131 Encounter for screening for diabetes mellitus: Secondary | ICD-10-CM

## 2022-12-31 DIAGNOSIS — R739 Hyperglycemia, unspecified: Secondary | ICD-10-CM | POA: Diagnosis not present

## 2022-12-31 DIAGNOSIS — Z13220 Encounter for screening for lipoid disorders: Secondary | ICD-10-CM | POA: Diagnosis not present

## 2022-12-31 DIAGNOSIS — D229 Melanocytic nevi, unspecified: Secondary | ICD-10-CM | POA: Diagnosis not present

## 2022-12-31 MED ORDER — MELOXICAM 15 MG PO TABS
15.0000 mg | ORAL_TABLET | Freq: Every day | ORAL | 0 refills | Status: DC | PRN
Start: 2022-12-31 — End: 2023-04-22

## 2022-12-31 NOTE — Patient Instructions (Addendum)
1. Atypical mole  - Ambulatory referral to Dermatology  2. Left hip pain  - DG Hip Unilat W OR W/O Pelvis 2-3 Views Left - meloxicam (MOBIC) 15 MG tablet; Take 1 tablet (15 mg total) by mouth daily as needed (pain/inflammation.).  Dispense: 30 tablet; Refill: 0  3. Lipid screening  - Lipid Panel  4. Diabetes mellitus screening  - CBC - Basic Metabolic Panel - Hemoglobin A1c   Follow up:  Follow up in 3 months

## 2022-12-31 NOTE — Progress Notes (Signed)
Specialty Pharmacy Refill Coordination Note  Kelly Grant is a 57 y.o. female contacted today regarding refills of specialty medication(s) Fingolimod Hcl   Patient requested [name] at [facility] at [place] date: 01/01/23   Medication will be filled on 01/01/23.

## 2022-12-31 NOTE — Progress Notes (Unsigned)
Subjective   Patient ID: Kelly Grant, female    DOB: [birth year], 57 y.o.   MRN: [id number]  Chief Complaint  Patient presents with   Hip Pain    Referring provider: Ivonne Andrew, NP  Kelly Grant is a 57 y.o. female with Past Medical History: No date: Abnormal Pap smear     Comment:  ASC-H No date: Arthritis No date: Chlamydia No date: History of chicken pox No date: HSV infection No date: Multiple sclerosis (HCC) No date: Neuromuscular disorder (HCC)     Comment:  MS No date: Yeast infection  HPI  Patient presents today for follow-up visit.  She does complain today of left hip pain that has been hurting for the past few months.  She states that as her more when going from sitting to standing.  We will check an x-ray today.  His blood pressure was elevated to the office.  After she was seated for few minutes blood pressure did come down to 157/77.  Patient is requesting a referral back to dermatology.  She states that she was seen by them in the past and had a mole removed on the side of her face and it has returned.  Denies f/c/s, n/v/d, hemoptysis, PND, leg swelling Denies chest pain or edema     Allergies  Allergen Reactions   Macrobid [Nitrofurantoin Macrocrystal] Hives    Immunization History  Administered Date(s) Administered   Influenza,inj,Quad PF,6+ Mos 11/05/2013, 10/08/2014, 11/04/2016, 11/23/2017   Influenza-Unspecified 11/23/2017   PFIZER(Purple Top)SARS-COV-2 Vaccination 04/07/2019, 04/28/2019, 01/03/2020   Tdap 03/04/2012, 08/05/2013    Tobacco History: Social History   Tobacco Use  Smoking Status Former   Current packs/day: 0.00   Types: Cigarettes   Quit date: 01/23/2000   Years since quitting: 22.9  Smokeless Tobacco Never   Counseling given: Not Answered   Outpatient Encounter Medications as of 12/31/2022  Medication Sig   aspirin EC 81 MG tablet Take 1 tablet (81 mg total) by mouth daily.   baclofen (LIORESAL) 10 MG tablet  Take 1 tablet (10 mg total) by mouth 3 (three) times daily. (Patient taking differently: Take 10 mg by mouth 2 (two) times daily as needed for muscle spasms.)   baclofen (LIORESAL) 10 MG tablet Take 1 tablet (10 mg total) by mouth 3 (three) times daily as needed for muscle spasm   Boswellia-Glucosamine-Vit D (OSTEO BI-FLEX ONE PER DAY PO) Take 2 tablets by mouth daily at 6 (six) AM.   calcium-vitamin D (OSCAL WITH D) 500-200 MG-UNIT tablet Take 2 tablets by mouth in the morning.   carboxymethylcellulose (REFRESH PLUS) 0.5 % SOLN Place 1 drop into both eyes 2 (two) times daily as needed (dry/irritated eyes.).   Fingolimod HCl 0.5 MG CAPS TAKE ONE CAPSULE BY MOUTH ONCE DAILY. MAY TAKE WITH OR WITHOUT FOOD. STORE AT ROOM TEMPERATURE.   Multiple Vitamin (MULTIVITAMIN) tablet Take 1 tablet by mouth daily.   omega-3 acid ethyl esters (LOVAZA) 1 g capsule Take 1 capsule (1 g total) by mouth daily.   ondansetron (ZOFRAN) 4 MG tablet Take 1 tablet (4 mg total) by mouth every 8 (eight) hours as needed for nausea or vomiting.   oxyCODONE (ROXICODONE) 5 MG immediate release tablet Take 1 tablet (5 mg total) by mouth every 4 (four) hours as needed for severe pain.   Probiotic Product (PROBIOTIC PO) Take 1 capsule by mouth daily at 6 (six) AM.   rosuvastatin (CRESTOR) 10 MG tablet TAKE 1 TABLET(10 MG) BY MOUTH  AT BEDTIME   sennosides-docusate sodium (SENOKOT-S) 8.6-50 MG tablet Take 2 tablets by mouth daily.   valACYclovir (VALTREX) 500 MG tablet TAKE 1 TABLET(500 MG) BY MOUTH DAILY   Wheat Dextrin (BENEFIBER DRINK MIX PO) Take 1 Dose by mouth in the morning and at bedtime.   [DISCONTINUED] meloxicam (MOBIC) 15 MG tablet Take 15 mg by mouth daily as needed (pain/inflammation.).   meloxicam (MOBIC) 15 MG tablet Take 1 tablet (15 mg total) by mouth daily as needed (pain/inflammation.).   No facility-administered encounter medications on file as of 12/31/2022.    Review of Systems  Review of Systems   Constitutional: Negative.   HENT: Negative.    Cardiovascular: Negative.   Gastrointestinal: Negative.   Musculoskeletal:        Left hip pain  Allergic/Immunologic: Negative.   Neurological: Negative.   Psychiatric/Behavioral: Negative.       Objective:   BP (!) 160/95 (BP Location: Left Arm, Patient Position: Sitting, Cuff Size: Normal)   Wt 159 lb 12.8 oz (72.5 kg)   BMI 27.43 kg/m   Wt Readings from Last 5 Encounters:  12/31/22 159 lb 12.8 oz (72.5 kg)  10/30/22 145 lb (65.8 kg)  10/19/22 145 lb (65.8 kg)  10/01/22 152 lb (68.9 kg)  09/12/22 149 lb 8 oz (67.8 kg)     Physical Exam Vitals and nursing note reviewed.  Constitutional:      General: She is not in acute distress.    Appearance: She is well-developed.  Cardiovascular:     Rate and Rhythm: Normal rate and regular rhythm.  Pulmonary:     Effort: Pulmonary effort is normal.     Breath sounds: Normal breath sounds.  Musculoskeletal:     Left hip: Tenderness present. Normal range of motion.  Neurological:     Mental Status: She is alert and oriented to person, place, and time.       Assessment & Plan:   Atypical mole -     Ambulatory referral to Dermatology  Left hip pain -     DG HIP UNILAT W OR W/O PELVIS 2-3 VIEWS LEFT -     Meloxicam; Take 1 tablet (15 mg total) by mouth daily as needed (pain/inflammation.).  Dispense: 30 tablet; Refill: 0  Lipid screening -     Lipid panel  Diabetes mellitus screening -     CBC -     Basic metabolic panel -     Hemoglobin A1c     Return in about 6 months (around 07/01/2023).      Ivonne Andrew, NP 01/01/2023

## 2023-01-01 ENCOUNTER — Other Ambulatory Visit (HOSPITAL_COMMUNITY): Payer: Self-pay

## 2023-01-01 ENCOUNTER — Encounter: Payer: Self-pay | Admitting: Nurse Practitioner

## 2023-01-01 ENCOUNTER — Encounter: Payer: Self-pay | Admitting: Physical Therapy

## 2023-01-01 ENCOUNTER — Ambulatory Visit: Payer: Medicaid Other | Admitting: Physical Therapy

## 2023-01-01 ENCOUNTER — Other Ambulatory Visit: Payer: Self-pay | Admitting: Nurse Practitioner

## 2023-01-01 DIAGNOSIS — M6281 Muscle weakness (generalized): Secondary | ICD-10-CM | POA: Diagnosis not present

## 2023-01-01 DIAGNOSIS — M25552 Pain in left hip: Secondary | ICD-10-CM

## 2023-01-01 DIAGNOSIS — M25511 Pain in right shoulder: Secondary | ICD-10-CM

## 2023-01-01 DIAGNOSIS — M25611 Stiffness of right shoulder, not elsewhere classified: Secondary | ICD-10-CM | POA: Diagnosis not present

## 2023-01-01 LAB — BASIC METABOLIC PANEL
BUN/Creatinine Ratio: 21 (ref 9–23)
BUN: 16 mg/dL (ref 6–24)
CO2: 24 mmol/L (ref 20–29)
Calcium: 9.8 mg/dL (ref 8.7–10.2)
Chloride: 106 mmol/L (ref 96–106)
Creatinine, Ser: 0.75 mg/dL (ref 0.57–1.00)
Glucose: 94 mg/dL (ref 70–99)
Potassium: 4.1 mmol/L (ref 3.5–5.2)
Sodium: 144 mmol/L (ref 134–144)
eGFR: 92 mL/min/{1.73_m2} (ref >59)

## 2023-01-01 LAB — CBC
Hematocrit: 40.3 % (ref 34.0–46.6)
Hemoglobin: 13 g/dL (ref 11.1–15.9)
MCH: 31.9 pg (ref 26.6–33.0)
MCHC: 32.3 g/dL (ref 31.5–35.7)
MCV: 99 fL — ABNORMAL HIGH (ref 79–97)
Platelets: 270 10*3/uL (ref 150–450)
RBC: 4.08 x10E6/uL (ref 3.77–5.28)
RDW: 13.2 % (ref 11.7–15.4)
WBC: 3.8 10*3/uL (ref 3.4–10.8)

## 2023-01-01 LAB — LIPID PANEL
Chol/HDL Ratio: 3.3 {ratio} (ref 0.0–4.4)
Cholesterol, Total: 204 mg/dL — ABNORMAL HIGH (ref 100–199)
HDL: 62 mg/dL (ref >39)
LDL Chol Calc (NIH): 117 mg/dL — ABNORMAL HIGH (ref 0–99)
Triglycerides: 141 mg/dL (ref 0–149)
VLDL Cholesterol Cal: 25 mg/dL (ref 5–40)

## 2023-01-01 LAB — HEMOGLOBIN A1C
Est. average glucose Bld gHb Est-mCnc: 120 mg/dL
Hgb A1c MFr Bld: 5.8 % — ABNORMAL HIGH (ref 4.8–5.6)

## 2023-01-01 NOTE — Therapy (Signed)
OUTPATIENT PHYSICAL TREATMENT NOTE   Patient Name: Kelly Grant MRN: [id number] DOB:Oct 10, 1965, 57 y.o., female Today's Date: 01/01/2023     END OF SESSION:  PT End of Session - 01/01/23 1102     Visit Number 11    Number of Visits 22    Date for PT Re-Evaluation 02/08/23    Authorization Type [employer]    Authorization Time Period 15 visits approved 11/26/22-02/24/23    Authorization - Visit Number 10    Authorization - Number of Visits 15    PT Start Time 1104    PT Stop Time 1146    PT Time Calculation (min) 42 min    Activity Tolerance Patient tolerated treatment well;No increased pain    Behavior During Therapy WFL for tasks assessed/performed               Past Medical History:  Diagnosis Date   Abnormal Pap smear    ASC-H   Arthritis    Chlamydia    History of chicken pox    HSV infection    Multiple sclerosis (HCC)    Neuromuscular disorder (HCC)    MS   Yeast infection    Past Surgical History:  Procedure Laterality Date   CARPAL TUNNEL RELEASE Left    in addition to wrist fracture surgery   CESAREAN SECTION     x2   COLONOSCOPY  2016   DJ-MAC-movi(exc)-TA   INCISION AND DRAINAGE ABSCESS Left    left buttock   POLYPECTOMY  2016   TA   REVERSE SHOULDER ARTHROPLASTY Right 10/30/2022   Procedure: REVERSE SHOULDER ARTHROPLASTY;  Surgeon: Teryl Lucy, MD;  Location: [facility];  Service: Orthopedics;  Laterality: Right;   TUBAL LIGATION     Patient Active Problem List   Diagnosis Date Noted   Diabetes mellitus screening 10/01/2022   ASCUS with positive high risk HPV cervical 05/07/2022   Hyperlipidemia 09/29/2021   Encounter for therapeutic drug monitoring 12/13/2014   Multiple sclerosis (HCC) 05/08/2012   HSV infection 06/22/2011    PCP: Ivonne Andrew, NP  REFERRING PROVIDER: Teryl Lucy, MD  REFERRING DIAG: S/P Right Reverse Total Shoulder Arthroplasty DOS 10/30/2022  THERAPY DIAG:  Right shoulder pain, unspecified  chronicity  Muscle weakness (generalized)  Stiffness of right shoulder, not elsewhere classified  Rationale for Evaluation and Treatment: Rehabilitation  ONSET DATE: s/p rTSA 10/30/22  SUBJECTIVE:                                                                                                                                                                                     Per eval - Pt reports ongoing shoulder pain since August  2024 which led to rTSA on 10/30/22. Since surgery has been using sling most of the time although she states at recent follow up with surgeon she was cleared to be out of it while sitting. Has been doing elbow/hand exercises. States she is able to do majority of self care tasks without assist but does have increased difficulty and is avoiding liting/heavier tasks as expected post op.  Hand dominance: Right  SUBJECTIVE STATEMENT: 01/01/2023 No pain at present, was a little sore after last session but cleared up after a short period. No new issues/updates   PERTINENT HISTORY: MS, rSTA 10/30/22  PAIN:  Are you having pain: no pain Worst: 4/10 Agg: movement  Per eval -  Location/description: lateral/superior shoulder R  Best-worst over past week: 0-5/10, calms quickly   - aggravating factors: general activity, difficulty describing otherwise - Easing factors: rest, sling, medication  PRECAUTIONS: R shoulder reverse TSA 8 weeks: 12/25/22 10 weeks: 01/08/23 12 weeks: 01/22/23  WEIGHT BEARING RESTRICTIONS: Yes assumed NWB on surgical limb  FALLS:  Has patient fallen in last 6 months? No  LIVING ENVIRONMENT: Third floor apartment with three flights of stairs 22 year old son lives with her  OCCUPATION: Working prior to surgery - operator/lead on packing line at [employer]; lots of time up on feet, has to lift fairly heavy items at times   PLOF: Independent  PATIENT GOALS:  be able to use shoulder more again  NEXT MD VISIT: December 16th  OBJECTIVE:   Note: Objective measures were completed at Evaluation unless otherwise noted.  DIAGNOSTIC FINDINGS:  S/p rTSA 10/30/22  PATIENT SURVEYS:  FOTO 34 current, 63 predicted 12/11/22 FOTO 50 12/28/22: FOTO 50  COGNITION: Overall cognitive status: Within functional limits for tasks assessed     SENSATION: No sensory complaints, not formally tested  POSTURE: Guarded posture RUE as expected, mild forward head and increased thoracic kyphosis  UPPER EXTREMITY ROM:  A/PROM Right eval Left eval Right 11/27/22 Right 11/30/22 Right 12/28/22  Shoulder flexion P: ~60 deg limited by muscle guarding   P: ~80 deg P: 96 deg painless with towel slides at table A: 88 deg (more scaption due to ER deficits)  Shoulder abduction P: ~70 deg  P: ~80 deg  P: 98 deg without pain  Shoulder internal rotation NT      Shoulder external rotation NT    P: neutral without pain, limited by stiffness  Elbow flexion       Elbow extension       Wrist flexion       Wrist extension        (Blank rows = not tested) (Key: WFL = within functional limits not formally assessed, * = concordant pain, s = stiffness/stretching sensation, NT = not tested)  Comments: pt denies pain with ROM  UPPER EXTREMITY MMT:  MMT Right eval Left eval  Shoulder flexion    Shoulder extension    Shoulder abduction    Shoulder extension    Shoulder internal rotation    Shoulder external rotation    Elbow flexion    Elbow extension    Grip strength    (Blank rows = not tested)  (Key: WFL = within functional limits not formally assessed, * = concordant pain, s = stiffness/stretching sensation, NT = not tested)  Comments: deferred on eval given acuity of surgery  SHOULDER SPECIAL TESTS: Deferred given acuity of surgery  JOINT MOBILITY TESTING:  Deferred given acuity of surgery  PALPATION:  Deferred - visual inspection  of incision covered in steristrips, no apparent erythema or swelling, grossly WNL   TODAY'S TREATMENT:   [facility]  Adult PT Treatment:                                                DATE: 01/01/23 Therapeutic Exercise: Supine chest press + overhead pullover x15 w/ dowel  Supine R GH flexion AROM 2x8  Seated GH flexion AAROM w/ dowel x10 cues to reduce scapular compensations Yellow band standing row 3x10 initially requiring tactile cues to avoid shoulder extension, weaning with repetitions Finger ladder flexion x5 cues to reduce scapular compensations Shoulder ER PROM at column w/ towel support to avoid extension x5  Manual Therapy: R GH PROM flex/abd/ER (emphasis on ER); supine to pt tolerance    [facility] Adult PT Treatment:                                                DATE: 12/28/22 Therapeutic Exercise: Supine punches AROM 2x8 cues for maintaining alignment Supine shoulder flexion AROM R GH to tolerated ROM, 3x5 cues for alignment Sidelying R GH ER to neutral w. Towel at elbow for alignment x10 Dowel ER AAROM 2x15 cues for comfortable ROM, seated Finger ladder x8 R GH emphasis on reducing scapular compensations, elbow alignment (PR 21)  Manual Therapy: Supine; PROM R GH flex/abduction/ER to pt tolerance - continued emphasis on ER  Therapeutic Activity: FOTO + education MSK assessment + education Education/discussion re: progress with PT, symptom behavior as it affects activity tolerance, PT goals/POC                                                                           PATIENT EDUCATION: Education details: rationale for interventions, HEP Person educated: Patient Education method: Explanation, Demonstration, Tactile cues, Verbal cues Education comprehension: verbalized understanding, returned demonstration, verbal cues required, tactile cues required  HOME EXERCISE PROGRAM: Access Code: [email or web address]   ASSESSMENT:  CLINICAL IMPRESSION: 01/01/2023 Pt arrives w/o pain, continues to endorse slow/steady progress. Today continuing to work on [facility] mobility, PROM/AAROM and active ROM in gravity  reduced positions. Also introducing light periscapular strengthening w/ care taken to avoid shoulder extension, pt tolerates well without any pain. No adverse events, tolerates well without increase in pain. [facility] ER mobility remains biggest limiting factor. Recommend continuing along current POC in order to address relevant deficits and improve functional tolerance. Pt departs today's session in no acute distress, all voiced questions/concerns addressed appropriately from PT perspective.     Per eval - Patient is a 57 y.o. woman who was seen today for physical therapy evaluation and treatment for R shoulder pain s/p rTSA 10/30/22. Pt endorses difficulty with daily activities and self care as expected postoperatively. Exam limited to PROM today given proximity to surgery, measurements as above. Initiation of HEP with emphasis on appropriate mechanics within pt precautions, does well with avoiding shoulder extension with scapular retraction. Tolerates session quite well overall. No adverse events,  pt reports no pain on departure. Recommend skilled PT to address aforementioned deficits with aim of improving functional tolerance and reducing pain with typical activities. Pt departs today's session in no acute distress, all voiced concerns/questions addressed appropriately from PT perspective.      OBJECTIVE IMPAIRMENTS: decreased activity tolerance, decreased endurance, decreased mobility, decreased ROM, decreased strength, impaired UE functional use, postural dysfunction, and pain.   ACTIVITY LIMITATIONS: carrying, lifting, sleeping, bathing, reach over head, and hygiene/grooming  PARTICIPATION LIMITATIONS: meal prep, cleaning, laundry, driving, community activity, and occupation  PERSONAL FACTORS: Time since onset of injury/illness/exacerbation and 1 comorbidity: MS  are also affecting patient's functional outcome.   REHAB POTENTIAL: Good  CLINICAL DECISION MAKING: Stable/uncomplicated  EVALUATION  COMPLEXITY: Low   GOALS: Goals reviewed with patient? Yes  SHORT TERM GOALS: Target date: 12/19/2022 Pt will demonstrate appropriate understanding and performance of initially prescribed HEP in order to facilitate improved independence with management of symptoms.  Baseline: HEP provided on eval 12/28/22: reports HEP going well Goal status: MET  2. Pt will score greater than or equal to 49 on FOTO in order to demonstrate improved perception of function due to symptoms.  Baseline: 34 12/28/22: 50 Goal status: ONGOING  LONG TERM GOALS: Target date: 02/08/2023 (updated 12/28/22) Pt will score 63 on FOTO in order to demonstrate improved perception of function due to symptoms. Baseline: 34 12/28/22: 50  Goal status: ONGOING  2.  Pt will demonstrate at least 140 degrees of active shoulder elevation in order to demonstrate improved tolerance to functional movement patterns such as overhead reaching and upper body dressing.  Baseline: see ROM chart above - PROM only on eval 12/28/22: see ROM chart above Goal status: ONGOING  3.  Pt will demonstrate at least 4+/5 shoulder flex/abduction MMT for improved symmetry of UE strength and improved tolerance to functional movements.  Baseline: deferred on eval given proximity to surgery 12/28/22: deferred given mobility deficits Goal status: ONGOING  4. Pt will report/demonstrate ability to perform upper body dressing with less than 2 point increase in pain on NPS in order to indicate improved tolerance/independence with ADLs.  Baseline: increased difficulty/modification required  12/28/22: able to perform w/o pain but does require modification/effort  Goal status:ONGOING  5. Pt will report at least 50% decrease in overall pain levels in past week in order to facilitate improved tolerance to basic ADLs/mobility.   Baseline: 0-5/10  12/28/22: 0-4/10  Goal status: ONGOING  6. Pt will demonstrate appropriate performance of final prescribed HEP in order  to facilitate improved self-management of symptoms post-discharge.   Baseline: initial HEP prescribed  12/28/22: HEP going well per pt report  Goal status: ONGOING  PLAN: UPDATED 12/28/22  PT FREQUENCY: 2x/week  PT DURATION: 6 weeks  PLANNED INTERVENTIONS: 97164- PT Re-evaluation, 97110-Therapeutic exercises, 97530- Therapeutic activity, 97112- Neuromuscular re-education, 97535- Self Care, 16109- Manual therapy, Patient/Family education, Balance training, Taping, Dry Needling, Joint mobilization, Spinal mobilization, Scar mobilization, Cryotherapy, and Moist heat  PLAN FOR NEXT SESSION: Review/update HEP PRN. Symptom modification strategies as indicated/appropriate. No extension/IR. Phase 3 (weeks 6-10) AROM supine>inclined    Ashley Murrain PT, DPT 01/01/2023 12:38 PM

## 2023-01-02 NOTE — Progress Notes (Signed)
My chart message was sent to pt after confirming last activity date.

## 2023-01-03 ENCOUNTER — Ambulatory Visit: Payer: Medicaid Other

## 2023-01-03 DIAGNOSIS — M25511 Pain in right shoulder: Secondary | ICD-10-CM

## 2023-01-03 DIAGNOSIS — M25611 Stiffness of right shoulder, not elsewhere classified: Secondary | ICD-10-CM | POA: Diagnosis not present

## 2023-01-03 DIAGNOSIS — M6281 Muscle weakness (generalized): Secondary | ICD-10-CM

## 2023-01-03 NOTE — Therapy (Signed)
OUTPATIENT PHYSICAL TREATMENT NOTE   Patient Name: Kelly Grant MRN: [id number] DOB:November 18, 1965, 57 y.o., female Today's Date: 01/03/2023     END OF SESSION:  PT End of Session - 01/03/23 0916     Visit Number 12    Number of Visits 22    Date for PT Re-Evaluation 02/08/23    Authorization Type [employer]    Authorization Time Period 15 visits approved 11/26/22-02/24/23    Authorization - Visit Number 11    Authorization - Number of Visits 15    PT Start Time 0916    PT Stop Time 0956    PT Time Calculation (min) 40 min    Activity Tolerance Patient tolerated treatment well;No increased pain    Behavior During Therapy WFL for tasks assessed/performed                Past Medical History:  Diagnosis Date   Abnormal Pap smear    ASC-H   Arthritis    Chlamydia    History of chicken pox    HSV infection    Multiple sclerosis (HCC)    Neuromuscular disorder (HCC)    MS   Yeast infection    Past Surgical History:  Procedure Laterality Date   CARPAL TUNNEL RELEASE Left    in addition to wrist fracture surgery   CESAREAN SECTION     x2   COLONOSCOPY  2016   DJ-MAC-movi(exc)-TA   INCISION AND DRAINAGE ABSCESS Left    left buttock   POLYPECTOMY  2016   TA   REVERSE SHOULDER ARTHROPLASTY Right 10/30/2022   Procedure: REVERSE SHOULDER ARTHROPLASTY;  Surgeon: Teryl Lucy, MD;  Location: [facility];  Service: Orthopedics;  Laterality: Right;   TUBAL LIGATION     Patient Active Problem List   Diagnosis Date Noted   Diabetes mellitus screening 10/01/2022   ASCUS with positive high risk HPV cervical 05/07/2022   Hyperlipidemia 09/29/2021   Encounter for therapeutic drug monitoring 12/13/2014   Multiple sclerosis (HCC) 05/08/2012   HSV infection 06/22/2011    PCP: Ivonne Andrew, NP  REFERRING PROVIDER: Teryl Lucy, MD  REFERRING DIAG: S/P Right Reverse Total Shoulder Arthroplasty DOS 10/30/2022  THERAPY DIAG:  Right shoulder pain, unspecified  chronicity  Muscle weakness (generalized)  Stiffness of right shoulder, not elsewhere classified  Rationale for Evaluation and Treatment: Rehabilitation  ONSET DATE: s/p rTSA 10/30/22  SUBJECTIVE:                                                                                                                                                                                     Per eval - Pt reports ongoing shoulder pain since  August 2024 which led to rTSA on 10/30/22. Since surgery has been using sling most of the time although she states at recent follow up with surgeon she was cleared to be out of it while sitting. Has been doing elbow/hand exercises. States she is able to do majority of self care tasks without assist but does have increased difficulty and is avoiding liting/heavier tasks as expected post op.  Hand dominance: Right  SUBJECTIVE STATEMENT: Patient reports no soreness after last session, nothing new to report.   PERTINENT HISTORY: MS, rSTA 10/30/22  PAIN:  Are you having pain: no pain Worst: 4/10 Agg: movement  Per eval -  Location/description: lateral/superior shoulder R  Best-worst over past week: 0-5/10, calms quickly   - aggravating factors: general activity, difficulty describing otherwise - Easing factors: rest, sling, medication  PRECAUTIONS: R shoulder reverse TSA 8 weeks: 12/25/22 10 weeks: 01/08/23 12 weeks: 01/22/23  WEIGHT BEARING RESTRICTIONS: Yes assumed NWB on surgical limb  FALLS:  Has patient fallen in last 6 months? No  LIVING ENVIRONMENT: Third floor apartment with three flights of stairs 27 year old son lives with her  OCCUPATION: Working prior to surgery - operator/lead on packing line at [employer]; lots of time up on feet, has to lift fairly heavy items at times   PLOF: Independent  PATIENT GOALS:  be able to use shoulder more again  NEXT MD VISIT: December 16th  OBJECTIVE:  Note: Objective measures were completed at Evaluation  unless otherwise noted.  DIAGNOSTIC FINDINGS:  S/p rTSA 10/30/22  PATIENT SURVEYS:  FOTO 34 current, 63 predicted 12/11/22 FOTO 50 12/28/22: FOTO 50  COGNITION: Overall cognitive status: Within functional limits for tasks assessed     SENSATION: No sensory complaints, not formally tested  POSTURE: Guarded posture RUE as expected, mild forward head and increased thoracic kyphosis  UPPER EXTREMITY ROM:  A/PROM Right eval Left eval Right 11/27/22 Right 11/30/22 Right 12/28/22  Shoulder flexion P: ~60 deg limited by muscle guarding   P: ~80 deg P: 96 deg painless with towel slides at table A: 88 deg (more scaption due to ER deficits)  Shoulder abduction P: ~70 deg  P: ~80 deg  P: 98 deg without pain  Shoulder internal rotation NT      Shoulder external rotation NT    P: neutral without pain, limited by stiffness  Elbow flexion       Elbow extension       Wrist flexion       Wrist extension        (Blank rows = not tested) (Key: WFL = within functional limits not formally assessed, * = concordant pain, s = stiffness/stretching sensation, NT = not tested)  Comments: pt denies pain with ROM  UPPER EXTREMITY MMT:  MMT Right eval Left eval  Shoulder flexion    Shoulder extension    Shoulder abduction    Shoulder extension    Shoulder internal rotation    Shoulder external rotation    Elbow flexion    Elbow extension    Grip strength    (Blank rows = not tested)  (Key: WFL = within functional limits not formally assessed, * = concordant pain, s = stiffness/stretching sensation, NT = not tested)  Comments: deferred on eval given acuity of surgery  SHOULDER SPECIAL TESTS: Deferred given acuity of surgery  JOINT MOBILITY TESTING:  Deferred given acuity of surgery  PALPATION:  Deferred - visual inspection of incision covered in steristrips, no apparent erythema or swelling,  grossly WNL   TODAY'S TREATMENT:   [facility] Adult PT Treatment:                                                 DATE: 01/03/23 Therapeutic Exercise: Supine chest press + overhead pullover x15 w/ dowel  Supine R GH flexion AROM x10 Seated GH flexion AAROM w/ dowel x10 cues to reduce scapular compensations Yellow band standing row 3x10 initially requiring tactile cues to avoid shoulder extension, weaning with repetitions Finger ladder flexion x5 cues to reduce scapular compensations Shoulder ER PROM at column w/ towel support to avoid extension x5 Manual Therapy: R GH PROM flex/abd/ER (emphasis on ER); supine to pt tolerance   [facility] Adult PT Treatment:                                                DATE: 01/01/23 Therapeutic Exercise: Supine chest press + overhead pullover x15 w/ dowel  Supine R GH flexion AROM 2x8  Seated GH flexion AAROM w/ dowel x10 cues to reduce scapular compensations Yellow band standing row 3x10 initially requiring tactile cues to avoid shoulder extension, weaning with repetitions Finger ladder flexion x5 cues to reduce scapular compensations Shoulder ER PROM at column w/ towel support to avoid extension x5  Manual Therapy: R GH PROM flex/abd/ER (emphasis on ER); supine to pt tolerance   [facility] Adult PT Treatment:                                                DATE: 12/28/22 Therapeutic Exercise: Supine punches AROM 2x8 cues for maintaining alignment Supine shoulder flexion AROM R GH to tolerated ROM, 3x5 cues for alignment Sidelying R GH ER to neutral w. Towel at elbow for alignment x10 Dowel ER AAROM 2x15 cues for comfortable ROM, seated Finger ladder x8 R GH emphasis on reducing scapular compensations, elbow alignment (PR 21)  Manual Therapy: Supine; PROM R GH flex/abduction/ER to pt tolerance - continued emphasis on ER  Therapeutic Activity: FOTO + education MSK assessment + education Education/discussion re: progress with PT, symptom behavior as it affects activity tolerance, PT goals/POC                                                                            PATIENT EDUCATION: Education details: rationale for interventions, HEP Person educated: Patient Education method: Explanation, Demonstration, Tactile cues, Verbal cues Education comprehension: verbalized understanding, returned demonstration, verbal cues required, tactile cues required  HOME EXERCISE PROGRAM: Access Code: [email or web address]   ASSESSMENT:  CLINICAL IMPRESSION: Patient presents to PT reporting no current pain and no new issues since previous session. Session today continued to focus on PROM/AAROM and gradually moving to AROM. She requires verbal and tactile cues to decrease scapular compensation with shoulder flexion, but she does state that she has noticed  she can use her UE easier for certain ADLs. Patient was able to tolerate all prescribed exercises with no adverse effects. Patient continues to benefit from skilled PT services and should be progressed as able to improve functional independence.   Per eval - Patient is a 57 y.o. woman who was seen today for physical therapy evaluation and treatment for R shoulder pain s/p rTSA 10/30/22. Pt endorses difficulty with daily activities and self care as expected postoperatively. Exam limited to PROM today given proximity to surgery, measurements as above. Initiation of HEP with emphasis on appropriate mechanics within pt precautions, does well with avoiding shoulder extension with scapular retraction. Tolerates session quite well overall. No adverse events, pt reports no pain on departure. Recommend skilled PT to address aforementioned deficits with aim of improving functional tolerance and reducing pain with typical activities. Pt departs today's session in no acute distress, all voiced concerns/questions addressed appropriately from PT perspective.      OBJECTIVE IMPAIRMENTS: decreased activity tolerance, decreased endurance, decreased mobility, decreased ROM, decreased strength, impaired UE functional use, postural dysfunction, and pain.    ACTIVITY LIMITATIONS: carrying, lifting, sleeping, bathing, reach over head, and hygiene/grooming  PARTICIPATION LIMITATIONS: meal prep, cleaning, laundry, driving, community activity, and occupation  PERSONAL FACTORS: Time since onset of injury/illness/exacerbation and 1 comorbidity: MS  are also affecting patient's functional outcome.   REHAB POTENTIAL: Good  CLINICAL DECISION MAKING: Stable/uncomplicated  EVALUATION COMPLEXITY: Low   GOALS: Goals reviewed with patient? Yes  SHORT TERM GOALS: Target date: 12/19/2022 Pt will demonstrate appropriate understanding and performance of initially prescribed HEP in order to facilitate improved independence with management of symptoms.  Baseline: HEP provided on eval 12/28/22: reports HEP going well Goal status: MET  2. Pt will score greater than or equal to 49 on FOTO in order to demonstrate improved perception of function due to symptoms.  Baseline: 34 12/28/22: 50 Goal status: ONGOING  LONG TERM GOALS: Target date: 02/08/2023 (updated 12/28/22) Pt will score 63 on FOTO in order to demonstrate improved perception of function due to symptoms. Baseline: 34 12/28/22: 50  Goal status: ONGOING  2.  Pt will demonstrate at least 140 degrees of active shoulder elevation in order to demonstrate improved tolerance to functional movement patterns such as overhead reaching and upper body dressing.  Baseline: see ROM chart above - PROM only on eval 12/28/22: see ROM chart above Goal status: ONGOING  3.  Pt will demonstrate at least 4+/5 shoulder flex/abduction MMT for improved symmetry of UE strength and improved tolerance to functional movements.  Baseline: deferred on eval given proximity to surgery 12/28/22: deferred given mobility deficits Goal status: ONGOING  4. Pt will report/demonstrate ability to perform upper body dressing with less than 2 point increase in pain on NPS in order to indicate improved tolerance/independence with  ADLs.  Baseline: increased difficulty/modification required  12/28/22: able to perform w/o pain but does require modification/effort  Goal status:ONGOING  5. Pt will report at least 50% decrease in overall pain levels in past week in order to facilitate improved tolerance to basic ADLs/mobility.   Baseline: 0-5/10  12/28/22: 0-4/10  Goal status: ONGOING  6. Pt will demonstrate appropriate performance of final prescribed HEP in order to facilitate improved self-management of symptoms post-discharge.   Baseline: initial HEP prescribed  12/28/22: HEP going well per pt report  Goal status: ONGOING  PLAN: UPDATED 12/28/22  PT FREQUENCY: 2x/week  PT DURATION: 6 weeks  PLANNED INTERVENTIONS: 97164- PT Re-evaluation, 97110-Therapeutic exercises, 97530- Therapeutic activity,  16109- Neuromuscular re-education, [phone]- Self Care, 09811- Manual therapy, Patient/Family education, Balance training, Taping, Dry Needling, Joint mobilization, Spinal mobilization, Scar mobilization, Cryotherapy, and Moist heat  PLAN FOR NEXT SESSION: Review/update HEP PRN. Symptom modification strategies as indicated/appropriate. No extension/IR. Phase 3 (weeks 6-10) AROM supine>inclined    Berta Minor PTA 01/03/2023 9:56 AM

## 2023-01-07 NOTE — Therapy (Signed)
OUTPATIENT PHYSICAL TREATMENT NOTE   Patient Name: Kelly Grant MRN: [id number] DOB:1965-07-11, 57 y.o., female Today's Date: 01/08/2023     END OF SESSION:  PT End of Session - 01/08/23 1014     Visit Number 13    Number of Visits 22    Date for PT Re-Evaluation 02/08/23    Authorization Type [employer]    Authorization Time Period 15 visits approved 11/26/22-02/24/23    Authorization - Visit Number 12    Authorization - Number of Visits 15    PT Start Time 1015    PT Stop Time 1057    PT Time Calculation (min) 42 min    Activity Tolerance Patient tolerated treatment well;No increased pain                 Past Medical History:  Diagnosis Date   Abnormal Pap smear    ASC-H   Arthritis    Chlamydia    History of chicken pox    HSV infection    Multiple sclerosis (HCC)    Neuromuscular disorder (HCC)    MS   Yeast infection    Past Surgical History:  Procedure Laterality Date   CARPAL TUNNEL RELEASE Left    in addition to wrist fracture surgery   CESAREAN SECTION     x2   COLONOSCOPY  2016   DJ-MAC-movi(exc)-TA   INCISION AND DRAINAGE ABSCESS Left    left buttock   POLYPECTOMY  2016   TA   REVERSE SHOULDER ARTHROPLASTY Right 10/30/2022   Procedure: REVERSE SHOULDER ARTHROPLASTY;  Surgeon: Teryl Lucy, MD;  Location: [facility];  Service: Orthopedics;  Laterality: Right;   TUBAL LIGATION     Patient Active Problem List   Diagnosis Date Noted   Diabetes mellitus screening 10/01/2022   ASCUS with positive high risk HPV cervical 05/07/2022   Hyperlipidemia 09/29/2021   Encounter for therapeutic drug monitoring 12/13/2014   Multiple sclerosis (HCC) 05/08/2012   HSV infection 06/22/2011    PCP: Ivonne Andrew, NP  REFERRING PROVIDER: Teryl Lucy, MD  REFERRING DIAG: S/P Right Reverse Total Shoulder Arthroplasty DOS 10/30/2022  THERAPY DIAG:  Right shoulder pain, unspecified chronicity  Muscle weakness (generalized)  Stiffness of right shoulder,  not elsewhere classified  Rationale for Evaluation and Treatment: Rehabilitation  ONSET DATE: s/p rTSA 10/30/22  SUBJECTIVE:                                                                                                                                                                                     Per eval - Pt reports ongoing shoulder pain since August 2024 which led to rTSA on 10/30/22. Since  surgery has been using sling most of the time although she states at recent follow up with surgeon she was cleared to be out of it while sitting. Has been doing elbow/hand exercises. States she is able to do majority of self care tasks without assist but does have increased difficulty and is avoiding liting/heavier tasks as expected post op.  Hand dominance: Right  SUBJECTIVE STATEMENT: Saw surgeon yesterday, states they didn't have any concerns. Follows up in a month. Mild pain earlier this morning, since resolved. Overall no new updates.    PERTINENT HISTORY: MS, rSTA 10/30/22  PAIN:  Are you having pain: no pain Worst: 4/10 Agg: movement  Per eval -  Location/description: lateral/superior shoulder R  Best-worst over past week: 0-5/10, calms quickly   - aggravating factors: general activity, difficulty describing otherwise - Easing factors: rest, sling, medication  PRECAUTIONS: R shoulder reverse TSA 8 weeks: 12/25/22 10 weeks: 01/08/23 12 weeks: 01/22/23  WEIGHT BEARING RESTRICTIONS: Yes assumed NWB on surgical limb  FALLS:  Has patient fallen in last 6 months? No  LIVING ENVIRONMENT: Third floor apartment with three flights of stairs 85 year old son lives with her  OCCUPATION: Working prior to surgery - operator/lead on packing line at [employer]; lots of time up on feet, has to lift fairly heavy items at times   PLOF: Independent  PATIENT GOALS:  be able to use shoulder more again  NEXT MD VISIT: December 16th  OBJECTIVE:  Note: Objective measures were completed at  Evaluation unless otherwise noted.  DIAGNOSTIC FINDINGS:  S/p rTSA 10/30/22  PATIENT SURVEYS:  FOTO 34 current, 63 predicted 12/11/22 FOTO 50 12/28/22: FOTO 50  COGNITION: Overall cognitive status: Within functional limits for tasks assessed     SENSATION: No sensory complaints, not formally tested  POSTURE: Guarded posture RUE as expected, mild forward head and increased thoracic kyphosis  UPPER EXTREMITY ROM:  A/PROM Right eval Left eval Right 11/27/22 Right 11/30/22 Right 12/28/22  Shoulder flexion P: ~60 deg limited by muscle guarding   P: ~80 deg P: 96 deg painless with towel slides at table A: 88 deg (more scaption due to ER deficits)  Shoulder abduction P: ~70 deg  P: ~80 deg  P: 98 deg without pain  Shoulder internal rotation NT      Shoulder external rotation NT    P: neutral without pain, limited by stiffness  Elbow flexion       Elbow extension       Wrist flexion       Wrist extension        (Blank rows = not tested) (Key: WFL = within functional limits not formally assessed, * = concordant pain, s = stiffness/stretching sensation, NT = not tested)  Comments: pt denies pain with ROM  UPPER EXTREMITY MMT:  MMT Right eval Left eval  Shoulder flexion    Shoulder extension    Shoulder abduction    Shoulder extension    Shoulder internal rotation    Shoulder external rotation    Elbow flexion    Elbow extension    Grip strength    (Blank rows = not tested)  (Key: WFL = within functional limits not formally assessed, * = concordant pain, s = stiffness/stretching sensation, NT = not tested)  Comments: deferred on eval given acuity of surgery  SHOULDER SPECIAL TESTS: Deferred given acuity of surgery  JOINT MOBILITY TESTING:  Deferred given acuity of surgery  PALPATION:  Deferred - visual inspection of incision covered in  steristrips, no apparent erythema or swelling, grossly WNL   TODAY'S TREATMENT:   [facility] Adult PT Treatment:                                                 DATE: 01/08/23 Therapeutic Exercise: Supine shoulder flexion AAROM w/ dowel x10 Super shoulder flexion AROM x8 cues for comfortable ROM Supine punches 2x8 cues for comfortable ROM Seated dowel ER AAROM w/ towel tucked at elbow for support; 2x10, cues for appropriate setup/ROM Standing red band row 2x10 tactile cues to avoid extension, weaning w/ repetition, cues for posture Standing shoulder flexion finger ladder x15 cues for reduced scapular compensations Shoulder abduction isometric x8 w 3 sec hold L GH   Manual Therapy: PROM R shoulder flex/abd/scaption/ER; increased time spent w/ ER, eventually able to get to neutral. Subsequently working more on [facility] (2-Rh) elevation with improved alignment    [facility] Adult PT Treatment:                                                DATE: 01/03/23 Therapeutic Exercise: Supine chest press + overhead pullover x15 w/ dowel  Supine R GH flexion AROM x10 Seated GH flexion AAROM w/ dowel x10 cues to reduce scapular compensations Yellow band standing row 3x10 initially requiring tactile cues to avoid shoulder extension, weaning with repetitions Finger ladder flexion x5 cues to reduce scapular compensations Shoulder ER PROM at column w/ towel support to avoid extension x5 Manual Therapy: R GH PROM flex/abd/ER (emphasis on ER); supine to pt tolerance   [facility] Adult PT Treatment:                                                DATE: 01/01/23 Therapeutic Exercise: Supine chest press + overhead pullover x15 w/ dowel  Supine R GH flexion AROM 2x8  Seated GH flexion AAROM w/ dowel x10 cues to reduce scapular compensations Yellow band standing row 3x10 initially requiring tactile cues to avoid shoulder extension, weaning with repetitions Finger ladder flexion x5 cues to reduce scapular compensations Shoulder ER PROM at column w/ towel support to avoid extension x5  Manual Therapy: R GH PROM flex/abd/ER (emphasis on ER); supine to pt  tolerance   PATIENT EDUCATION: Education details: rationale for interventions, HEP Person educated: Patient Education method: Explanation, Demonstration, Tactile cues, Verbal cues Education comprehension: verbalized understanding, returned demonstration, verbal cues required, tactile cues required  HOME EXERCISE PROGRAM: Access Code: [email or web address]   ASSESSMENT:  CLINICAL IMPRESSION: 01/08/2023 Pt arrives w/ no pain, reports surgical follow up went well. Today continuing to progress active shoulder mobility in gravity reduced positions and maximizing shoulder ER (this remains most consistent deficit). Able to progress resistance for light periscapular strengthening without any pain. No adverse events, tolerates session well overall without any pain on departure. Recommend continuing along current POC in order to address relevant deficits and improve functional tolerance. Pt departs today's session in no acute distress, all voiced questions/concerns addressed appropriately from PT perspective.      Per eval - Patient is a 57 y.o. woman who was seen today for physical  therapy evaluation and treatment for R shoulder pain s/p rTSA 10/30/22. Pt endorses difficulty with daily activities and self care as expected postoperatively. Exam limited to PROM today given proximity to surgery, measurements as above. Initiation of HEP with emphasis on appropriate mechanics within pt precautions, does well with avoiding shoulder extension with scapular retraction. Tolerates session quite well overall. No adverse events, pt reports no pain on departure. Recommend skilled PT to address aforementioned deficits with aim of improving functional tolerance and reducing pain with typical activities. Pt departs today's session in no acute distress, all voiced concerns/questions addressed appropriately from PT perspective.      OBJECTIVE IMPAIRMENTS: decreased activity tolerance, decreased endurance, decreased mobility, decreased  ROM, decreased strength, impaired UE functional use, postural dysfunction, and pain.   ACTIVITY LIMITATIONS: carrying, lifting, sleeping, bathing, reach over head, and hygiene/grooming  PARTICIPATION LIMITATIONS: meal prep, cleaning, laundry, driving, community activity, and occupation  PERSONAL FACTORS: Time since onset of injury/illness/exacerbation and 1 comorbidity: MS  are also affecting patient's functional outcome.   REHAB POTENTIAL: Good  CLINICAL DECISION MAKING: Stable/uncomplicated  EVALUATION COMPLEXITY: Low   GOALS: Goals reviewed with patient? Yes  SHORT TERM GOALS: Target date: 12/19/2022 Pt will demonstrate appropriate understanding and performance of initially prescribed HEP in order to facilitate improved independence with management of symptoms.  Baseline: HEP provided on eval 12/28/22: reports HEP going well Goal status: MET  2. Pt will score greater than or equal to 49 on FOTO in order to demonstrate improved perception of function due to symptoms.  Baseline: 34 12/28/22: 50 Goal status: ONGOING  LONG TERM GOALS: Target date: 02/08/2023 (updated 12/28/22) Pt will score 63 on FOTO in order to demonstrate improved perception of function due to symptoms. Baseline: 34 12/28/22: 50  Goal status: ONGOING  2.  Pt will demonstrate at least 140 degrees of active shoulder elevation in order to demonstrate improved tolerance to functional movement patterns such as overhead reaching and upper body dressing.  Baseline: see ROM chart above - PROM only on eval 12/28/22: see ROM chart above Goal status: ONGOING  3.  Pt will demonstrate at least 4+/5 shoulder flex/abduction MMT for improved symmetry of UE strength and improved tolerance to functional movements.  Baseline: deferred on eval given proximity to surgery 12/28/22: deferred given mobility deficits Goal status: ONGOING  4. Pt will report/demonstrate ability to perform upper body dressing with less than 2 point  increase in pain on NPS in order to indicate improved tolerance/independence with ADLs.  Baseline: increased difficulty/modification required  12/28/22: able to perform w/o pain but does require modification/effort  Goal status:ONGOING  5. Pt will report at least 50% decrease in overall pain levels in past week in order to facilitate improved tolerance to basic ADLs/mobility.   Baseline: 0-5/10  12/28/22: 0-4/10  Goal status: ONGOING  6. Pt will demonstrate appropriate performance of final prescribed HEP in order to facilitate improved self-management of symptoms post-discharge.   Baseline: initial HEP prescribed  12/28/22: HEP going well per pt report  Goal status: ONGOING  PLAN: UPDATED 12/28/22  PT FREQUENCY: 2x/week  PT DURATION: 6 weeks  PLANNED INTERVENTIONS: 97164- PT Re-evaluation, 97110-Therapeutic exercises, 97530- Therapeutic activity, 97112- Neuromuscular re-education, 97535- Self Care, [id number]- Manual therapy, Patient/Family education, Balance training, Taping, Dry Needling, Joint mobilization, Spinal mobilization, Scar mobilization, Cryotherapy, and Moist heat  PLAN FOR NEXT SESSION: Review/update HEP PRN. Symptom modification strategies as indicated/appropriate. No extension/IR. Phase 3 (weeks 6-10) AROM supine>inclined    Ashley Murrain PT, DPT 01/08/2023  10:59 AM

## 2023-01-08 ENCOUNTER — Ambulatory Visit: Payer: Medicaid Other | Admitting: Physical Therapy

## 2023-01-08 ENCOUNTER — Encounter: Payer: Self-pay | Admitting: Physical Therapy

## 2023-01-08 DIAGNOSIS — M25511 Pain in right shoulder: Secondary | ICD-10-CM | POA: Diagnosis not present

## 2023-01-08 DIAGNOSIS — M25611 Stiffness of right shoulder, not elsewhere classified: Secondary | ICD-10-CM

## 2023-01-08 DIAGNOSIS — M6281 Muscle weakness (generalized): Secondary | ICD-10-CM

## 2023-01-09 NOTE — Therapy (Signed)
OUTPATIENT PHYSICAL TREATMENT NOTE   Patient Name: Kelly Grant MRN: [id number] DOB:Mar 04, 1965, 57 y.o., female Today's Date: 01/10/2023     END OF SESSION:  PT End of Session - 01/10/23 1015     Visit Number 14    Number of Visits 22    Date for PT Re-Evaluation 02/08/23    Authorization Type [employer]    Authorization Time Period 15 visits approved 11/26/22-02/24/23    Authorization - Visit Number 13    Authorization - Number of Visits 15    PT Start Time 1015    PT Stop Time 1055    PT Time Calculation (min) 40 min    Activity Tolerance Patient tolerated treatment well;No increased pain                  Past Medical History:  Diagnosis Date   Abnormal Pap smear    ASC-H   Arthritis    Chlamydia    History of chicken pox    HSV infection    Multiple sclerosis (HCC)    Neuromuscular disorder (HCC)    MS   Yeast infection    Past Surgical History:  Procedure Laterality Date   CARPAL TUNNEL RELEASE Left    in addition to wrist fracture surgery   CESAREAN SECTION     x2   COLONOSCOPY  2016   DJ-MAC-movi(exc)-TA   INCISION AND DRAINAGE ABSCESS Left    left buttock   POLYPECTOMY  2016   TA   REVERSE SHOULDER ARTHROPLASTY Right 10/30/2022   Procedure: REVERSE SHOULDER ARTHROPLASTY;  Surgeon: Teryl Lucy, MD;  Location: [facility];  Service: Orthopedics;  Laterality: Right;   TUBAL LIGATION     Patient Active Problem List   Diagnosis Date Noted   Diabetes mellitus screening 10/01/2022   ASCUS with positive high risk HPV cervical 05/07/2022   Hyperlipidemia 09/29/2021   Encounter for therapeutic drug monitoring 12/13/2014   Multiple sclerosis (HCC) 05/08/2012   HSV infection 06/22/2011    PCP: Ivonne Andrew, NP  REFERRING PROVIDER: Teryl Lucy, MD  REFERRING DIAG: S/P Right Reverse Total Shoulder Arthroplasty DOS 10/30/2022  THERAPY DIAG:  Right shoulder pain, unspecified chronicity  Muscle weakness (generalized)  Stiffness of right  shoulder, not elsewhere classified  Rationale for Evaluation and Treatment: Rehabilitation  ONSET DATE: s/p rTSA 10/30/22  SUBJECTIVE:                                                                                                                                                                                     Per eval - Pt reports ongoing shoulder pain since August 2024 which led to rTSA on 10/30/22.  Since surgery has been using sling most of the time although she states at recent follow up with surgeon she was cleared to be out of it while sitting. Has been doing elbow/hand exercises. States she is able to do majority of self care tasks without assist but does have increased difficulty and is avoiding liting/heavier tasks as expected post op.  Hand dominance: Right  SUBJECTIVE STATEMENT: 01/10/2023 no pain at present, no new updates, HEP going well    PERTINENT HISTORY: MS, rSTA 10/30/22  PAIN:  Are you having pain: no pain Worst: 4/10 Agg: movement  Per eval -  Location/description: lateral/superior shoulder R  Best-worst over past week: 0-5/10, calms quickly   - aggravating factors: general activity, difficulty describing otherwise - Easing factors: rest, sling, medication  PRECAUTIONS: R shoulder reverse TSA 8 weeks: 12/25/22 10 weeks: 01/08/23 12 weeks: 01/22/23  WEIGHT BEARING RESTRICTIONS: Yes assumed NWB on surgical limb  FALLS:  Has patient fallen in last 6 months? No  LIVING ENVIRONMENT: Third floor apartment with three flights of stairs 62 year old son lives with her  OCCUPATION: Working prior to surgery - operator/lead on packing line at [employer]; lots of time up on feet, has to lift fairly heavy items at times   PLOF: Independent  PATIENT GOALS:  be able to use shoulder more again  NEXT MD VISIT: December 16th  OBJECTIVE:  Note: Objective measures were completed at Evaluation unless otherwise noted.  DIAGNOSTIC FINDINGS:  S/p rTSA 10/30/22  PATIENT  SURVEYS:  FOTO 34 current, 63 predicted 12/11/22 FOTO 50 12/28/22: FOTO 50  COGNITION: Overall cognitive status: Within functional limits for tasks assessed     SENSATION: No sensory complaints, not formally tested  POSTURE: Guarded posture RUE as expected, mild forward head and increased thoracic kyphosis  UPPER EXTREMITY ROM:  A/PROM Right eval Left eval Right 11/27/22 Right 11/30/22 Right 12/28/22 R 01/10/23  Shoulder flexion P: ~60 deg limited by muscle guarding   P: ~80 deg P: 96 deg painless with towel slides at table A: 88 deg (more scaption due to ER deficits) P: 121 deg  A: 101 deg stiff w/ shoulder hike, no pain (scaption rather than pure flexion)  Shoulder abduction P: ~70 deg  P: ~80 deg  P: 98 deg without pain P: 108 deg without pain, stiff end feel  Shoulder internal rotation NT       Shoulder external rotation NT    P: neutral without pain, limited by stiffness P: 3 deg with stiff end feel  Elbow flexion        Elbow extension        Wrist flexion        Wrist extension         (Blank rows = not tested) (Key: WFL = within functional limits not formally assessed, * = concordant pain, s = stiffness/stretching sensation, NT = not tested)  Comments: pt denies pain with ROM  UPPER EXTREMITY MMT:  MMT Right eval Left eval  Shoulder flexion    Shoulder extension    Shoulder abduction    Shoulder extension    Shoulder internal rotation    Shoulder external rotation    Elbow flexion    Elbow extension    Grip strength    (Blank rows = not tested)  (Key: WFL = within functional limits not formally assessed, * = concordant pain, s = stiffness/stretching sensation, NT = not tested)  Comments: deferred on eval given acuity of surgery  SHOULDER SPECIAL  TESTS: Deferred given acuity of surgery  JOINT MOBILITY TESTING:  Deferred given acuity of surgery  PALPATION:  Deferred - visual inspection of incision covered in steristrips, no apparent erythema or swelling,  grossly WNL   TODAY'S TREATMENT:   [facility] Adult PT Treatment:                                                DATE: 01/10/23 Therapeutic Exercise: Seated UE ranger ER 2x12 cues for reduced elbow compensations Standing swiss ball shoulder flexion AAROM 2x8 cues for reduced shrug and trunk lean to L  Red band row 2x12 cues for posture and reduced IR compensations UE ranger standing scaption x8 cues for setup, positioning, and reduced shoulder hike UE ranger flexion x8 cues for setup and reduced hike  Manual Therapy: Supine PROM shoulder flex, abd, ER, scaption to pt tolerance. Increased time w/ abd and ER given stiffness    [facility] Adult PT Treatment:                                                DATE: 01/08/23 Therapeutic Exercise: Supine shoulder flexion AAROM w/ dowel x10 Super shoulder flexion AROM x8 cues for comfortable ROM Supine punches 2x8 cues for comfortable ROM Seated dowel ER AAROM w/ towel tucked at elbow for support; 2x10, cues for appropriate setup/ROM Standing red band row 2x10 tactile cues to avoid extension, weaning w/ repetition, cues for posture Standing shoulder flexion finger ladder x15 cues for reduced scapular compensations Shoulder abduction isometric x8 w 3 sec hold L GH   Manual Therapy: PROM R shoulder flex/abd/scaption/ER; increased time spent w/ ER, eventually able to get to neutral. Subsequently working more on [facility] 2 elevation with improved alignment    [facility] Adult PT Treatment:                                                DATE: 01/03/23 Therapeutic Exercise: Supine chest press + overhead pullover x15 w/ dowel  Supine R GH flexion AROM x10 Seated GH flexion AAROM w/ dowel x10 cues to reduce scapular compensations Yellow band standing row 3x10 initially requiring tactile cues to avoid shoulder extension, weaning with repetitions Finger ladder flexion x5 cues to reduce scapular compensations Shoulder ER PROM at column w/ towel support to avoid extension  x5 Manual Therapy: R GH PROM flex/abd/ER (emphasis on ER); supine to pt tolerance     PATIENT EDUCATION: Education details: rationale for interventions, HEP Person educated: Patient Education method: Explanation, Demonstration, Tactile cues, Verbal cues Education comprehension: verbalized understanding, returned demonstration, verbal cues required, tactile cues required  HOME EXERCISE PROGRAM: Access Code: [email or web address]   ASSESSMENT:  CLINICAL IMPRESSION: 01/10/2023 Pt arrives w/o pain, no new updates. Remains limited primarily by stiffness although measurements are improving as above. No adverse events, tolerates session well with cues as above. Increased time spent w/ manual given continued stiffness. Recommend continuing along current POC in order to address relevant deficits and improve functional tolerance. Pt departs today's session in no acute distress, all voiced questions/concerns addressed appropriately from PT perspective.  Per eval - Patient is a 57 y.o. woman who was seen today for physical therapy evaluation and treatment for R shoulder pain s/p rTSA 10/30/22. Pt endorses difficulty with daily activities and self care as expected postoperatively. Exam limited to PROM today given proximity to surgery, measurements as above. Initiation of HEP with emphasis on appropriate mechanics within pt precautions, does well with avoiding shoulder extension with scapular retraction. Tolerates session quite well overall. No adverse events, pt reports no pain on departure. Recommend skilled PT to address aforementioned deficits with aim of improving functional tolerance and reducing pain with typical activities. Pt departs today's session in no acute distress, all voiced concerns/questions addressed appropriately from PT perspective.      OBJECTIVE IMPAIRMENTS: decreased activity tolerance, decreased endurance, decreased mobility, decreased ROM, decreased strength, impaired UE functional use,  postural dysfunction, and pain.   ACTIVITY LIMITATIONS: carrying, lifting, sleeping, bathing, reach over head, and hygiene/grooming  PARTICIPATION LIMITATIONS: meal prep, cleaning, laundry, driving, community activity, and occupation  PERSONAL FACTORS: Time since onset of injury/illness/exacerbation and 1 comorbidity: MS  are also affecting patient's functional outcome.   REHAB POTENTIAL: Good  CLINICAL DECISION MAKING: Stable/uncomplicated  EVALUATION COMPLEXITY: Low   GOALS: Goals reviewed with patient? Yes  SHORT TERM GOALS: Target date: 12/19/2022 Pt will demonstrate appropriate understanding and performance of initially prescribed HEP in order to facilitate improved independence with management of symptoms.  Baseline: HEP provided on eval 12/28/22: reports HEP going well Goal status: MET  2. Pt will score greater than or equal to 49 on FOTO in order to demonstrate improved perception of function due to symptoms.  Baseline: 34 12/28/22: 50 Goal status: ONGOING  LONG TERM GOALS: Target date: 02/08/2023 (updated 12/28/22) Pt will score 63 on FOTO in order to demonstrate improved perception of function due to symptoms. Baseline: 34 12/28/22: 50  Goal status: ONGOING  2.  Pt will demonstrate at least 140 degrees of active shoulder elevation in order to demonstrate improved tolerance to functional movement patterns such as overhead reaching and upper body dressing.  Baseline: see ROM chart above - PROM only on eval 12/28/22: see ROM chart above Goal status: ONGOING  3.  Pt will demonstrate at least 4+/5 shoulder flex/abduction MMT for improved symmetry of UE strength and improved tolerance to functional movements.  Baseline: deferred on eval given proximity to surgery 12/28/22: deferred given mobility deficits Goal status: ONGOING  4. Pt will report/demonstrate ability to perform upper body dressing with less than 2 point increase in pain on NPS in order to indicate improved  tolerance/independence with ADLs.  Baseline: increased difficulty/modification required  12/28/22: able to perform w/o pain but does require modification/effort  Goal status:ONGOING  5. Pt will report at least 50% decrease in overall pain levels in past week in order to facilitate improved tolerance to basic ADLs/mobility.   Baseline: 0-5/10  12/28/22: 0-4/10  Goal status: ONGOING  6. Pt will demonstrate appropriate performance of final prescribed HEP in order to facilitate improved self-management of symptoms post-discharge.   Baseline: initial HEP prescribed  12/28/22: HEP going well per pt report  Goal status: ONGOING  PLAN: UPDATED 12/28/22  PT FREQUENCY: 2x/week  PT DURATION: 6 weeks  PLANNED INTERVENTIONS: 97164- PT Re-evaluation, 97110-Therapeutic exercises, 97530- Therapeutic activity, 97112- Neuromuscular re-education, 97535- Self Care, 62952- Manual therapy, Patient/Family education, Balance training, Taping, Dry Needling, Joint mobilization, Spinal mobilization, Scar mobilization, Cryotherapy, and Moist heat  PLAN FOR NEXT SESSION: Review/update HEP PRN. Symptom modification strategies as indicated/appropriate. No extension/IR.  Phase 4 (weeks 10-12) up to standing AROM/AAROM, weights 1-3#   Ashley Murrain PT, DPT 01/10/2023 10:59 AM

## 2023-01-10 ENCOUNTER — Encounter: Payer: Self-pay | Admitting: Physical Therapy

## 2023-01-10 ENCOUNTER — Ambulatory Visit: Payer: Medicaid Other | Admitting: Physical Therapy

## 2023-01-10 DIAGNOSIS — M25611 Stiffness of right shoulder, not elsewhere classified: Secondary | ICD-10-CM

## 2023-01-10 DIAGNOSIS — M6281 Muscle weakness (generalized): Secondary | ICD-10-CM | POA: Diagnosis not present

## 2023-01-10 DIAGNOSIS — M25511 Pain in right shoulder: Secondary | ICD-10-CM

## 2023-01-14 NOTE — Therapy (Signed)
OUTPATIENT PHYSICAL TREATMENT NOTE   Patient Name: Kelly Grant MRN: [id number] DOB:22-Dec-1965, 57 y.o., female Today's Date: 01/15/2023     END OF SESSION:  PT End of Session - 01/15/23 1014     Visit Number 15    Number of Visits 22    Date for PT Re-Evaluation 02/08/23    Authorization Type [employer]    Authorization Time Period 15 visits approved 11/26/22-02/24/23    Authorization - Visit Number 14    Authorization - Number of Visits 15    PT Start Time 1015    PT Stop Time 1055    PT Time Calculation (min) 40 min    Activity Tolerance Patient tolerated treatment well;No increased pain                   Past Medical History:  Diagnosis Date   Abnormal Pap smear    ASC-H   Arthritis    Chlamydia    History of chicken pox    HSV infection    Multiple sclerosis (HCC)    Neuromuscular disorder (HCC)    MS   Yeast infection    Past Surgical History:  Procedure Laterality Date   CARPAL TUNNEL RELEASE Left    in addition to wrist fracture surgery   CESAREAN SECTION     x2   COLONOSCOPY  2016   DJ-MAC-movi(exc)-TA   INCISION AND DRAINAGE ABSCESS Left    left buttock   POLYPECTOMY  2016   TA   REVERSE SHOULDER ARTHROPLASTY Right 10/30/2022   Procedure: REVERSE SHOULDER ARTHROPLASTY;  Surgeon: Teryl Lucy, MD;  Location: [facility];  Service: Orthopedics;  Laterality: Right;   TUBAL LIGATION     Patient Active Problem List   Diagnosis Date Noted   Diabetes mellitus screening 10/01/2022   ASCUS with positive high risk HPV cervical 05/07/2022   Hyperlipidemia 09/29/2021   Encounter for therapeutic drug monitoring 12/13/2014   Multiple sclerosis (HCC) 05/08/2012   HSV infection 06/22/2011    PCP: Ivonne Andrew, NP  REFERRING PROVIDER: Teryl Lucy, MD  REFERRING DIAG: S/P Right Reverse Total Shoulder Arthroplasty DOS 10/30/2022  THERAPY DIAG:  Right shoulder pain, unspecified chronicity  Muscle weakness (generalized)  Stiffness of right  shoulder, not elsewhere classified  Rationale for Evaluation and Treatment: Rehabilitation  ONSET DATE: s/p rTSA 10/30/22  SUBJECTIVE:                                                                                                                                                                                     Per eval - Pt reports ongoing shoulder pain since August 2024 which led to rTSA on  10/30/22. Since surgery has been using sling most of the time although she states at recent follow up with surgeon she was cleared to be out of it while sitting. Has been doing elbow/hand exercises. States she is able to do majority of self care tasks without assist but does have increased difficulty and is avoiding liting/heavier tasks as expected post op.  Hand dominance: Right  SUBJECTIVE STATEMENT: 01/15/2023 no pain at present, does endorse some pain yesterday. HEP going well, no issues with HEP.   PERTINENT HISTORY: MS, rSTA 10/30/22  PAIN:  Are you having pain: no pain Worst: "low"/10 Agg: movement  Per eval -  Location/description: lateral/superior shoulder R  Best-worst over past week: 0-5/10, calms quickly   - aggravating factors: general activity, difficulty describing otherwise - Easing factors: rest, sling, medication  PRECAUTIONS: R shoulder reverse TSA 10 weeks: 01/08/23 12 weeks: 01/22/23  WEIGHT BEARING RESTRICTIONS: Yes assumed NWB on surgical limb  FALLS:  Has patient fallen in last 6 months? No  LIVING ENVIRONMENT: Third floor apartment with three flights of stairs 51 year old son lives with her  OCCUPATION: Working prior to surgery - operator/lead on packing line at [employer]; lots of time up on feet, has to lift fairly heavy items at times   PLOF: Independent  PATIENT GOALS:  be able to use shoulder more again  NEXT MD VISIT: January 2025  OBJECTIVE:  Note: Objective measures were completed at Evaluation unless otherwise noted.  DIAGNOSTIC FINDINGS:  S/p  rTSA 10/30/22  PATIENT SURVEYS:  FOTO 34 current, 63 predicted 12/11/22 FOTO 50 12/28/22: FOTO 50  COGNITION: Overall cognitive status: Within functional limits for tasks assessed     SENSATION: No sensory complaints, not formally tested  POSTURE: Guarded posture RUE as expected, mild forward head and increased thoracic kyphosis  UPPER EXTREMITY ROM:  A/PROM Right eval Left eval Right 11/27/22 Right 11/30/22 Right 12/28/22 R 01/10/23 R 01/15/23  Shoulder flexion P: ~60 deg limited by muscle guarding   P: ~80 deg P: 96 deg painless with towel slides at table A: 88 deg (more scaption due to ER deficits) P: 121 deg  A: 101 deg stiff w/ shoulder hike, no pain (scaption rather than pure flexion) P: 133 deg  A: 95 deg limited by stiffness rather than pain  Shoulder abduction P: ~70 deg  P: ~80 deg  P: 98 deg without pain P: 108 deg without pain, stiff end feel P: 100 deg  Shoulder internal rotation NT        Shoulder external rotation NT    P: neutral without pain, limited by stiffness P: 3 deg with stiff end feel P: just past neutral, stiff  Elbow flexion         Elbow extension         Wrist flexion         Wrist extension          (Blank rows = not tested) (Key: WFL = within functional limits not formally assessed, * = concordant pain, s = stiffness/stretching sensation, NT = not tested)  Comments: pt denies pain with ROM  UPPER EXTREMITY MMT:  MMT Right eval Left eval  Shoulder flexion    Shoulder extension    Shoulder abduction    Shoulder extension    Shoulder internal rotation    Shoulder external rotation    Elbow flexion    Elbow extension    Grip strength    (Blank rows = not tested)  (Key: WFL =  within functional limits not formally assessed, * = concordant pain, s = stiffness/stretching sensation, NT = not tested)  Comments: deferred on eval given acuity of surgery  SHOULDER SPECIAL TESTS: Deferred given acuity of surgery  JOINT MOBILITY TESTING:  Deferred  given acuity of surgery  PALPATION:  Deferred - visual inspection of incision covered in steristrips, no apparent erythema or swelling, grossly WNL   TODAY'S TREATMENT:   [facility] Adult PT Treatment:                                                DATE: 01/15/23 Therapeutic Exercise: Standing UE ranger scaption x12 cues for comfortable ROM  Standing UE ranger flexion x12 cues for reduced shoulder hike Doorway ER stretch RUE 3x30sec w/ towel support for alignment, cues for setup, appropriate ROM  Shoulder ER iso at wall x10 cues for alignment, towel support  Red band row 2x10 initial tactile cues for elbow positioning, weaning by end of first set, education on safe setup for home Supine shoulder punch x15 Supine shoulder flex AAROM hands clasped x10 HEP update + education/handout emphasis on safe form  Manual Therapy: PROM to pt tolerance focus on ER, also working on scaption/abduction/flexion    [facility] Adult PT Treatment:                                                DATE: 01/10/23 Therapeutic Exercise: Seated UE ranger ER 2x12 cues for reduced elbow compensations Standing swiss ball shoulder flexion AAROM 2x8 cues for reduced shrug and trunk lean to L  Red band row 2x12 cues for posture and reduced IR compensations UE ranger standing scaption x8 cues for setup, positioning, and reduced shoulder hike UE ranger flexion x8 cues for setup and reduced hike  Manual Therapy: Supine PROM shoulder flex, abd, ER, scaption to pt tolerance. Increased time w/ abd and ER given stiffness    [facility] Adult PT Treatment:                                                DATE: 01/08/23 Therapeutic Exercise: Supine shoulder flexion AAROM w/ dowel x10 Super shoulder flexion AROM x8 cues for comfortable ROM Supine punches 2x8 cues for comfortable ROM Seated dowel ER AAROM w/ towel tucked at elbow for support; 2x10, cues for appropriate setup/ROM Standing red band row 2x10 tactile cues to avoid extension,  weaning w/ repetition, cues for posture Standing shoulder flexion finger ladder x15 cues for reduced scapular compensations Shoulder abduction isometric x8 w 3 sec hold L GH   Manual Therapy: PROM R shoulder flex/abd/scaption/ER; increased time spent w/ ER, eventually able to get to neutral. Subsequently working more on [facility] elevation with improved alignment    PATIENT EDUCATION: Education details: rationale for interventions, HEP Person educated: Patient Education method: Programmer, multimedia, Demonstration, Actor cues, Verbal cues Education comprehension: verbalized understanding, returned demonstration, verbal cues required, tactile cues required  HOME EXERCISE PROGRAM: Access Code: [email or web address]   ASSESSMENT:  CLINICAL IMPRESSION: 01/15/2023 Pt arrives w/o pain but does endorse some aching yesterday. Continuing to work on ROM as mobility remains primary  limitation, passive mobility somewhat improved today into flexion (although more limited in abduction and active mobility). Pt tolerates exercises well with cues as above, improving form with row requiring less cueing, subsequently added to HEP. No adverse events, pt denies any pain on departure. Will require auth submission next visit. Recommend continuing along current POC in order to address relevant deficits and improve functional tolerance. Pt departs today's session in no acute distress, all voiced questions/concerns addressed appropriately from PT perspective.     Per eval - Patient is a 57 y.o. woman who was seen today for physical therapy evaluation and treatment for R shoulder pain s/p rTSA 10/30/22. Pt endorses difficulty with daily activities and self care as expected postoperatively. Exam limited to PROM today given proximity to surgery, measurements as above. Initiation of HEP with emphasis on appropriate mechanics within pt precautions, does well with avoiding shoulder extension with scapular retraction. Tolerates session quite well  overall. No adverse events, pt reports no pain on departure. Recommend skilled PT to address aforementioned deficits with aim of improving functional tolerance and reducing pain with typical activities. Pt departs today's session in no acute distress, all voiced concerns/questions addressed appropriately from PT perspective.      OBJECTIVE IMPAIRMENTS: decreased activity tolerance, decreased endurance, decreased mobility, decreased ROM, decreased strength, impaired UE functional use, postural dysfunction, and pain.   ACTIVITY LIMITATIONS: carrying, lifting, sleeping, bathing, reach over head, and hygiene/grooming  PARTICIPATION LIMITATIONS: meal prep, cleaning, laundry, driving, community activity, and occupation  PERSONAL FACTORS: Time since onset of injury/illness/exacerbation and 1 comorbidity: MS  are also affecting patient's functional outcome.   REHAB POTENTIAL: Good  CLINICAL DECISION MAKING: Stable/uncomplicated  EVALUATION COMPLEXITY: Low   GOALS: Goals reviewed with patient? Yes  SHORT TERM GOALS: Target date: 12/19/2022 Pt will demonstrate appropriate understanding and performance of initially prescribed HEP in order to facilitate improved independence with management of symptoms.  Baseline: HEP provided on eval 12/28/22: reports HEP going well Goal status: MET  2. Pt will score greater than or equal to 49 on FOTO in order to demonstrate improved perception of function due to symptoms.  Baseline: 34 12/28/22: 50 Goal status: ONGOING  LONG TERM GOALS: Target date: 02/08/2023 (updated 12/28/22) Pt will score 63 on FOTO in order to demonstrate improved perception of function due to symptoms. Baseline: 34 12/28/22: 50  Goal status: ONGOING  2.  Pt will demonstrate at least 140 degrees of active shoulder elevation in order to demonstrate improved tolerance to functional movement patterns such as overhead reaching and upper body dressing.  Baseline: see ROM chart above - PROM  only on eval 12/28/22: see ROM chart above Goal status: ONGOING  3.  Pt will demonstrate at least 4+/5 shoulder flex/abduction MMT for improved symmetry of UE strength and improved tolerance to functional movements.  Baseline: deferred on eval given proximity to surgery 12/28/22: deferred given mobility deficits Goal status: ONGOING  4. Pt will report/demonstrate ability to perform upper body dressing with less than 2 point increase in pain on NPS in order to indicate improved tolerance/independence with ADLs.  Baseline: increased difficulty/modification required  12/28/22: able to perform w/o pain but does require modification/effort  Goal status:ONGOING  5. Pt will report at least 50% decrease in overall pain levels in past week in order to facilitate improved tolerance to basic ADLs/mobility.   Baseline: 0-5/10  12/28/22: 0-4/10  Goal status: ONGOING  6. Pt will demonstrate appropriate performance of final prescribed HEP in order to facilitate improved self-management of  symptoms post-discharge.   Baseline: initial HEP prescribed  12/28/22: HEP going well per pt report  Goal status: ONGOING  PLAN: UPDATED 12/28/22  PT FREQUENCY: 2x/week  PT DURATION: 6 weeks  PLANNED INTERVENTIONS: 97164- PT Re-evaluation, 97110-Therapeutic exercises, 97530- Therapeutic activity, 97112- Neuromuscular re-education, 97535- Self Care, 29528- Manual therapy, Patient/Family education, Balance training, Taping, Dry Needling, Joint mobilization, Spinal mobilization, Scar mobilization, Cryotherapy, and Moist heat  PLAN FOR NEXT SESSION: Review/update HEP PRN. Symptom modification strategies as indicated/appropriate. No extension/IR. Phase 4 (weeks 10-12) up to standing AROM/AAROM, weights 1-3#   Ashley Murrain PT, DPT 01/15/2023 10:59 AM

## 2023-01-15 ENCOUNTER — Telehealth: Payer: Self-pay

## 2023-01-15 ENCOUNTER — Ambulatory Visit: Payer: Medicaid Other | Admitting: Physical Therapy

## 2023-01-15 ENCOUNTER — Encounter: Payer: Self-pay | Admitting: Physical Therapy

## 2023-01-15 DIAGNOSIS — M25611 Stiffness of right shoulder, not elsewhere classified: Secondary | ICD-10-CM

## 2023-01-15 DIAGNOSIS — M6281 Muscle weakness (generalized): Secondary | ICD-10-CM

## 2023-01-15 DIAGNOSIS — M25511 Pain in right shoulder: Secondary | ICD-10-CM | POA: Diagnosis not present

## 2023-01-15 NOTE — Telephone Encounter (Signed)
CALLED PATIENT TO RESCHEDULE APPT ON 01/09. OFFERING 1:15PM

## 2023-01-17 ENCOUNTER — Ambulatory Visit: Payer: Medicaid Other | Admitting: Physical Therapy

## 2023-01-17 ENCOUNTER — Encounter: Payer: Self-pay | Admitting: Physical Therapy

## 2023-01-17 ENCOUNTER — Other Ambulatory Visit: Payer: Self-pay

## 2023-01-17 ENCOUNTER — Other Ambulatory Visit (HOSPITAL_COMMUNITY): Payer: Self-pay

## 2023-01-17 DIAGNOSIS — M6281 Muscle weakness (generalized): Secondary | ICD-10-CM | POA: Diagnosis not present

## 2023-01-17 DIAGNOSIS — M25611 Stiffness of right shoulder, not elsewhere classified: Secondary | ICD-10-CM

## 2023-01-17 DIAGNOSIS — M25511 Pain in right shoulder: Secondary | ICD-10-CM

## 2023-01-17 NOTE — Therapy (Signed)
OUTPATIENT PHYSICAL TREATMENT NOTE   Patient Name: Kelly Grant MRN: [id number] DOB:Jul 06, 1965, 57 y.o., female Today's Date: 01/17/2023     END OF SESSION:  PT End of Session - 01/17/23 1016     Visit Number 16    Number of Visits 22    Date for PT Re-Evaluation 02/08/23    Authorization Type [employer]    Authorization Time Period 15 visits approved 11/26/22-02/24/23    Authorization - Visit Number 15    Authorization - Number of Visits 15    PT Start Time 1016    PT Stop Time 1056    PT Time Calculation (min) 40 min                    Past Medical History:  Diagnosis Date   Abnormal Pap smear    ASC-H   Arthritis    Chlamydia    History of chicken pox    HSV infection    Multiple sclerosis (HCC)    Neuromuscular disorder (HCC)    MS   Yeast infection    Past Surgical History:  Procedure Laterality Date   CARPAL TUNNEL RELEASE Left    in addition to wrist fracture surgery   CESAREAN SECTION     x2   COLONOSCOPY  2016   DJ-MAC-movi(exc)-TA   INCISION AND DRAINAGE ABSCESS Left    left buttock   POLYPECTOMY  2016   TA   REVERSE SHOULDER ARTHROPLASTY Right 10/30/2022   Procedure: REVERSE SHOULDER ARTHROPLASTY;  Surgeon: Teryl Lucy, MD;  Location: [facility];  Service: Orthopedics;  Laterality: Right;   TUBAL LIGATION     Patient Active Problem List   Diagnosis Date Noted   Diabetes mellitus screening 10/01/2022   ASCUS with positive high risk HPV cervical 05/07/2022   Hyperlipidemia 09/29/2021   Encounter for therapeutic drug monitoring 12/13/2014   Multiple sclerosis (HCC) 05/08/2012   HSV infection 06/22/2011    PCP: Ivonne Andrew, NP  REFERRING PROVIDER: Teryl Lucy, MD  REFERRING DIAG: S/P Right Reverse Total Shoulder Arthroplasty DOS 10/30/2022  THERAPY DIAG:  Right shoulder pain, unspecified chronicity  Muscle weakness (generalized)  Stiffness of right shoulder, not elsewhere classified  Rationale for Evaluation and Treatment:  Rehabilitation  ONSET DATE: s/p rTSA 10/30/22  SUBJECTIVE:                                                                                                                                                                                     Per eval - Pt reports ongoing shoulder pain since August 2024 which led to rTSA on 10/30/22. Since surgery has been using sling most of the  time although she states at recent follow up with surgeon she was cleared to be out of it while sitting. Has been doing elbow/hand exercises. States she is able to do majority of self care tasks without assist but does have increased difficulty and is avoiding liting/heavier tasks as expected post op.  Hand dominance: Right  SUBJECTIVE STATEMENT: 01/17/2023 0/10 pain at present, no issues after last session. HEP going well, doing twice a day. Since beginning PT pt states she has noted functional improvement with self care activities and ADLs. Remains limited with overhead reaching    PERTINENT HISTORY: MS, rSTA 10/30/22  PAIN:  Are you having pain: no pain Worst: 3/10, aching in lateral shoulder Agg: movement  Per eval -  Location/description: lateral/superior shoulder R  Best-worst over past week: 0-5/10, calms quickly   - aggravating factors: general activity, difficulty describing otherwise - Easing factors: rest, sling, medication  PRECAUTIONS: R shoulder reverse TSA 10 weeks: 01/08/23 12 weeks: 01/22/23  WEIGHT BEARING RESTRICTIONS: Yes assumed NWB on surgical limb  FALLS:  Has patient fallen in last 6 months? No  LIVING ENVIRONMENT: Third floor apartment with three flights of stairs 30 year old son lives with her  OCCUPATION: Working prior to surgery - operator/lead on packing line at [employer]; lots of time up on feet, has to lift fairly heavy items at times   PLOF: Independent  PATIENT GOALS:  be able to use shoulder more again  NEXT MD VISIT: January 2025  OBJECTIVE:  Note: Objective measures  were completed at Evaluation unless otherwise noted.  DIAGNOSTIC FINDINGS:  S/p rTSA 10/30/22  PATIENT SURVEYS:  FOTO 34 current, 63 predicted 12/11/22 FOTO 50 12/28/22: FOTO 50 01/17/23 FOTO 54  COGNITION: Overall cognitive status: Within functional limits for tasks assessed     SENSATION: No sensory complaints, not formally tested  POSTURE: Guarded posture RUE as expected, mild forward head and increased thoracic kyphosis  UPPER EXTREMITY ROM:  A/PROM Right eval Left eval Right 11/27/22 Right 11/30/22 Right 12/28/22 R 01/10/23 R 01/15/23 01/17/23   Shoulder flexion P: ~60 deg limited by muscle guarding   P: ~80 deg P: 96 deg painless with towel slides at table A: 88 deg (more scaption due to ER deficits) P: 121 deg  A: 101 deg stiff w/ shoulder hike, no pain (scaption rather than pure flexion) P: 133 deg  A: 95 deg limited by stiffness rather than pain A: seated 96 deg s, pre session  A: supine 131 deg  A: post session seated  106 deg with mild hike   P: 136 deg s  Shoulder abduction P: ~70 deg  P: ~80 deg  P: 98 deg without pain P: 108 deg without pain, stiff end feel P: 100 deg   Shoulder internal rotation NT         Shoulder external rotation NT    P: neutral without pain, limited by stiffness P: 3 deg with stiff end feel P: just past neutral, stiff P: 6 deg, painless but firm end feel  Elbow flexion          Elbow extension          Wrist flexion          Wrist extension           (Blank rows = not tested) (Key: WFL = within functional limits not formally assessed, * = concordant pain, s = stiffness/stretching sensation, NT = not tested)  Comments: pt denies pain with ROM  UPPER  EXTREMITY MMT:  MMT Right eval Left eval  Shoulder flexion    Shoulder extension    Shoulder abduction    Shoulder extension    Shoulder internal rotation    Shoulder external rotation    Elbow flexion    Elbow extension    Grip strength    (Blank rows = not tested)  (Key: WFL =  within functional limits not formally assessed, * = concordant pain, s = stiffness/stretching sensation, NT = not tested)  Comments: deferred on eval given acuity of surgery  SHOULDER SPECIAL TESTS: Deferred given acuity of surgery  JOINT MOBILITY TESTING:  Deferred given acuity of surgery  PALPATION:  Deferred - visual inspection of incision covered in steristrips, no apparent erythema or swelling, grossly WNL   TODAY'S TREATMENT:   [facility] Adult PT Treatment:                                                DATE: 01/17/23 Therapeutic Exercise: Supine punch + OH pullover x12 Supine OH pullover w/ dowel x12 Supine flexion AROM RUE x8 Supine ER AROM w/ towel support for appropriate positioning x10 cues for form/alignment  Red band row 2x15 cues for elbow positioning Yellow band shoulder extension x8 cues for posture, periscapular focus, appropriate ROM UE ranger flexion, standing x8  Manual Therapy: Supine PROM to pt tolerance flex/abd/scap/ER   Therapeutic Activity: MSK assessent + education FOTO + education Goals discussion/education, symptom behavior as it affects activity tolerance    [facility] Adult PT Treatment:                                                DATE: 01/15/23 Therapeutic Exercise: Standing UE ranger scaption x12 cues for comfortable ROM  Standing UE ranger flexion x12 cues for reduced shoulder hike Doorway ER stretch RUE 3x30sec w/ towel support for alignment, cues for setup, appropriate ROM  Shoulder ER iso at wall x10 cues for alignment, towel support  Red band row 2x10 initial tactile cues for elbow positioning, weaning by end of first set, education on safe setup for home Supine shoulder punch x15 Supine shoulder flex AAROM hands clasped x10 HEP update + education/handout emphasis on safe form  Manual Therapy: PROM to pt tolerance focus on ER, also working on scaption/abduction/flexion    [facility] Adult PT Treatment:                                                 DATE: 01/10/23 Therapeutic Exercise: Seated UE ranger ER 2x12 cues for reduced elbow compensations Standing swiss ball shoulder flexion AAROM 2x8 cues for reduced shrug and trunk lean to L  Red band row 2x12 cues for posture and reduced IR compensations UE ranger standing scaption x8 cues for setup, positioning, and reduced shoulder hike UE ranger flexion x8 cues for setup and reduced hike  Manual Therapy: Supine PROM shoulder flex, abd, ER, scaption to pt tolerance. Increased time w/ abd and ER given stiffness    PATIENT EDUCATION: Education details: rationale for interventions, HEP Person educated: Patient Education method: Explanation, Demonstration, Tactile cues, Verbal  cues Education comprehension: verbalized understanding, returned demonstration, verbal cues required, tactile cues required  HOME EXERCISE PROGRAM: Access Code: [email or web address]   ASSESSMENT:  CLINICAL IMPRESSION: 01/17/2023 Pt arrives w/o pain, continues to endorse steady progress in regards to ADLs and functional mobility, remains limited with overhead reaching and reaching behind back as expected. Pt now >11 weeks post op and is making progress with ROM although not quite at PT goals. Anticipate she would benefit from continued PT to address remaining mobility deficits and initiate strengthening per post op protocol. No adverse events, tolerates treatment well today and departs without pain. Recommend continuing along current POC in order to address relevant deficits and improve functional tolerance. Pt departs today's session in no acute distress, all voiced questions/concerns addressed appropriately from PT perspective.        Per eval - Patient is a 57 y.o. woman who was seen today for physical therapy evaluation and treatment for R shoulder pain s/p rTSA 10/30/22. Pt endorses difficulty with daily activities and self care as expected postoperatively. Exam limited to PROM today given proximity to surgery,  measurements as above. Initiation of HEP with emphasis on appropriate mechanics within pt precautions, does well with avoiding shoulder extension with scapular retraction. Tolerates session quite well overall. No adverse events, pt reports no pain on departure. Recommend skilled PT to address aforementioned deficits with aim of improving functional tolerance and reducing pain with typical activities. Pt departs today's session in no acute distress, all voiced concerns/questions addressed appropriately from PT perspective.      OBJECTIVE IMPAIRMENTS: decreased activity tolerance, decreased endurance, decreased mobility, decreased ROM, decreased strength, impaired UE functional use, postural dysfunction, and pain.   ACTIVITY LIMITATIONS: carrying, lifting, sleeping, bathing, reach over head, and hygiene/grooming  PARTICIPATION LIMITATIONS: meal prep, cleaning, laundry, driving, community activity, and occupation  PERSONAL FACTORS: Time since onset of injury/illness/exacerbation and 1 comorbidity: MS  are also affecting patient's functional outcome.   REHAB POTENTIAL: Good  CLINICAL DECISION MAKING: Stable/uncomplicated  EVALUATION COMPLEXITY: Low   GOALS: Goals reviewed with patient? Yes  SHORT TERM GOALS: Target date: 12/19/2022 Pt will demonstrate appropriate understanding and performance of initially prescribed HEP in order to facilitate improved independence with management of symptoms.  Baseline: HEP provided on eval 12/28/22: reports HEP going well Goal status: MET  2. Pt will score greater than or equal to 49 on FOTO in order to demonstrate improved perception of function due to symptoms.  Baseline: 34 12/28/22: 50 01/17/23: 54 Goal status: MET  LONG TERM GOALS: Target date: 02/08/2023 (updated 12/28/22) Pt will score 63 on FOTO in order to demonstrate improved perception of function due to symptoms. Baseline: 34 12/28/22: 50  01/17/23: 54 Goal status: PROGRESSING  2.  Pt will  demonstrate at least 140 degrees of active shoulder elevation in order to demonstrate improved tolerance to functional movement patterns such as overhead reaching and upper body dressing.  Baseline: see ROM chart above - PROM only on eval 12/28/22: see ROM chart above 01/17/23: see ROM chart above  Goal status: PROGRESSING  3.  Pt will demonstrate at least 4+/5 shoulder flex/abduction MMT for improved symmetry of UE strength and improved tolerance to functional movements.  Baseline: deferred on eval given proximity to surgery 01/17/23: deferred given mobility deficits and post op protocol Goal status: ONGOING  4. Pt will report/demonstrate ability to perform upper body dressing with less than 2 point increase in pain on NPS in order to indicate improved tolerance/independence with ADLs.  Baseline: increased difficulty/modification  required  12/28/22: able to perform w/o pain but does require modification/effort  01/17/23: able to perform dressing tasks without modification  Goal status: MET  5. Pt will report at least 50% decrease in overall pain levels in past week in order to facilitate improved tolerance to basic ADLs/mobility.   Baseline: 0-5/10  12/28/22: 0-4/10  01/17/23: 0-3/10  Goal status: ONGOING  6. Pt will demonstrate appropriate performance of final prescribed HEP in order to facilitate improved self-management of symptoms post-discharge.   Baseline: initial HEP prescribed  01/17/23: HEP going well per pt report  Goal status: ONGOING  PLAN: UPDATED 12/28/22  PT FREQUENCY: 2x/week  PT DURATION: 6 weeks  PLANNED INTERVENTIONS: 97164- PT Re-evaluation, 97110-Therapeutic exercises, 97530- Therapeutic activity, 97112- Neuromuscular re-education, 97535- Self Care, 16109- Manual therapy, Patient/Family education, Balance training, Taping, Dry Needling, Joint mobilization, Spinal mobilization, Scar mobilization, Cryotherapy, and Moist heat  PLAN FOR NEXT SESSION: Review/update  HEP PRN. Symptom modification strategies as indicated/appropriate. No extension/IR. Phase 4 (weeks 10-12) up to standing AROM/AAROM, weights 1-3#   Ashley Murrain PT, DPT 01/17/2023 11:44 AM    For all possible CPT codes, reference the Planned Interventions line above.     Check all conditions that are expected to impact treatment: {Conditions expected to impact treatment:Musculoskeletal disorders   If treatment provided at initial evaluation, no treatment charged due to lack of authorization.

## 2023-01-21 ENCOUNTER — Other Ambulatory Visit (HOSPITAL_COMMUNITY): Payer: Self-pay

## 2023-01-21 ENCOUNTER — Other Ambulatory Visit: Payer: Self-pay

## 2023-01-21 NOTE — Progress Notes (Signed)
Specialty Pharmacy Refill Coordination Note  Kelly Grant is a 57 y.o. female contacted today regarding refills of specialty medication(s) Fingolimod HCl   Patient requested [name] at [facility] at [facility] date: 01/28/23   Medication will be filled on 01/28/23.

## 2023-01-21 NOTE — Therapy (Signed)
 OUTPATIENT PHYSICAL TREATMENT NOTE   Patient Name: Kelly Grant MRN: [id number] DOB:06/03/1965, 57 y.o., female Today's Date: 01/22/2023     END OF SESSION:  PT End of Session - 01/22/23 0853     Visit Number 17    Number of Visits 22    Date for PT Re-Evaluation 02/08/23    Authorization Type [employer]    Authorization Time Period Approved 6 visits 01/21/23-03/21/23    Authorization - Visit Number 1    Authorization - Number of Visits 6    PT Start Time 0855   late check in   PT Stop Time 0925    PT Time Calculation (min) 30 min    Activity Tolerance Patient tolerated treatment well;No increased pain                     Past Medical History:  Diagnosis Date   Abnormal Pap smear    ASC-H   Arthritis    Chlamydia    History of chicken pox    HSV infection    Multiple sclerosis (HCC)    Neuromuscular disorder (HCC)    MS   Yeast infection    Past Surgical History:  Procedure Laterality Date   CARPAL TUNNEL RELEASE Left    in addition to wrist fracture surgery   CESAREAN SECTION     x2   COLONOSCOPY  2016   DJ-MAC-movi(exc)-TA   INCISION AND DRAINAGE ABSCESS Left    left buttock   POLYPECTOMY  2016   TA   REVERSE SHOULDER ARTHROPLASTY Right 10/30/2022   Procedure: REVERSE SHOULDER ARTHROPLASTY;  Surgeon: Josefina Chew, MD;  Location: [facility];  Service: Orthopedics;  Laterality: Right;   TUBAL LIGATION     Patient Active Problem List   Diagnosis Date Noted   Diabetes mellitus screening 10/01/2022   ASCUS with positive high risk HPV cervical 05/07/2022   Hyperlipidemia 09/29/2021   Encounter for therapeutic drug monitoring 12/13/2014   Multiple sclerosis (HCC) 05/08/2012   HSV infection 06/22/2011    PCP: Oley Bascom RAMAN, NP  REFERRING PROVIDER: Josefina Chew, MD  REFERRING DIAG: S/P Right Reverse Total Shoulder Arthroplasty DOS 10/30/2022  THERAPY DIAG:  Right shoulder pain, unspecified chronicity  Muscle weakness  (generalized)  Stiffness of right shoulder, not elsewhere classified  Rationale for Evaluation and Treatment: Rehabilitation  ONSET DATE: s/p rTSA 10/30/22  SUBJECTIVE:                                                                                                                                                                                     Per eval - Pt reports ongoing shoulder pain since August  2024 which led to rTSA on 10/30/22. Since surgery has been using sling most of the time although she states at recent follow up with surgeon she was cleared to be out of it while sitting. Has been doing elbow/hand exercises. States she is able to do majority of self care tasks without assist but does have increased difficulty and is avoiding liting/heavier tasks as expected post op.  Hand dominance: Right  SUBJECTIVE STATEMENT: 01/22/2023 No pain at present, no issues after last session, no other new updates. HEP going well.      PERTINENT HISTORY: MS, rSTA 10/30/22  PAIN:  Are you having pain: no pain Worst: 3/10, aching in lateral shoulder Agg: movement  Per eval -  Location/description: lateral/superior shoulder R  Best-worst over past week: 0-5/10, calms quickly   - aggravating factors: general activity, difficulty describing otherwise - Easing factors: rest, sling, medication  PRECAUTIONS: R shoulder reverse TSA 10 weeks: 01/08/23 12 weeks: 01/22/23  WEIGHT BEARING RESTRICTIONS: Yes assumed NWB on surgical limb  FALLS:  Has patient fallen in last 6 months? No  LIVING ENVIRONMENT: Third floor apartment with three flights of stairs 31 year old son lives with her  OCCUPATION: Working prior to surgery - operator/lead on packing line at [employer]; lots of time up on feet, has to lift fairly heavy items at times   PLOF: Independent  PATIENT GOALS:  be able to use shoulder more again  NEXT MD VISIT: January 2025  OBJECTIVE:  Note: Objective measures were completed at  Evaluation unless otherwise noted.  DIAGNOSTIC FINDINGS:  S/p rTSA 10/30/22  PATIENT SURVEYS:  FOTO 34 current, 63 predicted 12/11/22 FOTO 50 12/28/22: FOTO 50 01/17/23 FOTO 54  COGNITION: Overall cognitive status: Within functional limits for tasks assessed     SENSATION: No sensory complaints, not formally tested  POSTURE: Guarded posture RUE as expected, mild forward head and increased thoracic kyphosis  UPPER EXTREMITY ROM:  A/PROM Right eval Left eval Right 11/27/22 Right 11/30/22 Right 12/28/22 R 01/10/23 R 01/15/23 01/17/23   Shoulder flexion P: ~60 deg limited by muscle guarding   P: ~80 deg P: 96 deg painless with towel slides at table A: 88 deg (more scaption due to ER deficits) P: 121 deg  A: 101 deg stiff w/ shoulder hike, no pain (scaption rather than pure flexion) P: 133 deg  A: 95 deg limited by stiffness rather than pain A: seated 96 deg s, pre session  A: supine 131 deg  A: post session seated  106 deg with mild hike   P: 136 deg s  Shoulder abduction P: ~70 deg  P: ~80 deg  P: 98 deg without pain P: 108 deg without pain, stiff end feel P: 100 deg   Shoulder internal rotation NT         Shoulder external rotation NT    P: neutral without pain, limited by stiffness P: 3 deg with stiff end feel P: just past neutral, stiff P: 6 deg, painless but firm end feel  Elbow flexion          Elbow extension          Wrist flexion          Wrist extension           (Blank rows = not tested) (Key: WFL = within functional limits not formally assessed, * = concordant pain, s = stiffness/stretching sensation, NT = not tested)  Comments: pt denies pain with ROM  UPPER EXTREMITY MMT:  MMT Right eval Left eval  Shoulder flexion    Shoulder extension    Shoulder abduction    Shoulder extension    Shoulder internal rotation    Shoulder external rotation    Elbow flexion    Elbow extension    Grip strength    (Blank rows = not tested)  (Key: WFL = within functional  limits not formally assessed, * = concordant pain, s = stiffness/stretching sensation, NT = not tested)  Comments: deferred on eval given acuity of surgery  SHOULDER SPECIAL TESTS: Deferred given acuity of surgery  JOINT MOBILITY TESTING:  Deferred given acuity of surgery  PALPATION:  Deferred - visual inspection of incision covered in steristrips, no apparent erythema or swelling, grossly WNL   TODAY'S TREATMENT:   [facility] Adult PT Treatment:                                                DATE: 01/22/23 Therapeutic Exercise: Supine chest press 1# DB 2x8 cues for pacing and form Supine shoulder flexion RUE x12 unresisted Supine shoulder flexion 1# DB, short lever up to 90deg 2x5 Green band row 2x8   Manual Therapy: PROM to pt tolerance flex/abd/scaption/ER, increased time spent    [facility] Adult PT Treatment:                                                DATE: 01/17/23 Therapeutic Exercise: Supine punch + OH pullover x12 Supine OH pullover w/ dowel x12 Supine flexion AROM RUE x8 Supine ER AROM w/ towel support for appropriate positioning x10 cues for form/alignment  Red band row 2x15 cues for elbow positioning Yellow band shoulder extension x8 cues for posture, periscapular focus, appropriate ROM UE ranger flexion, standing x8  Manual Therapy: Supine PROM to pt tolerance flex/abd/scap/ER   Therapeutic Activity: MSK assessent + education FOTO + education Goals discussion/education, symptom behavior as it affects activity tolerance    [facility] Adult PT Treatment:                                                DATE: 01/15/23 Therapeutic Exercise: Standing UE ranger scaption x12 cues for comfortable ROM  Standing UE ranger flexion x12 cues for reduced shoulder hike Doorway ER stretch RUE 3x30sec w/ towel support for alignment, cues for setup, appropriate ROM  Shoulder ER iso at wall x10 cues for alignment, towel support  Red band row 2x10 initial tactile cues for elbow  positioning, weaning by end of first set, education on safe setup for home Supine shoulder punch x15 Supine shoulder flex AAROM hands clasped x10 HEP update + education/handout emphasis on safe form  Manual Therapy: PROM to pt tolerance focus on ER, also working on scaption/abduction/flexion    [facility] Adult PT Treatment:                                                DATE: 01/10/23 Therapeutic Exercise: Seated UE ranger ER  2x12 cues for reduced elbow compensations Standing swiss ball shoulder flexion AAROM 2x8 cues for reduced shrug and trunk lean to L  Red band row 2x12 cues for posture and reduced IR compensations UE ranger standing scaption x8 cues for setup, positioning, and reduced shoulder hike UE ranger flexion x8 cues for setup and reduced hike  Manual Therapy: Supine PROM shoulder flex, abd, ER, scaption to pt tolerance. Increased time w/ abd and ER given stiffness    PATIENT EDUCATION: Education details: rationale for interventions, HEP Person educated: Patient Education method: Explanation, Demonstration, Tactile cues, Verbal cues Education comprehension: verbalized understanding, returned demonstration, verbal cues required, tactile cues required  HOME EXERCISE PROGRAM: Access Code: [email or web address]   ASSESSMENT:  CLINICAL IMPRESSION: 01/22/2023 Pt arrives w/o pain 12 weeks post op, no issues after last session. Today continuing to focus on manual given mobility limitations but did initiate light GH resistance training in gravity eliminated positions and with short lever arms. Pt tolerates well without any pain, no adverse events.  Recommend continuing along current POC in order to address relevant deficits and improve functional tolerance. Pt departs today's session in no acute distress, all voiced questions/concerns addressed appropriately from PT perspective.        Per eval - Patient is a 57 y.o. woman who was seen today for physical therapy evaluation and treatment for  R shoulder pain s/p rTSA 10/30/22. Pt endorses difficulty with daily activities and self care as expected postoperatively. Exam limited to PROM today given proximity to surgery, measurements as above. Initiation of HEP with emphasis on appropriate mechanics within pt precautions, does well with avoiding shoulder extension with scapular retraction. Tolerates session quite well overall. No adverse events, pt reports no pain on departure. Recommend skilled PT to address aforementioned deficits with aim of improving functional tolerance and reducing pain with typical activities. Pt departs today's session in no acute distress, all voiced concerns/questions addressed appropriately from PT perspective.      OBJECTIVE IMPAIRMENTS: decreased activity tolerance, decreased endurance, decreased mobility, decreased ROM, decreased strength, impaired UE functional use, postural dysfunction, and pain.   ACTIVITY LIMITATIONS: carrying, lifting, sleeping, bathing, reach over head, and hygiene/grooming  PARTICIPATION LIMITATIONS: meal prep, cleaning, laundry, driving, community activity, and occupation  PERSONAL FACTORS: Time since onset of injury/illness/exacerbation and 1 comorbidity: MS  are also affecting patient's functional outcome.   REHAB POTENTIAL: Good  CLINICAL DECISION MAKING: Stable/uncomplicated  EVALUATION COMPLEXITY: Low   GOALS: Goals reviewed with patient? Yes  SHORT TERM GOALS: Target date: 12/19/2022 Pt will demonstrate appropriate understanding and performance of initially prescribed HEP in order to facilitate improved independence with management of symptoms.  Baseline: HEP provided on eval 12/28/22: reports HEP going well Goal status: MET  2. Pt will score greater than or equal to 49 on FOTO in order to demonstrate improved perception of function due to symptoms.  Baseline: 34 12/28/22: 50 01/17/23: 54 Goal status: MET  LONG TERM GOALS: Target date: 02/08/2023 (updated 12/28/22) Pt  will score 63 on FOTO in order to demonstrate improved perception of function due to symptoms. Baseline: 34 12/28/22: 50  01/17/23: 54 Goal status: PROGRESSING  2.  Pt will demonstrate at least 140 degrees of active shoulder elevation in order to demonstrate improved tolerance to functional movement patterns such as overhead reaching and upper body dressing.  Baseline: see ROM chart above - PROM only on eval 12/28/22: see ROM chart above 01/17/23: see ROM chart above  Goal status: PROGRESSING  3.  Pt will demonstrate  at least 4+/5 shoulder flex/abduction MMT for improved symmetry of UE strength and improved tolerance to functional movements.  Baseline: deferred on eval given proximity to surgery 01/17/23: deferred given mobility deficits and post op protocol Goal status: ONGOING  4. Pt will report/demonstrate ability to perform upper body dressing with less than 2 point increase in pain on NPS in order to indicate improved tolerance/independence with ADLs.  Baseline: increased difficulty/modification required  12/28/22: able to perform w/o pain but does require modification/effort  01/17/23: able to perform dressing tasks without modification  Goal status: MET  5. Pt will report at least 50% decrease in overall pain levels in past week in order to facilitate improved tolerance to basic ADLs/mobility.   Baseline: 0-5/10  12/28/22: 0-4/10  01/17/23: 0-3/10  Goal status: ONGOING  6. Pt will demonstrate appropriate performance of final prescribed HEP in order to facilitate improved self-management of symptoms post-discharge.   Baseline: initial HEP prescribed  01/17/23: HEP going well per pt report  Goal status: ONGOING  PLAN: UPDATED 12/28/22  PT FREQUENCY: 2x/week  PT DURATION: 6 weeks  PLANNED INTERVENTIONS: 97164- PT Re-evaluation, 97110-Therapeutic exercises, 97530- Therapeutic activity, 97112- Neuromuscular re-education, 97535- Self Care, [id number]- Manual therapy, Patient/Family  education, Balance training, Taping, Dry Needling, Joint mobilization, Spinal mobilization, Scar mobilization, Cryotherapy, and Moist heat  PLAN FOR NEXT SESSION: Review/update HEP PRN. Symptom modification strategies as indicated/appropriate. No extension/IR.   Beverley Millman reverse TSA protocol: Phase V (12 wks-16 wks): Progress with strength (visits as needed) Weight bearing: Advance to tolerance. ROM: No restriction. Begin light stretching or Extension and Internal Rotation,  adjusting progression based on tolerance. Progress end range stretching in all  other planes.  Exercises: Progress slowly with strengthening into IR / Ext past neutral. All  other motions advance as tolerated with bands and light weights (1-5 lbs) with 8- 12 reps x 2-3 sets for cuff, deltoid, scapular stabilizers. Strengthening to be  performed every 2-3 days at home. Never consecutive days. Start light gym  weight training activities.   Alm DELENA Jenny PT, DPT 01/22/2023 9:27 AM

## 2023-01-21 NOTE — Progress Notes (Signed)
Specialty Pharmacy Ongoing Clinical Assessment Note  Kelly Grant is a 57 y.o. female who is being followed by the specialty pharmacy service for RxSp Multiple Sclerosis   Patient's specialty medication(s) reviewed today: Fingolimod HCl   Missed doses in the last 4 weeks: 0   Patient/Caregiver did not have any additional questions or concerns.   Therapeutic benefit summary: Patient is achieving benefit   Adverse events/side effects summary: No adverse events/side effects   Patient's therapy is appropriate to: Continue    Goals Addressed             This Visit's Progress    Stabilization of disease       Patient is on track. Patient will maintain adherence.  Patient's MS remains stable at this time.          Follow up:  6 months  Servando Snare Specialty Pharmacist

## 2023-01-22 ENCOUNTER — Encounter: Payer: Self-pay | Admitting: Physical Therapy

## 2023-01-22 ENCOUNTER — Ambulatory Visit: Payer: Medicaid Other | Admitting: Physical Therapy

## 2023-01-22 DIAGNOSIS — M25611 Stiffness of right shoulder, not elsewhere classified: Secondary | ICD-10-CM

## 2023-01-22 DIAGNOSIS — M25511 Pain in right shoulder: Secondary | ICD-10-CM

## 2023-01-22 DIAGNOSIS — M6281 Muscle weakness (generalized): Secondary | ICD-10-CM | POA: Diagnosis not present

## 2023-01-24 ENCOUNTER — Ambulatory Visit: Payer: Medicaid Other | Attending: Orthopedic Surgery

## 2023-01-24 DIAGNOSIS — M25611 Stiffness of right shoulder, not elsewhere classified: Secondary | ICD-10-CM | POA: Insufficient documentation

## 2023-01-24 DIAGNOSIS — M25511 Pain in right shoulder: Secondary | ICD-10-CM | POA: Insufficient documentation

## 2023-01-24 DIAGNOSIS — M6281 Muscle weakness (generalized): Secondary | ICD-10-CM | POA: Diagnosis not present

## 2023-01-24 NOTE — Therapy (Signed)
 OUTPATIENT PHYSICAL TREATMENT NOTE   Patient Name: Kelly Grant MRN: [id number] DOB:1965-03-16, 58 y.o., female Today's Date: 01/24/2023     END OF SESSION:  PT End of Session - 01/24/23 1211     Visit Number 18    Number of Visits 22    Date for PT Re-Evaluation 02/08/23    Authorization Type [employer]    Authorization Time Period Approved 6 visits 01/21/23-03/21/23    Authorization - Visit Number 2    Authorization - Number of Visits 6    PT Start Time 1215    PT Stop Time 1255    PT Time Calculation (min) 40 min    Activity Tolerance Patient tolerated treatment well;No increased pain    Behavior During Therapy [facility] for tasks assessed/performed            . Past Medical History:  Diagnosis Date   Abnormal Pap smear    ASC-H   Arthritis    Chlamydia    History of chicken pox    HSV infection    Multiple sclerosis (HCC)    Neuromuscular disorder (HCC)    MS   Yeast infection    Past Surgical History:  Procedure Laterality Date   CARPAL TUNNEL RELEASE Left    in addition to wrist fracture surgery   CESAREAN SECTION     x2   COLONOSCOPY  2016   DJ-MAC-movi(exc)-TA   INCISION AND DRAINAGE ABSCESS Left    left buttock   POLYPECTOMY  2016   TA   REVERSE SHOULDER ARTHROPLASTY Right 10/30/2022   Procedure: REVERSE SHOULDER ARTHROPLASTY;  Surgeon: Josefina Chew, MD;  Location: [facility];  Service: Orthopedics;  Laterality: Right;   TUBAL LIGATION     Patient Active Problem List   Diagnosis Date Noted   Diabetes mellitus screening 10/01/2022   ASCUS with positive high risk HPV cervical 05/07/2022   Hyperlipidemia 09/29/2021   Encounter for therapeutic drug monitoring 12/13/2014   Multiple sclerosis (HCC) 05/08/2012   HSV infection 06/22/2011    PCP: Oley Bascom RAMAN, NP  REFERRING PROVIDER: Josefina Chew, MD  REFERRING DIAG: S/P Right Reverse Total Shoulder Arthroplasty DOS 10/30/2022  THERAPY DIAG:  Right shoulder pain, unspecified chronicity  Muscle  weakness (generalized)  Stiffness of right shoulder, not elsewhere classified  Rationale for Evaluation and Treatment: Rehabilitation  ONSET DATE: s/p rTSA 10/30/22  SUBJECTIVE:                                                                                                                                                                                     Per eval - Pt reports ongoing shoulder pain since August 2024 which  led to rTSA on 10/30/22. Since surgery has been using sling most of the time although she states at recent follow up with surgeon she was cleared to be out of it while sitting. Has been doing elbow/hand exercises. States she is able to do majority of self care tasks without assist but does have increased difficulty and is avoiding liting/heavier tasks as expected post op.  Hand dominance: Right  SUBJECTIVE STATEMENT: No pain at present, no issues after last session, no other new updates. HEP going well.     PERTINENT HISTORY: MS, rSTA 10/30/22  PAIN:  Are you having pain: no pain Worst: 3/10, aching in lateral shoulder Agg: movement  Per eval -  Location/description: lateral/superior shoulder R  Best-worst over past week: 0-5/10, calms quickly   - aggravating factors: general activity, difficulty describing otherwise - Easing factors: rest, sling, medication  PRECAUTIONS: R shoulder reverse TSA 10 weeks: 01/08/23 12 weeks: 01/22/23  WEIGHT BEARING RESTRICTIONS: Yes assumed NWB on surgical limb  FALLS:  Has patient fallen in last 6 months? No  LIVING ENVIRONMENT: Third floor apartment with three flights of stairs 46 year old son lives with her  OCCUPATION: Working prior to surgery - operator/lead on packing line at [employer]; lots of time up on feet, has to lift fairly heavy items at times   PLOF: Independent  PATIENT GOALS:  be able to use shoulder more again  NEXT MD VISIT: January 2025  OBJECTIVE:  Note: Objective measures were completed at  Evaluation unless otherwise noted.  DIAGNOSTIC FINDINGS:  S/p rTSA 10/30/22  PATIENT SURVEYS:  FOTO 34 current, 63 predicted 12/11/22 FOTO 50 12/28/22: FOTO 50 01/17/23 FOTO 54  COGNITION: Overall cognitive status: Within functional limits for tasks assessed     SENSATION: No sensory complaints, not formally tested  POSTURE: Guarded posture RUE as expected, mild forward head and increased thoracic kyphosis  UPPER EXTREMITY ROM:  A/PROM Right eval Left eval Right 11/27/22 Right 11/30/22 Right 12/28/22 R 01/10/23 R 01/15/23 01/17/23   Shoulder flexion P: ~60 deg limited by muscle guarding   P: ~80 deg P: 96 deg painless with towel slides at table A: 88 deg (more scaption due to ER deficits) P: 121 deg  A: 101 deg stiff w/ shoulder hike, no pain (scaption rather than pure flexion) P: 133 deg  A: 95 deg limited by stiffness rather than pain A: seated 96 deg s, pre session  A: supine 131 deg  A: post session seated  106 deg with mild hike   P: 136 deg s  Shoulder abduction P: ~70 deg  P: ~80 deg  P: 98 deg without pain P: 108 deg without pain, stiff end feel P: 100 deg   Shoulder internal rotation NT         Shoulder external rotation NT    P: neutral without pain, limited by stiffness P: 3 deg with stiff end feel P: just past neutral, stiff P: 6 deg, painless but firm end feel  Elbow flexion          Elbow extension          Wrist flexion          Wrist extension           (Blank rows = not tested) (Key: WFL = within functional limits not formally assessed, * = concordant pain, s = stiffness/stretching sensation, NT = not tested)  Comments: pt denies pain with ROM  UPPER EXTREMITY MMT:  MMT Right eval Left  eval  Shoulder flexion    Shoulder extension    Shoulder abduction    Shoulder extension    Shoulder internal rotation    Shoulder external rotation    Elbow flexion    Elbow extension    Grip strength    (Blank rows = not tested)  (Key: WFL = within functional  limits not formally assessed, * = concordant pain, s = stiffness/stretching sensation, NT = not tested)  Comments: deferred on eval given acuity of surgery  SHOULDER SPECIAL TESTS: Deferred given acuity of surgery  JOINT MOBILITY TESTING:  Deferred given acuity of surgery  PALPATION:  Deferred - visual inspection of incision covered in steristrips, no apparent erythema or swelling, grossly WNL   TODAY'S TREATMENT:   [facility] Adult PT Treatment:                                                DATE: 01/24/23 Therapeutic Exercise: Supine chest press 1# DB 2x8 cues for pacing and form Supine shoulder flexion RUE 2x8 unresisted Supine shoulder flexion 1# DB, short lever up to 90deg 2x5 Green band row 2x8 Supine elbow flexion/extension 1# 2x8 Manual Therapy: PROM to pt tolerance flex/abd/scaption/ER, increased time spent   [facility] Adult PT Treatment:                                                DATE: 01/22/23 Therapeutic Exercise: Supine chest press 1# DB 2x8 cues for pacing and form Supine shoulder flexion RUE x12 unresisted Supine shoulder flexion 1# DB, short lever up to 90deg 2x5 Green band row 2x8   Manual Therapy: PROM to pt tolerance flex/abd/scaption/ER, increased time spent    [facility] Adult PT Treatment:                                                DATE: 01/17/23 Therapeutic Exercise: Supine punch + OH pullover x12 Supine OH pullover w/ dowel x12 Supine flexion AROM RUE x8 Supine ER AROM w/ towel support for appropriate positioning x10 cues for form/alignment  Red band row 2x15 cues for elbow positioning Yellow band shoulder extension x8 cues for posture, periscapular focus, appropriate ROM UE ranger flexion, standing x8  Manual Therapy: Supine PROM to pt tolerance flex/abd/scap/ER   Therapeutic Activity: MSK assessent + education FOTO + education Goals discussion/education, symptom behavior as it affects activity tolerance     PATIENT EDUCATION: Education  details: rationale for interventions, HEP Person educated: Patient Education method: Explanation, Demonstration, Tactile cues, Verbal cues Education comprehension: verbalized understanding, returned demonstration, verbal cues required, tactile cues required  HOME EXERCISE PROGRAM: Access Code: [email or web address]   ASSESSMENT:  CLINICAL IMPRESSION: Patient presents to PT reporting no current pain. Attempted STM to pec and bicep area, but patient remains tender in these areas to the touch. Continued to focus on PROM, especially into ER, as well as gentle strengthening and AROM. Patient continues to benefit from skilled PT services and should be progressed as able to improve functional independence.    Per eval - Patient is a 58 y.o. woman who was seen today for  physical therapy evaluation and treatment for R shoulder pain s/p rTSA 10/30/22. Pt endorses difficulty with daily activities and self care as expected postoperatively. Exam limited to PROM today given proximity to surgery, measurements as above. Initiation of HEP with emphasis on appropriate mechanics within pt precautions, does well with avoiding shoulder extension with scapular retraction. Tolerates session quite well overall. No adverse events, pt reports no pain on departure. Recommend skilled PT to address aforementioned deficits with aim of improving functional tolerance and reducing pain with typical activities. Pt departs today's session in no acute distress, all voiced concerns/questions addressed appropriately from PT perspective.      OBJECTIVE IMPAIRMENTS: decreased activity tolerance, decreased endurance, decreased mobility, decreased ROM, decreased strength, impaired UE functional use, postural dysfunction, and pain.   ACTIVITY LIMITATIONS: carrying, lifting, sleeping, bathing, reach over head, and hygiene/grooming  PARTICIPATION LIMITATIONS: meal prep, cleaning, laundry, driving, community activity, and occupation  PERSONAL FACTORS:  Time since onset of injury/illness/exacerbation and 1 comorbidity: MS  are also affecting patient's functional outcome.   REHAB POTENTIAL: Good  CLINICAL DECISION MAKING: Stable/uncomplicated  EVALUATION COMPLEXITY: Low   GOALS: Goals reviewed with patient? Yes  SHORT TERM GOALS: Target date: 12/19/2022 Pt will demonstrate appropriate understanding and performance of initially prescribed HEP in order to facilitate improved independence with management of symptoms.  Baseline: HEP provided on eval 12/28/22: reports HEP going well Goal status: MET  2. Pt will score greater than or equal to 49 on FOTO in order to demonstrate improved perception of function due to symptoms.  Baseline: 34 12/28/22: 50 01/17/23: 54 Goal status: MET  LONG TERM GOALS: Target date: 02/08/2023 (updated 12/28/22) Pt will score 63 on FOTO in order to demonstrate improved perception of function due to symptoms. Baseline: 34 12/28/22: 50  01/17/23: 54 Goal status: PROGRESSING  2.  Pt will demonstrate at least 140 degrees of active shoulder elevation in order to demonstrate improved tolerance to functional movement patterns such as overhead reaching and upper body dressing.  Baseline: see ROM chart above - PROM only on eval 12/28/22: see ROM chart above 01/17/23: see ROM chart above  Goal status: PROGRESSING  3.  Pt will demonstrate at least 4+/5 shoulder flex/abduction MMT for improved symmetry of UE strength and improved tolerance to functional movements.  Baseline: deferred on eval given proximity to surgery 01/17/23: deferred given mobility deficits and post op protocol Goal status: ONGOING  4. Pt will report/demonstrate ability to perform upper body dressing with less than 2 point increase in pain on NPS in order to indicate improved tolerance/independence with ADLs.  Baseline: increased difficulty/modification required  12/28/22: able to perform w/o pain but does require modification/effort  01/17/23:  able to perform dressing tasks without modification  Goal status: MET  5. Pt will report at least 50% decrease in overall pain levels in past week in order to facilitate improved tolerance to basic ADLs/mobility.   Baseline: 0-5/10  12/28/22: 0-4/10  01/17/23: 0-3/10  Goal status: ONGOING  6. Pt will demonstrate appropriate performance of final prescribed HEP in order to facilitate improved self-management of symptoms post-discharge.   Baseline: initial HEP prescribed  01/17/23: HEP going well per pt report  Goal status: ONGOING  PLAN: UPDATED 12/28/22  PT FREQUENCY: 2x/week  PT DURATION: 6 weeks  PLANNED INTERVENTIONS: 97164- PT Re-evaluation, 97110-Therapeutic exercises, 97530- Therapeutic activity, 97112- Neuromuscular re-education, 97535- Self Care, [id number]- Manual therapy, Patient/Family education, Balance training, Taping, Dry Needling, Joint mobilization, Spinal mobilization, Scar mobilization, Cryotherapy, and Moist heat  PLAN  FOR NEXT SESSION: Review/update HEP PRN. Symptom modification strategies as indicated/appropriate. No extension/IR.   Beverley Millman reverse TSA protocol: Phase V (12 wks-16 wks): Progress with strength (visits as needed) Weight bearing: Advance to tolerance. ROM: No restriction. Begin light stretching or Extension and Internal Rotation,  adjusting progression based on tolerance. Progress end range stretching in all  other planes.  Exercises: Progress slowly with strengthening into IR / Ext past neutral. All  other motions advance as tolerated with bands and light weights (1-5 lbs) with 8- 12 reps x 2-3 sets for cuff, deltoid, scapular stabilizers. Strengthening to be  performed every 2-3 days at home. Never consecutive days. Start light gym  weight training activities.   Corean Pouch PTA 01/24/2023 12:55 PM

## 2023-01-28 ENCOUNTER — Other Ambulatory Visit: Payer: Self-pay

## 2023-01-28 ENCOUNTER — Other Ambulatory Visit (HOSPITAL_COMMUNITY): Payer: Self-pay

## 2023-01-28 NOTE — Therapy (Signed)
 OUTPATIENT PHYSICAL TREATMENT NOTE   Patient Name: Kelly Grant MRN: [id number] DOB:[birth year], 58 y.o., female Today's Date: 01/29/2023     END OF SESSION:  PT End of Session - 01/29/23 1149     Visit Number 19    Number of Visits 22    Date for PT Re-Evaluation 02/08/23    Authorization Type [employer]    Authorization Time Period Approved 6 visits 01/21/23-03/21/23    Authorization - Visit Number 3    Authorization - Number of Visits 6    PT Start Time 1150   late check in   PT Stop Time 1230    PT Time Calculation (min) 40 min    Activity Tolerance Patient tolerated treatment well;No increased pain             . Past Medical History:  Diagnosis Date   Abnormal Pap smear    ASC-H   Arthritis    Chlamydia    History of chicken pox    HSV infection    Multiple sclerosis (HCC)    Neuromuscular disorder (HCC)    MS   Yeast infection    Past Surgical History:  Procedure Laterality Date   CARPAL TUNNEL RELEASE Left    in addition to wrist fracture surgery   CESAREAN SECTION     x2   COLONOSCOPY  2016   DJ-MAC-movi(exc)-TA   INCISION AND DRAINAGE ABSCESS Left    left buttock   POLYPECTOMY  2016   TA   REVERSE SHOULDER ARTHROPLASTY Right 10/30/2022   Procedure: REVERSE SHOULDER ARTHROPLASTY;  Surgeon: Josefina Chew, MD;  Location: [facility];  Service: Orthopedics;  Laterality: Right;   TUBAL LIGATION     Patient Active Problem List   Diagnosis Date Noted   Diabetes mellitus screening 10/01/2022   ASCUS with positive high risk HPV cervical 05/07/2022   Hyperlipidemia 09/29/2021   Encounter for therapeutic drug monitoring 12/13/2014   Multiple sclerosis (HCC) 05/08/2012   HSV infection 06/22/2011    PCP: Oley Bascom RAMAN, NP  REFERRING PROVIDER: Josefina Chew, MD  REFERRING DIAG: S/P Right Reverse Total Shoulder Arthroplasty DOS 10/30/2022  THERAPY DIAG:  Right shoulder pain, unspecified chronicity  Muscle weakness (generalized)  Stiffness of right  shoulder, not elsewhere classified  Rationale for Evaluation and Treatment: Rehabilitation  ONSET DATE: s/p rTSA 10/30/22  SUBJECTIVE:                                                                                                                                                                                     Per eval - Pt reports ongoing shoulder pain since August 2024 which led to rTSA on 10/30/22.  Since surgery has been using sling most of the time although she states at recent follow up with surgeon she was cleared to be out of it while sitting. Has been doing elbow/hand exercises. States she is able to do majority of self care tasks without assist but does have increased difficulty and is avoiding liting/heavier tasks as expected post op.  Hand dominance: Right  SUBJECTIVE STATEMENT: No pain at present, no issues after last session. No other new updates    PERTINENT HISTORY: MS, rSTA 10/30/22  PAIN:  Are you having pain: no pain   Worst: 3/10, aching in lateral shoulder Agg: movement  Per eval -  Location/description: lateral/superior shoulder R  Best-worst over past week: 0-5/10, calms quickly   - aggravating factors: general activity, difficulty describing otherwise - Easing factors: rest, sling, medication  PRECAUTIONS: R shoulder reverse TSA 10 weeks: 01/08/23 12 weeks: 01/22/23  WEIGHT BEARING RESTRICTIONS: Yes assumed NWB on surgical limb  FALLS:  Has patient fallen in last 6 months? No  LIVING ENVIRONMENT: Third floor apartment with three flights of stairs 80 year old son lives with her  OCCUPATION: Working prior to surgery - operator/lead on packing line at [employer]; lots of time up on feet, has to lift fairly heavy items at times   PLOF: Independent  PATIENT GOALS:  be able to use shoulder more again  NEXT MD VISIT: January 2025  OBJECTIVE:  Note: Objective measures were completed at Evaluation unless otherwise noted.  DIAGNOSTIC FINDINGS:  S/p rTSA  10/30/22  PATIENT SURVEYS:  FOTO 34 current, 63 predicted 12/11/22 FOTO 50 12/28/22: FOTO 50 01/17/23 FOTO 54  COGNITION: Overall cognitive status: Within functional limits for tasks assessed     SENSATION: No sensory complaints, not formally tested  POSTURE: Guarded posture RUE as expected, mild forward head and increased thoracic kyphosis  UPPER EXTREMITY ROM:  A/PROM Right eval Left eval Right 11/27/22 Right 11/30/22 Right 12/28/22 R 01/10/23 R 01/15/23 01/17/23   Shoulder flexion P: ~60 deg limited by muscle guarding   P: ~80 deg P: 96 deg painless with towel slides at table A: 88 deg (more scaption due to ER deficits) P: 121 deg  A: 101 deg stiff w/ shoulder hike, no pain (scaption rather than pure flexion) P: 133 deg  A: 95 deg limited by stiffness rather than pain A: seated 96 deg s, pre session  A: supine 131 deg  A: post session seated  106 deg with mild hike   P: 136 deg s  Shoulder abduction P: ~70 deg  P: ~80 deg  P: 98 deg without pain P: 108 deg without pain, stiff end feel P: 100 deg   Shoulder internal rotation NT         Shoulder external rotation NT    P: neutral without pain, limited by stiffness P: 3 deg with stiff end feel P: just past neutral, stiff P: 6 deg, painless but firm end feel  Elbow flexion          Elbow extension          Wrist flexion          Wrist extension           (Blank rows = not tested) (Key: WFL = within functional limits not formally assessed, * = concordant pain, s = stiffness/stretching sensation, NT = not tested)  Comments: pt denies pain with ROM  UPPER EXTREMITY MMT:  MMT Right eval Left eval  Shoulder flexion  Shoulder extension    Shoulder abduction    Shoulder extension    Shoulder internal rotation    Shoulder external rotation    Elbow flexion    Elbow extension    Grip strength    (Blank rows = not tested)  (Key: WFL = within functional limits not formally assessed, * = concordant pain, s =  stiffness/stretching sensation, NT = not tested)  Comments: deferred on eval given acuity of surgery  SHOULDER SPECIAL TESTS: Deferred given acuity of surgery  JOINT MOBILITY TESTING:  Deferred given acuity of surgery  PALPATION:  Deferred - visual inspection of incision covered in steristrips, no apparent erythema or swelling, grossly WNL   TODAY'S TREATMENT:   [facility] Adult PT Treatment:                                                DATE: 01/29/23 Therapeutic Exercise: Supine shoulder flexion AROM 2x12 Supine chest press 1# 3x8 Supine shoulder flex long lever w/ 1# DB 2x5 cues for reduced elbow compensations Elbow flex/ext 1# supine w/ elbow propped 3x10 Blue band standing row 2x8 cues for appropriate mechanics and pacing Finger ladder x8 cues for reduced shrug, (personal best 22) HEP discussion/education  Manual Therapy: PROM shoulder flex/abd/scaption/ER to pt tolerance, multiplanar    [facility] Adult PT Treatment:                                                DATE: 01/24/23 Therapeutic Exercise: Supine chest press 1# DB 2x8 cues for pacing and form Supine shoulder flexion RUE 2x8 unresisted Supine shoulder flexion 1# DB, short lever up to 90deg 2x5 Green band row 2x8 Supine elbow flexion/extension 1# 2x8 Manual Therapy: PROM to pt tolerance flex/abd/scaption/ER, increased time spent   [facility] Adult PT Treatment:                                                DATE: 01/22/23 Therapeutic Exercise: Supine chest press 1# DB 2x8 cues for pacing and form Supine shoulder flexion RUE x12 unresisted Supine shoulder flexion 1# DB, short lever up to 90deg 2x5 Green band row 2x8   Manual Therapy: PROM to pt tolerance flex/abd/scaption/ER, increased time spent    [facility] Adult PT Treatment:                                                DATE: 01/17/23 Therapeutic Exercise: Supine punch + OH pullover x12 Supine OH pullover w/ dowel x12 Supine flexion AROM RUE x8 Supine ER AROM w/ towel  support for appropriate positioning x10 cues for form/alignment  Red band row 2x15 cues for elbow positioning Yellow band shoulder extension x8 cues for posture, periscapular focus, appropriate ROM UE ranger flexion, standing x8  Manual Therapy: Supine PROM to pt tolerance flex/abd/scap/ER   Therapeutic Activity: MSK assessent + education FOTO + education Goals discussion/education, symptom behavior as it affects activity tolerance     PATIENT EDUCATION: Education  details: rationale for interventions, HEP Person educated: Patient Education method: Explanation, Demonstration, Tactile cues, Verbal cues Education comprehension: verbalized understanding, returned demonstration, verbal cues required, tactile cues required  HOME EXERCISE PROGRAM: Access Code: [email or web address]   ASSESSMENT:  CLINICAL IMPRESSION: 01/29/2023 Pt arrives w/o resting pain, no issues after last session. Today continuing to work on [facility] mobility and strength in gravity reduced positions, building volume with familiar exercises. Able to progress to long lever shoulder flexion w/ 1# DB without increase in pain. No adverse events, denies any pain on departure. Recommend continuing along current POC in order to address relevant deficits and improve functional tolerance. Pt departs today's session in no acute distress, all voiced questions/concerns addressed appropriately from PT perspective.    Per eval - Patient is a 58 y.o. woman who was seen today for physical therapy evaluation and treatment for R shoulder pain s/p rTSA 10/30/22. Pt endorses difficulty with daily activities and self care as expected postoperatively. Exam limited to PROM today given proximity to surgery, measurements as above. Initiation of HEP with emphasis on appropriate mechanics within pt precautions, does well with avoiding shoulder extension with scapular retraction. Tolerates session quite well overall. No adverse events, pt reports no pain on departure.  Recommend skilled PT to address aforementioned deficits with aim of improving functional tolerance and reducing pain with typical activities. Pt departs today's session in no acute distress, all voiced concerns/questions addressed appropriately from PT perspective.      OBJECTIVE IMPAIRMENTS: decreased activity tolerance, decreased endurance, decreased mobility, decreased ROM, decreased strength, impaired UE functional use, postural dysfunction, and pain.   ACTIVITY LIMITATIONS: carrying, lifting, sleeping, bathing, reach over head, and hygiene/grooming  PARTICIPATION LIMITATIONS: meal prep, cleaning, laundry, driving, community activity, and occupation  PERSONAL FACTORS: Time since onset of injury/illness/exacerbation and 1 comorbidity: MS  are also affecting patient's functional outcome.   REHAB POTENTIAL: Good  CLINICAL DECISION MAKING: Stable/uncomplicated  EVALUATION COMPLEXITY: Low   GOALS: Goals reviewed with patient? Yes  SHORT TERM GOALS: Target date: 12/19/2022 Pt will demonstrate appropriate understanding and performance of initially prescribed HEP in order to facilitate improved independence with management of symptoms.  Baseline: HEP provided on eval 12/28/22: reports HEP going well Goal status: MET  2. Pt will score greater than or equal to 49 on FOTO in order to demonstrate improved perception of function due to symptoms.  Baseline: 34 12/28/22: 50 01/17/23: 54 Goal status: MET  LONG TERM GOALS: Target date: 02/08/2023 (updated 12/28/22) Pt will score 63 on FOTO in order to demonstrate improved perception of function due to symptoms. Baseline: 34 12/28/22: 50  01/17/23: 54 Goal status: PROGRESSING  2.  Pt will demonstrate at least 140 degrees of active shoulder elevation in order to demonstrate improved tolerance to functional movement patterns such as overhead reaching and upper body dressing.  Baseline: see ROM chart above - PROM only on eval 12/28/22: see ROM  chart above 01/17/23: see ROM chart above  Goal status: PROGRESSING  3.  Pt will demonstrate at least 4+/5 shoulder flex/abduction MMT for improved symmetry of UE strength and improved tolerance to functional movements.  Baseline: deferred on eval given proximity to surgery 01/17/23: deferred given mobility deficits and post op protocol Goal status: ONGOING  4. Pt will report/demonstrate ability to perform upper body dressing with less than 2 point increase in pain on NPS in order to indicate improved tolerance/independence with ADLs.  Baseline: increased difficulty/modification required  12/28/22: able to perform w/o pain but does require modification/effort  01/17/23: able to perform dressing tasks without modification  Goal status: MET  5. Pt will report at least 50% decrease in overall pain levels in past week in order to facilitate improved tolerance to basic ADLs/mobility.   Baseline: 0-5/10  12/28/22: 0-4/10  01/17/23: 0-3/10  Goal status: ONGOING  6. Pt will demonstrate appropriate performance of final prescribed HEP in order to facilitate improved self-management of symptoms post-discharge.   Baseline: initial HEP prescribed  01/17/23: HEP going well per pt report  Goal status: ONGOING  PLAN: UPDATED 12/28/22  PT FREQUENCY: 2x/week  PT DURATION: 6 weeks  PLANNED INTERVENTIONS: 97164- PT Re-evaluation, 97110-Therapeutic exercises, 97530- Therapeutic activity, 97112- Neuromuscular re-education, 97535- Self Care, [id number]- Manual therapy, Patient/Family education, Balance training, Taping, Dry Needling, Joint mobilization, Spinal mobilization, Scar mobilization, Cryotherapy, and Moist heat  PLAN FOR NEXT SESSION: Review/update HEP PRN. Symptom modification strategies as indicated/appropriate. No extension/IR.   Beverley Millman reverse TSA protocol: Phase V (12 wks-16 wks): Progress with strength (visits as needed) Weight bearing: Advance to tolerance. ROM: No restriction. Begin  light stretching or Extension and Internal Rotation,  adjusting progression based on tolerance. Progress end range stretching in all  other planes.  Exercises: Progress slowly with strengthening into IR / Ext past neutral. All  other motions advance as tolerated with bands and light weights (1-5 lbs) with 8- 12 reps x 2-3 sets for cuff, deltoid, scapular stabilizers. Strengthening to be  performed every 2-3 days at home. Never consecutive days. Start light gym  weight training activities.   Alm DELENA Jenny PT, DPT 01/29/2023 12:31 PM

## 2023-01-29 ENCOUNTER — Ambulatory Visit: Payer: Medicaid Other | Admitting: Physical Therapy

## 2023-01-29 ENCOUNTER — Encounter: Payer: Self-pay | Admitting: Physical Therapy

## 2023-01-29 DIAGNOSIS — M6281 Muscle weakness (generalized): Secondary | ICD-10-CM | POA: Diagnosis not present

## 2023-01-29 DIAGNOSIS — M25611 Stiffness of right shoulder, not elsewhere classified: Secondary | ICD-10-CM

## 2023-01-29 DIAGNOSIS — M25511 Pain in right shoulder: Secondary | ICD-10-CM

## 2023-01-30 NOTE — Therapy (Signed)
 OUTPATIENT PHYSICAL TREATMENT NOTE   Patient Name: Kelly Grant MRN: [id number] DOB:11-06-1965, 58 y.o., female Today's Date: 01/31/2023     END OF SESSION:  PT End of Session - 01/31/23 1318     Visit Number 20    Number of Visits 22    Date for PT Re-Evaluation 02/08/23    Authorization Type [employer]    Authorization Time Period Approved 6 visits 01/21/23-03/21/23    Authorization - Visit Number 4    Authorization - Number of Visits 6    PT Start Time 1318    PT Stop Time 1359    PT Time Calculation (min) 41 min    Activity Tolerance Patient tolerated treatment well;No increased pain              . Past Medical History:  Diagnosis Date   Abnormal Pap smear    ASC-H   Arthritis    Chlamydia    History of chicken pox    HSV infection    Multiple sclerosis (HCC)    Neuromuscular disorder (HCC)    MS   Yeast infection    Past Surgical History:  Procedure Laterality Date   CARPAL TUNNEL RELEASE Left    in addition to wrist fracture surgery   CESAREAN SECTION     x2   COLONOSCOPY  2016   DJ-MAC-movi(exc)-TA   INCISION AND DRAINAGE ABSCESS Left    left buttock   POLYPECTOMY  2016   TA   REVERSE SHOULDER ARTHROPLASTY Right 10/30/2022   Procedure: REVERSE SHOULDER ARTHROPLASTY;  Surgeon: Josefina Chew, MD;  Location: [facility];  Service: Orthopedics;  Laterality: Right;   TUBAL LIGATION     Patient Active Problem List   Diagnosis Date Noted   Diabetes mellitus screening 10/01/2022   ASCUS with positive high risk HPV cervical 05/07/2022   Hyperlipidemia 09/29/2021   Encounter for therapeutic drug monitoring 12/13/2014   Multiple sclerosis (HCC) 05/08/2012   HSV infection 06/22/2011    PCP: Oley Bascom RAMAN, NP  REFERRING PROVIDER: Josefina Chew, MD  REFERRING DIAG: S/P Right Reverse Total Shoulder Arthroplasty DOS 10/30/2022  THERAPY DIAG:  Right shoulder pain, unspecified chronicity  Muscle weakness (generalized)  Stiffness of right shoulder, not  elsewhere classified  Rationale for Evaluation and Treatment: Rehabilitation  ONSET DATE: s/p rTSA 10/30/22  SUBJECTIVE:                                                                                                                                                                                     Per eval - Pt reports ongoing shoulder pain since August 2024 which led to rTSA on 10/30/22. Since surgery has  been using sling most of the time although she states at recent follow up with surgeon she was cleared to be out of it while sitting. Has been doing elbow/hand exercises. States she is able to do majority of self care tasks without assist but does have increased difficulty and is avoiding liting/heavier tasks as expected post op.  Hand dominance: Right  SUBJECTIVE STATEMENT: No pain at present, no issues after last session. No other new updates.     PERTINENT HISTORY: MS, rSTA 10/30/22  PAIN:  Are you having pain: no pain   Worst: 3/10, aching in lateral shoulder Agg: movement  Per eval -  Location/description: lateral/superior shoulder R  Best-worst over past week: 0-5/10, calms quickly   - aggravating factors: general activity, difficulty describing otherwise - Easing factors: rest, sling, medication  PRECAUTIONS: R shoulder reverse TSA 10 weeks: 01/08/23 12 weeks: 01/22/23  WEIGHT BEARING RESTRICTIONS: Yes assumed NWB on surgical limb  FALLS:  Has patient fallen in last 6 months? No  LIVING ENVIRONMENT: Third floor apartment with three flights of stairs 91 year old son lives with her  OCCUPATION: Working prior to surgery - operator/lead on packing line at [employer]; lots of time up on feet, has to lift fairly heavy items at times   PLOF: Independent  PATIENT GOALS:  be able to use shoulder more again  NEXT MD VISIT: January 2025  OBJECTIVE:  Note: Objective measures were completed at Evaluation unless otherwise noted.  DIAGNOSTIC FINDINGS:  S/p rTSA  10/30/22  PATIENT SURVEYS:  FOTO 34 current, 63 predicted 12/11/22 FOTO 50 12/28/22: FOTO 50 01/17/23 FOTO 54  COGNITION: Overall cognitive status: Within functional limits for tasks assessed     SENSATION: No sensory complaints, not formally tested  POSTURE: Guarded posture RUE as expected, mild forward head and increased thoracic kyphosis  UPPER EXTREMITY ROM:  A/PROM Right eval Left eval Right 11/27/22 Right 11/30/22 Right 12/28/22 R 01/10/23 R 01/15/23 01/17/23   Shoulder flexion P: ~60 deg limited by muscle guarding   P: ~80 deg P: 96 deg painless with towel slides at table A: 88 deg (more scaption due to ER deficits) P: 121 deg  A: 101 deg stiff w/ shoulder hike, no pain (scaption rather than pure flexion) P: 133 deg  A: 95 deg limited by stiffness rather than pain A: seated 96 deg s, pre session  A: supine 131 deg  A: post session seated  106 deg with mild hike   P: 136 deg s  Shoulder abduction P: ~70 deg  P: ~80 deg  P: 98 deg without pain P: 108 deg without pain, stiff end feel P: 100 deg   Shoulder internal rotation NT         Shoulder external rotation NT    P: neutral without pain, limited by stiffness P: 3 deg with stiff end feel P: just past neutral, stiff P: 6 deg, painless but firm end feel  Elbow flexion          Elbow extension          Wrist flexion          Wrist extension           (Blank rows = not tested) (Key: WFL = within functional limits not formally assessed, * = concordant pain, s = stiffness/stretching sensation, NT = not tested)  Comments: pt denies pain with ROM  UPPER EXTREMITY MMT:  MMT Right eval Left eval  Shoulder flexion    Shoulder extension  Shoulder abduction    Shoulder extension    Shoulder internal rotation    Shoulder external rotation    Elbow flexion    Elbow extension    Grip strength    (Blank rows = not tested)  (Key: WFL = within functional limits not formally assessed, * = concordant pain, s =  stiffness/stretching sensation, NT = not tested)  Comments: deferred on eval given acuity of surgery  SHOULDER SPECIAL TESTS: Deferred given acuity of surgery  JOINT MOBILITY TESTING:  Deferred given acuity of surgery  PALPATION:  Deferred - visual inspection of incision covered in steristrips, no apparent erythema or swelling, grossly WNL   TODAY'S TREATMENT:   [facility] Adult PT Treatment:                                                DATE: 01/31/23 Therapeutic Exercise: Supine chest press 2# 2x5 1# DB supine shoulder flexion 2x12  2# elbow flex, supine w/ elbow prop 2x8  Wall slides x10 cues for reduced shrug Blue band row 2x10 cues for posture  HEP update + education/handout   Manual Therapy: PROM scap/flex/abd/ER to pt tolerance, ER at varying levels of abduction (up to about 40 deg)  3x30sec rhythmic stabilizations long lever low amplitude    [facility] Adult PT Treatment:                                                DATE: 01/29/23 Therapeutic Exercise: Supine shoulder flexion AROM 2x12 Supine chest press 1# 3x8 Supine shoulder flex long lever w/ 1# DB 2x5 cues for reduced elbow compensations Elbow flex/ext 1# supine w/ elbow propped 3x10 Blue band standing row 2x8 cues for appropriate mechanics and pacing Finger ladder x8 cues for reduced shrug, (personal best 22) HEP discussion/education  Manual Therapy: PROM shoulder flex/abd/scaption/ER to pt tolerance, multiplanar    [facility] Adult PT Treatment:                                                DATE: 01/24/23 Therapeutic Exercise: Supine chest press 1# DB 2x8 cues for pacing and form Supine shoulder flexion RUE 2x8 unresisted Supine shoulder flexion 1# DB, short lever up to 90deg 2x5 Green band row 2x8 Supine elbow flexion/extension 1# 2x8 Manual Therapy: PROM to pt tolerance flex/abd/scaption/ER, increased time spent    PATIENT EDUCATION: Education details: rationale for interventions, HEP Person educated:  Patient Education method: Explanation, Demonstration, Tactile cues, Verbal cues Education comprehension: verbalized understanding, returned demonstration, verbal cues required, tactile cues required  HOME EXERCISE PROGRAM: Access Code: [email or web address]   ASSESSMENT:  CLINICAL IMPRESSION: 01/31/2023 Pt arrives w/o resting pain, no issues after last session. Today continuing to work on [facility] strengthening in gravity reduced positions which pt does well with. Also adding in rhythmic stabilizations which pt does well with, no increase in pain. No adverse events, pt limited primarily by stiffness and fatigue. HEP update as above. Recommend continuing along current POC in order to address relevant deficits and improve functional tolerance. Pt departs today's session in no acute distress, all voiced questions/concerns addressed appropriately  from PT perspective.    Per eval - Patient is a 58 y.o. woman who was seen today for physical therapy evaluation and treatment for R shoulder pain s/p rTSA 10/30/22. Pt endorses difficulty with daily activities and self care as expected postoperatively. Exam limited to PROM today given proximity to surgery, measurements as above. Initiation of HEP with emphasis on appropriate mechanics within pt precautions, does well with avoiding shoulder extension with scapular retraction. Tolerates session quite well overall. No adverse events, pt reports no pain on departure. Recommend skilled PT to address aforementioned deficits with aim of improving functional tolerance and reducing pain with typical activities. Pt departs today's session in no acute distress, all voiced concerns/questions addressed appropriately from PT perspective.      OBJECTIVE IMPAIRMENTS: decreased activity tolerance, decreased endurance, decreased mobility, decreased ROM, decreased strength, impaired UE functional use, postural dysfunction, and pain.   ACTIVITY LIMITATIONS: carrying, lifting, sleeping, bathing, reach  over head, and hygiene/grooming  PARTICIPATION LIMITATIONS: meal prep, cleaning, laundry, driving, community activity, and occupation  PERSONAL FACTORS: Time since onset of injury/illness/exacerbation and 1 comorbidity: MS  are also affecting patient's functional outcome.   REHAB POTENTIAL: Good  CLINICAL DECISION MAKING: Stable/uncomplicated  EVALUATION COMPLEXITY: Low   GOALS: Goals reviewed with patient? Yes  SHORT TERM GOALS: Target date: 12/19/2022 Pt will demonstrate appropriate understanding and performance of initially prescribed HEP in order to facilitate improved independence with management of symptoms.  Baseline: HEP provided on eval 12/28/22: reports HEP going well Goal status: MET  2. Pt will score greater than or equal to 49 on FOTO in order to demonstrate improved perception of function due to symptoms.  Baseline: 34 12/28/22: 50 01/17/23: 54 Goal status: MET  LONG TERM GOALS: Target date: 02/08/2023 (updated 12/28/22) Pt will score 63 on FOTO in order to demonstrate improved perception of function due to symptoms. Baseline: 34 12/28/22: 50  01/17/23: 54 Goal status: PROGRESSING  2.  Pt will demonstrate at least 140 degrees of active shoulder elevation in order to demonstrate improved tolerance to functional movement patterns such as overhead reaching and upper body dressing.  Baseline: see ROM chart above - PROM only on eval 12/28/22: see ROM chart above 01/17/23: see ROM chart above  Goal status: PROGRESSING  3.  Pt will demonstrate at least 4+/5 shoulder flex/abduction MMT for improved symmetry of UE strength and improved tolerance to functional movements.  Baseline: deferred on eval given proximity to surgery 01/17/23: deferred given mobility deficits and post op protocol Goal status: ONGOING  4. Pt will report/demonstrate ability to perform upper body dressing with less than 2 point increase in pain on NPS in order to indicate improved  tolerance/independence with ADLs.  Baseline: increased difficulty/modification required  12/28/22: able to perform w/o pain but does require modification/effort  01/17/23: able to perform dressing tasks without modification  Goal status: MET  5. Pt will report at least 50% decrease in overall pain levels in past week in order to facilitate improved tolerance to basic ADLs/mobility.   Baseline: 0-5/10  12/28/22: 0-4/10  01/17/23: 0-3/10  Goal status: ONGOING  6. Pt will demonstrate appropriate performance of final prescribed HEP in order to facilitate improved self-management of symptoms post-discharge.   Baseline: initial HEP prescribed  01/17/23: HEP going well per pt report  Goal status: ONGOING  PLAN: UPDATED 12/28/22  PT FREQUENCY: 2x/week  PT DURATION: 6 weeks  PLANNED INTERVENTIONS: 97164- PT Re-evaluation, 97110-Therapeutic exercises, 97530- Therapeutic activity, 97112- Neuromuscular re-education, 97535- Self Care, [id number]- Manual  therapy, Patient/Family education, Balance training, Taping, Dry Needling, Joint mobilization, Spinal mobilization, Scar mobilization, Cryotherapy, and Moist heat  PLAN FOR NEXT SESSION: Review/update HEP PRN. Symptom modification strategies as indicated/appropriate. No extension/IR.   [facility] reverse TSA protocol: Phase V (12 wks-16 wks): Progress with strength (visits as needed) Weight bearing: Advance to tolerance. ROM: No restriction. Begin light stretching or Extension and Internal Rotation,  adjusting progression based on tolerance. Progress end range stretching in all  other planes.  Exercises: Progress slowly with strengthening into IR / Ext past neutral. All  other motions advance as tolerated with bands and light weights (1-5 lbs) with 8- 12 reps x 2-3 sets for cuff, deltoid, scapular stabilizers. Strengthening to be  performed every 2-3 days at home. Never consecutive days. Start light gym  weight training activities.   Alm DELENA Jenny PT, DPT 01/31/2023 2:03 PM

## 2023-01-31 ENCOUNTER — Ambulatory Visit: Payer: Medicaid Other | Admitting: Physical Therapy

## 2023-01-31 ENCOUNTER — Encounter: Payer: Self-pay | Admitting: Physical Therapy

## 2023-01-31 DIAGNOSIS — M25611 Stiffness of right shoulder, not elsewhere classified: Secondary | ICD-10-CM | POA: Diagnosis not present

## 2023-01-31 DIAGNOSIS — M25511 Pain in right shoulder: Secondary | ICD-10-CM | POA: Diagnosis not present

## 2023-01-31 DIAGNOSIS — M6281 Muscle weakness (generalized): Secondary | ICD-10-CM

## 2023-02-04 NOTE — Therapy (Signed)
 OUTPATIENT PHYSICAL PROGRESS NOTE + RECERTIFICATION   Patient Name: Kelly Grant MRN: [id number] DOB:25-Jan-1965, 58 y.o., female Today's Date: 02/05/2023   Progress Note Reporting Period 12/28/22 to 02/05/23  See note below for Objective Data and Assessment of Progress/Goals.      END OF SESSION:  PT End of Session - 02/05/23 1152     Visit Number 21    Number of Visits 25    Date for PT Re-Evaluation 03/05/23    Authorization Type [employer]    Authorization Time Period Approved 6 visits 01/21/23-03/21/23    Authorization - Visit Number 5    Authorization - Number of Visits 6    PT Start Time 1153   late check in   PT Stop Time 1229    PT Time Calculation (min) 36 min    Activity Tolerance Patient tolerated treatment well;No increased pain               . Past Medical History:  Diagnosis Date   Abnormal Pap smear    ASC-H   Arthritis    Chlamydia    History of chicken pox    HSV infection    Multiple sclerosis (HCC)    Neuromuscular disorder (HCC)    MS   Yeast infection    Past Surgical History:  Procedure Laterality Date   CARPAL TUNNEL RELEASE Left    in addition to wrist fracture surgery   CESAREAN SECTION     x2   COLONOSCOPY  2016   DJ-MAC-movi(exc)-TA   INCISION AND DRAINAGE ABSCESS Left    left buttock   POLYPECTOMY  2016   TA   REVERSE SHOULDER ARTHROPLASTY Right 10/30/2022   Procedure: REVERSE SHOULDER ARTHROPLASTY;  Surgeon: Josefina Chew, MD;  Location: [facility];  Service: Orthopedics;  Laterality: Right;   TUBAL LIGATION     Patient Active Problem List   Diagnosis Date Noted   Diabetes mellitus screening 10/01/2022   ASCUS with positive high risk HPV cervical 05/07/2022   Hyperlipidemia 09/29/2021   Encounter for therapeutic drug monitoring 12/13/2014   Multiple sclerosis (HCC) 05/08/2012   HSV infection 06/22/2011    PCP: Oley Bascom RAMAN, NP  REFERRING PROVIDER: Josefina Chew, MD  REFERRING DIAG: S/P Right Reverse Total  Shoulder Arthroplasty DOS 10/30/2022  THERAPY DIAG:  Right shoulder pain, unspecified chronicity  Muscle weakness (generalized)  Stiffness of right shoulder, not elsewhere classified  Rationale for Evaluation and Treatment: Rehabilitation  ONSET DATE: s/p rTSA 10/30/22  SUBJECTIVE:  Per eval - Pt reports ongoing shoulder pain since August 2024 which led to rTSA on 10/30/22. Since surgery has been using sling most of the time although she states at recent follow up with surgeon she was cleared to be out of it while sitting. Has been doing elbow/hand exercises. States she is able to do majority of self care tasks without assist but does have increased difficulty and is avoiding liting/heavier tasks as expected post op.  Hand dominance: Right  SUBJECTIVE STATEMENT: Mild muscular soreness after last session that did not last long. No increase in pain, no pain today. Pt continues to endorse limitations in reaching activities, still using LUE for many of her daily tasks.     PERTINENT HISTORY: MS, rSTA 10/30/22  PAIN:  Are you having pain: no pain   Worst: 2-3/10 lateral shoulder Agg: movement  Per eval -  Location/description: lateral/superior shoulder R  Best-worst over past week: 0-5/10, calms quickly   - aggravating factors: general activity, difficulty describing otherwise - Easing factors: rest, sling, medication  PRECAUTIONS: R shoulder reverse TSA 14 weeks 02/05/23 16 weeks 02/19/23  WEIGHT BEARING RESTRICTIONS: Yes assumed NWB on surgical limb  FALLS:  Has patient fallen in last 6 months? No  LIVING ENVIRONMENT: Third floor apartment with three flights of stairs 14 year old son lives with her  OCCUPATION: Working prior to surgery - operator/lead on packing line at [employer]; lots of time up on  feet, has to lift fairly heavy items at times   PLOF: Independent  PATIENT GOALS:  be able to use shoulder more again  NEXT MD VISIT: January 2025  OBJECTIVE:  Note: Objective measures were completed at Evaluation unless otherwise noted.  DIAGNOSTIC FINDINGS:  S/p rTSA 10/30/22  PATIENT SURVEYS:  FOTO 34 current, 63 predicted 12/11/22 FOTO 50 12/28/22: FOTO 50 01/17/23 FOTO 54 02/05/23 FOTO 50   COGNITION: Overall cognitive status: Within functional limits for tasks assessed     SENSATION: No sensory complaints, not formally tested  POSTURE: Guarded posture RUE as expected, mild forward head and increased thoracic kyphosis  UPPER EXTREMITY ROM:  A/PROM Right eval Left eval Right 11/27/22 Right 11/30/22 Right 12/28/22 R 01/10/23 R 01/15/23 01/17/23  02/05/23  Shoulder flexion P: ~60 deg limited by muscle guarding   P: ~80 deg P: 96 deg painless with towel slides at table A: 88 deg (more scaption due to ER deficits) P: 121 deg  A: 101 deg stiff w/ shoulder hike, no pain (scaption rather than pure flexion) P: 133 deg  A: 95 deg limited by stiffness rather than pain A: seated 96 deg s, pre session  A: supine 131 deg  A: post session seated  106 deg with mild hike   P: 136 deg s A: 95 deg seated pre session, painless  A: 120 deg supine  Shoulder abduction P: ~70 deg  P: ~80 deg  P: 98 deg without pain P: 108 deg without pain, stiff end feel P: 100 deg  A: 94 deg s  P: 98 deg  Shoulder internal rotation NT          Shoulder external rotation NT    P: neutral without pain, limited by stiffness P: 3 deg with stiff end feel P: just past neutral, stiff P: 6 deg, painless but firm end feel P: 12-14 deg from 0-45 deg abd painless  Elbow flexion           Elbow extension  Wrist flexion           Wrist extension            (Blank rows = not tested) (Key: WFL = within functional limits not formally assessed, * = concordant pain, s = stiffness/stretching sensation, NT =  not tested)  Comments: pt denies pain with ROM  UPPER EXTREMITY MMT:  MMT Right 02/05/23 Left 02/05/23  Shoulder flexion 3+ painless  4  Shoulder extension    Shoulder abduction 4 submax, painless 4+  Shoulder extension    Shoulder internal rotation    Shoulder external rotation    Elbow flexion    Elbow extension    Grip strength    (Blank rows = not tested)  (Key: WFL = within functional limits not formally assessed, * = concordant pain, s = stiffness/stretching sensation, NT = not tested)  Comments: deferred on eval given acuity of surgery  SHOULDER SPECIAL TESTS: Deferred given acuity of surgery  JOINT MOBILITY TESTING:  Deferred given acuity of surgery  PALPATION:  Deferred - visual inspection of incision covered in steristrips, no apparent erythema or swelling, grossly WNL   TODAY'S TREATMENT:   [facility] Adult PT Treatment:                                                DATE: 02/05/23 Therapeutic Exercise: Supine shoulder flexion 1# DB x12, 2# x5 Supine chest press 2# 2x8 Wall slides x10 cues for reduced shrug HEP education/discussion  Therapeutic Activity: MSK assessment + education FOTO + education Education/discussion re: progress with PT, symptom behavior as it affects activity tolerance, PT goals/POC     PATIENT EDUCATION: Education details: rationale for interventions, HEP, PT goals/POC  Person educated: Patient Education method: Explanation, Demonstration, Tactile cues, Verbal cues Education comprehension: verbalized understanding, returned demonstration, verbal cues required, tactile cues required  HOME EXERCISE PROGRAM: Access Code: [email or web address]   ASSESSMENT:  CLINICAL IMPRESSION: 02/05/2023 Pt is 14 weeks post op and arrives w/o pain, no issues after last session. Looking at ROM pt has hit a bit of plateau with AROM against gravity but continues with discrepancy between against-gravity and gravity-reduced ROM, indicative of weakness. She has made some  fair progress in regards to ER ROM which has been part of our recent focus. Remains relatively stiff overall - anticipate would benefit from additional PT with focus on functional strengthening now that pt is in phase 5 of her surgical protocol. Would like to maximize mobility as well although acknowledging that it has hit plateau last few measurements. Tolerates strength progressions well today in gravity reduced positions without any pain. No adverse events. Recommend continuing along updated POC in order to address relevant deficits and improve functional tolerance. Pt departs today's session in no acute distress, all voiced questions/concerns addressed appropriately from PT perspective.     Per eval - Patient is a 58 y.o. woman who was seen today for physical therapy evaluation and treatment for R shoulder pain s/p rTSA 10/30/22. Pt endorses difficulty with daily activities and self care as expected postoperatively. Exam limited to PROM today given proximity to surgery, measurements as above. Initiation of HEP with emphasis on appropriate mechanics within pt precautions, does well with avoiding shoulder extension with scapular retraction. Tolerates session quite well overall. No adverse events, pt reports no pain on departure. Recommend skilled PT to address aforementioned deficits  with aim of improving functional tolerance and reducing pain with typical activities. Pt departs today's session in no acute distress, all voiced concerns/questions addressed appropriately from PT perspective.      OBJECTIVE IMPAIRMENTS: decreased activity tolerance, decreased endurance, decreased mobility, decreased ROM, decreased strength, impaired UE functional use, postural dysfunction, and pain.   ACTIVITY LIMITATIONS: carrying, lifting, sleeping, bathing, reach over head, and hygiene/grooming  PARTICIPATION LIMITATIONS: meal prep, cleaning, laundry, driving, community activity, and occupation  PERSONAL FACTORS: Time  since onset of injury/illness/exacerbation and 1 comorbidity: MS  are also affecting patient's functional outcome.   REHAB POTENTIAL: Good  CLINICAL DECISION MAKING: Stable/uncomplicated  EVALUATION COMPLEXITY: Low   GOALS: Goals reviewed with patient? Yes  SHORT TERM GOALS: Target date: 12/19/2022 Pt will demonstrate appropriate understanding and performance of initially prescribed HEP in order to facilitate improved independence with management of symptoms.  Baseline: HEP provided on eval 12/28/22: reports HEP going well Goal status: MET  2. Pt will score greater than or equal to 49 on FOTO in order to demonstrate improved perception of function due to symptoms.  Baseline: 34 12/28/22: 50 01/17/23: 54 Goal status: MET  LONG TERM GOALS: Target date: 03/05/2023   (updated 02/05/23) Pt will score 63 on FOTO in order to demonstrate improved perception of function due to symptoms. Baseline: 34 12/28/22: 50  01/17/23: 54 02/05/23: 50 Goal status: ONGOING  2.  Pt will demonstrate at least 140 degrees of active shoulder elevation in order to demonstrate improved tolerance to functional movement patterns such as overhead reaching and upper body dressing.  Baseline: see ROM chart above - PROM only on eval 12/28/22: see ROM chart above 01/17/23: see ROM chart above  02/05/23: see Rom chart above Goal status: ONGOING  3.  Pt will demonstrate at least 4+/5 shoulder flex/abduction MMT for improved symmetry of UE strength and improved tolerance to functional movements.  Baseline: deferred on eval given proximity to surgery 01/17/23: deferred given mobility deficits and post op protocol 02/05/23: post op week 14 - see MMT chart above Goal status: ONGOING  4. Pt will report/demonstrate ability to perform upper body dressing with less than 2 point increase in pain on NPS in order to indicate improved tolerance/independence with ADLs.  Baseline: increased difficulty/modification  required  12/28/22: able to perform w/o pain but does require modification/effort  01/17/23: able to perform dressing tasks without modification  Goal status: MET  5. Pt will report at least 50% decrease in overall pain levels in past week in order to facilitate improved tolerance to basic ADLs/mobility.   Baseline: 0-5/10  12/28/22: 0-4/10  01/17/23: 0-3/10 02/05/23: 0-3/10 Goal status: ONGOING  6. Pt will demonstrate appropriate performance of final prescribed HEP in order to facilitate improved self-management of symptoms post-discharge.   Baseline: initial HEP prescribed  01/17/23: HEP going well per pt report 02/05/23: HEP going well per pt report Goal status: ONGOING  PLAN: UPDATED 02/05/23  PT FREQUENCY: 2x/week  PT DURATION: 6 weeks  PLANNED INTERVENTIONS: 97164- PT Re-evaluation, 97110-Therapeutic exercises, 97530- Therapeutic activity, 97112- Neuromuscular re-education, 97535- Self Care, [id number]- Manual therapy, Patient/Family education, Balance training, Taping, Dry Needling, Joint mobilization, Spinal mobilization, Scar mobilization, Cryotherapy, and Moist heat  PLAN FOR NEXT SESSION: Review/update HEP PRN. Symptom modification strategies as indicated/appropriate. Continue along Beverley Millman protocol as appropriate.   Beverley Millman reverse TSA protocol: Phase V (12 wks-16 wks): Progress with strength (visits as needed) Weight bearing: Advance to tolerance. ROM: No restriction. Begin light stretching or Extension and Internal  Rotation,  adjusting progression based on tolerance. Progress end range stretching in all  other planes.  Exercises: Progress slowly with strengthening into IR / Ext past neutral. All  other motions advance as tolerated with bands and light weights (1-5 lbs) with 8- 12 reps x 2-3 sets for cuff, deltoid, scapular stabilizers. Strengthening to be  performed every 2-3 days at home. Never consecutive days. Start light gym  weight training  activities.   Alm DELENA Jenny PT, DPT 02/05/2023 12:37 PM

## 2023-02-05 ENCOUNTER — Ambulatory Visit: Payer: Medicaid Other | Admitting: Physical Therapy

## 2023-02-05 ENCOUNTER — Encounter: Payer: Self-pay | Admitting: Physical Therapy

## 2023-02-05 DIAGNOSIS — M25611 Stiffness of right shoulder, not elsewhere classified: Secondary | ICD-10-CM

## 2023-02-05 DIAGNOSIS — M25511 Pain in right shoulder: Secondary | ICD-10-CM

## 2023-02-05 DIAGNOSIS — M6281 Muscle weakness (generalized): Secondary | ICD-10-CM

## 2023-02-07 ENCOUNTER — Encounter: Payer: Medicaid Other | Admitting: Physical Therapy

## 2023-02-12 ENCOUNTER — Ambulatory Visit: Payer: Medicaid Other

## 2023-02-12 DIAGNOSIS — M25611 Stiffness of right shoulder, not elsewhere classified: Secondary | ICD-10-CM | POA: Diagnosis not present

## 2023-02-12 DIAGNOSIS — M6281 Muscle weakness (generalized): Secondary | ICD-10-CM | POA: Diagnosis not present

## 2023-02-12 DIAGNOSIS — M25511 Pain in right shoulder: Secondary | ICD-10-CM | POA: Diagnosis not present

## 2023-02-12 NOTE — Therapy (Signed)
OUTPATIENT PHYSICAL TREATMENT NOTE   Patient Name: Kelly Grant MRN: [id number] DOB:1965/04/06, 58 y.o., female Today's Date: 02/12/2023     END OF SESSION:  PT End of Session - 02/12/23 1130     Visit Number 22    Number of Visits 25    Date for PT Re-Evaluation 03/05/23    Authorization Type [employer]    Authorization Time Period Approved 6 visits 01/21/23-03/21/23    Authorization - Visit Number 6    Authorization - Number of Visits 6    PT Start Time 1130    PT Stop Time 1208    PT Time Calculation (min) 38 min    Activity Tolerance Patient tolerated treatment well;No increased pain    Behavior During Therapy WFL for tasks assessed/performed              Past Medical History:  Diagnosis Date   Abnormal Pap smear    ASC-H   Arthritis    Chlamydia    History of chicken pox    HSV infection    Multiple sclerosis (HCC)    Neuromuscular disorder (HCC)    MS   Yeast infection    Past Surgical History:  Procedure Laterality Date   CARPAL TUNNEL RELEASE Left    in addition to wrist fracture surgery   CESAREAN SECTION     x2   COLONOSCOPY  2016   DJ-MAC-movi(exc)-TA   INCISION AND DRAINAGE ABSCESS Left    left buttock   POLYPECTOMY  2016   TA   REVERSE SHOULDER ARTHROPLASTY Right 10/30/2022   Procedure: REVERSE SHOULDER ARTHROPLASTY;  Surgeon: Teryl Lucy, MD;  Location: [facility];  Service: Orthopedics;  Laterality: Right;   TUBAL LIGATION     Patient Active Problem List   Diagnosis Date Noted   Diabetes mellitus screening 10/01/2022   ASCUS with positive high risk HPV cervical 05/07/2022   Hyperlipidemia 09/29/2021   Encounter for therapeutic drug monitoring 12/13/2014   Multiple sclerosis (HCC) 05/08/2012   HSV infection 06/22/2011    PCP: Ivonne Andrew, NP  REFERRING PROVIDER: Teryl Lucy, MD  REFERRING DIAG: S/P Right Reverse Total Shoulder Arthroplasty DOS 10/30/2022  THERAPY DIAG:  Right shoulder pain, unspecified chronicity  Muscle  weakness (generalized)  Stiffness of right shoulder, not elsewhere classified  Rationale for Evaluation and Treatment: Rehabilitation  ONSET DATE: s/p rTSA 10/30/22  SUBJECTIVE:                                                                                                                                                                                     Per eval - Pt reports ongoing shoulder pain since August 2024  which led to rTSA on 10/30/22. Since surgery has been using sling most of the time although she states at recent follow up with surgeon she was cleared to be out of it while sitting. Has been doing elbow/hand exercises. States she is able to do majority of self care tasks without assist but does have increased difficulty and is avoiding liting/heavier tasks as expected post op.  Hand dominance: Right  SUBJECTIVE STATEMENT: Patient reports no current pain, has been compliant with her HEP.   PERTINENT HISTORY: MS, rSTA 10/30/22  PAIN:  Are you having pain: no pain   Worst: 2-3/10 lateral shoulder Agg: movement  Per eval -  Location/description: lateral/superior shoulder R  Best-worst over past week: 0-5/10, calms quickly   - aggravating factors: general activity, difficulty describing otherwise - Easing factors: rest, sling, medication  PRECAUTIONS: R shoulder reverse TSA 14 weeks 02/05/23 16 weeks 02/19/23  WEIGHT BEARING RESTRICTIONS: Yes assumed NWB on surgical limb  FALLS:  Has patient fallen in last 6 months? No  LIVING ENVIRONMENT: Third floor apartment with three flights of stairs 39 year old son lives with her  OCCUPATION: Working prior to surgery - operator/lead on packing line at [employer]; lots of time up on feet, has to lift fairly heavy items at times   PLOF: Independent  PATIENT GOALS:  be able to use shoulder more again  NEXT MD VISIT: January 2025  OBJECTIVE:  Note: Objective measures were completed at Evaluation unless otherwise  noted.  DIAGNOSTIC FINDINGS:  S/p rTSA 10/30/22  PATIENT SURVEYS:  FOTO 34 current, 63 predicted 12/11/22 FOTO 50 12/28/22: FOTO 50 01/17/23 FOTO 54 02/05/23 FOTO 50   COGNITION: Overall cognitive status: Within functional limits for tasks assessed     SENSATION: No sensory complaints, not formally tested  POSTURE: Guarded posture RUE as expected, mild forward head and increased thoracic kyphosis  UPPER EXTREMITY ROM:  A/PROM Right eval Left eval Right 11/27/22 Right 11/30/22 Right 12/28/22 R 01/10/23 R 01/15/23 01/17/23  02/05/23  Shoulder flexion P: ~60 deg limited by muscle guarding   P: ~80 deg P: 96 deg painless with towel slides at table A: 88 deg (more scaption due to ER deficits) P: 121 deg  A: 101 deg stiff w/ shoulder hike, no pain (scaption rather than pure flexion) P: 133 deg  A: 95 deg limited by stiffness rather than pain A: seated 96 deg s, pre session  A: supine 131 deg  A: post session seated  106 deg with mild hike   P: 136 deg s A: 95 deg seated pre session, painless  A: 120 deg supine  Shoulder abduction P: ~70 deg  P: ~80 deg  P: 98 deg without pain P: 108 deg without pain, stiff end feel P: 100 deg  A: 94 deg s  P: 98 deg  Shoulder internal rotation NT          Shoulder external rotation NT    P: neutral without pain, limited by stiffness P: 3 deg with stiff end feel P: just past neutral, stiff P: 6 deg, painless but firm end feel P: 12-14 deg from 0-45 deg abd painless  Elbow flexion           Elbow extension           Wrist flexion           Wrist extension            (Blank rows = not tested) (Key: WFL = within functional  limits not formally assessed, * = concordant pain, s = stiffness/stretching sensation, NT = not tested)  Comments: pt denies pain with ROM  UPPER EXTREMITY MMT:  MMT Right 02/05/23 Left 02/05/23  Shoulder flexion 3+ painless  4  Shoulder extension    Shoulder abduction 4 submax, painless 4+  Shoulder extension    Shoulder  internal rotation    Shoulder external rotation    Elbow flexion    Elbow extension    Grip strength    (Blank rows = not tested)  (Key: WFL = within functional limits not formally assessed, * = concordant pain, s = stiffness/stretching sensation, NT = not tested)  Comments: deferred on eval given acuity of surgery  SHOULDER SPECIAL TESTS: Deferred given acuity of surgery  JOINT MOBILITY TESTING:  Deferred given acuity of surgery  PALPATION:  Deferred - visual inspection of incision covered in steristrips, no apparent erythema or swelling, grossly WNL   TODAY'S TREATMENT:   [facility] Adult PT Treatment:                                                DATE: 02/12/23 Therapeutic Exercise: Supine shoulder flexion 1# DB 2x8 (2# too heavy today, compensated form) Supine chest press 2# 2x8 RUE Wall slides x10 cues for reduced shrug Sidelying trio: flexion, abduction, ER 1# RUE 2x8 ea 3x30sec rhythmic stabilizations long lever low amplitude    [facility] Adult PT Treatment:                                                DATE: 02/05/23 Therapeutic Exercise: Supine shoulder flexion 1# DB x12, 2# x5 Supine chest press 2# 2x8 Wall slides x10 cues for reduced shrug HEP education/discussion  Therapeutic Activity: MSK assessment + education FOTO + education Education/discussion re: progress with PT, symptom behavior as it affects activity tolerance, PT goals/POC     PATIENT EDUCATION: Education details: rationale for interventions, HEP, PT goals/POC  Person educated: Patient Education method: Explanation, Demonstration, Tactile cues, Verbal cues Education comprehension: verbalized understanding, returned demonstration, verbal cues required, tactile cues required  HOME EXERCISE PROGRAM: Access Code: [email or web address] URL: [email or web address] Date: 02/12/2023 Prepared by: Berta Minor  Program Notes - please ensure with row elbow does not go into extension (keep tucked at  side)- with external rotation stretch - keep towel tucked at elbow as done in clinic to avoid going into any shoulder extension  Exercises - Standing Shoulder External Rotation Stretch in Doorway  - 2-3 x daily - 1 sets - 8 reps - Standing Shoulder Row with Anchored Resistance  - 2-3 x daily - 1 sets - 8 reps - Supine Elbow Flexion Extension AROM  - 2-3 x daily - 1 sets - 8 reps - Shoulder Flexion Wall Slide with Towel  - 2-3 x daily - 1 sets - 10 reps - Supine Shoulder Flexion Extension Full Range AROM  - 1 x daily - 4 x weekly - 2 sets - 8 reps - Supine Shoulder Press  - 1 x daily - 4 x weekly - 2 sets - 8 reps  ASSESSMENT:  CLINICAL IMPRESSION: Patient presents to PT reporting no current pain and no issues after last session. Session today focused more on strengthening  with low weights and low reps and encouraged patient to keep with protocol and not work on strengthening on consecutive days. Patient was able to tolerate all prescribed exercises with no adverse effects. Patient continues to benefit from skilled PT services and should be progressed as able to improve functional independence.    Per eval - Patient is a 58 y.o. woman who was seen today for physical therapy evaluation and treatment for R shoulder pain s/p rTSA 10/30/22. Pt endorses difficulty with daily activities and self care as expected postoperatively. Exam limited to PROM today given proximity to surgery, measurements as above. Initiation of HEP with emphasis on appropriate mechanics within pt precautions, does well with avoiding shoulder extension with scapular retraction. Tolerates session quite well overall. No adverse events, pt reports no pain on departure. Recommend skilled PT to address aforementioned deficits with aim of improving functional tolerance and reducing pain with typical activities. Pt departs today's session in no acute distress, all voiced concerns/questions addressed appropriately from PT perspective.       OBJECTIVE IMPAIRMENTS: decreased activity tolerance, decreased endurance, decreased mobility, decreased ROM, decreased strength, impaired UE functional use, postural dysfunction, and pain.   ACTIVITY LIMITATIONS: carrying, lifting, sleeping, bathing, reach over head, and hygiene/grooming  PARTICIPATION LIMITATIONS: meal prep, cleaning, laundry, driving, community activity, and occupation  PERSONAL FACTORS: Time since onset of injury/illness/exacerbation and 1 comorbidity: MS  are also affecting patient's functional outcome.   REHAB POTENTIAL: Good  CLINICAL DECISION MAKING: Stable/uncomplicated  EVALUATION COMPLEXITY: Low   GOALS: Goals reviewed with patient? Yes  SHORT TERM GOALS: Target date: 12/19/2022 Pt will demonstrate appropriate understanding and performance of initially prescribed HEP in order to facilitate improved independence with management of symptoms.  Baseline: HEP provided on eval 12/28/22: reports HEP going well Goal status: MET  2. Pt will score greater than or equal to 49 on FOTO in order to demonstrate improved perception of function due to symptoms.  Baseline: 34 12/28/22: 50 01/17/23: 54 Goal status: MET  LONG TERM GOALS: Target date: 03/05/2023   (updated 02/05/23) Pt will score 63 on FOTO in order to demonstrate improved perception of function due to symptoms. Baseline: 34 12/28/22: 50  01/17/23: 54 02/05/23: 50 Goal status: ONGOING  2.  Pt will demonstrate at least 140 degrees of active shoulder elevation in order to demonstrate improved tolerance to functional movement patterns such as overhead reaching and upper body dressing.  Baseline: see ROM chart above - PROM only on eval 12/28/22: see ROM chart above 01/17/23: see ROM chart above  02/05/23: see Rom chart above Goal status: ONGOING  3.  Pt will demonstrate at least 4+/5 shoulder flex/abduction MMT for improved symmetry of UE strength and improved tolerance to functional movements.  Baseline:  deferred on eval given proximity to surgery 01/17/23: deferred given mobility deficits and post op protocol 02/05/23: post op week 14 - see MMT chart above Goal status: ONGOING  4. Pt will report/demonstrate ability to perform upper body dressing with less than 2 point increase in pain on NPS in order to indicate improved tolerance/independence with ADLs.  Baseline: increased difficulty/modification required  12/28/22: able to perform w/o pain but does require modification/effort  01/17/23: able to perform dressing tasks without modification  Goal status: MET  5. Pt will report at least 50% decrease in overall pain levels in past week in order to facilitate improved tolerance to basic ADLs/mobility.   Baseline: 0-5/10  12/28/22: 0-4/10  01/17/23: 0-3/10 02/05/23: 0-3/10 Goal status: ONGOING  6.  Pt will demonstrate appropriate performance of final prescribed HEP in order to facilitate improved self-management of symptoms post-discharge.   Baseline: initial HEP prescribed  01/17/23: HEP going well per pt report 02/05/23: HEP going well per pt report Goal status: ONGOING  PLAN: UPDATED 02/05/23  PT FREQUENCY: 2x/week  PT DURATION: 6 weeks  PLANNED INTERVENTIONS: 97164- PT Re-evaluation, 97110-Therapeutic exercises, 97530- Therapeutic activity, 97112- Neuromuscular re-education, 97535- Self Care, 78295- Manual therapy, Patient/Family education, Balance training, Taping, Dry Needling, Joint mobilization, Spinal mobilization, Scar mobilization, Cryotherapy, and Moist heat  PLAN FOR NEXT SESSION: Review/update HEP PRN. Symptom modification strategies as indicated/appropriate. Continue along Delbert Harness protocol as appropriate.   Delbert Harness reverse TSA protocol: "Phase V (12 wks-16 wks): Progress with strength (visits as needed) Weight bearing: Advance to tolerance. ROM: No restriction. Begin light stretching or Extension and Internal Rotation,  adjusting progression based on tolerance.  Progress end range stretching in all  other planes.  Exercises: Progress slowly with strengthening into IR / Ext past neutral. All  other motions advance as tolerated with bands and light weights (1-5 lbs) with 8- 12 reps x 2-3 sets for cuff, deltoid, scapular stabilizers. Strengthening to be  performed every 2-3 days at home. Never consecutive days. Start light gym  weight training activities."   Berta Minor PTA 02/12/2023 12:10 PM

## 2023-02-14 ENCOUNTER — Other Ambulatory Visit: Payer: Self-pay

## 2023-02-14 NOTE — Progress Notes (Signed)
Specialty Pharmacy Refill Coordination Note  Kelly Grant is a 58 y.o. female contacted today regarding refills of specialty medication(s) Fingolimod HCl   Patient requested Delivery   Delivery date: 02/26/23   Verified address: [address], [place], [address]   Medication will be filled on 02/25/23.

## 2023-02-18 DIAGNOSIS — M19011 Primary osteoarthritis, right shoulder: Secondary | ICD-10-CM | POA: Diagnosis not present

## 2023-02-20 ENCOUNTER — Ambulatory Visit: Payer: Medicaid Other | Admitting: Physical Therapy

## 2023-02-20 ENCOUNTER — Encounter: Payer: Self-pay | Admitting: Physical Therapy

## 2023-02-20 DIAGNOSIS — M6281 Muscle weakness (generalized): Secondary | ICD-10-CM

## 2023-02-20 DIAGNOSIS — M25611 Stiffness of right shoulder, not elsewhere classified: Secondary | ICD-10-CM

## 2023-02-20 DIAGNOSIS — M25511 Pain in right shoulder: Secondary | ICD-10-CM | POA: Diagnosis not present

## 2023-02-20 NOTE — Therapy (Signed)
OUTPATIENT PHYSICAL TREATMENT NOTE   Patient Name: FIDELIS LOTH MRN: [id number] DOB:[birth year], 58 y.o., female Today's Date: 02/20/2023     END OF SESSION:  PT End of Session - 02/20/23 1128     Visit Number 23    Number of Visits 25    Date for PT Re-Evaluation 03/05/23    Authorization Type [employer]    Authorization Time Period Approved 6 visits 02/18/23-04/18/23    Authorization - Visit Number 1    Authorization - Number of Visits 6    PT Start Time 1130    PT Stop Time 1212    PT Time Calculation (min) 42 min    Activity Tolerance Patient tolerated treatment well;No increased pain    Behavior During Therapy WFL for tasks assessed/performed              Past Medical History:  Diagnosis Date   Abnormal Pap smear    ASC-H   Arthritis    Chlamydia    History of chicken pox    HSV infection    Multiple sclerosis (HCC)    Neuromuscular disorder (HCC)    MS   Yeast infection    Past Surgical History:  Procedure Laterality Date   CARPAL TUNNEL RELEASE Left    in addition to wrist fracture surgery   CESAREAN SECTION     x2   COLONOSCOPY  2016   DJ-MAC-movi(exc)-TA   INCISION AND DRAINAGE ABSCESS Left    left buttock   POLYPECTOMY  2016   TA   REVERSE SHOULDER ARTHROPLASTY Right 10/30/2022   Procedure: REVERSE SHOULDER ARTHROPLASTY;  Surgeon: Teryl Lucy, MD;  Location: [facility];  Service: Orthopedics;  Laterality: Right;   TUBAL LIGATION     Patient Active Problem List   Diagnosis Date Noted   Diabetes mellitus screening 10/01/2022   ASCUS with positive high risk HPV cervical 05/07/2022   Hyperlipidemia 09/29/2021   Encounter for therapeutic drug monitoring 12/13/2014   Multiple sclerosis (HCC) 05/08/2012   HSV infection 06/22/2011    PCP: Ivonne Andrew, NP  REFERRING PROVIDER: Teryl Lucy, MD  REFERRING DIAG: S/P Right Reverse Total Shoulder Arthroplasty DOS 10/30/2022  THERAPY DIAG:  Right shoulder pain, unspecified chronicity  Muscle  weakness (generalized)  Stiffness of right shoulder, not elsewhere classified  Rationale for Evaluation and Treatment: Rehabilitation  ONSET DATE: s/p rTSA 10/30/22  SUBJECTIVE:                                                                                                                                                                                     Per eval - Pt reports ongoing shoulder pain since August 2024  which led to rTSA on 10/30/22. Since surgery has been using sling most of the time although she states at recent follow up with surgeon she was cleared to be out of it while sitting. Has been doing elbow/hand exercises. States she is able to do majority of self care tasks without assist but does have increased difficulty and is avoiding liting/heavier tasks as expected post op.  Hand dominance: Right  SUBJECTIVE STATEMENT: Pt reports no pain.  She feels her HEP is going well and is helpful.  She reports the most difficulty with reaching OH at this point.   PERTINENT HISTORY: MS, rSTA 10/30/22  PAIN:  Are you having pain: no pain   Worst: 2-3/10 lateral shoulder Agg: movement  Per eval -  Location/description: lateral/superior shoulder R  Best-worst over past week: 0-5/10, calms quickly   - aggravating factors: general activity, difficulty describing otherwise - Easing factors: rest, sling, medication  PRECAUTIONS: R shoulder reverse TSA 14 weeks 02/05/23 16 weeks 02/19/23  WEIGHT BEARING RESTRICTIONS: Yes assumed NWB on surgical limb  FALLS:  Has patient fallen in last 6 months? No  LIVING ENVIRONMENT: Third floor apartment with three flights of stairs 6 year old son lives with her  OCCUPATION: Working prior to surgery - operator/lead on packing line at [employer]; lots of time up on feet, has to lift fairly heavy items at times   PLOF: Independent  PATIENT GOALS:  be able to use shoulder more again  NEXT MD VISIT: January 2025  OBJECTIVE:  Note: Objective  measures were completed at Evaluation unless otherwise noted.  DIAGNOSTIC FINDINGS:  S/p rTSA 10/30/22  PATIENT SURVEYS:  FOTO 34 current, 63 predicted 12/11/22 FOTO 50 12/28/22: FOTO 50 01/17/23 FOTO 54 02/05/23 FOTO 50   COGNITION: Overall cognitive status: Within functional limits for tasks assessed     SENSATION: No sensory complaints, not formally tested  POSTURE: Guarded posture RUE as expected, mild forward head and increased thoracic kyphosis  UPPER EXTREMITY ROM:  A/PROM Right eval Left eval Right 11/27/22 Right 11/30/22 Right 12/28/22 R 01/10/23 R 01/15/23 01/17/23  02/05/23  Shoulder flexion P: ~60 deg limited by muscle guarding   P: ~80 deg P: 96 deg painless with towel slides at table A: 88 deg (more scaption due to ER deficits) P: 121 deg  A: 101 deg stiff w/ shoulder hike, no pain (scaption rather than pure flexion) P: 133 deg  A: 95 deg limited by stiffness rather than pain A: seated 96 deg s, pre session  A: supine 131 deg  A: post session seated  106 deg with mild hike   P: 136 deg s A: 95 deg seated pre session, painless  A: 120 deg supine  Shoulder abduction P: ~70 deg  P: ~80 deg  P: 98 deg without pain P: 108 deg without pain, stiff end feel P: 100 deg  A: 94 deg s  P: 98 deg  Shoulder internal rotation NT          Shoulder external rotation NT    P: neutral without pain, limited by stiffness P: 3 deg with stiff end feel P: just past neutral, stiff P: 6 deg, painless but firm end feel P: 12-14 deg from 0-45 deg abd painless  Elbow flexion           Elbow extension           Wrist flexion           Wrist extension            (  Blank rows = not tested) (Key: WFL = within functional limits not formally assessed, * = concordant pain, s = stiffness/stretching sensation, NT = not tested)  Comments: pt denies pain with ROM  UPPER EXTREMITY MMT:  MMT Right 02/05/23 Left 02/05/23  Shoulder flexion 3+ painless  4  Shoulder extension    Shoulder abduction  4 submax, painless 4+  Shoulder extension    Shoulder internal rotation    Shoulder external rotation    Elbow flexion    Elbow extension    Grip strength    (Blank rows = not tested)  (Key: WFL = within functional limits not formally assessed, * = concordant pain, s = stiffness/stretching sensation, NT = not tested)  Comments: deferred on eval given acuity of surgery  SHOULDER SPECIAL TESTS: Deferred given acuity of surgery  JOINT MOBILITY TESTING:  Deferred given acuity of surgery  PALPATION:  Deferred - visual inspection of incision covered in steristrips, no apparent erythema or swelling, grossly WNL   TODAY'S TREATMENT:   [facility] Adult PT Treatment:                                                DATE: 02/20/23 Therapeutic Exercise: Swiss ball shoulder press - 2x10 Swiss ball shoulder flexion in supine - 2x15 Forward OH press in sitting Supine chest press 3# 2x10 RUE - challenging Supine horizontal abd - YTB - 2x10 Wall slides x10 cues for reduced shrug Sidelying trio: flexion, horizontal abduction to neutal, ER RUE 2x10 ea Row - GTB - 2x10  Manual Therapy  Shoulder flexion and ER   [facility] Adult PT Treatment:                                                DATE: 02/05/23 Therapeutic Exercise: Supine shoulder flexion 1# DB x12, 2# x5 Supine chest press 2# 2x8 Wall slides x10 cues for reduced shrug HEP education/discussion  Therapeutic Activity: MSK assessment + education FOTO + education Education/discussion re: progress with PT, symptom behavior as it affects activity tolerance, PT goals/POC     PATIENT EDUCATION: Education details: rationale for interventions, HEP, PT goals/POC  Person educated: Patient Education method: Explanation, Demonstration, Tactile cues, Verbal cues Education comprehension: verbalized understanding, returned demonstration, verbal cues required, tactile cues required  HOME EXERCISE PROGRAM: Access Code: [email or web address] URL:  [email or web address] Date: 02/12/2023 Prepared by: Berta Minor  Program Notes - please ensure with row elbow does not go into extension (keep tucked at side)- with external rotation stretch - keep towel tucked at elbow as done in clinic to avoid going into any shoulder extension  Exercises - Standing Shoulder External Rotation Stretch in Doorway  - 2-3 x daily - 1 sets - 8 reps - Standing Shoulder Row with Anchored Resistance  - 2-3 x daily - 1 sets - 8 reps - Supine Elbow Flexion Extension AROM  - 2-3 x daily - 1 sets - 8 reps - Shoulder Flexion Wall Slide with Towel  - 2-3 x daily - 1 sets - 10 reps - Supine Shoulder Flexion Extension Full Range AROM  - 1 x daily - 4 x weekly - 2 sets - 8 reps - Supine Shoulder Press  - 1 x daily - 4  x weekly - 2 sets - 8 reps  ASSESSMENT:  CLINICAL IMPRESSION: Dariann tolerated session well with no adverse reaction.  Worked on shoulder ROM followed by basic strengthening.  Pt continues to be limited in ER ROM which does improve with manual therapy.  Pt cued to avoid ER compensation.  PROM > AAROM > AROM.  Updated HEP.   Per eval - Patient is a 58 y.o. woman who was seen today for physical therapy evaluation and treatment for R shoulder pain s/p rTSA 10/30/22. Pt endorses difficulty with daily activities and self care as expected postoperatively. Exam limited to PROM today given proximity to surgery, measurements as above. Initiation of HEP with emphasis on appropriate mechanics within pt precautions, does well with avoiding shoulder extension with scapular retraction. Tolerates session quite well overall. No adverse events, pt reports no pain on departure. Recommend skilled PT to address aforementioned deficits with aim of improving functional tolerance and reducing pain with typical activities. Pt departs today's session in no acute distress, all voiced concerns/questions addressed appropriately from PT perspective.      OBJECTIVE  IMPAIRMENTS: decreased activity tolerance, decreased endurance, decreased mobility, decreased ROM, decreased strength, impaired UE functional use, postural dysfunction, and pain.   ACTIVITY LIMITATIONS: carrying, lifting, sleeping, bathing, reach over head, and hygiene/grooming  PARTICIPATION LIMITATIONS: meal prep, cleaning, laundry, driving, community activity, and occupation  PERSONAL FACTORS: Time since onset of injury/illness/exacerbation and 1 comorbidity: MS  are also affecting patient's functional outcome.   REHAB POTENTIAL: Good  CLINICAL DECISION MAKING: Stable/uncomplicated  EVALUATION COMPLEXITY: Low   GOALS: Goals reviewed with patient? Yes  SHORT TERM GOALS: Target date: 12/19/2022 Pt will demonstrate appropriate understanding and performance of initially prescribed HEP in order to facilitate improved independence with management of symptoms.  Baseline: HEP provided on eval 12/28/22: reports HEP going well Goal status: MET  2. Pt will score greater than or equal to 49 on FOTO in order to demonstrate improved perception of function due to symptoms.  Baseline: 34 12/28/22: 50 01/17/23: 54 Goal status: MET  LONG TERM GOALS: Target date: 03/05/2023   (updated 02/05/23) Pt will score 63 on FOTO in order to demonstrate improved perception of function due to symptoms. Baseline: 34 12/28/22: 50  01/17/23: 54 02/05/23: 50 Goal status: ONGOING  2.  Pt will demonstrate at least 140 degrees of active shoulder elevation in order to demonstrate improved tolerance to functional movement patterns such as overhead reaching and upper body dressing.  Baseline: see ROM chart above - PROM only on eval 12/28/22: see ROM chart above 01/17/23: see ROM chart above  02/05/23: see Rom chart above Goal status: ONGOING  3.  Pt will demonstrate at least 4+/5 shoulder flex/abduction MMT for improved symmetry of UE strength and improved tolerance to functional movements.  Baseline: deferred on  eval given proximity to surgery 01/17/23: deferred given mobility deficits and post op protocol 02/05/23: post op week 14 - see MMT chart above Goal status: ONGOING  4. Pt will report/demonstrate ability to perform upper body dressing with less than 2 point increase in pain on NPS in order to indicate improved tolerance/independence with ADLs.  Baseline: increased difficulty/modification required  12/28/22: able to perform w/o pain but does require modification/effort  01/17/23: able to perform dressing tasks without modification  Goal status: MET  5. Pt will report at least 50% decrease in overall pain levels in past week in order to facilitate improved tolerance to basic ADLs/mobility.   Baseline: 0-5/10  12/28/22: 0-4/10  01/17/23: 0-3/10 02/05/23: 0-3/10 Goal status: ONGOING  6. Pt will demonstrate appropriate performance of final prescribed HEP in order to facilitate improved self-management of symptoms post-discharge.   Baseline: initial HEP prescribed  01/17/23: HEP going well per pt report 02/05/23: HEP going well per pt report Goal status: ONGOING  PLAN: UPDATED 02/05/23  PT FREQUENCY: 2x/week  PT DURATION: 6 weeks  PLANNED INTERVENTIONS: 97164- PT Re-evaluation, 97110-Therapeutic exercises, 97530- Therapeutic activity, 97112- Neuromuscular re-education, 97535- Self Care, 96295- Manual therapy, Patient/Family education, Balance training, Taping, Dry Needling, Joint mobilization, Spinal mobilization, Scar mobilization, Cryotherapy, and Moist heat  PLAN FOR NEXT SESSION: Review/update HEP PRN. Symptom modification strategies as indicated/appropriate. Continue along Delbert Harness protocol as appropriate.   Delbert Harness reverse TSA protocol: "Phase V (12 wks-16 wks): Progress with strength (visits as needed) Weight bearing: Advance to tolerance. ROM: No restriction. Begin light stretching or Extension and Internal Rotation,  adjusting progression based on tolerance. Progress end  range stretching in all  other planes.  Exercises: Progress slowly with strengthening into IR / Ext past neutral. All  other motions advance as tolerated with bands and light weights (1-5 lbs) with 8- 12 reps x 2-3 sets for cuff, deltoid, scapular stabilizers. Strengthening to be  performed every 2-3 days at home. Never consecutive days. Start light gym  weight training activities."   Fredderick Phenix PT 02/20/2023 12:21 PM

## 2023-02-25 ENCOUNTER — Other Ambulatory Visit: Payer: Self-pay

## 2023-02-27 ENCOUNTER — Encounter: Payer: Self-pay | Admitting: Physical Therapy

## 2023-02-27 ENCOUNTER — Ambulatory Visit: Payer: Medicaid Other | Attending: Orthopedic Surgery | Admitting: Physical Therapy

## 2023-02-27 DIAGNOSIS — M6281 Muscle weakness (generalized): Secondary | ICD-10-CM | POA: Diagnosis not present

## 2023-02-27 DIAGNOSIS — M25511 Pain in right shoulder: Secondary | ICD-10-CM | POA: Diagnosis not present

## 2023-02-27 DIAGNOSIS — M25611 Stiffness of right shoulder, not elsewhere classified: Secondary | ICD-10-CM | POA: Insufficient documentation

## 2023-02-27 NOTE — Therapy (Signed)
 OUTPATIENT PHYSICAL TREATMENT NOTE   Patient Name: Kelly Grant MRN: [id number] DOB:1965-05-20, 58 y.o., female Today's Date: 02/27/2023     END OF SESSION:  PT End of Session - 02/27/23 1130     Visit Number 24    Number of Visits 25    Date for PT Re-Evaluation 03/05/23    Authorization Type [employer]    Authorization Time Period Approved 6 visits 02/18/23-04/18/23    Authorization - Visit Number 2    Authorization - Number of Visits 6    PT Start Time 1130    PT Stop Time 1210    PT Time Calculation (min) 40 min    Activity Tolerance Patient tolerated treatment well;No increased pain    Behavior During Therapy WFL for tasks assessed/performed              Past Medical History:  Diagnosis Date   Abnormal Pap smear    ASC-H   Arthritis    Chlamydia    History of chicken pox    HSV infection    Multiple sclerosis (HCC)    Neuromuscular disorder (HCC)    MS   Yeast infection    Past Surgical History:  Procedure Laterality Date   CARPAL TUNNEL RELEASE Left    in addition to wrist fracture surgery   CESAREAN SECTION     x2   COLONOSCOPY  2016   DJ-MAC-movi(exc)-TA   INCISION AND DRAINAGE ABSCESS Left    left buttock   POLYPECTOMY  2016   TA   REVERSE SHOULDER ARTHROPLASTY Right 10/30/2022   Procedure: REVERSE SHOULDER ARTHROPLASTY;  Surgeon: Josefina Chew, MD;  Location: [facility];  Service: Orthopedics;  Laterality: Right;   TUBAL LIGATION     Patient Active Problem List   Diagnosis Date Noted   Diabetes mellitus screening 10/01/2022   ASCUS with positive high risk HPV cervical 05/07/2022   Hyperlipidemia 09/29/2021   Encounter for therapeutic drug monitoring 12/13/2014   Multiple sclerosis (HCC) 05/08/2012   HSV infection 06/22/2011    PCP: Oley Bascom RAMAN, NP  REFERRING PROVIDER: Josefina Chew, MD  REFERRING DIAG: S/P Right Reverse Total Shoulder Arthroplasty DOS 10/30/2022  THERAPY DIAG:  Right shoulder pain, unspecified chronicity  Muscle  weakness (generalized)  Stiffness of right shoulder, not elsewhere classified  Rationale for Evaluation and Treatment: Rehabilitation  ONSET DATE: s/p rTSA 10/30/22  SUBJECTIVE:                                                                                                                                                                                     Per eval - Pt reports ongoing shoulder pain since August 2024  which led to rTSA on 10/30/22. Since surgery has been using sling most of the time although she states at recent follow up with surgeon she was cleared to be out of it while sitting. Has been doing elbow/hand exercises. States she is able to do majority of self care tasks without assist but does have increased difficulty and is avoiding liting/heavier tasks as expected post op.  Hand dominance: Right  SUBJECTIVE STATEMENT: Pt reports ongoing HEP compliance.  She feels some slow progress.     PERTINENT HISTORY: MS, rSTA 10/30/22  PAIN:  Are you having pain: no pain   Worst: 2-3/10 lateral shoulder Agg: movement  Per eval -  Location/description: lateral/superior shoulder R  Best-worst over past week: 0-5/10, calms quickly   - aggravating factors: general activity, difficulty describing otherwise - Easing factors: rest, sling, medication  PRECAUTIONS: R shoulder reverse TSA 14 weeks 02/05/23 16 weeks 02/19/23  WEIGHT BEARING RESTRICTIONS: Yes assumed NWB on surgical limb  FALLS:  Has patient fallen in last 6 months? No  LIVING ENVIRONMENT: Third floor apartment with three flights of stairs 72 year old son lives with her  OCCUPATION: Working prior to surgery - operator/lead on packing line at [employer]; lots of time up on feet, has to lift fairly heavy items at times   PLOF: Independent  PATIENT GOALS:  be able to use shoulder more again  NEXT MD VISIT: January 2025  OBJECTIVE:  Note: Objective measures were completed at Evaluation unless otherwise  noted.  DIAGNOSTIC FINDINGS:  S/p rTSA 10/30/22  PATIENT SURVEYS:  FOTO 34 current, 63 predicted 12/11/22 FOTO 50 12/28/22: FOTO 50 01/17/23 FOTO 54 02/05/23 FOTO 50   COGNITION: Overall cognitive status: Within functional limits for tasks assessed     SENSATION: No sensory complaints, not formally tested  POSTURE: Guarded posture RUE as expected, mild forward head and increased thoracic kyphosis  UPPER EXTREMITY ROM:  A/PROM Right eval Left eval Right 11/27/22 Right 11/30/22 Right 12/28/22 R 01/10/23 R 01/15/23 01/17/23  02/05/23  Shoulder flexion P: ~60 deg limited by muscle guarding   P: ~80 deg P: 96 deg painless with towel slides at table A: 88 deg (more scaption due to ER deficits) P: 121 deg  A: 101 deg stiff w/ shoulder hike, no pain (scaption rather than pure flexion) P: 133 deg  A: 95 deg limited by stiffness rather than pain A: seated 96 deg s, pre session  A: supine 131 deg  A: post session seated  106 deg with mild hike   P: 136 deg s A: 95 deg seated pre session, painless  A: 120 deg supine  Shoulder abduction P: ~70 deg  P: ~80 deg  P: 98 deg without pain P: 108 deg without pain, stiff end feel P: 100 deg  A: 94 deg s  P: 98 deg  Shoulder internal rotation NT          Shoulder external rotation NT    P: neutral without pain, limited by stiffness P: 3 deg with stiff end feel P: just past neutral, stiff P: 6 deg, painless but firm end feel P: 12-14 deg from 0-45 deg abd painless  Elbow flexion           Elbow extension           Wrist flexion           Wrist extension            (Blank rows = not tested) (Key: WFL =  within functional limits not formally assessed, * = concordant pain, s = stiffness/stretching sensation, NT = not tested)  Comments: pt denies pain with ROM  UPPER EXTREMITY MMT:  MMT Right 02/05/23 Left 02/05/23  Shoulder flexion 3+ painless  4  Shoulder extension    Shoulder abduction 4 submax, painless 4+  Shoulder extension    Shoulder  internal rotation    Shoulder external rotation    Elbow flexion    Elbow extension    Grip strength    (Blank rows = not tested)  (Key: WFL = within functional limits not formally assessed, * = concordant pain, s = stiffness/stretching sensation, NT = not tested)  Comments: deferred on eval given acuity of surgery  SHOULDER SPECIAL TESTS: Deferred given acuity of surgery  JOINT MOBILITY TESTING:  Deferred given acuity of surgery  PALPATION:  Deferred - visual inspection of incision covered in steristrips, no apparent erythema or swelling, grossly WNL   TODAY'S TREATMENT:   [facility] Adult PT Treatment:                                                DATE: 02/27/2023 Therapeutic Exercise: Supine swiss ball shoulder press - 2x15 Supine swiss ball shoulder flexion in supine - 2x15 Supine chest press 3# 3x10 RUE Supine horizontal abd - YTB - 3x10 Supine fly - 1# - 3x10 Prone Row - 1# - 3x10 Prone T - 3x10 Wall slides x10 cues for reduced shrug Row - GTB - 2x10 UE ranger - 2' - flexion and scaption  Manual Therapy  Shoulder flexion and ER ROM with gentle oscillations at end range   [facility] Adult PT Treatment:                                                DATE: 02/05/23 Therapeutic Exercise: Supine shoulder flexion 1# DB x12, 2# x5 Supine chest press 2# 2x8 Wall slides x10 cues for reduced shrug HEP education/discussion  Therapeutic Activity: MSK assessment + education FOTO + education Education/discussion re: progress with PT, symptom behavior as it affects activity tolerance, PT goals/POC     PATIENT EDUCATION: Education details: rationale for interventions, HEP, PT goals/POC  Person educated: Patient Education method: Explanation, Demonstration, Tactile cues, Verbal cues Education comprehension: verbalized understanding, returned demonstration, verbal cues required, tactile cues required  HOME EXERCISE PROGRAM: Access Code: [email or web address] URL:  [email or web address] Date: 02/27/2023 Prepared by: Helene Gasmen  Program Notes - please ensure with row elbow does not go into extension (keep tucked at side)- with external rotation stretch - keep towel tucked at elbow as done in clinic to avoid going into any shoulder extension  Exercises - Standing Shoulder External Rotation Stretch in Doorway  - 2-3 x daily - 1 sets - 8 reps - Shoulder Flexion Wall Slide with Towel  - 2-3 x daily - 1 sets - 10 reps - Supine Shoulder Flexion Extension Full Range AROM  - 1 x daily - 4 x weekly - 2 sets - 8 reps - Shoulder Overhead Press in Flexion with Dumbbells  - 1 x daily - 7 x weekly - 3 sets - 10 reps - Prone Shoulder Row  - 1 x daily - 7 x weekly - 3 sets -  10 reps - Prone Shoulder Horizontal Abduction  - 1 x daily - 7 x weekly - 3 sets - 10 reps  ASSESSMENT:  CLINICAL IMPRESSION: Kelly Grant tolerated session well with no adverse reaction.  Kelly Grant continue to have roughly the same AROM as previous sessions.  Her ER rotation ROM is slowly improving.  We concentrated on periscapular an posterior delt strengthening today which continue to show significant strength deficit.  HEP updated accordingly.   Per eval - Patient is a 58 y.o. woman who was seen today for physical therapy evaluation and treatment for R shoulder pain s/p rTSA 10/30/22. Pt endorses difficulty with daily activities and self care as expected postoperatively. Exam limited to PROM today given proximity to surgery, measurements as above. Initiation of HEP with emphasis on appropriate mechanics within pt precautions, does well with avoiding shoulder extension with scapular retraction. Tolerates session quite well overall. No adverse events, pt reports no pain on departure. Recommend skilled PT to address aforementioned deficits with aim of improving functional tolerance and reducing pain with typical activities. Pt departs today's session in no acute distress, all voiced  concerns/questions addressed appropriately from PT perspective.      OBJECTIVE IMPAIRMENTS: decreased activity tolerance, decreased endurance, decreased mobility, decreased ROM, decreased strength, impaired UE functional use, postural dysfunction, and pain.   ACTIVITY LIMITATIONS: carrying, lifting, sleeping, bathing, reach over head, and hygiene/grooming  PARTICIPATION LIMITATIONS: meal prep, cleaning, laundry, driving, community activity, and occupation  PERSONAL FACTORS: Time since onset of injury/illness/exacerbation and 1 comorbidity: MS  are also affecting patient's functional outcome.   REHAB POTENTIAL: Good  CLINICAL DECISION MAKING: Stable/uncomplicated  EVALUATION COMPLEXITY: Low   GOALS: Goals reviewed with patient? Yes  SHORT TERM GOALS: Target date: 12/19/2022 Pt will demonstrate appropriate understanding and performance of initially prescribed HEP in order to facilitate improved independence with management of symptoms.  Baseline: HEP provided on eval 12/28/22: reports HEP going well Goal status: MET  2. Pt will score greater than or equal to 49 on FOTO in order to demonstrate improved perception of function due to symptoms.  Baseline: 34 12/28/22: 50 01/17/23: 54 Goal status: MET  LONG TERM GOALS: Target date: 03/05/2023   (updated 02/05/23) Pt will score 63 on FOTO in order to demonstrate improved perception of function due to symptoms. Baseline: 34 12/28/22: 50  01/17/23: 54 02/05/23: 50 Goal status: ONGOING  2.  Pt will demonstrate at least 140 degrees of active shoulder elevation in order to demonstrate improved tolerance to functional movement patterns such as overhead reaching and upper body dressing.  Baseline: see ROM chart above - PROM only on eval 12/28/22: see ROM chart above 01/17/23: see ROM chart above  02/05/23: see Rom chart above Goal status: ONGOING  3.  Pt will demonstrate at least 4+/5 shoulder flex/abduction MMT for improved symmetry of UE  strength and improved tolerance to functional movements.  Baseline: deferred on eval given proximity to surgery 01/17/23: deferred given mobility deficits and post op protocol 02/05/23: post op week 14 - see MMT chart above Goal status: ONGOING  4. Pt will report/demonstrate ability to perform upper body dressing with less than 2 point increase in pain on NPS in order to indicate improved tolerance/independence with ADLs.  Baseline: increased difficulty/modification required  12/28/22: able to perform w/o pain but does require modification/effort  01/17/23: able to perform dressing tasks without modification  Goal status: MET  5. Pt will report at least 50% decrease in overall pain levels in past week in order  to facilitate improved tolerance to basic ADLs/mobility.   Baseline: 0-5/10  12/28/22: 0-4/10  01/17/23: 0-3/10 02/05/23: 0-3/10 Goal status: ONGOING  6. Pt will demonstrate appropriate performance of final prescribed HEP in order to facilitate improved self-management of symptoms post-discharge.   Baseline: initial HEP prescribed  01/17/23: HEP going well per pt report 02/05/23: HEP going well per pt report Goal status: ONGOING  PLAN: UPDATED 02/05/23  PT FREQUENCY: 2x/week  PT DURATION: 6 weeks  PLANNED INTERVENTIONS: 97164- PT Re-evaluation, 97110-Therapeutic exercises, 97530- Therapeutic activity, 97112- Neuromuscular re-education, 97535- Self Care, [id number]- Manual therapy, Patient/Family education, Balance training, Taping, Dry Needling, Joint mobilization, Spinal mobilization, Scar mobilization, Cryotherapy, and Moist heat  PLAN FOR NEXT SESSION: Review/update HEP PRN. Symptom modification strategies as indicated/appropriate. Continue along Beverley Millman protocol as appropriate.   Beverley Millman reverse TSA protocol: Phase V (12 wks-16 wks): Progress with strength (visits as needed) Weight bearing: Advance to tolerance. ROM: No restriction. Begin light stretching or  Extension and Internal Rotation,  adjusting progression based on tolerance. Progress end range stretching in all  other planes.  Exercises: Progress slowly with strengthening into IR / Ext past neutral. All  other motions advance as tolerated with bands and light weights (1-5 lbs) with 8- 12 reps x 2-3 sets for cuff, deltoid, scapular stabilizers. Strengthening to be  performed every 2-3 days at home. Never consecutive days. Start light gym  weight training activities.   Keymani Mclean E Minh Jasper PT 02/27/2023 12:32 PM

## 2023-03-06 ENCOUNTER — Ambulatory Visit: Payer: Medicaid Other

## 2023-03-06 DIAGNOSIS — M25611 Stiffness of right shoulder, not elsewhere classified: Secondary | ICD-10-CM | POA: Diagnosis not present

## 2023-03-06 DIAGNOSIS — M25511 Pain in right shoulder: Secondary | ICD-10-CM

## 2023-03-06 DIAGNOSIS — M6281 Muscle weakness (generalized): Secondary | ICD-10-CM | POA: Diagnosis not present

## 2023-03-06 NOTE — Therapy (Signed)
OUTPATIENT PHYSICAL THERAPY RE-CERTIFICATION NOTE   Patient Name: Kelly Grant MRN: [id number] DOB:1965/09/26, 58 y.o., female Today's Date: 03/06/2023     END OF SESSION:  PT End of Session - 03/06/23 1137     Visit Number 25    Number of Visits 28    Date for PT Re-Evaluation 03/30/23    Authorization Type BCBS    Authorization Time Period Approved 6 visits 02/18/23-04/18/23    Authorization - Visit Number 3    Authorization - Number of Visits 6    PT Start Time 1135    PT Stop Time 1213    PT Time Calculation (min) 38 min    Activity Tolerance Patient tolerated treatment well    Behavior During Therapy WFL for tasks assessed/performed               Past Medical History:  Diagnosis Date   Abnormal Pap smear    ASC-H   Arthritis    Chlamydia    History of chicken pox    HSV infection    Multiple sclerosis (HCC)    Neuromuscular disorder (HCC)    MS   Yeast infection    Past Surgical History:  Procedure Laterality Date   CARPAL TUNNEL RELEASE Left    in addition to wrist fracture surgery   CESAREAN SECTION     x2   COLONOSCOPY  2016   DJ-MAC-movi(exc)-TA   INCISION AND DRAINAGE ABSCESS Left    left buttock   POLYPECTOMY  2016   TA   REVERSE SHOULDER ARTHROPLASTY Right 10/30/2022   Procedure: REVERSE SHOULDER ARTHROPLASTY;  Surgeon: Teryl Lucy, MD;  Location: [facility];  Service: Orthopedics;  Laterality: Right;   TUBAL LIGATION     Patient Active Problem List   Diagnosis Date Noted   Diabetes mellitus screening 10/01/2022   ASCUS with positive high risk HPV cervical 05/07/2022   Hyperlipidemia 09/29/2021   Encounter for therapeutic drug monitoring 12/13/2014   Multiple sclerosis (HCC) 05/08/2012   HSV infection 06/22/2011    PCP: Ivonne Andrew, NP  REFERRING PROVIDER: Teryl Lucy, MD  REFERRING DIAG: S/P Right Reverse Total Shoulder Arthroplasty DOS 10/30/2022  THERAPY DIAG:  Right shoulder pain, unspecified chronicity  Muscle  weakness (generalized)  Stiffness of right shoulder, not elsewhere classified  Rationale for Evaluation and Treatment: Rehabilitation  ONSET DATE: s/p rTSA 10/30/22  SUBJECTIVE:                                                                                                                                                                                     Per eval - Pt reports ongoing shoulder pain since August 2024  which led to rTSA on 10/30/22. Since surgery has been using sling most of the time although she states at recent follow up with surgeon she was cleared to be out of it while sitting. Has been doing elbow/hand exercises. States she is able to do majority of self care tasks without assist but does have increased difficulty and is avoiding liting/heavier tasks as expected post op.  Hand dominance: Right  SUBJECTIVE STATEMENT:  Patient reports that PT has been helpful, but she still doesn't have the strength for lifting. She would also like to work on being able to reach the back of her hand and to scratch her back.   PERTINENT HISTORY: MS, rSTA 10/30/22  PAIN:  Are you having pain: no pain   Worst: 2-3/10 lateral shoulder Agg: movement  Per eval -  Location/description: lateral/superior shoulder R  Best-worst over past week: 0-5/10, calms quickly   - aggravating factors: general activity, difficulty describing otherwise - Easing factors: rest, sling, medication  PRECAUTIONS: R shoulder reverse TSA 14 weeks 02/05/23 16 weeks 02/19/23  WEIGHT BEARING RESTRICTIONS: Yes assumed NWB on surgical limb  FALLS:  Has patient fallen in last 6 months? No  LIVING ENVIRONMENT: Third floor apartment with three flights of stairs 26 year old son lives with her  OCCUPATION: Working prior to surgery - operator/lead on packing line at [employer]; lots of time up on feet, has to lift fairly heavy items at times   PLOF: Independent  PATIENT GOALS:  be able to use shoulder more again  NEXT  MD VISIT: January 2025  OBJECTIVE:  Note: Objective measures were completed at Evaluation unless otherwise noted.  DIAGNOSTIC FINDINGS:  S/p rTSA 10/30/22  PATIENT SURVEYS:  FOTO 34 current, 63 predicted 12/11/22 FOTO 50 12/28/22: FOTO 50 01/17/23 FOTO 54 02/05/23 FOTO 50  03/06/23: FOTO 57  COGNITION: Overall cognitive status: Within functional limits for tasks assessed     SENSATION: No sensory complaints, not formally tested  POSTURE: Guarded posture RUE as expected, mild forward head and increased thoracic kyphosis  UPPER EXTREMITY ROM:  A/PROM Right eval Left eval Right 11/27/22 Right 11/30/22 Right 12/28/22 R 01/10/23 R 01/15/23 01/17/23  02/05/23 03/06/2023   Shoulder flexion P: ~60 deg limited by muscle guarding   P: ~80 deg P: 96 deg painless with towel slides at table A: 88 deg (more scaption due to ER deficits) P: 121 deg  A: 101 deg stiff w/ shoulder hike, no pain (scaption rather than pure flexion) P: 133 deg  A: 95 deg limited by stiffness rather than pain A: seated 96 deg s, pre session  A: supine 131 deg  A: post session seated  106 deg with mild hike   P: 136 deg s A: 95 deg seated pre session, painless  A: 120 deg supine A: 130 deg, supine  Shoulder abduction P: ~70 deg  P: ~80 deg  P: 98 deg without pain P: 108 deg without pain, stiff end feel P: 100 deg  A: 94 deg s  P: 98 deg A: 104 deg  Shoulder internal rotation NT           Shoulder external rotation NT    P: neutral without pain, limited by stiffness P: 3 deg with stiff end feel P: just past neutral, stiff P: 6 deg, painless but firm end feel P: 12-14 deg from 0-45 deg abd painless A: 24 deg  P: 30 deg  Elbow flexion  Elbow extension            Wrist flexion            Wrist extension             (Blank rows = not tested) (Key: WFL = within functional limits not formally assessed, * = concordant pain, s = stiffness/stretching sensation, NT = not tested)  Comments: pt denies pain with  ROM  UPPER EXTREMITY MMT:  MMT Right 02/05/23 Left 02/05/23  Shoulder flexion 3+ painless  4  Shoulder extension    Shoulder abduction 4 submax, painless 4+  Shoulder extension    Shoulder internal rotation    Shoulder external rotation    Elbow flexion    Elbow extension    Grip strength    (Blank rows = not tested)  (Key: WFL = within functional limits not formally assessed, * = concordant pain, s = stiffness/stretching sensation, NT = not tested)  Comments: deferred on eval given acuity of surgery  SHOULDER SPECIAL TESTS: Deferred given acuity of surgery  JOINT MOBILITY TESTING:  Deferred given acuity of surgery  PALPATION:  Deferred - visual inspection of incision covered in steristrips, no apparent erythema or swelling, grossly WNL   TODAY'S TREATMENT:     [facility] Adult PT Treatment:                                                DATE: 03/06/2023  Therapeutic Exercise: Shoulder and Periscapular strengthening & stabilization  SA punches in supine 2 x 10  PNF rhythmic stabilization D2 flexion/extension in supine  2 x 5  Isometric shoulder flexion in supine 3 x 30 sec with varied direction and amplitude of perturbations Retest of ROM following exercises: 130deg flexion and 120deg abduction in supine   Therapeutic Activity:  Reassessment of objective measures and subjective assessment regarding progress towards established goals and plan for independence with prescribed home program following discharged from PT    [facility] Adult PT Treatment:                                                DATE: 02/27/2023 Therapeutic Exercise: Supine swiss ball shoulder press - 2x15 Supine swiss ball shoulder flexion in supine - 2x15 Supine chest press 3# 3x10 RUE Supine horizontal abd - YTB - 3x10 Supine fly - 1# - 3x10 Prone Row - 1# - 3x10 Prone T - 3x10 Wall slides x10 cues for reduced shrug Row - GTB - 2x10 UE ranger - 2' - flexion and scaption  Manual Therapy  Shoulder  flexion and ER ROM with gentle oscillations at end range   [facility] Adult PT Treatment:                                                DATE: 02/05/23 Therapeutic Exercise: Supine shoulder flexion 1# DB x12, 2# x5 Supine chest press 2# 2x8 Wall slides x10 cues for reduced shrug HEP education/discussion  Therapeutic Activity: MSK assessment + education FOTO + education Education/discussion re: progress with PT, symptom behavior as it affects activity tolerance, PT goals/POC  PATIENT EDUCATION: Education details: rationale for interventions, HEP, PT goals/POC  Person educated: Patient Education method: Explanation, Demonstration, Tactile cues, Verbal cues Education comprehension: verbalized understanding, returned demonstration, verbal cues required, tactile cues required  HOME EXERCISE PROGRAM: Access Code: [email or web address] URL: [email or web address] Date: 02/27/2023 Prepared by: Alphonzo Severance  Program Notes - please ensure with row elbow does not go into extension (keep tucked at side)- with external rotation stretch - keep towel tucked at elbow as done in clinic to avoid going into any shoulder extension  Exercises - Standing Shoulder External Rotation Stretch in Doorway  - 2-3 x daily - 1 sets - 8 reps - Shoulder Flexion Wall Slide with Towel  - 2-3 x daily - 1 sets - 10 reps - Supine Shoulder Flexion Extension Full Range AROM  - 1 x daily - 4 x weekly - 2 sets - 8 reps - Shoulder Overhead Press in Flexion with Dumbbells  - 1 x daily - 7 x weekly - 3 sets - 10 reps - Prone Shoulder Row  - 1 x daily - 7 x weekly - 3 sets - 10 reps - Prone Shoulder Horizontal Abduction  - 1 x daily - 7 x weekly - 3 sets - 10 reps  ASSESSMENT:  CLINICAL IMPRESSION: Mardi has attended 25 total PT visits, and has made good progress towards post-op rehab goals. She continues to demonstrate limited shoulder elevation AROM and has related limitations with some ADLs and lifting 5-10#. Plan  is to continue 1x/week for 3 weeks to further address remaining deficits, progress towards established goals, maximize outcomes, and promote independence with HEP.   Per eval - Patient is a 58 y.o. woman who was seen today for physical therapy evaluation and treatment for R shoulder pain s/p rTSA 10/30/22. Pt endorses difficulty with daily activities and self care as expected postoperatively. Exam limited to PROM today given proximity to surgery, measurements as above. Initiation of HEP with emphasis on appropriate mechanics within pt precautions, does well with avoiding shoulder extension with scapular retraction. Tolerates session quite well overall. No adverse events, pt reports no pain on departure. Recommend skilled PT to address aforementioned deficits with aim of improving functional tolerance and reducing pain with typical activities. Pt departs today's session in no acute distress, all voiced concerns/questions addressed appropriately from PT perspective.      OBJECTIVE IMPAIRMENTS: decreased activity tolerance, decreased endurance, decreased mobility, decreased ROM, decreased strength, impaired UE functional use, postural dysfunction, and pain.   ACTIVITY LIMITATIONS: carrying, lifting, sleeping, bathing, reach over head, and hygiene/grooming  PARTICIPATION LIMITATIONS: meal prep, cleaning, laundry, driving, community activity, and occupation  PERSONAL FACTORS: Time since onset of injury/illness/exacerbation and 1 comorbidity: MS  are also affecting patient's functional outcome.   REHAB POTENTIAL: Good  CLINICAL DECISION MAKING: Stable/uncomplicated  EVALUATION COMPLEXITY: Low   GOALS: Goals reviewed with patient? Yes  SHORT TERM GOALS: Target date: 12/19/2022 Pt will demonstrate appropriate understanding and performance of initially prescribed HEP in order to facilitate improved independence with management of symptoms.  Baseline: HEP provided on eval 12/28/22: reports HEP going  well Goal status: MET  2. Pt will score greater than or equal to 49 on FOTO in order to demonstrate improved perception of function due to symptoms.  Baseline: 34 12/28/22: 50 01/17/23: 54 Goal status: MET  LONG TERM GOALS: Target date: 03/30/2023    (updated 03/06/2023)  Pt will score 63 on FOTO in order to demonstrate improved perception of function due to symptoms. Baseline: 34 12/28/22: 50  01/17/23: 54 02/05/23: 50 03/06/23: 57 Goal status: PROGRESSING  2.  Pt will demonstrate at least 140 degrees of active shoulder elevation in order to demonstrate improved tolerance to functional movement patterns such as overhead reaching and upper body dressing.  Baseline: see ROM chart above - PROM only on eval 12/28/22: see ROM chart above 01/17/23: see ROM chart above  02/05/23: see Rom chart above 03/06/23: see ROM chart above  Goal status: PROGRESSING  3.  Pt will demonstrate at least 4+/5 shoulder flex/abduction MMT for improved symmetry of UE strength and improved tolerance to functional movements.  Baseline: deferred on eval given proximity to surgery 01/17/23: deferred given mobility deficits and post op protocol 02/05/23: post op week 14 - see MMT chart above Goal status: ONGOING  4. Pt will report/demonstrate ability to perform upper body dressing with less than 2 point increase in pain on NPS in order to indicate improved tolerance/independence with ADLs.  Baseline: increased difficulty/modification required  12/28/22: able to perform w/o pain but does require modification/effort  01/17/23: able to perform dressing tasks without modification  Goal status: MET  5. Pt will report at least 50% decrease in overall pain levels in past week in order to facilitate improved tolerance to basic ADLs/mobility.   Baseline: 0-5/10  12/28/22: 0-4/10  01/17/23: 0-3/10 02/05/23: 0-3/10 Goal status: MET  6. Pt will demonstrate appropriate performance of final prescribed HEP in order to  facilitate improved self-management of symptoms post-discharge.   Baseline: initial HEP prescribed  01/17/23: HEP going well per pt report 02/05/23: HEP going well per pt report Goal status: ONGOING  7. Patient demonstrate improved functional ER reaching, in order to perform upper body dressing and simple hair styling tasks.   03/06/2023: unable   Goal status: INITIAL   PLAN: UPDATED 02/05/23  PT FREQUENCY: 1x/week  PT DURATION: 3 weeks, last updated 03/06/23  PLANNED INTERVENTIONS: 97164- PT Re-evaluation, 97110-Therapeutic exercises, 97530- Therapeutic activity, 97112- Neuromuscular re-education, 97535- Self Care, 16109- Manual therapy, Patient/Family education, Balance training, Taping, Dry Needling, Joint mobilization, Spinal mobilization, Scar mobilization, Cryotherapy, and Moist heat  PLAN FOR NEXT SESSION: Review/update HEP PRN. Symptom modification strategies as indicated/appropriate. Continue along Delbert Harness protocol as appropriate.   Delbert Harness reverse TSA protocol: "Phase V (12 wks-16 wks): Progress with strength (visits as needed) Weight bearing: Advance to tolerance. ROM: No restriction. Begin light stretching or Extension and Internal Rotation,  adjusting progression based on tolerance. Progress end range stretching in all  other planes.  Exercises: Progress slowly with strengthening into IR / Ext past neutral. All  other motions advance as tolerated with bands and light weights (1-5 lbs) with 8- 12 reps x 2-3 sets for cuff, deltoid, scapular stabilizers. Strengthening to be  performed every 2-3 days at home. Never consecutive days. Start light gym  weight training activities."   Mauri Reading, PT, DPT  03/06/2023 12:44 PM

## 2023-03-13 ENCOUNTER — Ambulatory Visit: Payer: Medicaid Other

## 2023-03-18 ENCOUNTER — Ambulatory Visit: Payer: Medicaid Other | Admitting: Neurology

## 2023-03-18 ENCOUNTER — Encounter: Payer: Self-pay | Admitting: Neurology

## 2023-03-18 VITALS — BP 153/113 | HR 87 | Ht 64.0 in | Wt 159.0 lb

## 2023-03-18 DIAGNOSIS — G35 Multiple sclerosis: Secondary | ICD-10-CM

## 2023-03-18 NOTE — Progress Notes (Signed)
 ASSESSMENT AND PLAN   Relapsing remitting multiple sclerosis Overall's stable, continue generic Gilenya  Repeat MRI of the brain with without contrast  Laboratory evaluation including CBC with differentiation   Return To Clinic With NP In 6 Months   DIAGNOSTIC DATA (LABS, IMAGING, TESTING) - I reviewed patient records, labs, notes, testing and imaging myself where available.  This MRI of the brain with and without contrast shows the following in August 2023 1.  Multiple T2/FLAIR hyperintense foci in the hemispheres with a couple foci in the infratentorial white matter in a pattern consistent with chronic demyelinating plaque associated with multiple sclerosis.  None of the foci appear to be acute.  They do not enhance.  Compared to the MRI from 08/01/2019, there are no new lesions. 2.  No acute findings.  Incidental small developmental venous anomaly in the right cerebellar hemisphere, also seen on the 2021 MRI is not clinically significant.  HISTORY OF PRESENT ILLNESS: Follow-up for relapsing remitting multiple sclerosis Right optic neuritis in 1980s, then left optic neuritis in May 2006, MRI of the brain showed typical MS lesion  Has been on Gilenya for many years, currently on disability, had some right shoulder pain recently,  Most recent MRI of the brain with and without contrast was in August 2023, stable multiple T2/FLAIR hyperintensity lesion in both hemisphere, there are also few foci in the infratentorial white matter, no contrast-enhancement, stable compared to previous scan in July 2021  She has some fatigue, but put herself through, denied gait abnormality, right arm limitation due to right shoulder pain    PHYSICAL EXAM  Vitals:   03/18/23 1023  BP: (!) 153/113  Pulse: 87  Weight: 159 lb (72.1 kg)  Height: 5\' 4"  (1.626 m)   Body mass index is 27.29 kg/m.   PHYSICAL EXAMNIATION:  Gen: NAD, conversant, well nourised, well groomed                      Cardiovascular: Regular rate rhythm, no peripheral edema, warm, nontender. Eyes: Conjunctivae clear without exudates or hemorrhage Neck: Supple, no carotid bruits. Pulmonary: Clear to auscultation bilaterally   NEUROLOGICAL EXAM:  MENTAL STATUS: Speech/Cognition: Awake, alert, normal speech, oriented to history taking and casual conversation.  CRANIAL NERVES: CN II: Visual fields are full to confrontation.  Pupils are round equal and briskly reactive to light. CN III, IV, VI: extraocular movement are normal. No ptosis. CN V: Facial sensation is intact to light touch. CN VII: Face is symmetric with normal eye closure and smile. CN VIII: Hearing is normal to casual conversation CN IX, X: Palate elevates symmetrically. Phonation is normal. CN XI: Head turning and shoulder shrug are intact CN XII: Tongue is midline with normal movements and no atrophy.  MOTOR: Right shoulder limitation due to right shoulder pain  REFLEXES: Reflexes are 2  and symmetric at the biceps, triceps, knees and ankles. Plantar responses are flexor.  SENSORY: Intact to light touch, pinprick, positional and vibratory sensation at fingers and toes.  COORDINATION: There is no trunk or limb ataxia.    GAIT/STANCE: Posture is normal. Gait is steady     REVIEW OF SYSTEMS: Out of a complete 14 system review of symptoms, the patient complains only of the following symptoms, and all other reviewed systems are negative.  See HPI  ALLERGIES: Allergies  Allergen Reactions   Macrobid [Nitrofurantoin Macrocrystal] Hives    HOME MEDICATIONS: Outpatient Medications Prior to Visit  Medication Sig Dispense Refill  aspirin EC 81 MG tablet Take 1 tablet (81 mg total) by mouth daily. 30 tablet 11   Boswellia-Glucosamine-Vit D (OSTEO BI-FLEX ONE PER DAY PO) Take 2 tablets by mouth daily at 6 (six) AM.     calcium-vitamin D (OSCAL WITH D) 500-200 MG-UNIT tablet Take 2 tablets by mouth in the morning.      carboxymethylcellulose (REFRESH PLUS) 0.5 % SOLN Place 1 drop into both eyes 2 (two) times daily as needed (dry/irritated eyes.).     Fingolimod HCl 0.5 MG CAPS TAKE ONE CAPSULE BY MOUTH ONCE DAILY. MAY TAKE WITH OR WITHOUT FOOD. STORE AT ROOM TEMPERATURE. 90 capsule 3   meloxicam (MOBIC) 15 MG tablet Take 1 tablet (15 mg total) by mouth daily as needed (pain/inflammation.). 30 tablet 0   Multiple Vitamin (MULTIVITAMIN) tablet Take 1 tablet by mouth daily. 30 tablet 3   omega-3 acid ethyl esters (LOVAZA) 1 g capsule Take 1 capsule (1 g total) by mouth daily. 90 capsule 3   Probiotic Product (PROBIOTIC PO) Take 1 capsule by mouth daily at 6 (six) AM.     rosuvastatin (CRESTOR) 10 MG tablet TAKE 1 TABLET(10 MG) BY MOUTH AT BEDTIME 90 tablet 1   sennosides-docusate sodium (SENOKOT-S) 8.6-50 MG tablet Take 2 tablets by mouth daily. 30 tablet 1   valACYclovir (VALTREX) 500 MG tablet TAKE 1 TABLET(500 MG) BY MOUTH DAILY 90 tablet 0   Wheat Dextrin (BENEFIBER DRINK MIX PO) Take 1 Dose by mouth in the morning and at bedtime.     baclofen (LIORESAL) 10 MG tablet Take 1 tablet (10 mg total) by mouth 3 (three) times daily. (Patient not taking: Reported on 03/18/2023) 30 each 0   baclofen (LIORESAL) 10 MG tablet Take 1 tablet (10 mg total) by mouth 3 (three) times daily as needed for muscle spasm (Patient not taking: Reported on 03/18/2023) 50 tablet 0   ondansetron (ZOFRAN) 4 MG tablet Take 1 tablet (4 mg total) by mouth every 8 (eight) hours as needed for nausea or vomiting. (Patient not taking: Reported on 03/18/2023) 10 tablet 0   oxyCODONE (ROXICODONE) 5 MG immediate release tablet Take 1 tablet (5 mg total) by mouth every 4 (four) hours as needed for severe pain. (Patient not taking: Reported on 03/18/2023) 30 tablet 0   No facility-administered medications prior to visit.    PAST MEDICAL HISTORY: Past Medical History:  Diagnosis Date   Abnormal Pap smear    ASC-H   Arthritis    Chlamydia    History  of chicken pox    HSV infection    Multiple sclerosis (HCC)    Neuromuscular disorder (HCC)    MS   Yeast infection     PAST SURGICAL HISTORY: Past Surgical History:  Procedure Laterality Date   CARPAL TUNNEL RELEASE Left    in addition to wrist fracture surgery   CESAREAN SECTION     x2   COLONOSCOPY  2016   DJ-MAC-movi(exc)-TA   INCISION AND DRAINAGE ABSCESS Left    left buttock   POLYPECTOMY  2016   TA   REVERSE SHOULDER ARTHROPLASTY Right 10/30/2022   Procedure: REVERSE SHOULDER ARTHROPLASTY;  Surgeon: Teryl Lucy, MD;  Location: [facility];  Service: Orthopedics;  Laterality: Right;   TUBAL LIGATION      FAMILY HISTORY: Family History  Problem Relation Age of Onset   Colon polyps Mother    Kidney failure Mother    Diabetes Mother    Colon cancer Neg Hx    Esophageal  cancer Neg Hx    Rectal cancer Neg Hx    Stomach cancer Neg Hx     SOCIAL HISTORY: Social History   Socioeconomic History   Marital status: Single    Spouse name: Not on file   Number of children: 2   Years of education: College    Highest education level: Bachelor's degree (e.g., BA, AB, BS)  Occupational History   Occupation: HOUSEKEEPING    Employer: [employer]  Tobacco Use   Smoking status: Former    Current packs/day: 0.00    Types: Cigarettes    Quit date: 01/23/2000    Years since quitting: 23.1   Smokeless tobacco: Never  Vaping Use   Vaping status: Never Used  Substance and Sexual Activity   Alcohol use: Yes    Alcohol/week: 1.0 standard drink of alcohol    Types: 1 Standard drinks or equivalent per week    Comment: rarely   Drug use: No   Sexual activity: Not Currently    Partners: Male    Birth control/protection: Surgical    Comment: tubal ligation per pt  Other Topics Concern   Not on file  Social History Narrative   Patient lives at home with children.    Patient has 2 children.    Patient has a college degree.    Patient is single.    Patient is right handed.     Patient is currently employed.    Social Drivers of Corporate investment banker Strain: Not on file  Food Insecurity: Not on file  Transportation Needs: Not on file  Physical Activity: Not on file  Stress: Not on file  Social Connections: Not on file  Intimate Partner Violence: Not on file   Levert Feinstein, M.D. Ph.D.  [facility] [address] [place], [place] [address] Phone: [phone] Fax:      [phone]

## 2023-03-19 ENCOUNTER — Encounter: Payer: Self-pay | Admitting: Dermatology

## 2023-03-19 ENCOUNTER — Ambulatory Visit: Payer: Medicaid Other | Admitting: Dermatology

## 2023-03-19 VITALS — BP 125/74 | HR 77

## 2023-03-19 DIAGNOSIS — L820 Inflamed seborrheic keratosis: Secondary | ICD-10-CM | POA: Diagnosis not present

## 2023-03-19 LAB — COMPREHENSIVE METABOLIC PANEL
ALT: 54 [IU]/L — ABNORMAL HIGH (ref 0–32)
AST: 88 [IU]/L — ABNORMAL HIGH (ref 0–40)
Albumin: 4.2 g/dL (ref 3.8–4.9)
Alkaline Phosphatase: 89 [IU]/L (ref 44–121)
BUN/Creatinine Ratio: 20 (ref 9–23)
BUN: 14 mg/dL (ref 6–24)
Bilirubin Total: 0.4 mg/dL (ref 0.0–1.2)
CO2: 24 mmol/L (ref 20–29)
Calcium: 9.2 mg/dL (ref 8.7–10.2)
Chloride: 105 mmol/L (ref 96–106)
Creatinine, Ser: 0.7 mg/dL (ref 0.57–1.00)
Globulin, Total: 2.1 g/dL (ref 1.5–4.5)
Glucose: 105 mg/dL — ABNORMAL HIGH (ref 70–99)
Potassium: 4 mmol/L (ref 3.5–5.2)
Sodium: 146 mmol/L — ABNORMAL HIGH (ref 134–144)
Total Protein: 6.3 g/dL (ref 6.0–8.5)
eGFR: 100 mL/min/{1.73_m2} (ref >59)

## 2023-03-19 LAB — CBC WITH DIFFERENTIAL/PLATELET
Basophils Absolute: 0 10*3/uL (ref 0.0–0.2)
Basos: 0 %
EOS (ABSOLUTE): 0 10*3/uL (ref 0.0–0.4)
Eos: 0 %
Hematocrit: 39.1 % (ref 34.0–46.6)
Hemoglobin: 13 g/dL (ref 11.1–15.9)
Immature Grans (Abs): 0 10*3/uL (ref 0.0–0.1)
Immature Granulocytes: 0 %
Lymphocytes Absolute: 0.6 10*3/uL — ABNORMAL LOW (ref 0.7–3.1)
Lymphs: 16 %
MCH: 32.7 pg (ref 26.6–33.0)
MCHC: 33.2 g/dL (ref 31.5–35.7)
MCV: 99 fL — ABNORMAL HIGH (ref 79–97)
Monocytes Absolute: 0.4 10*3/uL (ref 0.1–0.9)
Monocytes: 10 %
Neutrophils Absolute: 2.8 10*3/uL (ref 1.4–7.0)
Neutrophils: 74 %
Platelets: 299 10*3/uL (ref 150–450)
RBC: 3.97 x10E6/uL (ref 3.77–5.28)
RDW: 13.4 % (ref 11.7–15.4)
WBC: 3.8 10*3/uL (ref 3.4–10.8)

## 2023-03-19 LAB — TSH: TSH: 1.31 u[IU]/mL (ref 0.450–4.500)

## 2023-03-19 NOTE — Patient Instructions (Signed)
 Important Information  Due to recent changes in healthcare laws, you may see results of your pathology and/or laboratory studies on MyChart before the doctors have had a chance to review them. We understand that in some cases there may be results that are confusing or concerning to you. Please understand that not all results are received at the same time and often the doctors may need to interpret multiple results in order to provide you with the best plan of care or course of treatment. Therefore, we ask that you please give [place] 2 business days to thoroughly review all your results before contacting the office for clarification. Should we see a critical lab result, you will be contacted sooner.   If You Need Anything After Your Visit  If you have any questions or concerns for your doctor, please call our main line at [phone] If no one answers, please leave a voicemail as directed and we will return your call as soon as possible. Messages left after 4 pm will be answered the following business day.   You may also send [place] a message via MyChart. We typically respond to MyChart messages within 1-2 business days.  For prescription refills, please ask your pharmacy to contact our office. Our fax number is [phone].  If you have an urgent issue when the clinic is closed that cannot wait until the next business day, you can page your doctor at the number below.    Please note that while we do our best to be available for urgent issues outside of office hours, we are not available 24/7.   If you have an urgent issue and are unable to reach [place], you may choose to seek medical care at your doctor's office, retail clinic, urgent care center, or emergency room.  If you have a medical emergency, please immediately call 911 or go to the emergency department. In the event of inclement weather, please call our main line at [phone] for an update on the status of any delays or  closures.  Dermatology Medication Tips: Please keep the boxes that topical medications come in in order to help keep track of the instructions about where and how to use these. Pharmacies typically print the medication instructions only on the boxes and not directly on the medication tubes.   If your medication is too expensive, please contact our office at [phone] or send [place] a message through MyChart.   We are unable to tell what your co-pay for medications will be in advance as this is different depending on your insurance coverage. However, we may be able to find a substitute medication at lower cost or fill out paperwork to get insurance to cover a needed medication.   If a prior authorization is required to get your medication covered by your insurance company, please allow [place] 1-2 business days to complete this process.  Drug prices often vary depending on where the prescription is filled and some pharmacies may offer cheaper prices.  The website www.goodrx.com contains coupons for medications through different pharmacies. The prices here do not account for what the cost may be with help from insurance (it may be cheaper with your insurance), but the website can give you the price if you did not use any insurance.  - You can print the associated coupon and take it with your prescription to the pharmacy.  - You may also stop by our office during regular business hours and pick up a GoodRx coupon card.  - If  you need your prescription sent electronically to a different pharmacy, notify our office through [facility] or by phone at [phone]    Skin Education :   I counseled the patient regarding the following: Sun screen (SPF 30 or greater) should be applied during peak UV exposure (between 10am and 2pm) and reapplied after exercise or swimming.  The ABCDEs of melanoma were reviewed with the patient, and the importance of monthly self-examination of moles was emphasized.  Should any moles change in shape or color, or itch, bleed or burn, pt will contact our office for evaluation sooner then their interval appointment.  Plan: Sunscreen Recommendations I recommended a broad spectrum sunscreen with a SPF of 30 or higher. I explained that SPF 30 sunscreens block approximately 97 percent of the sun's harmful rays. Sunscreens should be applied at least 15 minutes prior to expected sun exposure and then every 2 hours after that as long as sun exposure continues. If swimming or exercising sunscreen should be reapplied every 45 minutes to an hour after getting wet or sweating. One ounce, or the equivalent of a shot glass full of sunscreen, is adequate to protect the skin not covered by a bathing suit. I also recommended a lip balm with a sunscreen as well. Sun protective clothing can be used in lieu of sunscreen but must be worn the entire time you are exposed to the sun's rays. For areas treated with Liquid Nitrogen:  Keep clean with soap and water.  Apply Vaseline or Aquaphor twice daily.

## 2023-03-19 NOTE — Progress Notes (Signed)
   New Patient Visit   Subjective  Kelly Grant is a 58 y.o. female who presents for the following: spot of concern. No hx or family hx of cancer. Lesions on left neck and right shoulder, present for years, previously biopsied but returned; get irritated at times.  The patient has spots, moles and lesions to be evaluated, some may be new or changing.  The following portions of the chart were reviewed this encounter and updated as appropriate: medications, allergies, medical history  Review of Systems:  No other skin or systemic complaints except as noted in HPI or Assessment and Plan.  Objective  Well appearing patient in no apparent distress; mood and affect are within normal limits.   A focused examination was performed of the following areas:   Neck  Relevant exam findings are noted in the Assessment and Plan.  Left Posterior Mandible, Left Preauricular Area, Right Supraclavicular Area Inflamed brown stuck on papules  Assessment & Plan   INFLAMED SEBORRHEIC KERATOSIS (3) Left Posterior Mandible, Left Preauricular Area, Right Supraclavicular Area Destruction of lesion - Left Posterior Mandible, Left Preauricular Area, Right Supraclavicular Area Complexity: extensive   Destruction method: cryotherapy   Timeout:  patient name, date of birth, surgical site, and procedure verified Lesion destroyed using liquid nitrogen: Yes   Region frozen until ice ball extended beyond lesion: Yes   Cryotherapy cycles:  2 Outcome: patient tolerated procedure well with no complications   Post-procedure details: wound care instructions given    Return in about 6 weeks (around 04/30/2023) for f/u for isk.  Dominga Ferry, Surg Tech III, am acting as scribe for Gwenith Daily, MD.   Documentation: I have reviewed the above documentation for accuracy and completeness, and I agree with the above.  Gwenith Daily, MD

## 2023-03-20 ENCOUNTER — Other Ambulatory Visit: Payer: Self-pay | Admitting: Nurse Practitioner

## 2023-03-20 ENCOUNTER — Ambulatory Visit: Payer: Medicaid Other

## 2023-03-20 ENCOUNTER — Other Ambulatory Visit (HOSPITAL_COMMUNITY): Payer: Self-pay

## 2023-03-20 ENCOUNTER — Other Ambulatory Visit: Payer: Self-pay

## 2023-03-20 DIAGNOSIS — M25611 Stiffness of right shoulder, not elsewhere classified: Secondary | ICD-10-CM | POA: Diagnosis not present

## 2023-03-20 DIAGNOSIS — Z1231 Encounter for screening mammogram for malignant neoplasm of breast: Secondary | ICD-10-CM | POA: Diagnosis not present

## 2023-03-20 DIAGNOSIS — M6281 Muscle weakness (generalized): Secondary | ICD-10-CM | POA: Diagnosis not present

## 2023-03-20 DIAGNOSIS — M25511 Pain in right shoulder: Secondary | ICD-10-CM

## 2023-03-20 DIAGNOSIS — B009 Herpesviral infection, unspecified: Secondary | ICD-10-CM

## 2023-03-20 LAB — HM MAMMOGRAPHY: HM Mammogram: NORMAL (ref 0–4)

## 2023-03-20 NOTE — Progress Notes (Signed)
 Specialty Pharmacy Refill Coordination Note  Kelly Grant is a 58 y.o. female contacted today regarding refills of specialty medication(s) Fingolimod HCl   Patient requested [name] at [facility] date: 03/25/23   Medication will be filled on 03/22/23.

## 2023-03-20 NOTE — Telephone Encounter (Signed)
 Please advise [facility]

## 2023-03-20 NOTE — Therapy (Signed)
 OUTPATIENT PHYSICAL THERAPY RE-CERTIFICATION NOTE   Patient Name: Kelly Grant MRN: [id number] DOB:28-Jul-1965, 58 y.o., female Today's Date: 03/20/2023     END OF SESSION:  PT End of Session - 03/20/23 1141     Visit Number 26    Number of Visits 28    Date for PT Re-Evaluation 03/30/23    Authorization Type BCBS    Authorization Time Period Approved 6 visits 02/18/23-04/18/23    Authorization - Visit Number 4    Authorization - Number of Visits 6    PT Start Time 1142    PT Stop Time 1215    PT Time Calculation (min) 33 min    Activity Tolerance Patient tolerated treatment well    Behavior During Therapy WFL for tasks assessed/performed                Past Medical History:  Diagnosis Date   Abnormal Pap smear    ASC-H   Arthritis    Chlamydia    History of chicken pox    HSV infection    Multiple sclerosis (HCC)    Neuromuscular disorder (HCC)    MS   Yeast infection    Past Surgical History:  Procedure Laterality Date   CARPAL TUNNEL RELEASE Left    in addition to wrist fracture surgery   CESAREAN SECTION     x2   COLONOSCOPY  2016   DJ-MAC-movi(exc)-TA   INCISION AND DRAINAGE ABSCESS Left    left buttock   POLYPECTOMY  2016   TA   REVERSE SHOULDER ARTHROPLASTY Right 10/30/2022   Procedure: REVERSE SHOULDER ARTHROPLASTY;  Surgeon: Teryl Lucy, MD;  Location: [facility];  Service: Orthopedics;  Laterality: Right;   TUBAL LIGATION     Patient Active Problem List   Diagnosis Date Noted   Diabetes mellitus screening 10/01/2022   ASCUS with positive high risk HPV cervical 05/07/2022   Hyperlipidemia 09/29/2021   Encounter for therapeutic drug monitoring 12/13/2014   Relapsing remitting multiple sclerosis (HCC) 05/08/2012   HSV infection 06/22/2011    PCP: Ivonne Andrew, NP  REFERRING PROVIDER: Teryl Lucy, MD  REFERRING DIAG: S/P Right Reverse Total Shoulder Arthroplasty DOS 10/30/2022  THERAPY DIAG:  Right shoulder pain, unspecified  chronicity  Muscle weakness (generalized)  Stiffness of right shoulder, not elsewhere classified  Rationale for Evaluation and Treatment: Rehabilitation  ONSET DATE: s/p rTSA 10/30/22  SUBJECTIVE:                                                                                                                                                                                     Per eval - Pt reports ongoing shoulder pain  since August 2024 which led to rTSA on 10/30/22. Since surgery has been using sling most of the time although she states at recent follow up with surgeon she was cleared to be out of it while sitting. Has been doing elbow/hand exercises. States she is able to do majority of self care tasks without assist but does have increased difficulty and is avoiding liting/heavier tasks as expected post op.  Hand dominance: Right  SUBJECTIVE STATEMENT: Patient reports no new or worsening symptoms since last PT visit.   PERTINENT HISTORY: MS, rSTA 10/30/22  PAIN:  Are you having pain: no pain   Worst: 2-3/10 lateral shoulder Agg: movement  Per eval -  Location/description: lateral/superior shoulder R  Best-worst over past week: 0-5/10, calms quickly   - aggravating factors: general activity, difficulty describing otherwise - Easing factors: rest, sling, medication  PRECAUTIONS: R shoulder reverse TSA 14 weeks 02/05/23 16 weeks 02/19/23  WEIGHT BEARING RESTRICTIONS: Yes assumed NWB on surgical limb  FALLS:  Has patient fallen in last 6 months? No  LIVING ENVIRONMENT: Third floor apartment with three flights of stairs 79 year old son lives with her  OCCUPATION: Working prior to surgery - operator/lead on packing line at [employer]; lots of time up on feet, has to lift fairly heavy items at times   PLOF: Independent  PATIENT GOALS:  be able to use shoulder more again  NEXT MD VISIT: January 2025  OBJECTIVE:  Note: Objective measures were completed at Evaluation unless  otherwise noted.  DIAGNOSTIC FINDINGS:  S/p rTSA 10/30/22  PATIENT SURVEYS:  FOTO 34 current, 63 predicted 12/11/22 FOTO 50 12/28/22: FOTO 50 01/17/23 FOTO 54 02/05/23 FOTO 50  03/06/23: FOTO 57  COGNITION: Overall cognitive status: Within functional limits for tasks assessed     SENSATION: No sensory complaints, not formally tested  POSTURE: Guarded posture RUE as expected, mild forward head and increased thoracic kyphosis  UPPER EXTREMITY ROM:  A/PROM Right eval Left eval Right 11/27/22 Right 11/30/22 Right 12/28/22 R 01/10/23 R 01/15/23 01/17/23  02/05/23 03/06/2023   Shoulder flexion P: ~60 deg limited by muscle guarding   P: ~80 deg P: 96 deg painless with towel slides at table A: 88 deg (more scaption due to ER deficits) P: 121 deg  A: 101 deg stiff w/ shoulder hike, no pain (scaption rather than pure flexion) P: 133 deg  A: 95 deg limited by stiffness rather than pain A: seated 96 deg s, pre session  A: supine 131 deg  A: post session seated  106 deg with mild hike   P: 136 deg s A: 95 deg seated pre session, painless  A: 120 deg supine A: 130 deg, supine  Shoulder abduction P: ~70 deg  P: ~80 deg  P: 98 deg without pain P: 108 deg without pain, stiff end feel P: 100 deg  A: 94 deg s  P: 98 deg A: 104 deg  Shoulder internal rotation NT           Shoulder external rotation NT    P: neutral without pain, limited by stiffness P: 3 deg with stiff end feel P: just past neutral, stiff P: 6 deg, painless but firm end feel P: 12-14 deg from 0-45 deg abd painless A: 24 deg  P: 30 deg  Elbow flexion            Elbow extension            Wrist flexion  Wrist extension             (Blank rows = not tested) (Key: WFL = within functional limits not formally assessed, * = concordant pain, s = stiffness/stretching sensation, NT = not tested)  Comments: pt denies pain with ROM  UPPER EXTREMITY MMT:  MMT Right 02/05/23 Left 02/05/23  Shoulder flexion 3+ painless  4   Shoulder extension    Shoulder abduction 4 submax, painless 4+  Shoulder extension    Shoulder internal rotation    Shoulder external rotation    Elbow flexion    Elbow extension    Grip strength    (Blank rows = not tested)  (Key: WFL = within functional limits not formally assessed, * = concordant pain, s = stiffness/stretching sensation, NT = not tested)  Comments: deferred on eval given acuity of surgery  SHOULDER SPECIAL TESTS: Deferred given acuity of surgery  JOINT MOBILITY TESTING:  Deferred given acuity of surgery  PALPATION:  Deferred - visual inspection of incision covered in steristrips, no apparent erythema or swelling, grossly WNL   TODAY'S TREATMENT:    Progress slowly with strengthening into IR / Ext past neutral. All  other motions advance as tolerated with bands and light weights (1-5 lbs) with 8- 12 reps x 2-3 sets for cuff, deltoid, scapular stabilizers. Strengthening to be  performed every 2-3 days at home. Never consecutive days. Start light gym  weight training activities."  [facility] Adult PT Treatment:                                                DATE: 03/20/2023  Therapeutic Exercise: SA punches in supine, 1 # 2 x 10  PNF rhythmic stabilization D2 flexion/extension in supine  2 x 7  Isometric shoulder flexion in supine 3 x 30 sec with varied direction and amplitude of perturbations Isometric ER/IR, x 15 Standing rows red TB, 2 x 10  Standing pull downs green, 2 x 10  Standing shoulder flexion x 15 yellow  Standing shoulder abduction x 10 yellow     [facility] Adult PT Treatment:                                                DATE: 03/06/2023  Therapeutic Exercise: Shoulder and Periscapular strengthening & stabilization  SA punches in supine 2 x 10  PNF rhythmic stabilization D2 flexion/extension in supine  2 x 5  Isometric shoulder flexion in supine 3 x 30 sec with varied direction and amplitude of perturbations Retest of ROM following  exercises: 130deg flexion and 120deg abduction in supine   Therapeutic Activity:  Reassessment of objective measures and subjective assessment regarding progress towards established goals and plan for independence with prescribed home program following discharged from PT    [facility] Adult PT Treatment:                                                DATE: 02/27/2023 Therapeutic Exercise: Supine swiss ball shoulder press - 2x15 Supine swiss ball shoulder flexion in supine - 2x15 Supine chest press 3# 3x10 RUE  Supine horizontal abd - YTB - 3x10 Supine fly - 1# - 3x10 Prone Row - 1# - 3x10 Prone T - 3x10 Wall slides x10 cues for reduced shrug Row - GTB - 2x10 UE ranger - 2' - flexion and scaption  Manual Therapy  Shoulder flexion and ER ROM with gentle oscillations at end range   [facility] Adult PT Treatment:                                                DATE: 02/05/23 Therapeutic Exercise: Supine shoulder flexion 1# DB x12, 2# x5 Supine chest press 2# 2x8 Wall slides x10 cues for reduced shrug HEP education/discussion  Therapeutic Activity: MSK assessment + education FOTO + education Education/discussion re: progress with PT, symptom behavior as it affects activity tolerance, PT goals/POC     PATIENT EDUCATION: Education details: rationale for interventions, HEP, PT goals/POC  Person educated: Patient Education method: Explanation, Demonstration, Tactile cues, Verbal cues Education comprehension: verbalized understanding, returned demonstration, verbal cues required, tactile cues required  HOME EXERCISE PROGRAM: Access Code: [email or web address] URL: [email or web address] Date: 02/27/2023 Prepared by: Alphonzo Severance  Program Notes - please ensure with row elbow does not go into extension (keep tucked at side)- with external rotation stretch - keep towel tucked at elbow as done in clinic to avoid going into any shoulder extension  Exercises - Standing Shoulder External  Rotation Stretch in Doorway  - 2-3 x daily - 1 sets - 8 reps - Shoulder Flexion Wall Slide with Towel  - 2-3 x daily - 1 sets - 10 reps - Supine Shoulder Flexion Extension Full Range AROM  - 1 x daily - 4 x weekly - 2 sets - 8 reps - Shoulder Overhead Press in Flexion with Dumbbells  - 1 x daily - 7 x weekly - 3 sets - 10 reps - Prone Shoulder Row  - 1 x daily - 7 x weekly - 3 sets - 10 reps - Prone Shoulder Horizontal Abduction  - 1 x daily - 7 x weekly - 3 sets - 10 reps  ASSESSMENT:  CLINICAL IMPRESSION: Patient continues to respond well to skilled PT. She was able to progress with shoulder and periscapular strengthening exercises. She will continue to benefit from progression of exercises with post op protocol. Plan to reassess need for additional PT visits or independence with HEP.    Per eval - Patient is a 58 y.o. woman who was seen today for physical therapy evaluation and treatment for R shoulder pain s/p rTSA 10/30/22. Pt endorses difficulty with daily activities and self care as expected postoperatively. Exam limited to PROM today given proximity to surgery, measurements as above. Initiation of HEP with emphasis on appropriate mechanics within pt precautions, does well with avoiding shoulder extension with scapular retraction. Tolerates session quite well overall. No adverse events, pt reports no pain on departure. Recommend skilled PT to address aforementioned deficits with aim of improving functional tolerance and reducing pain with typical activities. Pt departs today's session in no acute distress, all voiced concerns/questions addressed appropriately from PT perspective.      OBJECTIVE IMPAIRMENTS: decreased activity tolerance, decreased endurance, decreased mobility, decreased ROM, decreased strength, impaired UE functional use, postural dysfunction, and pain.   ACTIVITY LIMITATIONS: carrying, lifting, sleeping, bathing, reach over head, and hygiene/grooming  PARTICIPATION  LIMITATIONS: meal prep, cleaning, laundry, driving, community activity, and  occupation  PERSONAL FACTORS: Time since onset of injury/illness/exacerbation and 1 comorbidity: MS  are also affecting patient's functional outcome.   REHAB POTENTIAL: Good  CLINICAL DECISION MAKING: Stable/uncomplicated  EVALUATION COMPLEXITY: Low   GOALS: Goals reviewed with patient? Yes  SHORT TERM GOALS: Target date: 12/19/2022 Pt will demonstrate appropriate understanding and performance of initially prescribed HEP in order to facilitate improved independence with management of symptoms.  Baseline: HEP provided on eval 12/28/22: reports HEP going well Goal status: MET  2. Pt will score greater than or equal to 49 on FOTO in order to demonstrate improved perception of function due to symptoms.  Baseline: 34 12/28/22: 50 01/17/23: 54 Goal status: MET  LONG TERM GOALS: Target date: 03/30/2023    (updated 03/06/2023)  Pt will score 63 on FOTO in order to demonstrate improved perception of function due to symptoms. Baseline: 34 12/28/22: 50  01/17/23: 54 02/05/23: 50 03/06/23: 57 Goal status: PROGRESSING  2.  Pt will demonstrate at least 140 degrees of active shoulder elevation in order to demonstrate improved tolerance to functional movement patterns such as overhead reaching and upper body dressing.  Baseline: see ROM chart above - PROM only on eval 12/28/22: see ROM chart above 01/17/23: see ROM chart above  02/05/23: see Rom chart above 03/06/23: see ROM chart above  Goal status: PROGRESSING  3.  Pt will demonstrate at least 4+/5 shoulder flex/abduction MMT for improved symmetry of UE strength and improved tolerance to functional movements.  Baseline: deferred on eval given proximity to surgery 01/17/23: deferred given mobility deficits and post op protocol 02/05/23: post op week 14 - see MMT chart above Goal status: ONGOING  4. Pt will report/demonstrate ability to perform upper body dressing  with less than 2 point increase in pain on NPS in order to indicate improved tolerance/independence with ADLs.  Baseline: increased difficulty/modification required  12/28/22: able to perform w/o pain but does require modification/effort  01/17/23: able to perform dressing tasks without modification  Goal status: MET  5. Pt will report at least 50% decrease in overall pain levels in past week in order to facilitate improved tolerance to basic ADLs/mobility.   Baseline: 0-5/10  12/28/22: 0-4/10  01/17/23: 0-3/10 02/05/23: 0-3/10 Goal status: MET  6. Pt will demonstrate appropriate performance of final prescribed HEP in order to facilitate improved self-management of symptoms post-discharge.   Baseline: initial HEP prescribed  01/17/23: HEP going well per pt report 02/05/23: HEP going well per pt report Goal status: ONGOING  7. Patient demonstrate improved functional ER reaching, in order to perform upper body dressing and simple hair styling tasks.   03/06/2023: unable   Goal status: INITIAL   PLAN: UPDATED 02/05/23  PT FREQUENCY: 1x/week  PT DURATION: 3 weeks, last updated 03/06/23  PLANNED INTERVENTIONS: 97164- PT Re-evaluation, 97110-Therapeutic exercises, 97530- Therapeutic activity, 97112- Neuromuscular re-education, 97535- Self Care, 96045- Manual therapy, Patient/Family education, Balance training, Taping, Dry Needling, Joint mobilization, Spinal mobilization, Scar mobilization, Cryotherapy, and Moist heat  PLAN FOR NEXT SESSION: Review/update HEP PRN. Symptom modification strategies as indicated/appropriate. Continue along Delbert Harness protocol as appropriate.   Delbert Harness reverse TSA protocol: "Phase V (12 wks-16 wks):  Progress with strength (visits as needed) Weight bearing: Advance to tolerance. ROM: No restriction. Begin light stretching or Extension and Internal Rotation,  adjusting progression based on tolerance. Progress end range stretching in all  other planes.   Exercises: Progress slowly with strengthening into IR / Ext past neutral. All  other motions advance as  tolerated with bands and light weights (1-5 lbs) with 8- 12 reps x 2-3 sets for cuff, deltoid, scapular stabilizers. Strengthening to be  performed every 2-3 days at home. Never consecutive days. Start light gym  weight training activities."   Mauri Reading, PT, DPT  03/20/2023 12:17 PM

## 2023-03-21 ENCOUNTER — Telehealth: Payer: Self-pay | Admitting: Neurology

## 2023-03-21 ENCOUNTER — Encounter: Payer: Self-pay | Admitting: Neurology

## 2023-03-21 DIAGNOSIS — G35 Multiple sclerosis: Secondary | ICD-10-CM

## 2023-03-21 NOTE — Telephone Encounter (Signed)
 Orders Placed This Encounter  Procedures   Hepatic Function Panel   Acute Hep Panel & Hep B Surface Ab

## 2023-03-21 NOTE — Telephone Encounter (Signed)
 [facility] [place]: [id number] exp. 03/21/23-05/19/23 sent to GI [phone]

## 2023-03-22 ENCOUNTER — Other Ambulatory Visit: Payer: Self-pay

## 2023-03-27 ENCOUNTER — Encounter: Payer: Self-pay | Admitting: Physical Therapy

## 2023-03-27 ENCOUNTER — Ambulatory Visit: Payer: Medicaid Other | Attending: Orthopedic Surgery | Admitting: Physical Therapy

## 2023-03-27 DIAGNOSIS — M25611 Stiffness of right shoulder, not elsewhere classified: Secondary | ICD-10-CM | POA: Insufficient documentation

## 2023-03-27 DIAGNOSIS — M6281 Muscle weakness (generalized): Secondary | ICD-10-CM | POA: Insufficient documentation

## 2023-03-27 DIAGNOSIS — M25511 Pain in right shoulder: Secondary | ICD-10-CM | POA: Diagnosis not present

## 2023-03-27 NOTE — Therapy (Signed)
 PHYSICAL THERAPY DISCHARGE SUMMARY  Visits from Start of Care: 27  Current functional level related to goals / functional outcomes: See assessment/goals   Remaining deficits: See assessment/goals   Education / Equipment: HEP and D/C plans  Patient agrees to discharge. Patient goals were partially met. Patient is being discharged due to being pleased with the current functional level.   Patient Name: Kelly Grant MRN: [id number] DOB:July 05, 1965, 57 y.o., female Today's Date: 03/27/2023     END OF SESSION:  PT End of Session - 03/27/23 1130     Visit Number 27    Number of Visits 28    Date for PT Re-Evaluation 03/30/23    Authorization Type BCBS    Authorization Time Period Approved 6 visits 02/18/23-04/18/23    Authorization - Visit Number 5    Authorization - Number of Visits 6    PT Start Time 1130    PT Stop Time 1212    PT Time Calculation (min) 42 min    Activity Tolerance Patient tolerated treatment well    Behavior During Therapy WFL for tasks assessed/performed                Past Medical History:  Diagnosis Date   Abnormal Pap smear    ASC-H   Arthritis    Chlamydia    History of chicken pox    HSV infection    Multiple sclerosis (HCC)    Neuromuscular disorder (HCC)    MS   Yeast infection    Past Surgical History:  Procedure Laterality Date   CARPAL TUNNEL RELEASE Left    in addition to wrist fracture surgery   CESAREAN SECTION     x2   COLONOSCOPY  2016   DJ-MAC-movi(exc)-TA   INCISION AND DRAINAGE ABSCESS Left    left buttock   POLYPECTOMY  2016   TA   REVERSE SHOULDER ARTHROPLASTY Right 10/30/2022   Procedure: REVERSE SHOULDER ARTHROPLASTY;  Surgeon: Teryl Lucy, MD;  Location: [facility];  Service: Orthopedics;  Laterality: Right;   TUBAL LIGATION     Patient Active Problem List   Diagnosis Date Noted   Diabetes mellitus screening 10/01/2022   ASCUS with positive high risk HPV cervical 05/07/2022   Hyperlipidemia 09/29/2021    Encounter for therapeutic drug monitoring 12/13/2014   Relapsing remitting multiple sclerosis (HCC) 05/08/2012   HSV infection 06/22/2011    PCP: Ivonne Andrew, NP  REFERRING PROVIDER: Teryl Lucy, MD  REFERRING DIAG: S/P Right Reverse Total Shoulder Arthroplasty DOS 10/30/2022  THERAPY DIAG:  Right shoulder pain, unspecified chronicity  Muscle weakness (generalized)  Stiffness of right shoulder, not elsewhere classified  Rationale for Evaluation and Treatment: Rehabilitation  ONSET DATE: s/p rTSA 10/30/22  SUBJECTIVE:  Per eval - Pt reports ongoing shoulder pain since August 2024 which led to rTSA on 10/30/22. Since surgery has been using sling most of the time although she states at recent follow up with surgeon she was cleared to be out of it while sitting. Has been doing elbow/hand exercises. States she is able to do majority of self care tasks without assist but does have increased difficulty and is avoiding liting/heavier tasks as expected post op.  Hand dominance: Right  SUBJECTIVE STATEMENT: Pt reports that she has been doing fairly well at home.  She feels confident in self management at home.  PERTINENT HISTORY: MS, rSTA 10/30/22  PAIN:  Are you having pain: no pain   Worst: 2-3/10 lateral shoulder Agg: movement  Per eval -  Location/description: lateral/superior shoulder R  Best-worst over past week: 0-5/10, calms quickly   - aggravating factors: general activity, difficulty describing otherwise - Easing factors: rest, sling, medication  PRECAUTIONS: R shoulder reverse TSA 14 weeks 02/05/23 16 weeks 02/19/23  WEIGHT BEARING RESTRICTIONS: Yes assumed NWB on surgical limb  FALLS:  Has patient fallen in last 6 months? No  LIVING ENVIRONMENT: Third floor apartment with three  flights of stairs 37 year old son lives with her  OCCUPATION: Working prior to surgery - operator/lead on packing line at [employer]; lots of time up on feet, has to lift fairly heavy items at times   PLOF: Independent  PATIENT GOALS:  be able to use shoulder more again  NEXT MD VISIT: January 2025  OBJECTIVE:  Note: Objective measures were completed at Evaluation unless otherwise noted.  DIAGNOSTIC FINDINGS:  S/p rTSA 10/30/22  PATIENT SURVEYS:  FOTO 34 current, 63 predicted 12/11/22 FOTO 50 12/28/22: FOTO 50 01/17/23 FOTO 54 02/05/23 FOTO 50  03/06/23: FOTO 57  COGNITION: Overall cognitive status: Within functional limits for tasks assessed     SENSATION: No sensory complaints, not formally tested  POSTURE: Guarded posture RUE as expected, mild forward head and increased thoracic kyphosis  UPPER EXTREMITY ROM:  A/PROM Right eval Left eval Right 11/27/22 Right 11/30/22 Right 12/28/22 R 01/10/23 R 01/15/23 01/17/23  02/05/23 03/06/2023  3/5  Shoulder flexion P: ~60 deg limited by muscle guarding   P: ~80 deg P: 96 deg painless with towel slides at table A: 88 deg (more scaption due to ER deficits) P: 121 deg  A: 101 deg stiff w/ shoulder hike, no pain (scaption rather than pure flexion) P: 133 deg  A: 95 deg limited by stiffness rather than pain A: seated 96 deg s, pre session  A: supine 131 deg  A: post session seated  106 deg with mild hike   P: 136 deg s A: 95 deg seated pre session, painless  A: 120 deg supine A: 130 deg, supine A: 95 Gravity reduced: 130  Shoulder abduction P: ~70 deg  P: ~80 deg  P: 98 deg without pain P: 108 deg without pain, stiff end feel P: 100 deg  A: 94 deg s  P: 98 deg A: 104 deg   Shoulder internal rotation NT            Shoulder external rotation NT    P: neutral without pain, limited by stiffness P: 3 deg with stiff end feel P: just past neutral, stiff P: 6 deg, painless but firm end feel P: 12-14 deg from 0-45 deg abd painless A: 24  deg  P: 30 deg   Elbow flexion  Elbow extension             Wrist flexion             Wrist extension              (Blank rows = not tested) (Key: WFL = within functional limits not formally assessed, * = concordant pain, s = stiffness/stretching sensation, NT = not tested)  Comments: pt denies pain with ROM  UPPER EXTREMITY MMT:  MMT Right 02/05/23 Left 02/05/23 R 3/5  Shoulder flexion 3+ painless  4 4  Shoulder extension     Shoulder abduction 4 submax, painless 4+ 4  Shoulder extension     Shoulder internal rotation     Shoulder external rotation     Elbow flexion     Elbow extension     Grip strength     (Blank rows = not tested)  (Key: WFL = within functional limits not formally assessed, * = concordant pain, s = stiffness/stretching sensation, NT = not tested)  Comments: deferred on eval given acuity of surgery  SHOULDER SPECIAL TESTS: Deferred given acuity of surgery  JOINT MOBILITY TESTING:  Deferred given acuity of surgery  PALPATION:  Deferred - visual inspection of incision covered in steristrips, no apparent erythema or swelling, grossly WNL   TODAY'S TREATMENT:    Progress slowly with strengthening into IR / Ext past neutral. All  other motions advance as tolerated with bands and light weights (1-5 lbs) with 8- 12 reps x 2-3 sets for cuff, deltoid, scapular stabilizers. Strengthening to be  performed every 2-3 days at home. Never consecutive days. Start light gym  weight training activities."  [facility] Adult PT Treatment:                                                DATE: 03/27/2023  Therapeutic Exercise: SA punches in supine, 2 # 2 x 10  PNF rhythmic stabilization D2 flexion/extension in supine  2 x 1' Isometric shoulder flexion in supine 2x1' sec with varied direction and amplitude of perturbations Band IR GTB: 2x10 Band ER YTB: 2x10 Standing rows Green TB, 3 x 10  Standing pull downs green, 3 x 10    Therapeutic Activity  collecting  information for goals, checking progress, and reviewing with patient Reaching repeatedly into first shelf - 3x10 - 1# Horizontal abd/add wall taps at shoulder height - 3x10 - 1#   [facility] Adult PT Treatment:                                                DATE: 03/06/2023  Therapeutic Exercise: Shoulder and Periscapular strengthening & stabilization  SA punches in supine 2 x 10  PNF rhythmic stabilization D2 flexion/extension in supine  2 x 5  Isometric shoulder flexion in supine 3 x 30 sec with varied direction and amplitude of perturbations Retest of ROM following exercises: 130deg flexion and 120deg abduction in supine   Therapeutic Activity:  Reassessment of objective measures and subjective assessment regarding progress towards established goals and plan for independence with prescribed home program following discharged from PT    [facility] Adult PT Treatment:  DATE: 02/27/2023 Therapeutic Exercise: Supine swiss ball shoulder press - 2x15 Supine swiss ball shoulder flexion in supine - 2x15 Supine chest press 3# 3x10 RUE Supine horizontal abd - YTB - 3x10 Supine fly - 1# - 3x10 Prone Row - 1# - 3x10 Prone T - 3x10 Wall slides x10 cues for reduced shrug Row - GTB - 2x10 UE ranger - 2' - flexion and scaption  Manual Therapy  Shoulder flexion and ER ROM with gentle oscillations at end range   [facility] Adult PT Treatment:                                                DATE: 02/05/23 Therapeutic Exercise: Supine shoulder flexion 1# DB x12, 2# x5 Supine chest press 2# 2x8 Wall slides x10 cues for reduced shrug HEP education/discussion  Therapeutic Activity: MSK assessment + education FOTO + education Education/discussion re: progress with PT, symptom behavior as it affects activity tolerance, PT goals/POC     PATIENT EDUCATION: Education details: rationale for interventions, HEP, PT goals/POC  Person educated: Patient Education  method: Explanation, Demonstration, Tactile cues, Verbal cues Education comprehension: verbalized understanding, returned demonstration, verbal cues required, tactile cues required  HOME EXERCISE PROGRAM: Access Code: [email or web address] URL: [email or web address] Date: 02/27/2023 Prepared by: Alphonzo Severance  Program Notes - please ensure with row elbow does not go into extension (keep tucked at side)- with external rotation stretch - keep towel tucked at elbow as done in clinic to avoid going into any shoulder extension  Exercises - Standing Shoulder External Rotation Stretch in Doorway  - 2-3 x daily - 1 sets - 8 reps - Shoulder Flexion Wall Slide with Towel  - 2-3 x daily - 1 sets - 10 reps - Supine Shoulder Flexion Extension Full Range AROM  - 1 x daily - 4 x weekly - 2 sets - 8 reps - Shoulder Overhead Press in Flexion with Dumbbells  - 1 x daily - 7 x weekly - 3 sets - 10 reps - Prone Shoulder Row  - 1 x daily - 7 x weekly - 3 sets - 10 reps - Prone Shoulder Horizontal Abduction  - 1 x daily - 7 x weekly - 3 sets - 10 reps  ASSESSMENT:  CLINICAL IMPRESSION: EVALYNNE LOCURTO has progressed well with therapy.  Improved impairments include: shoulder AROM, pain, and shoulder strength.  Functional improvements include: ability to do light housework and reaching tasks to shoulder height.  Progressions needed include: Work on reaching above shoulder height and continued shoulder strengthening.  Carmell has overall very low pain levels and good ROM passively and with reduced gravity but still struggles with reaching above shoulder height against gravity.  We discussed reasonable expectations at length and agreed that she will continue with her HEP and practice OH movements frequently at home; she is confident in this plan.  Please see GOALS section for progress on short term and long term goals established at evaluation.  I recommend D/C home with HEP; pt agrees with plan.   Per eval -  Patient is a 59 y.o. woman who was seen today for physical therapy evaluation and treatment for R shoulder pain s/p rTSA 10/30/22. Pt endorses difficulty with daily activities and self care as expected postoperatively. Exam limited to PROM today given proximity to surgery, measurements as above. Initiation of HEP with emphasis on appropriate mechanics  within pt precautions, does well with avoiding shoulder extension with scapular retraction. Tolerates session quite well overall. No adverse events, pt reports no pain on departure. Recommend skilled PT to address aforementioned deficits with aim of improving functional tolerance and reducing pain with typical activities. Pt departs today's session in no acute distress, all voiced concerns/questions addressed appropriately from PT perspective.      OBJECTIVE IMPAIRMENTS: decreased activity tolerance, decreased endurance, decreased mobility, decreased ROM, decreased strength, impaired UE functional use, postural dysfunction, and pain.   ACTIVITY LIMITATIONS: carrying, lifting, sleeping, bathing, reach over head, and hygiene/grooming  PARTICIPATION LIMITATIONS: meal prep, cleaning, laundry, driving, community activity, and occupation  PERSONAL FACTORS: Time since onset of injury/illness/exacerbation and 1 comorbidity: MS  are also affecting patient's functional outcome.   REHAB POTENTIAL: Good  CLINICAL DECISION MAKING: Stable/uncomplicated  EVALUATION COMPLEXITY: Low   GOALS: Goals reviewed with patient? Yes  SHORT TERM GOALS: Target date: 12/19/2022 Pt will demonstrate appropriate understanding and performance of initially prescribed HEP in order to facilitate improved independence with management of symptoms.  Baseline: HEP provided on eval 12/28/22: reports HEP going well Goal status: MET  2. Pt will score greater than or equal to 49 on FOTO in order to demonstrate improved perception of function due to symptoms.  Baseline: 34 12/28/22:  50 01/17/23: 54 Goal status: MET  LONG TERM GOALS: Target date: 03/30/2023    (updated 03/06/2023)  Pt will score 63 on FOTO in order to demonstrate improved perception of function due to symptoms. Baseline: 34 12/28/22: 50  01/17/23: 54 02/05/23: 50 03/06/23: 57 3/5: 59 Goal status: Partially MET  2.  Pt will demonstrate at least 140 degrees of active shoulder elevation in order to demonstrate improved tolerance to functional movement patterns such as overhead reaching and upper body dressing.  Baseline: see ROM chart above - PROM only on eval 12/28/22: see ROM chart above 01/17/23: see ROM chart above  02/05/23: see Rom chart above 03/06/23: see ROM chart above 3/5: 95 AROM, 130 gravity reduced Goal status: Partially MET  3.  Pt will demonstrate at least 4+/5 shoulder flex/abduction MMT for improved symmetry of UE strength and improved tolerance to functional movements.  Baseline: deferred on eval given proximity to surgery 01/17/23: deferred given mobility deficits and post op protocol 02/05/23: post op week 14 - see MMT chart above 3/5:4/5 flexion in available range Goal status: partially met  4. Pt will report/demonstrate ability to perform upper body dressing with less than 2 point increase in pain on NPS in order to indicate improved tolerance/independence with ADLs.  Baseline: increased difficulty/modification required  12/28/22: able to perform w/o pain but does require modification/effort  01/17/23: able to perform dressing tasks without modification  Goal status: MET  5. Pt will report at least 50% decrease in overall pain levels in past week in order to facilitate improved tolerance to basic ADLs/mobility.   Baseline: 0-5/10  12/28/22: 0-4/10  01/17/23: 0-3/10 02/05/23: 0-3/10 Goal status: MET  6. Pt will demonstrate appropriate performance of final prescribed HEP in order to facilitate improved self-management of symptoms post-discharge.   Baseline: initial HEP  prescribed  01/17/23: HEP going well per pt report 02/05/23: HEP going well per pt report Goal status: MET  7. Patient demonstrate improved functional ER reaching, in order to perform upper body dressing and simple hair styling tasks.   03/06/2023: unable 3/5: improving, unable to reach the top of head easily   Goal status: MET   PLAN: UPDATED 02/05/23  PT FREQUENCY: 1x/week  PT DURATION: 3 weeks, last updated 03/06/23  PLANNED INTERVENTIONS: 97164- PT Re-evaluation, 97110-Therapeutic exercises, 97530- Therapeutic activity, 97112- Neuromuscular re-education, 97535- Self Care, 40981- Manual therapy, Patient/Family education, Balance training, Taping, Dry Needling, Joint mobilization, Spinal mobilization, Scar mobilization, Cryotherapy, and Moist heat  PLAN FOR NEXT SESSION: Review/update HEP PRN. Symptom modification strategies as indicated/appropriate. Continue along Delbert Harness protocol as appropriate.   Delbert Harness reverse TSA protocol: "Phase V (12 wks-16 wks):  Progress with strength (visits as needed) Weight bearing: Advance to tolerance. ROM: No restriction. Begin light stretching or Extension and Internal Rotation,  adjusting progression based on tolerance. Progress end range stretching in all  other planes.  Exercises: Progress slowly with strengthening into IR / Ext past neutral. All  other motions advance as tolerated with bands and light weights (1-5 lbs) with 8- 12 reps x 2-3 sets for cuff, deltoid, scapular stabilizers. Strengthening to be  performed every 2-3 days at home. Never consecutive days. Start light gym  weight training activities."  Fredderick Phenix PT 03/27/2023 2:08 PM

## 2023-04-13 ENCOUNTER — Ambulatory Visit
Admission: RE | Admit: 2023-04-13 | Discharge: 2023-04-13 | Disposition: A | Discharge status: Home / Self Care | Source: Ambulatory Visit | Attending: Neurology | Admitting: Neurology

## 2023-04-13 DIAGNOSIS — G35 Multiple sclerosis: Secondary | ICD-10-CM

## 2023-04-13 MED ORDER — GADOPICLENOL 0.5 MMOL/ML IV SOLN
7.0000 mL | Freq: Once | INTRAVENOUS | Status: AC | PRN
  Administered 2023-04-13: 7 mL via INTRAVENOUS

## 2023-04-15 ENCOUNTER — Encounter: Payer: Self-pay | Admitting: Neurology

## 2023-04-16 ENCOUNTER — Other Ambulatory Visit: Payer: Self-pay

## 2023-04-19 ENCOUNTER — Other Ambulatory Visit (HOSPITAL_COMMUNITY): Payer: Self-pay

## 2023-04-19 ENCOUNTER — Other Ambulatory Visit: Payer: Self-pay

## 2023-04-19 NOTE — Progress Notes (Signed)
 Specialty Pharmacy Refill Coordination Note  Kelly Grant is a 58 y.o. female contacted today regarding refills of specialty medication(s) Fingolimod HCl   Patient requested [name] at [facility] at [facility] date: 04/23/23   Medication will be filled on 04/22/23.

## 2023-04-22 ENCOUNTER — Other Ambulatory Visit: Payer: Self-pay

## 2023-04-22 ENCOUNTER — Other Ambulatory Visit: Payer: Self-pay | Admitting: Nurse Practitioner

## 2023-04-22 DIAGNOSIS — M25552 Pain in left hip: Secondary | ICD-10-CM

## 2023-04-30 ENCOUNTER — Ambulatory Visit: Payer: Medicaid Other | Admitting: Dermatology

## 2023-05-06 ENCOUNTER — Encounter: Payer: Self-pay | Admitting: Advanced Practice Midwife

## 2023-05-06 ENCOUNTER — Ambulatory Visit: Admitting: Advanced Practice Midwife

## 2023-05-06 ENCOUNTER — Other Ambulatory Visit (HOSPITAL_COMMUNITY)
Admission: RE | Admit: 2023-05-06 | Discharge: 2023-05-06 | Disposition: A | Discharge status: Home / Self Care | Source: Ambulatory Visit | Attending: Advanced Practice Midwife | Admitting: Advanced Practice Midwife

## 2023-05-06 ENCOUNTER — Encounter: Payer: Self-pay | Admitting: Dermatology

## 2023-05-06 ENCOUNTER — Ambulatory Visit: Admitting: Dermatology

## 2023-05-06 VITALS — BP 133/79 | HR 68 | Ht 64.0 in | Wt 164.0 lb

## 2023-05-06 DIAGNOSIS — R87810 Cervical high risk human papillomavirus (HPV) DNA test positive: Secondary | ICD-10-CM | POA: Insufficient documentation

## 2023-05-06 DIAGNOSIS — Z01419 Encounter for gynecological examination (general) (routine) without abnormal findings: Secondary | ICD-10-CM

## 2023-05-06 DIAGNOSIS — R87610 Atypical squamous cells of undetermined significance on cytologic smear of cervix (ASC-US): Secondary | ICD-10-CM | POA: Diagnosis not present

## 2023-05-06 DIAGNOSIS — L820 Inflamed seborrheic keratosis: Secondary | ICD-10-CM | POA: Diagnosis not present

## 2023-05-06 DIAGNOSIS — Z1339 Encounter for screening examination for other mental health and behavioral disorders: Secondary | ICD-10-CM | POA: Diagnosis not present

## 2023-05-06 DIAGNOSIS — R87612 Low grade squamous intraepithelial lesion on cytologic smear of cervix (LGSIL): Secondary | ICD-10-CM | POA: Insufficient documentation

## 2023-05-06 NOTE — Progress Notes (Signed)
 58 y.o. GYN presents for AEX/PAP.  Declined STD screening.  Reports no concerns today.  Last Mammogram 03/20/23 Negative

## 2023-05-06 NOTE — Progress Notes (Signed)
   New Patient Visit   Subjective  Kelly Grant is a 58 y.o. female who presents for the following: spot of concern. No hx or family hx of cancer. Lesions on left neck and right shoulder, present for years, previously biopsied but returned; get irritated at times. She had some ISK cryoed at her last visit and they have all cleared.   The patient has spots, moles and lesions to be evaluated, some may be new or changing.  The following portions of the chart were reviewed this encounter and updated as appropriate: medications, allergies, medical history  Review of Systems:  No other skin or systemic complaints except as noted in HPI or Assessment and Plan.  Objective  Well appearing patient in no apparent distress; mood and affect are within normal limits.   A focused examination was performed of the following areas:   Neck  Relevant exam findings are noted in the Assessment and Plan.  Left Thigh - Anterior Inflamed stuck on papule  Assessment & Plan   INFLAMED SEBORRHEIC KERATOSIS- face, back-resolved Exam: Erythematous keratotic or waxy stuck-on papule or plaque.  Symptomatic, irritating, patient would like treated.  Benign-appearing.  Call clinic for new or changing lesions.   Prior to procedure, discussed risks of blister formation, small wound, skin dyspigmentation, or rare scar following treatment. Recommend Vaseline ointment to treated areas while healing.  Destruction Procedure Note Destruction method: cryotherapy   Informed consent: discussed and consent obtained   Lesion destroyed using liquid nitrogen: Yes   Outcome: patient tolerated procedure well with no complications   Post-procedure details: wound care instructions given   Locations: left thigh # of Lesions Treated: 1  Want to follow up in 4-8 weeks and make sure lesion has resolved.   INFLAMED SEBORRHEIC KERATOSIS Left Thigh - Anterior Destruction of lesion - Left Thigh - Anterior Complexity: simple    Destruction method: cryotherapy   Informed consent: discussed and consent obtained   Timeout:  patient name, date of birth, surgical site, and procedure verified Outcome: patient tolerated procedure well with no complications   Post-procedure details: wound care instructions given    Return in about 4 weeks (around 06/03/2023).  Phebe Brasil, Surg Tech III, am acting as scribe for Deneise Finlay, MD.   Documentation: I have reviewed the above documentation for accuracy and completeness, and I agree with the above.  Deneise Finlay, MD

## 2023-05-06 NOTE — Progress Notes (Signed)
   Subjective:     Kelly Grant is a 58 y.o. female here at [facility] for a routine exam.  Current complaints: none, abnormal Pap last year, concerned about HPV and how it is treated.  Personal and family health history reviewed: yes.  Do you have a primary care provider? yes Do you feel safe at home? yes  Flowsheet Row Office Visit from 05/06/2023 in [facility] for [facility] Total Score 0       Health Maintenance Due  Topic Date Due   Zoster Vaccines- Shingrix (1 of 2) Never done   COVID-19 Vaccine (4 - 2024-25 season) 09/23/2022     Risk factors for chronic health problems: Smoking: Alchohol/how much: Pt BMI: Body mass index is 28.15 kg/m.   Gynecologic History No LMP recorded. Patient is postmenopausal. Contraception: post menopausal status Last Pap: 2024. Results were: abnormal with ASCUS, HPV positive. Colpo with LSIL. Last mammogram: 03/20/23. Results were: normal  Obstetric History OB History  Gravida Para Term Preterm AB Living  3 2   1 2   SAB IAB Ectopic Multiple Live Births   1   2    # Outcome Date GA Lbr Len/2nd Weight Sex Type Anes PTL Lv  3 Para           2 Para           1 IAB              The following portions of the patient's history were reviewed and updated as appropriate: allergies, current medications, past family history, past medical history, past social history, past surgical history, and problem list.  Review of Systems Pertinent items noted in HPI and remainder of comprehensive ROS otherwise negative.    Objective:   Today's Vitals   05/06/23 0914  BP: 133/79  Pulse: 68  Weight: 164 lb (74.4 kg)  Height: 5\' 4"  (1.626 m)   Body mass index is 28.15 kg/m.  VS reviewed, nursing note reviewed,  Constitutional: well developed, well nourished, no distress HEENT: normocephalic, thyroid without enlargement or mass HEART: RRR, no murmurs rubs/gallops RESP: clear and equal to auscultation bilaterally  in all lobes  Breast Exam: exam performed: right breast normal without mass, skin or nipple changes or axillary nodes, left breast normal without mass, skin or nipple changes or axillary nodes Abdomen: soft Neuro: alert and oriented x 3 Skin: warm, dry Psych: affect normal Pelvic exam:  Performed: Cervix pink, visually closed, without lesion, scant white creamy discharge, vaginal walls and external genitalia normal Bimanual exam: Cervix 0/long/high, firm, anterior, neg CMT, uterus nontender, nonenlarged, adnexa without tenderness, enlargement, or mass        Assessment/Plan:   1. Encounter for annual routine gynecological examination (Primary)   2. ASCUS with positive high risk HPV cervical --ASCUS on Pap 03/2022, colpo with LSIL, repeat Pap today      Return in about 1 year (around 05/05/2024) for annual exam.   Arlester Bence, CNM 12:28 PM

## 2023-05-14 ENCOUNTER — Encounter: Payer: Self-pay | Admitting: Advanced Practice Midwife

## 2023-05-14 LAB — CYTOLOGY - PAP
Comment: NEGATIVE
Comment: NEGATIVE
Comment: NEGATIVE
Diagnosis: NEGATIVE
HPV 16: NEGATIVE
HPV 18 / 45: NEGATIVE
High risk HPV: POSITIVE — AB

## 2023-05-20 DIAGNOSIS — M25512 Pain in left shoulder: Secondary | ICD-10-CM | POA: Diagnosis not present

## 2023-05-20 DIAGNOSIS — M19011 Primary osteoarthritis, right shoulder: Secondary | ICD-10-CM | POA: Diagnosis not present

## 2023-05-21 ENCOUNTER — Other Ambulatory Visit: Payer: Self-pay | Admitting: Pharmacy Technician

## 2023-05-21 ENCOUNTER — Other Ambulatory Visit: Payer: Self-pay

## 2023-05-21 NOTE — Progress Notes (Signed)
 Specialty Pharmacy Refill Coordination Note  KESSLER AGATE is a 58 y.o. female contacted today regarding refills of specialty medication(s) Fingolimod  HCl   Patient requested Cranston Dk at [facility] at [facility] date: 05/29/23   Medication will be filled on 05/29/23.

## 2023-05-29 ENCOUNTER — Other Ambulatory Visit: Payer: Self-pay

## 2023-06-12 ENCOUNTER — Other Ambulatory Visit (HOSPITAL_COMMUNITY)
Admission: RE | Admit: 2023-06-12 | Discharge: 2023-06-12 | Disposition: A | Discharge status: Home / Self Care | Source: Ambulatory Visit | Attending: Obstetrics and Gynecology | Admitting: Obstetrics and Gynecology

## 2023-06-12 ENCOUNTER — Ambulatory Visit: Admitting: Obstetrics and Gynecology

## 2023-06-12 VITALS — BP 125/78 | HR 79 | Wt 162.0 lb

## 2023-06-12 DIAGNOSIS — R87618 Other abnormal cytological findings on specimens from cervix uteri: Secondary | ICD-10-CM | POA: Diagnosis not present

## 2023-06-12 DIAGNOSIS — R87810 Cervical high risk human papillomavirus (HPV) DNA test positive: Secondary | ICD-10-CM | POA: Diagnosis not present

## 2023-06-12 NOTE — Progress Notes (Signed)
 Pt is in office for Colpo, has no concerns today.

## 2023-06-12 NOTE — Progress Notes (Signed)
    GYNECOLOGY OFFICE COLPOSCOPY PROCEDURE NOTE  59 y.o. [id number] here for colposcopy for NILM/HPV positive (not 16/18/45) pap smear on 05/06/23. Discussed role for HPV in cervical dysplasia, need for surveillance.  Reviewed the risks of pain, bleeding, infection, inadequate sample. Patient gave informed written consent, time out was performed.  Placed in lithotomy position. Cervix viewed with speculum and colposcope after application of acetic acid.   Colposcopy adequate? No, incomplete visualization of SCJ  Acetowhite changes at 4 and 7 o'clock; corresponding biopsies obtained.  During collection of the 7 o'clock biopsy, the specimen fell off the instrument to the ground but was immediately identified and labeled and sent to pathology.  ECC was attempted but unable to be obtained due to cervical stenosis. All specimens were labeled and sent to pathology.  Chaperone was present during entire procedure.  Patient was given post procedure instructions.  Will follow up pathology and manage accordingly; patient will be contacted with results and recommendations. Reviewed possibility of additional procedures either in the office or in the operating room with anesthesia.    Marci Setter, MD, FACOG Obstetrician & Gynecologist, [facility] for [facility], [facility]

## 2023-06-14 LAB — SURGICAL PATHOLOGY

## 2023-06-18 ENCOUNTER — Ambulatory Visit: Payer: Self-pay | Admitting: Obstetrics and Gynecology

## 2023-06-18 ENCOUNTER — Ambulatory Visit: Admitting: Physician Assistant

## 2023-06-18 NOTE — Telephone Encounter (Signed)
 Telephone call to patient. No answer. Will send mychart message. CIN2 on biopsies. Needs LEEP. Will give her options of in office or in OR.

## 2023-06-19 ENCOUNTER — Telehealth: Payer: Self-pay

## 2023-06-20 ENCOUNTER — Other Ambulatory Visit (HOSPITAL_COMMUNITY): Payer: Self-pay

## 2023-06-23 DIAGNOSIS — F321 Major depressive disorder, single episode, moderate: Secondary | ICD-10-CM | POA: Diagnosis not present

## 2023-06-24 ENCOUNTER — Other Ambulatory Visit: Payer: Self-pay

## 2023-06-26 ENCOUNTER — Other Ambulatory Visit: Payer: Self-pay

## 2023-06-26 NOTE — Progress Notes (Signed)
 Specialty Pharmacy Refill Coordination Note  Kelly Grant is a 58 y.o. female contacted today regarding refills of specialty medication(s) Fingolimod  HCl   Patient requested [place] [facility] at [facility] at [facility] date: 07/03/23   Medication will be filled on 07/02/23.

## 2023-07-01 ENCOUNTER — Ambulatory Visit: Payer: Self-pay | Admitting: Nurse Practitioner

## 2023-07-02 ENCOUNTER — Other Ambulatory Visit: Payer: Self-pay

## 2023-07-04 DIAGNOSIS — F321 Major depressive disorder, single episode, moderate: Secondary | ICD-10-CM | POA: Diagnosis not present

## 2023-07-06 ENCOUNTER — Other Ambulatory Visit: Payer: Self-pay | Admitting: Nurse Practitioner

## 2023-07-06 DIAGNOSIS — B009 Herpesviral infection, unspecified: Secondary | ICD-10-CM

## 2023-07-06 DIAGNOSIS — F321 Major depressive disorder, single episode, moderate: Secondary | ICD-10-CM | POA: Diagnosis not present

## 2023-07-08 NOTE — Telephone Encounter (Signed)
 Please advise [facility]

## 2023-07-10 ENCOUNTER — Other Ambulatory Visit (HOSPITAL_COMMUNITY)
Admission: RE | Admit: 2023-07-10 | Discharge: 2023-07-10 | Disposition: A | Discharge status: Home / Self Care | Source: Ambulatory Visit | Attending: Obstetrics and Gynecology | Admitting: Obstetrics and Gynecology

## 2023-07-10 ENCOUNTER — Ambulatory Visit: Admitting: Obstetrics and Gynecology

## 2023-07-10 ENCOUNTER — Encounter: Payer: Self-pay | Admitting: Obstetrics and Gynecology

## 2023-07-10 VITALS — BP 134/79 | HR 70 | Ht 64.0 in | Wt 158.0 lb

## 2023-07-10 DIAGNOSIS — Z3202 Encounter for pregnancy test, result negative: Secondary | ICD-10-CM | POA: Diagnosis not present

## 2023-07-10 DIAGNOSIS — N871 Moderate cervical dysplasia: Secondary | ICD-10-CM

## 2023-07-10 DIAGNOSIS — N870 Mild cervical dysplasia: Secondary | ICD-10-CM | POA: Diagnosis not present

## 2023-07-10 LAB — POCT URINE PREGNANCY: Preg Test, Ur: NEGATIVE

## 2023-07-10 NOTE — Progress Notes (Signed)
 57 y.o. GYN presents for LEEP.  HSIL/CIN-2  UPT Negative

## 2023-07-10 NOTE — Progress Notes (Signed)
   GYNECOLOGY OFFICE PROCEDURE NOTE  Kelly Grant is a 58 y.o. [id number] here for LEEP. No GYN concerns. Pap smear and colposcopy history reviewed.    Pap NILM, HPV positive Colpo Biopsy CIN2 @ 4 and 7 o'clock ECC unable to be collected due to cervical stenosis  Risks, benefits, alternatives, and limitations of procedure explained to patient, including pain, bleeding, infection, failure to remove abnormal tissue and failure to cure dysplasia, need for repeat procedures, damage to pelvic organs, cervical incompetence.  Role of HPV,cervical dysplasia and need for close followup was empasized. Informed written consent was obtained. All questions were answered. Time out performed. Urine pregnancy test was Negative.  ??Procedure: The patient was placed in lithotomy position and the bivalved coated speculum was placed in the patient's vagina. A grounding pad placed on the patient. Lugol's was applied. Local anesthesia was administered via an intracervical block using 10 ml of 2% Lidocaine  with epinephrine . The suction was turned on and the Small 1X Fisher Cone Biopsy Excisor on 35 Watts of blended current was used however due to stenotic cervical canal, I was unable to adequately penetrate the tissue to excise a cone. With pressure on the cervix with the electrode, the cervix would collapse into the redundant vaginal walls. I barely skimmed the surface from 10-1 o'clock and obtained a superficial specimen. This was sent to pathology. There was no top-hat electrode available. Despite multiple attempts with use of tenaculum, attempt with os finder, I was unable to safely perform the LEEP and so the procedure was terminated. The cervix was hemostatic. The speculum was removed from the vagina. Specimens were sent to pathology. There were no complications apart from inability to perform the procedure.  ?The patient tolerated the procedure well. Postprocedure instructions were reviewed  We reviewed the  limitations of the procedure and I have recommended cold knife cone in the operating room. She verbalized her understanding. Request for surgery placed.   Marci Setter, MD, FACOG Obstetrician & Gynecologist, [facility] for [facility]

## 2023-07-11 ENCOUNTER — Other Ambulatory Visit: Payer: Self-pay

## 2023-07-15 LAB — SURGICAL PATHOLOGY

## 2023-07-16 ENCOUNTER — Ambulatory Visit: Payer: Self-pay | Admitting: Obstetrics and Gynecology

## 2023-07-25 ENCOUNTER — Other Ambulatory Visit (HOSPITAL_COMMUNITY): Payer: Self-pay

## 2023-07-29 ENCOUNTER — Telehealth: Payer: Self-pay

## 2023-07-29 NOTE — Telephone Encounter (Signed)
 Patient called me back to confirm that she's available for surgery w/ Dr. Abigail on 08/18/23 and will arrive for pre-op at 12:45 pm. I provided surgery details and pre-op instructions over the phone.

## 2023-07-29 NOTE — Telephone Encounter (Signed)
 I called patient to schedule surgery w/ Dr. Abigail on 08/13/23 at 2:45 pm /[facility]. I left patient a voicemail with details and requested a call back at [phone].

## 2023-07-30 ENCOUNTER — Other Ambulatory Visit: Payer: Self-pay

## 2023-07-31 ENCOUNTER — Other Ambulatory Visit: Payer: Self-pay | Admitting: Pharmacy Technician

## 2023-07-31 ENCOUNTER — Encounter (INDEPENDENT_AMBULATORY_CARE_PROVIDER_SITE_OTHER): Payer: Self-pay

## 2023-07-31 ENCOUNTER — Other Ambulatory Visit: Payer: Self-pay

## 2023-07-31 NOTE — Progress Notes (Signed)
 Specialty Pharmacy Refill Coordination Note  Kelly Grant is a 58 y.o. female contacted today regarding refills of specialty medication(s) Fingolimod  HCl   Patient requested (Patient-Rptd) Pickup at [facility] at [facility] date: (Patient-Rptd) 08/12/23   Medication will be filled on 08/09/23.

## 2023-08-08 ENCOUNTER — Ambulatory Visit: Payer: Self-pay | Admitting: Nurse Practitioner

## 2023-08-08 ENCOUNTER — Encounter: Payer: Self-pay | Admitting: Nurse Practitioner

## 2023-08-08 VITALS — BP 139/84 | HR 76 | Temp 98.3°F | Wt 156.4 lb

## 2023-08-08 DIAGNOSIS — R748 Abnormal levels of other serum enzymes: Secondary | ICD-10-CM | POA: Diagnosis not present

## 2023-08-08 NOTE — Progress Notes (Signed)
 Subjective   Patient ID: Kelly Grant, female    DOB: [birth year], 58 y.o.   MRN: [id number]  Chief Complaint  Patient presents with   Medical Management of Chronic Issues    Referring provider: Oley Bascom RAMAN, NP  Kelly Grant is a 58 y.o. female with Past Medical History: No date: Abnormal Pap smear     Comment:  ASC-H No date: Arthritis No date: Chlamydia No date: History of chicken pox No date: HSV infection No date: Multiple sclerosis (HCC) No date: Neuromuscular disorder (HCC)     Comment:  MS No date: Yeast infection   HPI  Patient presents today for follow-up visit.  Overall she has been doing well.  She does not need a refill today.  We do need to follow-up on elevated liver enzymes noted at last visit. Denies f/c/s, n/v/d, hemoptysis, PND, leg swelling Denies chest pain or edema     Allergies  Allergen Reactions   Macrobid [Nitrofurantoin Macrocrystal] Hives    Immunization History  Administered Date(s) Administered   Influenza,inj,Quad PF,6+ Mos 11/05/2013, 10/08/2014, 11/04/2016, 11/23/2017   Influenza-Unspecified 11/23/2017   PFIZER(Purple Top)SARS-COV-2 Vaccination 04/07/2019, 04/28/2019, 01/03/2020   Tdap 03/04/2012, 08/05/2013    Tobacco History: Social History   Tobacco Use  Smoking Status Former   Current packs/day: 0.00   Types: Cigarettes   Quit date: 01/23/2000   Years since quitting: 23.5  Smokeless Tobacco Never   Counseling given: Not Answered   Outpatient Encounter Medications as of 08/08/2023  Medication Sig   aspirin  EC 81 MG tablet Take 1 tablet (81 mg total) by mouth daily.   Boswellia-Glucosamine-Vit D (OSTEO BI-FLEX ONE PER DAY PO) Take 2 tablets by mouth daily.   calcium -vitamin D  (OSCAL WITH D) 500-200 MG-UNIT tablet Take 2 tablets by mouth in the morning.   carboxymethylcellulose (REFRESH PLUS) 0.5 % SOLN Place 1 drop into both eyes 2 (two) times daily as needed (dry/irritated eyes.).   Fingolimod  HCl 0.5 MG CAPS  TAKE ONE CAPSULE BY MOUTH ONCE DAILY. MAY TAKE WITH OR WITHOUT FOOD. STORE AT ROOM TEMPERATURE.   meloxicam  (MOBIC ) 15 MG tablet TAKE 1 TABLET(15 MG) BY MOUTH DAILY AS NEEDED FOR PAIN OR INFLAMMATION   Multiple Vitamin (MULTIVITAMIN) tablet Take 1 tablet by mouth daily.   Omega-3 Fatty Acids (FISH OIL) 1000 MG CAPS Take 1,000 mg by mouth daily.   rosuvastatin  (CRESTOR ) 10 MG tablet TAKE 1 TABLET(10 MG) BY MOUTH AT BEDTIME   sennosides-docusate sodium  (SENOKOT-S) 8.6-50 MG tablet Take 2 tablets by mouth daily.   valACYclovir  (VALTREX ) 500 MG tablet TAKE 1 TABLET(500 MG) BY MOUTH DAILY   baclofen  (LIORESAL ) 10 MG tablet Take 1 tablet (10 mg total) by mouth 3 (three) times daily as needed for muscle spasm   No facility-administered encounter medications on file as of 08/08/2023.    Review of Systems  Review of Systems  Constitutional: Negative.   HENT: Negative.    Cardiovascular: Negative.   Gastrointestinal: Negative.   Allergic/Immunologic: Negative.   Neurological: Negative.   Psychiatric/Behavioral: Negative.       Objective:   BP 139/84   Pulse 76   Temp 98.3 F (36.8 C) (Oral)   Wt 156 lb 6.4 oz (70.9 kg)   SpO2 95%   BMI 26.85 kg/m   Wt Readings from Last 5 Encounters:  08/08/23 156 lb 6.4 oz (70.9 kg)  07/10/23 158 lb (71.7 kg)  06/12/23 162 lb (73.5 kg)  05/06/23 164 lb (74.4 kg)  03/18/23  159 lb (72.1 kg)     Physical Exam Vitals and nursing note reviewed.  Constitutional:      General: She is not in acute distress.    Appearance: She is well-developed.  Cardiovascular:     Rate and Rhythm: Normal rate and regular rhythm.  Pulmonary:     Effort: Pulmonary effort is normal.     Breath sounds: Normal breath sounds.  Neurological:     Mental Status: She is alert and oriented to person, place, and time.       Assessment & Plan:   Elevated liver enzymes -     CBC -     Comprehensive metabolic panel with GFR     Return in about 6 months (around  02/08/2024).   Bascom GORMAN Borer, NP 08/08/2023

## 2023-08-09 ENCOUNTER — Other Ambulatory Visit: Payer: Self-pay

## 2023-08-09 ENCOUNTER — Encounter (HOSPITAL_COMMUNITY): Payer: Self-pay | Admitting: Obstetrics and Gynecology

## 2023-08-09 LAB — CBC
Hematocrit: 42 % (ref 34.0–46.6)
Hemoglobin: 13.2 g/dL (ref 11.1–15.9)
MCH: 31.8 pg (ref 26.6–33.0)
MCHC: 31.4 g/dL — ABNORMAL LOW (ref 31.5–35.7)
MCV: 101 fL — ABNORMAL HIGH (ref 79–97)
Platelets: 257 x10E3/uL (ref 150–450)
RBC: 4.15 x10E6/uL (ref 3.77–5.28)
RDW: 13.1 % (ref 11.7–15.4)
WBC: 3.1 x10E3/uL — ABNORMAL LOW (ref 3.4–10.8)

## 2023-08-09 LAB — COMPREHENSIVE METABOLIC PANEL WITH GFR
ALT: 41 IU/L — ABNORMAL HIGH (ref 0–32)
AST: 33 IU/L (ref 0–40)
Albumin: 4.2 g/dL (ref 3.8–4.9)
Alkaline Phosphatase: 92 IU/L (ref 44–121)
BUN/Creatinine Ratio: 21 (ref 9–23)
BUN: 16 mg/dL (ref 6–24)
Bilirubin Total: 0.4 mg/dL (ref 0.0–1.2)
CO2: 22 mmol/L (ref 20–29)
Calcium: 9.4 mg/dL (ref 8.7–10.2)
Chloride: 107 mmol/L — ABNORMAL HIGH (ref 96–106)
Creatinine, Ser: 0.78 mg/dL (ref 0.57–1.00)
Globulin, Total: 1.9 g/dL (ref 1.5–4.5)
Glucose: 77 mg/dL (ref 70–99)
Potassium: 4.3 mmol/L (ref 3.5–5.2)
Sodium: 143 mmol/L (ref 134–144)
Total Protein: 6.1 g/dL (ref 6.0–8.5)
eGFR: 87 mL/min/1.73 (ref >59)

## 2023-08-09 NOTE — Progress Notes (Signed)
 PCP - Bascom GORMAN Borer, NP  EKG - 10/04/22  Aspirin  Instructions: No instructions. Will stop today  Anesthesia review: Y   Patient verbally denies any shortness of breath, fever, cough and chest pain during phone call   -------------  SDW INSTRUCTIONS given:  Your procedure is scheduled on Tuesday, July 22nd.  Report to [facility] at 1245 P.M., and check in at the Admitting office.  Call this number if you have problems the morning of surgery:  [phone]   Remember:  Do not eat after midnight the night before your surgery  You may drink clear liquids until 1145 the morning of your surgery.   Clear liquids allowed are: Water , Non-Citrus Juices (without pulp), Carbonated Beverages, Clear Tea, Black Coffee Only, and Gatorade    Take these medicines the morning of surgery with A SIP OF WATER   Fingolimod   SENOKOT  valACYclovir  (VALTREX )  REFRESH-if needed   As of today, STOP taking any Aspirin  (unless otherwise instructed by your surgeon) Aleve , Naproxen , Ibuprofen, Motrin, Advil, Goody's, BC's, all herbal medications, fish oil, and all vitamins.                      Do not wear jewelry, make up, or nail polish            Do not wear lotions, powders, perfumes/colognes, or deodorant.            Do not shave 48 hours prior to surgery.  Men may shave face and neck.            Do not bring valuables to the hospital.            [facility] is not responsible for any belongings or valuables.  Do NOT Smoke (Tobacco/Vaping) 24 hours prior to your procedure If you use a CPAP at night, you may bring all equipment for your overnight stay.   Contacts, glasses, dentures or bridgework may not be worn into surgery.      For patients admitted to the hospital, discharge time will be determined by your treatment team.   Patients discharged the day of surgery will not be allowed to drive home, and someone needs to stay with them for 24 hours.    Special instructions:    - Preparing For Surgery  Before surgery, you can play an important role. Because skin is not sterile, your skin needs to be as free of germs as possible. You can reduce the number of germs on your skin by washing with CHG (chlorahexidine gluconate) Soap before surgery.  CHG is an antiseptic cleaner which kills germs and bonds with the skin to continue killing germs even after washing.    Oral Hygiene is also important to reduce your risk of infection.  Remember - BRUSH YOUR TEETH THE MORNING OF SURGERY WITH YOUR REGULAR TOOTHPASTE  Please do not use if you have an allergy to CHG or antibacterial soaps. If your skin becomes reddened/irritated stop using the CHG.  Do not shave (including legs and underarms) for at least 48 hours prior to first CHG shower. It is OK to shave your face.  Please follow these instructions carefully.   Shower the NIGHT BEFORE SURGERY and the MORNING OF SURGERY with DIAL Soap.   Pat yourself dry with a CLEAN TOWEL.  Wear CLEAN PAJAMAS to bed the night before surgery  Place CLEAN SHEETS on your bed the night of your first shower and DO NOT SLEEP  WITH PETS.   Day of Surgery: Please shower morning of surgery  Wear Clean/Comfortable clothing the morning of surgery Do not apply any deodorants/lotions.   Remember to brush your teeth WITH YOUR REGULAR TOOTHPASTE.   Questions were answered. Patient verbalized understanding of instructions.

## 2023-08-10 ENCOUNTER — Other Ambulatory Visit (HOSPITAL_COMMUNITY): Payer: Self-pay

## 2023-08-11 ENCOUNTER — Ambulatory Visit: Payer: Self-pay | Admitting: Nurse Practitioner

## 2023-08-12 NOTE — Anesthesia Preprocedure Evaluation (Addendum)
 Anesthesia Evaluation  Patient identified by MRN, date of birth, ID band Patient awake    Reviewed: Allergy & Precautions, NPO status , Patient's Chart, lab work & pertinent test results  Airway Mallampati: II  TM Distance: >3 FB Neck ROM: Full    Dental  (+) Teeth Intact, Dental Advisory Given   Pulmonary former smoker   Pulmonary exam normal breath sounds clear to auscultation       Cardiovascular negative cardio ROS Normal cardiovascular exam Rhythm:Regular Rate:Normal     Neuro/Psych Multiple sclerosis  Neuromuscular disease    GI/Hepatic negative GI ROS, Neg liver ROS,,,  Endo/Other  negative endocrine ROS    Renal/GU negative Renal ROS     Musculoskeletal  (+) Arthritis ,    Abdominal   Peds  Hematology negative hematology ROS (+)   Anesthesia Other Findings Day of surgery medications reviewed with the patient.  Reproductive/Obstetrics Dysplasia of cervix, high grade CIN 2                              Anesthesia Physical Anesthesia Plan  ASA: 3  Anesthesia Plan: General   Post-op Pain Management: Tylenol  PO (pre-op)*   Induction: Intravenous  PONV Risk Score and Plan: 4 or greater and Midazolam , Dexamethasone  and Ondansetron   Airway Management Planned: LMA  Additional Equipment:   Intra-op Plan:   Post-operative Plan: Extubation in OR  Informed Consent: I have reviewed the patients History and Physical, chart, labs and discussed the procedure including the risks, benefits and alternatives for the proposed anesthesia with the patient or authorized representative who has indicated his/her understanding and acceptance.     Dental advisory given  Plan Discussed with: CRNA  Anesthesia Plan Comments: (PAT note written 08/12/2023 by Laquasia Pincus, PA-C.  )         Anesthesia Quick Evaluation

## 2023-08-12 NOTE — Progress Notes (Signed)
 Anesthesia Chart Review: Kelly Grant  Case: [id number] Date/Time: 08/13/23 1453   Procedure: CONE BIOPSY, CERVIX   Anesthesia type: Choice   Diagnosis: Dysplasia of cervix, high grade CIN 2 [N87.1]   Pre-op diagnosis: CIN 2   [facility] RO[facility] 09 / [facility]   Surgeons: Abigail Rollo DASEN, MD       DISCUSSION: Patient is a 58 year old female scheduled for the above procedure. Recent ASCUS with high risk HPV on PCP. Notes suggest that due to cervical stenosis, attempts at LEEP were unsuccessful and now scheduled for the above procedure under anesthesia.  Other history includes former smoker (quit 01/23/00), Multiple Sclerosis, right shoulder AVN (s/p right reverse TSA 10/30/22).  She had primary care follow-up with Oley Bascom RAMAN, NP on 08/08/23. Labs rechecked for mildly elevated LFTs. On 09/12/22 AST 44 and ALT 32. On 03/18/23, AST 88 and ALT 54. On 08/08/23, AST normal at 33 and ALT minimally elevated at 41. H/H stable at 13.2/42.0. PLT count normal at 257K. Negative Hep B Surface AG and nonreactive Hep C Virus Ab & HIV on 04/13/22.   MS felt to be overall stable on [place]  as of 03/18/23 visit with Dr. Onita and by 04/13/23 brain MRI results.   Anesthesia team to evaluate on the day of surgery. For urine pregnancy test on arrival. She had CMP and CBC on 7/1/725 through primary care.   VS:  Wt Readings from Last 3 Encounters:  08/08/23 70.9 kg  07/10/23 71.7 kg  06/12/23 73.5 kg   BP Readings from Last 3 Encounters:  08/08/23 139/84  07/10/23 134/79  06/12/23 125/78   Pulse Readings from Last 3 Encounters:  08/08/23 76  07/10/23 70  06/12/23 79     PROVIDERS: Oley Bascom RAMAN, NP is PCP  Onita Duos, MD is neurologist.    LABS: Most recent lab results in [facility] include: Lab Results  Component Value Date   WBC 3.1 (L) 08/08/2023   HGB 13.2 08/08/2023   HCT 42.0 08/08/2023   PLT 257 08/08/2023   GLUCOSE 77 08/08/2023   CHOL 204 (H) 12/31/2022   TRIG 141 12/31/2022   HDL 62  12/31/2022   LDLCALC 117 (H) 12/31/2022   ALT 41 (H) 08/08/2023   AST 33 08/08/2023   NA 143 08/08/2023   K 4.3 08/08/2023   CL 107 (H) 08/08/2023   CREATININE 0.78 08/08/2023   BUN 16 08/08/2023   CO2 22 08/08/2023   TSH 1.310 03/18/2023   HGBA1C 5.8 (H) 12/31/2022     IMAGES: Brain MRI 04/13/23: IMPRESSION: MRI scan of the brain with and without contrast showing multiple scattered bilateral periventricular, juxtacortical, corpus callosum and brainstem T2/FLAIR white matter hyperintensities in the right neck distribution compatible with chronic demyelinating disease. Presence of T1 black. Mild cortical atrophy indicates chronic disease. No enhancing lesions are noted. Overall probably no significant change compared with previous MRI from 08/01/2019.   EKG: 10/01/22: NSR   CV: N/A  Past Medical History:  Diagnosis Date   Abnormal Pap smear    ASC-H   Arthritis    Chlamydia    History of chicken pox    HSV infection    Multiple sclerosis (HCC)    Neuromuscular disorder (HCC)    MS   Yeast infection     Past Surgical History:  Procedure Laterality Date   CARPAL TUNNEL RELEASE Left    in addition to wrist fracture surgery   CESAREAN SECTION     x2  COLONOSCOPY  2016   DJ-MAC-movi(exc)-TA   INCISION AND DRAINAGE ABSCESS Left    left buttock   POLYPECTOMY  2016   TA   REVERSE SHOULDER ARTHROPLASTY Right 10/30/2022   Procedure: REVERSE SHOULDER ARTHROPLASTY;  Surgeon: Josefina Chew, MD;  Location: [facility] ORS;  Service: Orthopedics;  Laterality: Right;   TUBAL LIGATION      MEDICATIONS: No current facility-administered medications for this encounter.    aspirin  EC 81 MG tablet   Boswellia-Glucosamine-Vit D (OSTEO BI-FLEX ONE PER DAY PO)   calcium -vitamin D  (OSCAL WITH D) 500-200 MG-UNIT tablet   carboxymethylcellulose (REFRESH PLUS) 0.5 % SOLN   Fingolimod  HCl 0.5 MG CAPS   meloxicam  (MOBIC ) 15 MG tablet   Multiple Vitamin (MULTIVITAMIN) tablet   Omega-3 Fatty  Acids (FISH OIL) 1000 MG CAPS   rosuvastatin  (CRESTOR ) 10 MG tablet   sennosides-docusate sodium  (SENOKOT-S) 8.6-50 MG tablet   valACYclovir  (VALTREX ) 500 MG tablet   baclofen  (LIORESAL ) 10 MG tablet    Isaiah Ruder, PA-C Surgical Short Stay/Anesthesiology [facility] Phone [phone] [facility] Phone [phone] 08/12/2023 10:54 AM

## 2023-08-13 ENCOUNTER — Other Ambulatory Visit: Payer: Self-pay

## 2023-08-13 ENCOUNTER — Encounter (HOSPITAL_COMMUNITY)
Admission: RE | Disposition: A | Discharge status: Home / Self Care | Payer: Self-pay | Source: Home / Self Care | Attending: Obstetrics and Gynecology

## 2023-08-13 ENCOUNTER — Ambulatory Visit (HOSPITAL_COMMUNITY): Payer: Self-pay | Admitting: Vascular Surgery

## 2023-08-13 ENCOUNTER — Ambulatory Visit (HOSPITAL_BASED_OUTPATIENT_CLINIC_OR_DEPARTMENT_OTHER): Payer: Self-pay | Admitting: Vascular Surgery

## 2023-08-13 ENCOUNTER — Encounter (HOSPITAL_COMMUNITY): Payer: Self-pay | Admitting: Obstetrics and Gynecology

## 2023-08-13 ENCOUNTER — Ambulatory Visit (HOSPITAL_COMMUNITY)
Admission: RE | Admit: 2023-08-13 | Discharge: 2023-08-13 | Disposition: A | Discharge status: Home / Self Care | Attending: Obstetrics and Gynecology | Admitting: Obstetrics and Gynecology

## 2023-08-13 DIAGNOSIS — G35 Multiple sclerosis: Secondary | ICD-10-CM | POA: Insufficient documentation

## 2023-08-13 DIAGNOSIS — N871 Moderate cervical dysplasia: Secondary | ICD-10-CM | POA: Diagnosis not present

## 2023-08-13 DIAGNOSIS — N952 Postmenopausal atrophic vaginitis: Secondary | ICD-10-CM

## 2023-08-13 DIAGNOSIS — Z87891 Personal history of nicotine dependence: Secondary | ICD-10-CM | POA: Diagnosis not present

## 2023-08-13 DIAGNOSIS — Z01818 Encounter for other preprocedural examination: Secondary | ICD-10-CM

## 2023-08-13 DIAGNOSIS — N870 Mild cervical dysplasia: Secondary | ICD-10-CM | POA: Diagnosis not present

## 2023-08-13 DIAGNOSIS — E785 Hyperlipidemia, unspecified: Secondary | ICD-10-CM | POA: Diagnosis not present

## 2023-08-13 DIAGNOSIS — E119 Type 2 diabetes mellitus without complications: Secondary | ICD-10-CM

## 2023-08-13 HISTORY — PX: CERVICAL CONIZATION W/BX: SHX1330

## 2023-08-13 SURGERY — CONE BIOPSY, CERVIX
Anesthesia: General | Site: Vagina

## 2023-08-13 MED ORDER — LIDOCAINE-EPINEPHRINE 1 %-1:100000 IJ SOLN
INTRAMUSCULAR | Status: DC | PRN
  Administered 2023-08-13: 10 mL

## 2023-08-13 MED ORDER — ACETAMINOPHEN 500 MG PO TABS
1000.0000 mg | ORAL_TABLET | ORAL | Status: AC
  Administered 2023-08-13: 1000 mg via ORAL
  Filled 2023-08-13: qty 2

## 2023-08-13 MED ORDER — LACTATED RINGERS IV SOLN: INTRAVENOUS | Status: DC

## 2023-08-13 MED ORDER — IBUPROFEN 600 MG PO TABS: 600.0000 mg | ORAL_TABLET | Freq: Four times a day (QID) | ORAL | 0 refills | Status: AC | PRN

## 2023-08-13 MED ORDER — DEXAMETHASONE SODIUM PHOSPHATE 10 MG/ML IJ SOLN
INTRAMUSCULAR | Status: DC | PRN
  Administered 2023-08-13: 10 mg via INTRAVENOUS

## 2023-08-13 MED ORDER — FENTANYL CITRATE (PF) 250 MCG/5ML IJ SOLN
INTRAMUSCULAR | Status: AC
  Filled 2023-08-13: qty 5

## 2023-08-13 MED ORDER — PROPOFOL 10 MG/ML IV BOLUS
INTRAVENOUS | Status: AC
  Filled 2023-08-13: qty 20

## 2023-08-13 MED ORDER — MONSELS FERRIC SUBSULFATE EX SOLN
CUTANEOUS | Status: DC | PRN
  Administered 2023-08-13: 1 via TOPICAL

## 2023-08-13 MED ORDER — HEMOSTATIC AGENTS (NO CHARGE) OPTIME
TOPICAL | Status: DC | PRN
  Administered 2023-08-13: 1 via TOPICAL

## 2023-08-13 MED ORDER — CHLORHEXIDINE GLUCONATE 0.12 % MT SOLN
OROMUCOSAL | Status: AC
  Administered 2023-08-13: 15 mL via OROMUCOSAL
  Filled 2023-08-13: qty 15

## 2023-08-13 MED ORDER — PROPOFOL 10 MG/ML IV BOLUS
INTRAVENOUS | Status: DC | PRN
  Administered 2023-08-13: 140 mg via INTRAVENOUS

## 2023-08-13 MED ORDER — ORAL CARE MOUTH RINSE: 15.0000 mL | Freq: Once | OROMUCOSAL | Status: AC

## 2023-08-13 MED ORDER — ONDANSETRON HCL 4 MG/2ML IJ SOLN
INTRAMUSCULAR | Status: DC | PRN
  Administered 2023-08-13: 4 mg via INTRAVENOUS

## 2023-08-13 MED ORDER — KETOROLAC TROMETHAMINE 30 MG/ML IJ SOLN
INTRAMUSCULAR | Status: DC | PRN
  Administered 2023-08-13: 30 mg via INTRAVENOUS

## 2023-08-13 MED ORDER — ONDANSETRON HCL 4 MG/2ML IJ SOLN
INTRAMUSCULAR | Status: AC
  Filled 2023-08-13: qty 2

## 2023-08-13 MED ORDER — DEXAMETHASONE SODIUM PHOSPHATE 10 MG/ML IJ SOLN
INTRAMUSCULAR | Status: AC
  Filled 2023-08-13: qty 1

## 2023-08-13 MED ORDER — LIDOCAINE 2% (20 MG/ML) 5 ML SYRINGE
INTRAMUSCULAR | Status: AC
  Filled 2023-08-13: qty 5

## 2023-08-13 MED ORDER — FENTANYL CITRATE (PF) 100 MCG/2ML IJ SOLN
INTRAMUSCULAR | Status: DC | PRN
  Administered 2023-08-13 (×2): 50 ug via INTRAVENOUS

## 2023-08-13 MED ORDER — MIDAZOLAM HCL 2 MG/2ML IJ SOLN
INTRAMUSCULAR | Status: AC
  Filled 2023-08-13: qty 2

## 2023-08-13 MED ORDER — CHLORHEXIDINE GLUCONATE 0.12 % MT SOLN: 15.0000 mL | Freq: Once | OROMUCOSAL | Status: AC

## 2023-08-13 MED ORDER — ONDANSETRON HCL 4 MG/2ML IJ SOLN: 4.0000 mg | Freq: Once | INTRAMUSCULAR | Status: DC | PRN

## 2023-08-13 MED ORDER — IODINE STRONG (LUGOLS) 5 % PO SOLN
ORAL | Status: DC | PRN
  Administered 2023-08-13: 10 mL

## 2023-08-13 MED ORDER — DOCUSATE SODIUM 100 MG PO CAPS: 100.0000 mg | ORAL_CAPSULE | Freq: Two times a day (BID) | ORAL | 0 refills | Status: DC

## 2023-08-13 MED ORDER — AMISULPRIDE (ANTIEMETIC) 5 MG/2ML IV SOLN: 10.0000 mg | Freq: Once | INTRAVENOUS | Status: DC | PRN

## 2023-08-13 MED ORDER — FENTANYL CITRATE (PF) 100 MCG/2ML IJ SOLN: 25.0000 ug | INTRAMUSCULAR | Status: DC | PRN

## 2023-08-13 MED ORDER — LIDOCAINE HCL (CARDIAC) PF 100 MG/5ML IV SOSY
PREFILLED_SYRINGE | INTRAVENOUS | Status: DC | PRN
  Administered 2023-08-13 (×2): 80 mg via INTRAVENOUS

## 2023-08-13 MED ORDER — MIDAZOLAM HCL 2 MG/2ML IJ SOLN
INTRAMUSCULAR | Status: DC | PRN
  Administered 2023-08-13: 2 mg via INTRAVENOUS

## 2023-08-13 MED ORDER — PHENYLEPHRINE HCL (PRESSORS) 10 MG/ML IV SOLN
INTRAVENOUS | Status: DC | PRN
  Administered 2023-08-13: 80 ug via INTRAVENOUS

## 2023-08-13 SURGICAL SUPPLY — 20 items
APPLICATOR COTTON TIP 6 STRL (MISCELLANEOUS) IMPLANT
BLADE SURG 11 STRL SS (BLADE) ×1 IMPLANT
CATH ROBINSON RED A/P 14FR (CATHETERS) IMPLANT
CNTNR URN SCR LID CUP LEK RST (MISCELLANEOUS) IMPLANT
ELECT BALL LEEP 3MM BLK (ELECTRODE) IMPLANT
ELECT BALL LEEP 5MM RED (ELECTRODE) IMPLANT
ELECTRODE REM PT RTRN 9FT ADLT (ELECTROSURGICAL) IMPLANT
GAUZE 4X4 16PLY ~~LOC~~+RFID DBL (SPONGE) IMPLANT
GLOVE BIO SURGEON STRL SZ 6.5 (GLOVE) ×1 IMPLANT
GLOVE BIOGEL PI IND STRL 6.5 (GLOVE) ×1 IMPLANT
GOWN STRL REUS W/ TWL LRG LVL3 (GOWN DISPOSABLE) ×1 IMPLANT
NS IRRIG 1000ML POUR BTL (IV SOLUTION) ×1 IMPLANT
PACK VAGINAL WOMENS (CUSTOM PROCEDURE TRAY) ×1 IMPLANT
PAD OB MATERNITY 4.3X12.25 (PERSONAL CARE ITEMS) ×1 IMPLANT
SCOPETTES 8 STERILE (MISCELLANEOUS) ×1 IMPLANT
SPONGE SURGIFOAM ABS GEL 12-7 (HEMOSTASIS) IMPLANT
SUT VIC AB 0 CT1 27XBRD ANBCTR (SUTURE) IMPLANT
SUT VIC AB 2-0 CT1 18 (SUTURE) ×2 IMPLANT
SUT VIC AB 2-0 CT1 TAPERPNT 27 (SUTURE) IMPLANT
TOWEL GREEN STERILE (TOWEL DISPOSABLE) ×1 IMPLANT

## 2023-08-13 NOTE — Anesthesia Postprocedure Evaluation (Signed)
 Anesthesia Post Note  Patient: Kelly Grant  Procedure(s) Performed: CONE BIOPSY, CERVIX (Vagina )     Patient location during evaluation: PACU Anesthesia Type: General Level of consciousness: awake and alert Pain management: pain level controlled Vital Signs Assessment: post-procedure vital signs reviewed and stable Respiratory status: spontaneous breathing, nonlabored ventilation, respiratory function stable and patient connected to nasal cannula oxygen Cardiovascular status: blood pressure returned to baseline and stable Postop Assessment: no apparent nausea or vomiting Anesthetic complications: no   No notable events documented.  Last Vitals:  Vitals:   08/13/23 1545 08/13/23 1600  BP: 133/69 131/76  Pulse: 73 66  Resp: 13 12  Temp: 36.4 C   SpO2: 99% 94%    Last Pain:  Vitals:   08/13/23 1545  TempSrc:   PainSc: 0-No pain                 Garnette FORBES Skillern

## 2023-08-13 NOTE — Transfer of Care (Signed)
 Immediate Anesthesia Transfer of Care Note  Patient: Kelly Grant  Procedure(s) Performed: CONE BIOPSY, CERVIX (Vagina )  Patient Location: PACU  Anesthesia Type:General  Level of Consciousness: awake, alert , and patient cooperative  Airway & Oxygen Therapy: Patient Spontanous Breathing and Patient connected to face mask oxygen  Post-op Assessment: Report given to RN, Post -op Vital signs reviewed and stable, Patient moving all extremities, and Patient moving all extremities X 4  Post vital signs: Reviewed and stable  Last Vitals:  Vitals Value Taken Time  BP 133/69 08/13/23 15:45  Temp 36.4 C 08/13/23 15:45  Pulse 71 08/13/23 15:47  Resp 13 08/13/23 15:47  SpO2 99 % 08/13/23 15:47  Vitals shown include unfiled device data.  Last Pain:  Vitals:   08/13/23 1338  TempSrc:   PainSc: 0-No pain         Complications: No notable events documented.

## 2023-08-13 NOTE — H&P (Signed)
 Preoperative History and Physical  Kelly Grant is a 58 y.o. [id number] here for surgical management of CIN2.   No significant preoperative concerns.  LEEP was attempted in the office however due to stenotic cervical os was unable to be completed successfully. Options were reviewed for management and the patient opted to proceed with management in the OR with [facility].  Proposed surgery: cold knife cone, endocervical curettage  Recent abnormal pap history: 05/2023: colpo with CIN2 @ 4 and 7 o'clock 04/2023: NILM/HPV+ other 04/2022: colpo with LSIL 03/2022: ASCUS/HPV + other 03/2021: NILM 03/2020: ASCUS/HPV + other 09/2019: Colpo with CIN1 03/2019: ASCUS-H/HPV + other  Past Medical History:  Diagnosis Date   Abnormal Pap smear    ASC-H   Arthritis    Chlamydia    History of chicken pox    HSV infection    Multiple sclerosis (HCC)    Neuromuscular disorder (HCC)    MS   Yeast infection    Past Surgical History:  Procedure Laterality Date   CARPAL TUNNEL RELEASE Left    in addition to wrist fracture surgery   CESAREAN SECTION     x2   COLONOSCOPY  2016   DJ-MAC-movi(exc)-TA   INCISION AND DRAINAGE ABSCESS Left    left buttock   POLYPECTOMY  2016   TA   REVERSE SHOULDER ARTHROPLASTY Right 10/30/2022   Procedure: REVERSE SHOULDER ARTHROPLASTY;  Surgeon: Josefina Chew, MD;  Location: [facility];  Service: Orthopedics;  Laterality: Right;   TUBAL LIGATION     OB History  Gravida Para Term Preterm AB Living  3 2   1 2   SAB IAB Ectopic Multiple Live Births   1   2    # Outcome Date GA Lbr Len/2nd Weight Sex Type Anes PTL Lv  3 Para           2 Para           1 IAB           Patient denies any other pertinent gynecologic issues.   No current facility-administered medications on file prior to encounter.   Current Outpatient Medications on File Prior to Encounter  Medication Sig Dispense Refill   aspirin  EC 81 MG tablet Take 1 tablet (81 mg total) by mouth daily. 30 tablet 11    Boswellia-Glucosamine-Vit D (OSTEO BI-FLEX ONE PER DAY PO) Take 2 tablets by mouth daily.     calcium -vitamin D  (OSCAL WITH D) 500-200 MG-UNIT tablet Take 2 tablets by mouth in the morning.     carboxymethylcellulose (REFRESH PLUS) 0.5 % SOLN Place 1 drop into both eyes 2 (two) times daily as needed (dry/irritated eyes.).     Fingolimod  HCl 0.5 MG CAPS TAKE ONE CAPSULE BY MOUTH ONCE DAILY. MAY TAKE WITH OR WITHOUT FOOD. STORE AT ROOM TEMPERATURE. 90 capsule 3   meloxicam  (MOBIC ) 15 MG tablet TAKE 1 TABLET(15 MG) BY MOUTH DAILY AS NEEDED FOR PAIN OR INFLAMMATION 30 tablet 0   Multiple Vitamin (MULTIVITAMIN) tablet Take 1 tablet by mouth daily. 30 tablet 3   Omega-3 Fatty Acids (FISH OIL) 1000 MG CAPS Take 1,000 mg by mouth daily.     rosuvastatin  (CRESTOR ) 10 MG tablet TAKE 1 TABLET(10 MG) BY MOUTH AT BEDTIME 90 tablet 1   sennosides-docusate sodium  (SENOKOT-S) 8.6-50 MG tablet Take 2 tablets by mouth daily.     valACYclovir  (VALTREX ) 500 MG tablet TAKE 1 TABLET(500 MG) BY MOUTH DAILY 90 tablet 0   baclofen  (LIORESAL ) 10 MG  tablet Take 1 tablet (10 mg total) by mouth 3 (three) times daily as needed for muscle spasm (Patient not taking: Reported on 08/09/2023) 50 tablet 0   Allergies  Allergen Reactions   Macrobid [Nitrofurantoin Macrocrystal] Hives    Social History:   reports that she quit smoking about 23 years ago. Her smoking use included cigarettes. She has never used smokeless tobacco. She reports current alcohol use of about 1.0 standard drink of alcohol per week. She reports that she does not use drugs.  Family History  Problem Relation Age of Onset   Colon polyps Mother    Kidney failure Mother    Diabetes Mother    Colon cancer Neg Hx    Esophageal cancer Neg Hx    Rectal cancer Neg Hx    Stomach cancer Neg Hx     Review of Systems: Pertinent items noted in HPI and remainder of comprehensive ROS otherwise negative.  PHYSICAL EXAM: Blood pressure (!) 143/88, pulse 75,  temperature (!) 97.5 F (36.4 C), temperature source Axillary, resp. rate 18, height 5' 4 (1.626 m), weight 70.8 kg, SpO2 96%. CONSTITUTIONAL: Well-developed, well-nourished female in no acute distress.  HENT:  Normocephalic, atraumatic, External right and left ear normal. Oropharynx is clear and moist EYES: Conjunctivae and EOM are normal. Pupils are equal, round, and reactive to light. No scleral icterus.  NECK: Normal range of motion, supple, no masses SKIN: Skin is warm and dry. No rash noted. Not diaphoretic. No erythema. No pallor. NEUROLOGIC: Alert and oriented to person, place, and time. Normal reflexes, muscle tone coordination. No cranial nerve deficit noted. PSYCHIATRIC: Normal mood and affect. Normal behavior. Normal judgment and thought content. CARDIOVASCULAR: Normal heart rate noted, regular rhythm RESPIRATORY: Effort and breath sounds normal, no problems with respiration noted ABDOMEN: Soft, nontender, nondistended. PELVIC: Deferred MUSCULOSKELETAL: Normal range of motion. No edema and no tenderness.    Labs: Results for orders placed or performed in visit on 08/08/23 (from the past 2 weeks)  CBC   Collection Time: 08/08/23  2:39 PM  Result Value Ref Range   WBC 3.1 (L) 3.4 - 10.8 x10E3/uL   RBC 4.15 3.77 - 5.28 x10E6/uL   Hemoglobin 13.2 11.1 - 15.9 g/dL   Hematocrit 57.9 65.9 - 46.6 %   MCV 101 (H) 79 - 97 fL   MCH 31.8 26.6 - 33.0 pg   MCHC 31.4 (L) 31.5 - 35.7 g/dL   RDW 86.8 88.2 - 84.5 %   Platelets 257 150 - 450 x10E3/uL  Comprehensive metabolic panel with GFR   Collection Time: 08/08/23  2:39 PM  Result Value Ref Range   Glucose 77 70 - 99 mg/dL   BUN 16 6 - 24 mg/dL   Creatinine, Ser 9.21 0.57 - 1.00 mg/dL   eGFR 87 >40 fO/fpw/8.26   BUN/Creatinine Ratio 21 9 - 23   Sodium 143 134 - 144 mmol/L   Potassium 4.3 3.5 - 5.2 mmol/L   Chloride 107 (H) 96 - 106 mmol/L   CO2 22 20 - 29 mmol/L   Calcium  9.4 8.7 - 10.2 mg/dL   Total Protein 6.1 6.0 - 8.5 g/dL    Albumin 4.2 3.8 - 4.9 g/dL   Globulin, Total 1.9 1.5 - 4.5 g/dL   Bilirubin Total 0.4 0.0 - 1.2 mg/dL   Alkaline Phosphatase 92 44 - 121 IU/L   AST 33 0 - 40 IU/L   ALT 41 (H) 0 - 32 IU/L    Imaging Studies: No results found.  Assessment: Active Problems:   Dysplasia of cervix, high grade CIN 2   Plan: Cold knife cone, endocervical curettage  The risks of surgery were discussed in detail with the patient including but not limited to: bleeding which may require transfusion or reoperation; infection which may require antibiotics; injury to surrounding organs which may involve bowel, bladder, vagina; need for additional procedures, thromboembolic phenomenon, surgical site problems and other postoperative/anesthesia complications including MI, CVA or death. We reviewed that pathology result will dictate whether further procedures are needed and what the appropriate management at follow up will be.  Rollo ONEIDA Bring, MD, FACOG Obstetrician & Gynecologist, [facility] for [facility], [facility]

## 2023-08-13 NOTE — Discharge Instructions (Addendum)
 DO NOT TAKE MOBIC /MELOXICAM  AT THE SAME TIME AS IBUPROFEN . YOU CAN TAKE IBUPROFEN  FOR ANY POSTOPERATIVE PAIN OR CRAMPING YOU ARE HAVING  You can expect to have vaginal bleeding mixed with brown-black grainy discharge. There is also an absorbable packing in your cervix with stitches that may fall out.

## 2023-08-13 NOTE — Anesthesia Procedure Notes (Signed)
 Procedure Name: LMA Insertion Date/Time: 08/13/2023 2:55 PM  Performed by: Mathew Bernardino RAMAN, RNPre-anesthesia Checklist: Patient identified, Patient being monitored, Emergency Drugs available, Timeout performed and Suction available Patient Re-evaluated:Patient Re-evaluated prior to induction Oxygen Delivery Method: Circle System Utilized Preoxygenation: Pre-oxygenation with 100% oxygen Induction Type: IV induction Ventilation: Mask ventilation without difficulty LMA: LMA inserted LMA Size: 4.0 Number of attempts: 1 Placement Confirmation: positive ETCO2 and breath sounds checked- equal and bilateral

## 2023-08-13 NOTE — Op Note (Signed)
 08/13/23  PREOPERATIVE DIAGNOSIS: CIN2  POSTOPERATIVE DIAGNOSIS: Same  PROCEDURE: Cold knife cone, endocervical curettage  SURGEON: Dr. Rollo T. Mahlet Jergens, MD  ASSIST: none  ANESTHESIA: General-LMA, intracervical block of 10 ml of 1% lidocaine  with epinephrine   IVF: 200 ml  UOP: 100 ml clear urine  EBL: 15 ml  SPECIMENS: Cervical cone, endocervical curettings  FINDINGS: Atrophic vagina and cervix. Cervix small and nearly flush with vagina. Cervical os stenotic.  COMPLICATIONS: none  PROCEDURE DETAILS: The patient was taken to the operating room. Anesthesia was induced without complication. She was positioned in lithotomy. A timeout was performed to confirm the correct patient and procedure. Patient was then prepared and draped in the normal sterile fashion. Small bleeding was noted from the cervix after the prep. A single tooth tenaculum was applied at 12 o'clock. Lugol's was applied. The cervix was injected with 1% lidocaine  with epinephrine  for a total of 10 ml. Stay sutures performed on either side of the cervix at 3 and 9 o'clock with 2-0 vicryl. A small cervical cone was then removed using the 11 blade scalpel. Specimen open at 6 o'clock. An ECC was collected. The cervical bed was cauterized with the bovie ball at 35 watts coagulation. Cervix was hemostatic. Gel foam was inserted into the cone bed and the sutures then tied down and then together. Monsels was applied to friable tissue edges. The cervix was noted to be hemostatic. The bladder was drained with an in and out catheter.  All instruments were removed from the vagina. Counts correct x2.  Patient taken to recovery room in stable condition.  Rollo ONEIDA Bring, MD, FACOG Obstetrician & Gynecologist, [facility] for [facility], [facility]

## 2023-08-14 ENCOUNTER — Encounter (HOSPITAL_COMMUNITY): Payer: Self-pay | Admitting: Obstetrics and Gynecology

## 2023-08-16 DIAGNOSIS — M79645 Pain in left finger(s): Secondary | ICD-10-CM | POA: Diagnosis not present

## 2023-08-16 LAB — SURGICAL PATHOLOGY

## 2023-08-27 ENCOUNTER — Ambulatory Visit: Payer: Self-pay | Admitting: Obstetrics and Gynecology

## 2023-09-04 ENCOUNTER — Other Ambulatory Visit: Payer: Self-pay

## 2023-09-06 ENCOUNTER — Other Ambulatory Visit: Payer: Self-pay

## 2023-09-11 ENCOUNTER — Ambulatory Visit: Admitting: Obstetrics and Gynecology

## 2023-09-11 VITALS — BP 143/78 | HR 70 | Wt 163.0 lb

## 2023-09-11 DIAGNOSIS — Z09 Encounter for follow-up examination after completed treatment for conditions other than malignant neoplasm: Secondary | ICD-10-CM

## 2023-09-11 NOTE — Progress Notes (Signed)
   ESTABLISHED GYNECOLOGY VISIT Chief Complaint  Patient presents with   Routine Post Op    Subjective:  Kelly Grant is a 58 y.o. [id number] presenting for postop visit after cold knife cone on 08/13/23.  She denies abnormal discharge, heavy bleeding or pelvic pain.  Pathology: FINAL MICROSCOPIC DIAGNOSIS:   A. CERVIX, CONE BIOPSY:       Focal low-grade squamous intraepithelial lesion (LSIL / CIN1).       No evidence of high-grade dysplasia.       Surgical resection margins are negative for dysplasia.   B. ENDOCERVIX, CURETTAGE:       Endocervical glands without significant diagnostic alteration.       Negative for hyperplasia, atypia or malignancy.    Review of Systems:   Pertinent items are noted in HPI  Pertinent History Reviewed:  Reviewed past medical,surgical, social and family history.  Reviewed problem list, medications and allergies.  Objective:   Vitals:   09/11/23 0837  BP: (!) 143/78  Pulse: 70  Weight: 163 lb (73.9 kg)   Physical Examination:   General appearance - well appearing, and in no distress  Mental status - alert, oriented to person, place, and time  Psych:  normal mood and affect  Skin - warm and dry, normal color, no suspicious lesions noted  Abdomen - soft, nontender, nondistended, no masses or organomegaly  Pelvic -  VULVA: normal appearing vulva with no masses, tenderness or lesions   VAGINA: normal appearing vagina with normal color and discharge, no lesions   CERVIX: cervix is healing with granulation tissue and suture in place that appears to be dissolving  Extremities:  No swelling or varicosities noted  Chaperone present for exam  Assessment and Plan:  1. Postop check (Primary) Reviewed pathology in detail with the patient. Initially had discussed repeat pap/HPV in 6 months, however discussed that colpo/ECC/pap/HPV in 6 months would be the most conservative approach to ensure thorough evaluation that no high grade lesion was  missed on the cone.    Return in about 5 months (around 02/11/2024).  Future Appointments  Date Time Provider Department Center  10/02/2023  9:45 AM Gayland Lauraine PARAS, NP [facility] None  02/10/2024 11:00 AM Oley Bascom RAMAN, NP [facility] None    Rollo ONEIDA Bring, MD, FACOG Obstetrician & Gynecologist, [facility] for [facility], [facility]

## 2023-09-11 NOTE — Progress Notes (Signed)
 Pt is in office for post op, cold knife cone in July. Pt states she is doing well.

## 2023-09-13 ENCOUNTER — Other Ambulatory Visit (HOSPITAL_COMMUNITY): Payer: Self-pay

## 2023-09-14 ENCOUNTER — Other Ambulatory Visit: Payer: Self-pay | Admitting: Neurology

## 2023-09-16 ENCOUNTER — Other Ambulatory Visit: Payer: Self-pay

## 2023-09-16 ENCOUNTER — Other Ambulatory Visit (HOSPITAL_COMMUNITY): Payer: Self-pay

## 2023-09-16 ENCOUNTER — Other Ambulatory Visit: Payer: Self-pay | Admitting: Pharmacy Technician

## 2023-09-16 MED ORDER — FINGOLIMOD HCL 0.5 MG PO CAPS
ORAL_CAPSULE | ORAL | 3 refills | Status: AC
  Filled 2023-09-16: qty 30, 30d supply, fill #0
  Filled 2023-10-14: qty 30, 30d supply, fill #1
  Filled 2023-11-11: qty 30, 30d supply, fill #2
  Filled 2023-12-12: qty 30, 30d supply, fill #3
  Filled 2024-01-15 – 2024-01-21 (×2): qty 30, 30d supply, fill #4
  Filled 2024-02-14 – 2024-02-20 (×2): qty 30, 30d supply, fill #5

## 2023-09-16 NOTE — Progress Notes (Signed)
 Specialty Pharmacy Refill Coordination Note  Kelly Grant is a 58 y.o. female contacted today regarding refills of specialty medication(s) Fingolimod  HCl   Patient requested [name] at [facility] at [facility] date: 09/16/23   Medication will be filled on 09/16/23.

## 2023-10-01 NOTE — Progress Notes (Unsigned)
 ASSESSMENT AND PLAN   Relapsing remitting multiple sclerosis Overall's stable, continue generic Gilenya   Repeat MRI of the brain with without contrast  Laboratory evaluation including CBC with differentiation   Return To Clinic With NP In 6 Months   DIAGNOSTIC DATA (LABS, IMAGING, TESTING) - I reviewed patient records, labs, notes, testing and imaging myself where available.  This MRI of the brain with and without contrast shows the following in August 2023 1.  Multiple T2/FLAIR hyperintense foci in the hemispheres with a couple foci in the infratentorial white matter in a pattern consistent with chronic demyelinating plaque associated with multiple sclerosis.  None of the foci appear to be acute.  They do not enhance.  Compared to the MRI from 08/01/2019, there are no new lesions. 2.  No acute findings.  Incidental small developmental venous anomaly in the right cerebellar hemisphere, also seen on the 2021 MRI is not clinically significant.  HISTORY OF PRESENT ILLNESS: Follow-up for relapsing remitting multiple sclerosis Right optic neuritis in 1980s, then left optic neuritis in May 2006, MRI of the brain showed typical MS lesion  Has been on Gilenya  for many years, currently on disability, had some right shoulder pain recently,  Most recent MRI of the brain with and without contrast was in August 2023, stable multiple T2/FLAIR hyperintensity lesion in both hemisphere, there are also few foci in the infratentorial white matter, no contrast-enhancement, stable compared to previous scan in July 2021  She has some fatigue, but put herself through, denied gait abnormality, right arm limitation due to right shoulder pain  Update 10/02/23 SS: Labs last visit showed AST 88, ALT 54, absolute lymphocyte 0.6. Dr, Onita ordered hepatitis panel, was not completed. Repeat CMP July normal AST, ALT 41.  MRI of the brain in March 2025 showed stable MS lesions.    PHYSICAL EXAM  Vitals:   03/18/23  1023  BP: (!) 153/113  Pulse: 87  Weight: 159 lb (72.1 kg)  Height: 5' 4 (1.626 m)   Body mass index is 27.29 kg/m.   PHYSICAL EXAMNIATION:  Gen: NAD, conversant, well nourised, well groomed                     Cardiovascular: Regular rate rhythm, no peripheral edema, warm, nontender. Eyes: Conjunctivae clear without exudates or hemorrhage Neck: Supple, no carotid bruits. Pulmonary: Clear to auscultation bilaterally   NEUROLOGICAL EXAM:  MENTAL STATUS: Speech/Cognition: Awake, alert, normal speech, oriented to history taking and casual conversation.  CRANIAL NERVES: CN II: Visual fields are full to confrontation.  Pupils are round equal and briskly reactive to light. CN III, IV, VI: extraocular movement are normal. No ptosis. CN V: Facial sensation is intact to light touch. CN VII: Face is symmetric with normal eye closure and smile. CN VIII: Hearing is normal to casual conversation CN IX, X: Palate elevates symmetrically. Phonation is normal. CN XI: Head turning and shoulder shrug are intact CN XII: Tongue is midline with normal movements and no atrophy.  MOTOR: Right shoulder limitation due to right shoulder pain  REFLEXES: Reflexes are 2  and symmetric at the biceps, triceps, knees and ankles. Plantar responses are flexor.  SENSORY: Intact to light touch, pinprick, positional and vibratory sensation at fingers and toes.  COORDINATION: There is no trunk or limb ataxia.    GAIT/STANCE: Posture is normal. Gait is steady     REVIEW OF SYSTEMS: Out of a complete 14 system review of symptoms, the patient complains only  of the following symptoms, and all other reviewed systems are negative.  See HPI  ALLERGIES: Allergies  Allergen Reactions   Macrobid [Nitrofurantoin Macrocrystal] Hives    HOME MEDICATIONS: Outpatient Medications Prior to Visit  Medication Sig Dispense Refill   aspirin  EC 81 MG tablet Take 1 tablet (81 mg total) by mouth daily. 30 tablet 11    Boswellia-Glucosamine-Vit D (OSTEO BI-FLEX ONE PER DAY PO) Take 2 tablets by mouth daily.     calcium -vitamin D  (OSCAL WITH D) 500-200 MG-UNIT tablet Take 2 tablets by mouth in the morning.     carboxymethylcellulose (REFRESH PLUS) 0.5 % SOLN Place 1 drop into both eyes 2 (two) times daily as needed (dry/irritated eyes.).     docusate sodium  (COLACE) 100 MG capsule Take 1 capsule (100 mg total) by mouth 2 (two) times daily. 10 capsule 0   Fingolimod  HCl 0.5 MG CAPS TAKE ONE CAPSULE BY MOUTH ONCE DAILY. MAY TAKE WITH OR WITHOUT FOOD. STORE AT ROOM TEMPERATURE. 90 capsule 3   ibuprofen  (ADVIL ) 600 MG tablet Take 1 tablet (600 mg total) by mouth every 6 (six) hours as needed. 30 tablet 0   meloxicam  (MOBIC ) 15 MG tablet TAKE 1 TABLET(15 MG) BY MOUTH DAILY AS NEEDED FOR PAIN OR INFLAMMATION 30 tablet 0   Multiple Vitamin (MULTIVITAMIN) tablet Take 1 tablet by mouth daily. 30 tablet 3   Omega-3 Fatty Acids (FISH OIL) 1000 MG CAPS Take 1,000 mg by mouth daily.     rosuvastatin  (CRESTOR ) 10 MG tablet TAKE 1 TABLET(10 MG) BY MOUTH AT BEDTIME 90 tablet 1   sennosides-docusate sodium  (SENOKOT-S) 8.6-50 MG tablet Take 2 tablets by mouth daily.     valACYclovir  (VALTREX ) 500 MG tablet TAKE 1 TABLET(500 MG) BY MOUTH DAILY 90 tablet 0   No facility-administered medications prior to visit.    PAST MEDICAL HISTORY: Past Medical History:  Diagnosis Date   Abnormal Pap smear    ASC-H   Arthritis    Chlamydia    History of chicken pox    HSV infection    Multiple sclerosis (HCC)    Neuromuscular disorder (HCC)    MS   Yeast infection     PAST SURGICAL HISTORY: Past Surgical History:  Procedure Laterality Date   CARPAL TUNNEL RELEASE Left    in addition to wrist fracture surgery   CERVICAL CONIZATION W/BX N/A 08/13/2023   Procedure: CONE BIOPSY, CERVIX;  Surgeon: Abigail Rollo DASEN, MD;  Location: [facility] OR;  Service: Gynecology;  Laterality: N/A;   CESAREAN SECTION     x2   COLONOSCOPY  2016    DJ-MAC-movi(exc)-TA   INCISION AND DRAINAGE ABSCESS Left    left buttock   POLYPECTOMY  2016   TA   REVERSE SHOULDER ARTHROPLASTY Right 10/30/2022   Procedure: REVERSE SHOULDER ARTHROPLASTY;  Surgeon: Josefina Chew, MD;  Location: [facility];  Service: Orthopedics;  Laterality: Right;   TUBAL LIGATION      FAMILY HISTORY: Family History  Problem Relation Age of Onset   Colon polyps Mother    Kidney failure Mother    Diabetes Mother    Colon cancer Neg Hx    Esophageal cancer Neg Hx    Rectal cancer Neg Hx    Stomach cancer Neg Hx     SOCIAL HISTORY: Social History   Socioeconomic History   Marital status: Single    Spouse name: Not on file   Number of children: 2   Years of education: College    Highest  education level: Bachelor's degree (e.g., BA, AB, BS)  Occupational History   Occupation: HOUSEKEEPING    Employer: [employer]  Tobacco Use   Smoking status: Former    Current packs/day: 0.00    Types: Cigarettes    Quit date: 01/23/2000    Years since quitting: 23.7   Smokeless tobacco: Never  Vaping Use   Vaping status: Never Used  Substance and Sexual Activity   Alcohol use: Yes    Alcohol/week: 1.0 standard drink of alcohol    Types: 1 Standard drinks or equivalent per week    Comment: rarely   Drug use: No   Sexual activity: Not Currently    Partners: Male    Birth control/protection: Surgical    Comment: tubal ligation per pt  Other Topics Concern   Not on file  Social History Narrative   Patient lives at home with children.    Patient has 2 children.    Patient has a college degree.    Patient is single.    Patient is right handed.    Patient is currently employed.    Social Drivers of Corporate investment banker Strain: Not on file  Food Insecurity: No Food Insecurity (08/08/2023)   Hunger Vital Sign    Worried About Running Out of Food in the Last Year: Never true    Ran Out of Food in the Last Year: Never true  Transportation Needs: No  Transportation Needs (08/08/2023)   PRAPARE - Administrator, Civil Service (Medical): No    Lack of Transportation (Non-Medical): No  Physical Activity: Not on file  Stress: Not on file  Social Connections: Not on file  Intimate Partner Violence: Not on file   Lauraine Born, SCHARLENE, [facility]  [facility] [address] [place], [place] [address] [phone]

## 2023-10-02 ENCOUNTER — Encounter: Payer: Self-pay | Admitting: Neurology

## 2023-10-02 ENCOUNTER — Ambulatory Visit: Payer: Medicaid Other | Admitting: Neurology

## 2023-10-02 VITALS — BP 139/86 | HR 66 | Ht 64.0 in | Wt 157.0 lb

## 2023-10-02 DIAGNOSIS — G35 Multiple sclerosis: Secondary | ICD-10-CM | POA: Diagnosis not present

## 2023-10-02 NOTE — Patient Instructions (Signed)
 Continue fingolimod , check labs today

## 2023-10-03 ENCOUNTER — Ambulatory Visit: Payer: Self-pay | Admitting: Neurology

## 2023-10-03 LAB — CBC WITH DIFFERENTIAL/PLATELET
Basophils Absolute: 0 x10E3/uL (ref 0.0–0.2)
Basos: 0 %
EOS (ABSOLUTE): 0.1 x10E3/uL (ref 0.0–0.4)
Eos: 1 %
Hematocrit: 42.1 % (ref 34.0–46.6)
Hemoglobin: 13.7 g/dL (ref 11.1–15.9)
Immature Grans (Abs): 0 x10E3/uL (ref 0.0–0.1)
Immature Granulocytes: 0 %
Lymphocytes Absolute: 1.1 x10E3/uL (ref 0.7–3.1)
Lymphs: 28 %
MCH: 32.3 pg (ref 26.6–33.0)
MCHC: 32.5 g/dL (ref 31.5–35.7)
MCV: 99 fL — ABNORMAL HIGH (ref 79–97)
Monocytes Absolute: 0.6 x10E3/uL (ref 0.1–0.9)
Monocytes: 15 %
Neutrophils Absolute: 2.1 x10E3/uL (ref 1.4–7.0)
Neutrophils: 56 %
Platelets: 267 x10E3/uL (ref 150–450)
RBC: 4.24 x10E6/uL (ref 3.77–5.28)
RDW: 13.2 % (ref 11.7–15.4)
WBC: 3.9 x10E3/uL (ref 3.4–10.8)

## 2023-10-03 LAB — COMPREHENSIVE METABOLIC PANEL WITH GFR
ALT: 43 IU/L — ABNORMAL HIGH (ref 0–32)
AST: 36 IU/L (ref 0–40)
Albumin: 4.2 g/dL (ref 3.8–4.9)
Alkaline Phosphatase: 97 IU/L (ref 44–121)
BUN/Creatinine Ratio: 18 (ref 9–23)
BUN: 14 mg/dL (ref 6–24)
Bilirubin Total: 0.4 mg/dL (ref 0.0–1.2)
CO2: 21 mmol/L (ref 20–29)
Calcium: 9 mg/dL (ref 8.7–10.2)
Chloride: 106 mmol/L (ref 96–106)
Creatinine, Ser: 0.8 mg/dL (ref 0.57–1.00)
Globulin, Total: 1.9 g/dL (ref 1.5–4.5)
Glucose: 91 mg/dL (ref 70–99)
Potassium: 4 mmol/L (ref 3.5–5.2)
Sodium: 144 mmol/L (ref 134–144)
Total Protein: 6.1 g/dL (ref 6.0–8.5)
eGFR: 85 mL/min/1.73 (ref >59)

## 2023-10-10 ENCOUNTER — Other Ambulatory Visit (HOSPITAL_COMMUNITY): Payer: Self-pay

## 2023-10-14 ENCOUNTER — Other Ambulatory Visit: Payer: Self-pay

## 2023-10-14 ENCOUNTER — Other Ambulatory Visit (HOSPITAL_COMMUNITY): Payer: Self-pay

## 2023-10-14 NOTE — Progress Notes (Signed)
 Specialty Pharmacy Refill Coordination Note  Spoke with Kelly Grant is a 58 y.o. female contacted today regarding refills of specialty medication(s) Fingolimod  HCl  Doses on hand: 10 or more. (Patient unsure at the time of call.)   Patient requested: Pickup at [facility] at [facility] date: 10/15/23  Medication will be filled on 10/14/23.

## 2023-10-22 ENCOUNTER — Other Ambulatory Visit: Payer: Self-pay

## 2023-10-22 NOTE — Progress Notes (Signed)
 Specialty Pharmacy Ongoing Clinical Assessment Note  Kelly Grant is a 58 y.o. female who is being followed by the specialty pharmacy service for RxSp Multiple Sclerosis   Patient's specialty medication(s) reviewed today: Fingolimod  HCl   Missed doses in the last 4 weeks: 0   Patient/Caregiver did not have any additional questions or concerns.   Therapeutic benefit summary: Patient is achieving benefit   Adverse events/side effects summary: No adverse events/side effects   Patient's therapy is appropriate to: Continue    Goals Addressed             This Visit's Progress    Stabilization of disease       Patient is on track. Patient will maintain adherence.  Per visit note from 10/02/23, patient remains clinically stable and MRI from March 2025 showed stable MS lesions.          Follow up: 12 months  [name] Specialty Pharmacist

## 2023-11-07 ENCOUNTER — Other Ambulatory Visit: Payer: Self-pay

## 2023-11-07 DIAGNOSIS — E785 Hyperlipidemia, unspecified: Secondary | ICD-10-CM

## 2023-11-07 MED ORDER — ROSUVASTATIN CALCIUM 10 MG PO TABS: ORAL_TABLET | ORAL | 0 refills | Status: DC

## 2023-11-11 ENCOUNTER — Other Ambulatory Visit (HOSPITAL_COMMUNITY): Payer: Self-pay

## 2023-11-11 ENCOUNTER — Other Ambulatory Visit: Payer: Self-pay

## 2023-11-11 ENCOUNTER — Telehealth: Payer: Self-pay

## 2023-11-11 NOTE — Progress Notes (Signed)
 Specialty Pharmacy Refill Coordination Note  Kelly Grant is a 58 y.o. female contacted today regarding refills of specialty medication(s) Fingolimod  HCl   Patient requested [name] at [facility] at [facility] date: 11/13/23   Medication will be filled on 10.21.25.

## 2023-11-11 NOTE — Telephone Encounter (Signed)
 Copied from CRM #[id number]. 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 Requesting orders, please advise. Also has a form that needs to be filled out once done. Ty # [phone]

## 2023-11-14 NOTE — Telephone Encounter (Signed)
 LMOM on private line with recommendations for pt to make a nurse visit for the TB skin test

## 2023-11-18 ENCOUNTER — Other Ambulatory Visit: Payer: Self-pay | Admitting: Nurse Practitioner

## 2023-11-18 DIAGNOSIS — B009 Herpesviral infection, unspecified: Secondary | ICD-10-CM

## 2023-11-19 NOTE — Telephone Encounter (Signed)
 Please advise [facility]

## 2023-12-12 ENCOUNTER — Other Ambulatory Visit: Payer: Self-pay

## 2023-12-16 ENCOUNTER — Other Ambulatory Visit: Payer: Self-pay

## 2023-12-17 ENCOUNTER — Other Ambulatory Visit: Payer: Self-pay

## 2023-12-17 NOTE — Progress Notes (Signed)
 Specialty Pharmacy Refill Coordination Note  Kelly Grant is a 58 y.o. female contacted today regarding refills of specialty medication(s) Fingolimod  HCl   Patient requested [name] at [facility] at [place] date: 12/18/23   Medication will be filled on: 12/17/23

## 2023-12-23 ENCOUNTER — Other Ambulatory Visit (HOSPITAL_COMMUNITY): Payer: Self-pay

## 2024-01-15 ENCOUNTER — Other Ambulatory Visit: Payer: Self-pay

## 2024-01-17 ENCOUNTER — Other Ambulatory Visit (HOSPITAL_COMMUNITY): Payer: Self-pay

## 2024-01-21 ENCOUNTER — Other Ambulatory Visit: Payer: Self-pay

## 2024-01-21 ENCOUNTER — Other Ambulatory Visit (HOSPITAL_COMMUNITY): Payer: Self-pay

## 2024-01-21 NOTE — Progress Notes (Signed)
 Specialty Pharmacy Refill Coordination Note  Kelly Grant is a 58 y.o. female contacted today regarding refills of specialty medication(s) Fingolimod  HCl   Patient requested [name] at [facility] at [facility] date: 01/22/24   Medication will be filled on: 01/21/24

## 2024-02-10 ENCOUNTER — Encounter: Payer: Self-pay | Admitting: Nurse Practitioner

## 2024-02-10 ENCOUNTER — Ambulatory Visit: Payer: Self-pay | Admitting: Nurse Practitioner

## 2024-02-10 VITALS — BP 147/85 | HR 74 | Temp 97.9°F | Wt 160.0 lb

## 2024-02-10 DIAGNOSIS — E785 Hyperlipidemia, unspecified: Secondary | ICD-10-CM | POA: Diagnosis not present

## 2024-02-10 DIAGNOSIS — R7303 Prediabetes: Secondary | ICD-10-CM

## 2024-02-10 DIAGNOSIS — Z13220 Encounter for screening for lipoid disorders: Secondary | ICD-10-CM

## 2024-02-10 DIAGNOSIS — Z1329 Encounter for screening for other suspected endocrine disorder: Secondary | ICD-10-CM

## 2024-02-10 LAB — POCT GLYCOSYLATED HEMOGLOBIN (HGB A1C): Hemoglobin A1C: 5.6 % (ref 4.0–5.6)

## 2024-02-10 MED ORDER — ROSUVASTATIN CALCIUM 10 MG PO TABS: ORAL_TABLET | ORAL | 0 refills | Status: AC

## 2024-02-10 NOTE — Progress Notes (Signed)
 "  Subjective   Patient ID: Kelly Grant, female    DOB: Jul 28, 1965, 59 y.o.   MRN: [id number]  Chief Complaint  Patient presents with   Follow-up    6 month.     Referring provider: Oley Bascom RAMAN, NP  Kelly Grant is a 59 y.o. female with Past Medical History: No date: Abnormal Pap smear     Comment:  ASC-H No date: Arthritis No date: Chlamydia No date: History of chicken pox No date: HSV infection No date: Multiple sclerosis No date: Neuromuscular disorder (HCC)     Comment:  MS No date: Yeast infection  HPI  Patient presents today for follow-up visit.  Overall she has been doing well.  She does have a history of elevated liver enzymes.  We will recheck labs today. Denies f/c/s, n/v/d, hemoptysis, PND, leg swelling Denies chest pain or edema     Allergies[1]  Immunization History  Administered Date(s) Administered   Influenza,inj,Quad PF,6+ Mos 11/05/2013, 10/08/2014, 11/04/2016, 11/23/2017   Influenza-Unspecified 11/23/2017, 12/12/2023   Moderna Covid-19 Fall Seasonal Vaccine 62yrs & older 12/07/2023   PFIZER(Purple Top)SARS-COV-2 Vaccination 04/07/2019, 04/28/2019, 01/03/2020   Tdap 03/04/2012, 08/05/2013    Tobacco History: Tobacco Use History[2] Counseling given: Not Answered   Outpatient Encounter Medications as of 02/10/2024  Medication Sig   aspirin  EC 81 MG tablet Take 1 tablet (81 mg total) by mouth daily.   calcium -vitamin D  (OSCAL WITH D) 500-200 MG-UNIT tablet Take 2 tablets by mouth in the morning.   carboxymethylcellulose (REFRESH PLUS) 0.5 % SOLN Place 1 drop into both eyes 2 (two) times daily as needed (dry/irritated eyes.).   Fingolimod  HCl 0.5 MG CAPS TAKE ONE CAPSULE BY MOUTH ONCE DAILY. MAY TAKE WITH OR WITHOUT FOOD. STORE AT ROOM TEMPERATURE.   ibuprofen  (ADVIL ) 600 MG tablet Take 1 tablet (600 mg total) by mouth every 6 (six) hours as needed.   Multiple Vitamin (MULTIVITAMIN) tablet Take 1 tablet by mouth daily.   valACYclovir   (VALTREX ) 500 MG tablet TAKE 1 TABLET(500 MG) BY MOUTH DAILY   [DISCONTINUED] rosuvastatin  (CRESTOR ) 10 MG tablet TAKE 1 TABLET(10 MG) BY MOUTH AT BEDTIME   Boswellia-Glucosamine-Vit D (OSTEO BI-FLEX ONE PER DAY PO) Take 2 tablets by mouth daily. (Patient not taking: Reported on 02/10/2024)   [Paused] meloxicam  (MOBIC ) 15 MG tablet TAKE 1 TABLET(15 MG) BY MOUTH DAILY AS NEEDED FOR PAIN OR INFLAMMATION (Patient not taking: Reported on 02/10/2024)   Omega-3 Fatty Acids (FISH OIL) 1000 MG CAPS Take 1,000 mg by mouth daily. (Patient not taking: Reported on 02/10/2024)   rosuvastatin  (CRESTOR ) 10 MG tablet TAKE 1 TABLET(10 MG) BY MOUTH AT BEDTIME   sennosides-docusate sodium  (SENOKOT-S) 8.6-50 MG tablet Take 2 tablets by mouth daily. (Patient not taking: Reported on 02/10/2024)   No facility-administered encounter medications on file as of 02/10/2024.    Review of Systems  Review of Systems  Constitutional: Negative.   HENT: Negative.    Cardiovascular: Negative.   Gastrointestinal: Negative.   Allergic/Immunologic: Negative.   Neurological: Negative.   Psychiatric/Behavioral: Negative.       Objective:   BP (!) 147/85   Pulse 74   Temp 97.9 F (36.6 C) (Temporal)   Wt 160 lb (72.6 kg)   SpO2 94%   BMI 27.46 kg/m   Wt Readings from Last 5 Encounters:  02/10/24 160 lb (72.6 kg)  10/02/23 157 lb (71.2 kg)  09/11/23 163 lb (73.9 kg)  08/13/23 156 lb (70.8 kg)  08/08/23 156 lb  6.4 oz (70.9 kg)     Physical Exam Vitals and nursing note reviewed.  Constitutional:      General: She is not in acute distress.    Appearance: She is well-developed.  Cardiovascular:     Rate and Rhythm: Normal rate and regular rhythm.  Pulmonary:     Effort: Pulmonary effort is normal.     Breath sounds: Normal breath sounds.  Neurological:     Mental Status: She is alert and oriented to person, place, and time.       Assessment & Plan:   Hyperlipidemia, unspecified hyperlipidemia type -      Rosuvastatin  Calcium ; TAKE 1 TABLET(10 MG) BY MOUTH AT BEDTIME  Dispense: 90 tablet; Refill: 0  Prediabetes -     POCT glycosylated hemoglobin (Hb A1C) -     CBC -     Comprehensive metabolic panel with GFR  Lipid screening -     Lipid panel  Thyroid  disorder screen -     TSH     Return in about 6 months (around 08/09/2024).   Bascom GORMAN Borer, NP 02/19/2024      [1]  Allergies Allergen Reactions   Macrobid [Nitrofurantoin Macrocrystal] Hives  [2]  Social History Tobacco Use  Smoking Status Former   Current packs/day: 0.00   Types: Cigarettes   Quit date: 01/23/2000   Years since quitting: 24.0  Smokeless Tobacco Never   "

## 2024-02-11 LAB — CBC
Hematocrit: 41.1 % (ref 34.0–46.6)
Hemoglobin: 13.1 g/dL (ref 11.1–15.9)
MCH: 32.1 pg (ref 26.6–33.0)
MCHC: 31.9 g/dL (ref 31.5–35.7)
MCV: 101 fL — ABNORMAL HIGH (ref 79–97)
Platelets: 260 x10E3/uL (ref 150–450)
RBC: 4.08 x10E6/uL (ref 3.77–5.28)
RDW: 13.1 % (ref 11.7–15.4)
WBC: 3.9 x10E3/uL (ref 3.4–10.8)

## 2024-02-11 LAB — COMPREHENSIVE METABOLIC PANEL WITH GFR
ALT: 30 IU/L (ref 0–32)
AST: 35 IU/L (ref 0–40)
Albumin: 4 g/dL (ref 3.8–4.9)
Alkaline Phosphatase: 90 IU/L (ref 49–135)
BUN/Creatinine Ratio: 16 (ref 9–23)
BUN: 12 mg/dL (ref 6–24)
Bilirubin Total: 0.3 mg/dL (ref 0.0–1.2)
CO2: 23 mmol/L (ref 20–29)
Calcium: 9.7 mg/dL (ref 8.7–10.2)
Chloride: 104 mmol/L (ref 96–106)
Creatinine, Ser: 0.77 mg/dL (ref 0.57–1.00)
Globulin, Total: 1.9 g/dL (ref 1.5–4.5)
Glucose: 94 mg/dL (ref 70–99)
Potassium: 4 mmol/L (ref 3.5–5.2)
Sodium: 140 mmol/L (ref 134–144)
Total Protein: 5.9 g/dL — ABNORMAL LOW (ref 6.0–8.5)
eGFR: 89 mL/min/1.73

## 2024-02-11 LAB — LIPID PANEL
Chol/HDL Ratio: 2.7 ratio (ref 0.0–4.4)
Cholesterol, Total: 156 mg/dL (ref 100–199)
HDL: 57 mg/dL
LDL Chol Calc (NIH): 83 mg/dL (ref 0–99)
Triglycerides: 86 mg/dL (ref 0–149)
VLDL Cholesterol Cal: 16 mg/dL (ref 5–40)

## 2024-02-11 LAB — TSH: TSH: 1.45 u[IU]/mL (ref 0.450–4.500)

## 2024-02-12 ENCOUNTER — Ambulatory Visit: Payer: Self-pay | Admitting: Nurse Practitioner

## 2024-02-14 ENCOUNTER — Other Ambulatory Visit: Payer: Self-pay

## 2024-02-18 ENCOUNTER — Telehealth: Payer: Self-pay | Admitting: *Deleted

## 2024-02-18 ENCOUNTER — Other Ambulatory Visit: Payer: Self-pay

## 2024-02-18 DIAGNOSIS — G35A Relapsing-remitting multiple sclerosis: Secondary | ICD-10-CM

## 2024-02-18 DIAGNOSIS — Z131 Encounter for screening for diabetes mellitus: Secondary | ICD-10-CM

## 2024-02-18 DIAGNOSIS — E785 Hyperlipidemia, unspecified: Secondary | ICD-10-CM

## 2024-02-18 NOTE — Progress Notes (Signed)
 Complex Care Management Note Care Guide Note  02/18/2024 Name: DOMENIQUE QUEST MRN: [id number] DOB: Apr 04, 1965   Complex Care Management Outreach Attempts: An unsuccessful telephone outreach was attempted today to offer the patient information about available complex care management services.  Follow Up Plan:  Additional outreach attempts will be made to offer the patient complex care management information and services.   Encounter Outcome:  No Answer  Harlene Satterfield  [facility]  [facility], [facility] Guide  Direct Dial: [phone]  Fax [phone]

## 2024-02-18 NOTE — Progress Notes (Signed)
 Complex Care Management Note  Care Guide Note 02/18/2024 Name: Kelly Grant MRN: [id number] DOB: [birth year]  Kelly Grant is a 59 y.o. year old female who sees Oley Bascom RAMAN, NP for primary care. I reached out to Kelly Grant by phone today to offer complex care management services.  Ms. Eustache was given information about Complex Care Management services today including:   The Complex Care Management services include support from the care team which includes your Nurse Care Manager, Clinical Social Worker, or Pharmacist.  The Complex Care Management team is here to help remove barriers to the health concerns and goals most important to you. Complex Care Management services are voluntary, and the patient may decline or stop services at any time by request to their care team member.   Complex Care Management Consent Status: Patient did not agree to participate in complex care management services at this time.   Encounter Outcome:  Patient Refused  Harlene Satterfield  [facility]  [facility], [facility] Guide  Direct Dial: [phone]  Fax [phone]

## 2024-02-20 ENCOUNTER — Other Ambulatory Visit: Payer: Self-pay

## 2024-02-20 NOTE — Progress Notes (Signed)
 Specialty Pharmacy Refill Coordination Note  Kelly Grant is a 59 y.o. female contacted today regarding refills of specialty medication(s) Fingolimod  HCl   Patient requested [name] at [facility] date: 02/21/24   Medication will be filled on: 02/21/24

## 2024-03-18 ENCOUNTER — Encounter: Payer: Self-pay | Admitting: Obstetrics and Gynecology

## 2024-05-14 ENCOUNTER — Ambulatory Visit: Admitting: Neurology

## 2024-08-10 ENCOUNTER — Ambulatory Visit: Payer: Self-pay | Admitting: Nurse Practitioner
